# Patient Record
Sex: Male | Born: 1945 | Race: White | Hispanic: No | Marital: Married | Smoking: Former smoker
Health system: Southern US, Community
[De-identification: ages and names within clinical notes are randomized; demographics above are authoritative.]

## PROBLEM LIST (undated history)

## (undated) DIAGNOSIS — M1990 Unspecified osteoarthritis, unspecified site: Secondary | ICD-10-CM

## (undated) DIAGNOSIS — G249 Dystonia, unspecified: Secondary | ICD-10-CM

## (undated) DIAGNOSIS — K2990 Gastroduodenitis, unspecified, without bleeding: Secondary | ICD-10-CM

## (undated) DIAGNOSIS — R972 Elevated prostate specific antigen [PSA]: Secondary | ICD-10-CM

## (undated) DIAGNOSIS — I639 Cerebral infarction, unspecified: Secondary | ICD-10-CM

## (undated) DIAGNOSIS — Z860100 Personal history of colon polyps, unspecified: Secondary | ICD-10-CM

## (undated) DIAGNOSIS — E785 Hyperlipidemia, unspecified: Secondary | ICD-10-CM

## (undated) DIAGNOSIS — R51 Headache: Secondary | ICD-10-CM

## (undated) DIAGNOSIS — I10 Essential (primary) hypertension: Secondary | ICD-10-CM

## (undated) DIAGNOSIS — N10 Acute tubulo-interstitial nephritis: Secondary | ICD-10-CM

## (undated) DIAGNOSIS — F419 Anxiety disorder, unspecified: Secondary | ICD-10-CM

## (undated) DIAGNOSIS — IMO0001 Reserved for inherently not codable concepts without codable children: Secondary | ICD-10-CM

## (undated) DIAGNOSIS — E669 Obesity, unspecified: Secondary | ICD-10-CM

## (undated) DIAGNOSIS — I2510 Atherosclerotic heart disease of native coronary artery without angina pectoris: Secondary | ICD-10-CM

## (undated) DIAGNOSIS — I714 Abdominal aortic aneurysm, without rupture, unspecified: Secondary | ICD-10-CM

## (undated) DIAGNOSIS — D649 Anemia, unspecified: Secondary | ICD-10-CM

## (undated) DIAGNOSIS — K219 Gastro-esophageal reflux disease without esophagitis: Secondary | ICD-10-CM

## (undated) DIAGNOSIS — F4310 Post-traumatic stress disorder, unspecified: Secondary | ICD-10-CM

## (undated) DIAGNOSIS — D696 Thrombocytopenia, unspecified: Secondary | ICD-10-CM

## (undated) DIAGNOSIS — I209 Angina pectoris, unspecified: Secondary | ICD-10-CM

## (undated) DIAGNOSIS — N400 Enlarged prostate without lower urinary tract symptoms: Secondary | ICD-10-CM

## (undated) DIAGNOSIS — A4151 Sepsis due to Escherichia coli [E. coli]: Secondary | ICD-10-CM

## (undated) DIAGNOSIS — C439 Malignant melanoma of skin, unspecified: Secondary | ICD-10-CM

## (undated) DIAGNOSIS — K649 Unspecified hemorrhoids: Secondary | ICD-10-CM

## (undated) DIAGNOSIS — T4145XA Adverse effect of unspecified anesthetic, initial encounter: Secondary | ICD-10-CM

## (undated) DIAGNOSIS — F32A Depression, unspecified: Secondary | ICD-10-CM

## (undated) DIAGNOSIS — K635 Polyp of colon: Secondary | ICD-10-CM

## (undated) DIAGNOSIS — J189 Pneumonia, unspecified organism: Secondary | ICD-10-CM

## (undated) DIAGNOSIS — R569 Unspecified convulsions: Secondary | ICD-10-CM

## (undated) DIAGNOSIS — K269 Duodenal ulcer, unspecified as acute or chronic, without hemorrhage or perforation: Secondary | ICD-10-CM

## (undated) DIAGNOSIS — K760 Fatty (change of) liver, not elsewhere classified: Secondary | ICD-10-CM

## (undated) DIAGNOSIS — Z5189 Encounter for other specified aftercare: Secondary | ICD-10-CM

## (undated) DIAGNOSIS — M79606 Pain in leg, unspecified: Secondary | ICD-10-CM

## (undated) DIAGNOSIS — S0990XA Unspecified injury of head, initial encounter: Secondary | ICD-10-CM

## (undated) DIAGNOSIS — K8070 Calculus of gallbladder and bile duct without cholecystitis without obstruction: Secondary | ICD-10-CM

## (undated) DIAGNOSIS — K648 Other hemorrhoids: Secondary | ICD-10-CM

## (undated) DIAGNOSIS — E538 Deficiency of other specified B group vitamins: Secondary | ICD-10-CM

## (undated) DIAGNOSIS — K449 Diaphragmatic hernia without obstruction or gangrene: Secondary | ICD-10-CM

## (undated) DIAGNOSIS — G459 Transient cerebral ischemic attack, unspecified: Secondary | ICD-10-CM

## (undated) DIAGNOSIS — J449 Chronic obstructive pulmonary disease, unspecified: Secondary | ICD-10-CM

## (undated) DIAGNOSIS — G8929 Other chronic pain: Secondary | ICD-10-CM

## (undated) DIAGNOSIS — Z8709 Personal history of other diseases of the respiratory system: Secondary | ICD-10-CM

## (undated) DIAGNOSIS — F329 Major depressive disorder, single episode, unspecified: Secondary | ICD-10-CM

## (undated) DIAGNOSIS — T8859XA Other complications of anesthesia, initial encounter: Secondary | ICD-10-CM

## (undated) DIAGNOSIS — I6529 Occlusion and stenosis of unspecified carotid artery: Secondary | ICD-10-CM

## (undated) DIAGNOSIS — K589 Irritable bowel syndrome without diarrhea: Secondary | ICD-10-CM

## (undated) HISTORY — DX: Anemia, unspecified: D64.9

## (undated) HISTORY — DX: Elevated prostate specific antigen (PSA): R97.20

## (undated) HISTORY — DX: Personal history of colon polyps, unspecified: Z86.0100

## (undated) HISTORY — DX: Atherosclerotic heart disease of native coronary artery without angina pectoris: I25.10

## (undated) HISTORY — DX: Pain in leg, unspecified: M79.606

## (undated) HISTORY — DX: Unspecified hemorrhoids: K64.9

## (undated) HISTORY — DX: Abdominal aortic aneurysm, without rupture, unspecified: I71.40

## (undated) HISTORY — DX: Benign prostatic hyperplasia without lower urinary tract symptoms: N40.0

## (undated) HISTORY — DX: Post-traumatic stress disorder, unspecified: F43.10

## (undated) HISTORY — PX: LEG SURGERY: SHX1003

## (undated) HISTORY — PX: COLONOSCOPY: SHX174

## (undated) HISTORY — DX: Essential (primary) hypertension: I10

## (undated) HISTORY — DX: Major depressive disorder, single episode, unspecified: F32.9

## (undated) HISTORY — DX: Acute pyelonephritis: N10

## (undated) HISTORY — DX: Unspecified injury of head, initial encounter: S09.90XA

## (undated) HISTORY — PX: OTHER SURGICAL HISTORY: SHX169

## (undated) HISTORY — DX: Sepsis due to Escherichia coli (e. coli): A41.51

## (undated) HISTORY — DX: Duodenal ulcer, unspecified as acute or chronic, without hemorrhage or perforation: K26.9

## (undated) HISTORY — DX: Calculus of gallbladder and bile duct without cholecystitis without obstruction: K80.70

## (undated) HISTORY — DX: Dystonia, unspecified: G24.9

## (undated) HISTORY — DX: Personal history of colonic polyps: Z86.010

## (undated) HISTORY — PX: CARDIAC SURGERY: SHX584

## (undated) HISTORY — DX: Fatty (change of) liver, not elsewhere classified: K76.0

## (undated) HISTORY — DX: Gastroduodenitis, unspecified, without bleeding: K29.90

## (undated) HISTORY — DX: Hyperlipidemia, unspecified: E78.5

## (undated) HISTORY — DX: Other chronic pain: G89.29

## (undated) HISTORY — DX: Cerebral infarction, unspecified: I63.9

## (undated) HISTORY — DX: Gastro-esophageal reflux disease without esophagitis: K21.9

## (undated) HISTORY — DX: Abdominal aortic aneurysm, without rupture: I71.4

## (undated) HISTORY — DX: Depression, unspecified: F32.A

## (undated) HISTORY — DX: Polyp of colon: K63.5

## (undated) HISTORY — DX: Other hemorrhoids: K64.8

## (undated) HISTORY — DX: Irritable bowel syndrome without diarrhea: K58.9

## (undated) HISTORY — DX: Diaphragmatic hernia without obstruction or gangrene: K44.9

## (undated) HISTORY — DX: Deficiency of other specified B group vitamins: E53.8

## (undated) HISTORY — PX: SKIN CANCER EXCISION: SHX779

## (undated) HISTORY — DX: Occlusion and stenosis of unspecified carotid artery: I65.29

## (undated) HISTORY — PX: ESOPHAGOGASTRODUODENOSCOPY: SHX1529

## (undated) HISTORY — DX: Obesity, unspecified: E66.9

## (undated) HISTORY — DX: Transient cerebral ischemic attack, unspecified: G45.9

## (undated) HISTORY — PX: CHOLECYSTECTOMY: SHX55

## (undated) HISTORY — DX: Malignant melanoma of skin, unspecified: C43.9

## (undated) HISTORY — DX: Thrombocytopenia, unspecified: D69.6

## (undated) HISTORY — DX: Unspecified convulsions: R56.9

## (undated) HISTORY — DX: Chronic obstructive pulmonary disease, unspecified: J44.9

---

## 1964-10-04 DIAGNOSIS — S0990XA Unspecified injury of head, initial encounter: Secondary | ICD-10-CM

## 1964-10-04 HISTORY — DX: Unspecified injury of head, initial encounter: S09.90XA

## 1998-07-25 ENCOUNTER — Emergency Department (HOSPITAL_COMMUNITY): Admission: EM | Admit: 1998-07-25 | Discharge: 1998-07-25 | Payer: Self-pay | Admitting: Emergency Medicine

## 1998-07-25 ENCOUNTER — Encounter: Payer: Self-pay | Admitting: Emergency Medicine

## 1999-01-25 ENCOUNTER — Emergency Department (HOSPITAL_COMMUNITY): Admission: EM | Admit: 1999-01-25 | Discharge: 1999-01-25 | Payer: Self-pay

## 1999-01-29 ENCOUNTER — Inpatient Hospital Stay (HOSPITAL_COMMUNITY): Admission: AD | Admit: 1999-01-29 | Discharge: 1999-02-03 | Payer: Self-pay | Admitting: Internal Medicine

## 1999-01-30 ENCOUNTER — Encounter: Payer: Self-pay | Admitting: Internal Medicine

## 1999-01-30 HISTORY — PX: ERCP: SHX60

## 1999-04-22 ENCOUNTER — Ambulatory Visit (HOSPITAL_COMMUNITY): Admission: RE | Admit: 1999-04-22 | Discharge: 1999-04-22 | Payer: Self-pay | Admitting: Neurosurgery

## 1999-05-12 ENCOUNTER — Inpatient Hospital Stay (HOSPITAL_COMMUNITY): Admission: RE | Admit: 1999-05-12 | Discharge: 1999-05-13 | Payer: Self-pay | Admitting: Neurosurgery

## 1999-07-08 ENCOUNTER — Emergency Department (HOSPITAL_COMMUNITY): Admission: EM | Admit: 1999-07-08 | Discharge: 1999-07-08 | Payer: Self-pay | Admitting: Emergency Medicine

## 1999-07-08 ENCOUNTER — Encounter: Payer: Self-pay | Admitting: Emergency Medicine

## 2003-09-27 ENCOUNTER — Inpatient Hospital Stay (HOSPITAL_COMMUNITY): Admission: EM | Admit: 2003-09-27 | Discharge: 2003-10-02 | Payer: Self-pay | Admitting: Emergency Medicine

## 2003-10-01 ENCOUNTER — Encounter (INDEPENDENT_AMBULATORY_CARE_PROVIDER_SITE_OTHER): Payer: Self-pay | Admitting: Cardiology

## 2003-10-23 ENCOUNTER — Inpatient Hospital Stay (HOSPITAL_COMMUNITY): Admission: EM | Admit: 2003-10-23 | Discharge: 2003-10-24 | Payer: Self-pay | Admitting: Emergency Medicine

## 2003-10-31 ENCOUNTER — Ambulatory Visit (HOSPITAL_COMMUNITY): Admission: RE | Admit: 2003-10-31 | Discharge: 2003-10-31 | Payer: Self-pay | Admitting: *Deleted

## 2004-01-28 ENCOUNTER — Inpatient Hospital Stay (HOSPITAL_COMMUNITY): Admission: EM | Admit: 2004-01-28 | Discharge: 2004-01-29 | Payer: Self-pay | Admitting: Emergency Medicine

## 2004-05-23 ENCOUNTER — Emergency Department (HOSPITAL_COMMUNITY): Admission: EM | Admit: 2004-05-23 | Discharge: 2004-05-24 | Payer: Self-pay | Admitting: Emergency Medicine

## 2005-06-04 ENCOUNTER — Emergency Department (HOSPITAL_COMMUNITY): Admission: EM | Admit: 2005-06-04 | Discharge: 2005-06-04 | Payer: Self-pay | Admitting: Emergency Medicine

## 2005-09-02 ENCOUNTER — Emergency Department (HOSPITAL_COMMUNITY): Admission: EM | Admit: 2005-09-02 | Discharge: 2005-09-02 | Payer: Self-pay | Admitting: Emergency Medicine

## 2006-05-24 ENCOUNTER — Ambulatory Visit: Payer: Self-pay | Admitting: Family Medicine

## 2006-05-24 ENCOUNTER — Inpatient Hospital Stay (HOSPITAL_COMMUNITY): Admission: EM | Admit: 2006-05-24 | Discharge: 2006-05-26 | Payer: Self-pay | Admitting: Emergency Medicine

## 2006-05-24 ENCOUNTER — Ambulatory Visit: Payer: Self-pay | Admitting: Cardiovascular Disease

## 2006-05-25 ENCOUNTER — Encounter: Payer: Self-pay | Admitting: Cardiology

## 2007-05-21 ENCOUNTER — Emergency Department (HOSPITAL_COMMUNITY): Admission: EM | Admit: 2007-05-21 | Discharge: 2007-05-21 | Payer: Self-pay | Admitting: Emergency Medicine

## 2007-06-11 ENCOUNTER — Ambulatory Visit: Payer: Self-pay | Admitting: Cardiology

## 2007-06-12 ENCOUNTER — Ambulatory Visit: Payer: Self-pay | Admitting: Cardiology

## 2007-06-12 ENCOUNTER — Inpatient Hospital Stay (HOSPITAL_COMMUNITY): Admission: EM | Admit: 2007-06-12 | Discharge: 2007-06-16 | Payer: Self-pay | Admitting: Emergency Medicine

## 2007-06-13 ENCOUNTER — Encounter (INDEPENDENT_AMBULATORY_CARE_PROVIDER_SITE_OTHER): Payer: Self-pay | Admitting: Internal Medicine

## 2007-06-22 ENCOUNTER — Ambulatory Visit: Payer: Self-pay | Admitting: Family Medicine

## 2007-06-22 DIAGNOSIS — K7689 Other specified diseases of liver: Secondary | ICD-10-CM | POA: Insufficient documentation

## 2007-06-22 DIAGNOSIS — I10 Essential (primary) hypertension: Secondary | ICD-10-CM | POA: Insufficient documentation

## 2007-06-22 DIAGNOSIS — S0990XA Unspecified injury of head, initial encounter: Secondary | ICD-10-CM | POA: Insufficient documentation

## 2007-06-22 DIAGNOSIS — Z87440 Personal history of urinary (tract) infections: Secondary | ICD-10-CM | POA: Insufficient documentation

## 2007-06-22 DIAGNOSIS — E785 Hyperlipidemia, unspecified: Secondary | ICD-10-CM | POA: Insufficient documentation

## 2007-06-22 DIAGNOSIS — I714 Abdominal aortic aneurysm, without rupture, unspecified: Secondary | ICD-10-CM | POA: Insufficient documentation

## 2007-06-22 DIAGNOSIS — Z8679 Personal history of other diseases of the circulatory system: Secondary | ICD-10-CM | POA: Insufficient documentation

## 2007-06-22 DIAGNOSIS — K219 Gastro-esophageal reflux disease without esophagitis: Secondary | ICD-10-CM

## 2007-06-22 DIAGNOSIS — D696 Thrombocytopenia, unspecified: Secondary | ICD-10-CM

## 2007-06-22 DIAGNOSIS — R972 Elevated prostate specific antigen [PSA]: Secondary | ICD-10-CM

## 2007-06-27 ENCOUNTER — Encounter: Payer: Self-pay | Admitting: Internal Medicine

## 2007-06-29 LAB — CONVERTED CEMR LAB
Basophils Relative: 0.9 % (ref 0.0–1.0)
Bilirubin, Direct: 0.1 mg/dL (ref 0.0–0.3)
CO2: 28 meq/L (ref 19–32)
Eosinophils Absolute: 0.1 10*3/uL (ref 0.0–0.6)
Eosinophils Relative: 0.9 % (ref 0.0–5.0)
GFR calc Af Amer: 66 mL/min
GFR calc non Af Amer: 55 mL/min
Glucose, Bld: 74 mg/dL (ref 70–99)
Hemoglobin: 11.5 g/dL — ABNORMAL LOW (ref 13.0–17.0)
Lymphocytes Relative: 18.8 % (ref 12.0–46.0)
MCV: 88.8 fL (ref 78.0–100.0)
Monocytes Absolute: 0.4 10*3/uL (ref 0.2–0.7)
Monocytes Relative: 3.6 % (ref 3.0–11.0)
Neutro Abs: 8.4 10*3/uL — ABNORMAL HIGH (ref 1.4–7.7)
Platelets: 362 10*3/uL (ref 150–400)
Potassium: 5.1 meq/L (ref 3.5–5.1)
Sodium: 144 meq/L (ref 135–145)
Total Protein: 6.3 g/dL (ref 6.0–8.3)
WBC: 11.1 10*3/uL — ABNORMAL HIGH (ref 4.5–10.5)

## 2007-06-30 ENCOUNTER — Encounter: Admission: RE | Admit: 2007-06-30 | Discharge: 2007-06-30 | Payer: Self-pay | Admitting: Family Medicine

## 2007-07-04 ENCOUNTER — Encounter (INDEPENDENT_AMBULATORY_CARE_PROVIDER_SITE_OTHER): Payer: Self-pay | Admitting: *Deleted

## 2007-07-04 ENCOUNTER — Ambulatory Visit: Payer: Self-pay | Admitting: Cardiology

## 2007-07-05 ENCOUNTER — Telehealth: Payer: Self-pay | Admitting: Family Medicine

## 2007-08-07 ENCOUNTER — Ambulatory Visit: Payer: Self-pay

## 2007-08-07 ENCOUNTER — Encounter: Payer: Self-pay | Admitting: Family Medicine

## 2008-02-24 ENCOUNTER — Observation Stay (HOSPITAL_COMMUNITY): Admission: EM | Admit: 2008-02-24 | Discharge: 2008-02-25 | Payer: Self-pay | Admitting: Emergency Medicine

## 2008-02-24 ENCOUNTER — Ambulatory Visit: Payer: Self-pay | Admitting: Internal Medicine

## 2008-02-24 DIAGNOSIS — Z860100 Personal history of colon polyps, unspecified: Secondary | ICD-10-CM | POA: Insufficient documentation

## 2008-02-24 DIAGNOSIS — D500 Iron deficiency anemia secondary to blood loss (chronic): Secondary | ICD-10-CM | POA: Insufficient documentation

## 2008-02-24 DIAGNOSIS — R5383 Other fatigue: Secondary | ICD-10-CM

## 2008-02-24 DIAGNOSIS — R5381 Other malaise: Secondary | ICD-10-CM | POA: Insufficient documentation

## 2008-02-24 DIAGNOSIS — F4310 Post-traumatic stress disorder, unspecified: Secondary | ICD-10-CM

## 2008-02-24 DIAGNOSIS — R269 Unspecified abnormalities of gait and mobility: Secondary | ICD-10-CM

## 2008-02-25 ENCOUNTER — Encounter: Payer: Self-pay | Admitting: Internal Medicine

## 2008-02-27 ENCOUNTER — Ambulatory Visit: Payer: Self-pay | Admitting: Internal Medicine

## 2008-02-28 ENCOUNTER — Encounter: Payer: Self-pay | Admitting: Internal Medicine

## 2008-03-01 ENCOUNTER — Ambulatory Visit: Payer: Self-pay | Admitting: Internal Medicine

## 2008-03-05 ENCOUNTER — Ambulatory Visit: Payer: Self-pay | Admitting: Internal Medicine

## 2008-03-05 ENCOUNTER — Encounter: Payer: Self-pay | Admitting: Internal Medicine

## 2008-03-10 ENCOUNTER — Encounter: Payer: Self-pay | Admitting: Internal Medicine

## 2008-04-02 ENCOUNTER — Ambulatory Visit: Payer: Self-pay | Admitting: Family Medicine

## 2008-04-02 DIAGNOSIS — D649 Anemia, unspecified: Secondary | ICD-10-CM

## 2008-04-11 ENCOUNTER — Encounter (INDEPENDENT_AMBULATORY_CARE_PROVIDER_SITE_OTHER): Payer: Self-pay | Admitting: *Deleted

## 2008-04-11 LAB — CONVERTED CEMR LAB
BUN: 17 mg/dL (ref 6–23)
Basophils Relative: 1.3 % — ABNORMAL HIGH (ref 0.0–1.0)
CO2: 26 meq/L (ref 19–32)
Chloride: 105 meq/L (ref 96–112)
Eosinophils Relative: 2.8 % (ref 0.0–5.0)
Ferritin: 30 ng/mL (ref 22.0–322.0)
GFR calc non Af Amer: 54 mL/min
HCT: 38.8 % — ABNORMAL LOW (ref 39.0–52.0)
Hemoglobin: 12.8 g/dL — ABNORMAL LOW (ref 13.0–17.0)
Lymphocytes Relative: 24.4 % (ref 12.0–46.0)
Monocytes Absolute: 0.6 10*3/uL (ref 0.1–1.0)
Monocytes Relative: 7.2 % (ref 3.0–12.0)
Neutro Abs: 5.8 10*3/uL (ref 1.4–7.7)
PSA: 1.27 ng/mL (ref 0.10–4.00)
Potassium: 4.6 meq/L (ref 3.5–5.1)
RBC: 5 M/uL (ref 4.22–5.81)
RDW: 29.9 % — ABNORMAL HIGH (ref 11.5–14.6)
Saturation Ratios: 24.4 % (ref 20.0–50.0)
Vitamin B-12: 258 pg/mL (ref 211–911)
WBC: 8.9 10*3/uL (ref 4.5–10.5)

## 2008-04-16 ENCOUNTER — Telehealth (INDEPENDENT_AMBULATORY_CARE_PROVIDER_SITE_OTHER): Payer: Self-pay | Admitting: *Deleted

## 2008-04-17 ENCOUNTER — Telehealth (INDEPENDENT_AMBULATORY_CARE_PROVIDER_SITE_OTHER): Payer: Self-pay | Admitting: *Deleted

## 2008-04-18 ENCOUNTER — Ambulatory Visit: Payer: Self-pay | Admitting: Family Medicine

## 2008-04-30 ENCOUNTER — Telehealth (INDEPENDENT_AMBULATORY_CARE_PROVIDER_SITE_OTHER): Payer: Self-pay | Admitting: *Deleted

## 2008-04-30 LAB — CONVERTED CEMR LAB
ALT: 17 units/L (ref 0–53)
AST: 16 units/L (ref 0–37)
Alkaline Phosphatase: 61 units/L (ref 39–117)
Basophils Absolute: 0 10*3/uL (ref 0.0–0.1)
Basophils Relative: 0.1 % (ref 0.0–3.0)
Bilirubin, Direct: 0.1 mg/dL (ref 0.0–0.3)
CO2: 27 meq/L (ref 19–32)
Chloride: 98 meq/L (ref 96–112)
Lymphocytes Relative: 10.8 % — ABNORMAL LOW (ref 12.0–46.0)
MCHC: 33.3 g/dL (ref 30.0–36.0)
Monocytes Relative: 7.8 % (ref 3.0–12.0)
Neutrophils Relative %: 79.2 % — ABNORMAL HIGH (ref 43.0–77.0)
RBC: 4.61 M/uL (ref 4.22–5.81)
RDW: 28.2 % — ABNORMAL HIGH (ref 11.5–14.6)
Sodium: 134 meq/L — ABNORMAL LOW (ref 135–145)
Total Bilirubin: 0.5 mg/dL (ref 0.3–1.2)

## 2008-05-16 ENCOUNTER — Ambulatory Visit: Payer: Self-pay | Admitting: Family Medicine

## 2008-05-17 ENCOUNTER — Encounter (INDEPENDENT_AMBULATORY_CARE_PROVIDER_SITE_OTHER): Payer: Self-pay | Admitting: *Deleted

## 2008-05-17 ENCOUNTER — Ambulatory Visit: Payer: Self-pay | Admitting: Family Medicine

## 2008-05-17 DIAGNOSIS — E1169 Type 2 diabetes mellitus with other specified complication: Secondary | ICD-10-CM | POA: Insufficient documentation

## 2008-05-17 DIAGNOSIS — E119 Type 2 diabetes mellitus without complications: Secondary | ICD-10-CM | POA: Insufficient documentation

## 2008-05-17 LAB — CONVERTED CEMR LAB
BUN: 12 mg/dL (ref 6–23)
Chloride: 107 meq/L (ref 96–112)
Eosinophils Relative: 3 % (ref 0.0–5.0)
GFR calc non Af Amer: 65 mL/min
Glucose, Bld: 240 mg/dL — ABNORMAL HIGH (ref 70–99)
HCT: 38.9 % — ABNORMAL LOW (ref 39.0–52.0)
MCHC: 33.1 g/dL (ref 30.0–36.0)
MCV: 83.7 fL (ref 78.0–100.0)
Platelets: 201 10*3/uL (ref 150–400)
Potassium: 4 meq/L (ref 3.5–5.1)
RDW: 23.8 % — ABNORMAL HIGH (ref 11.5–14.6)

## 2008-05-20 ENCOUNTER — Encounter (INDEPENDENT_AMBULATORY_CARE_PROVIDER_SITE_OTHER): Payer: Self-pay | Admitting: *Deleted

## 2008-05-20 ENCOUNTER — Telehealth (INDEPENDENT_AMBULATORY_CARE_PROVIDER_SITE_OTHER): Payer: Self-pay | Admitting: *Deleted

## 2008-05-21 ENCOUNTER — Encounter: Payer: Self-pay | Admitting: Family Medicine

## 2008-06-24 ENCOUNTER — Ambulatory Visit: Payer: Self-pay | Admitting: Internal Medicine

## 2008-07-08 ENCOUNTER — Encounter: Payer: Self-pay | Admitting: Family Medicine

## 2008-10-04 DIAGNOSIS — K269 Duodenal ulcer, unspecified as acute or chronic, without hemorrhage or perforation: Secondary | ICD-10-CM

## 2008-10-04 HISTORY — DX: Duodenal ulcer, unspecified as acute or chronic, without hemorrhage or perforation: K26.9

## 2008-10-14 ENCOUNTER — Ambulatory Visit: Payer: Self-pay | Admitting: Internal Medicine

## 2008-10-14 ENCOUNTER — Inpatient Hospital Stay (HOSPITAL_COMMUNITY): Admission: EM | Admit: 2008-10-14 | Discharge: 2008-10-17 | Payer: Self-pay | Admitting: Emergency Medicine

## 2008-10-14 ENCOUNTER — Ambulatory Visit: Payer: Self-pay | Admitting: Cardiology

## 2008-10-15 ENCOUNTER — Ambulatory Visit: Payer: Self-pay | Admitting: Internal Medicine

## 2008-10-15 ENCOUNTER — Encounter: Payer: Self-pay | Admitting: Internal Medicine

## 2008-10-16 ENCOUNTER — Ambulatory Visit: Payer: Self-pay | Admitting: Vascular Surgery

## 2008-10-16 ENCOUNTER — Encounter: Payer: Self-pay | Admitting: Internal Medicine

## 2008-10-21 ENCOUNTER — Telehealth: Payer: Self-pay | Admitting: Family Medicine

## 2008-10-22 IMAGING — CT CT HEAD W/O CM
1 of 2 series · 13 of 30 positions shown, 17 images · IV contrast (agent unspecified)
Comparison: 05/24/06.

CLINICAL DATA: Short of breath.  Fever.  Headaches. 
 HEAD CT WITHOUT CONTRAST:
TECHNIQUE: Contiguous axial images were obtained from the base of the skull through the vertex according to standard protocol without contrast.

[Series 2: brain · axial · 0.47mm/px · z∈[+177,+315]mm · 13 of 44 slices shown, 17 images]
[im 4/44  brain]
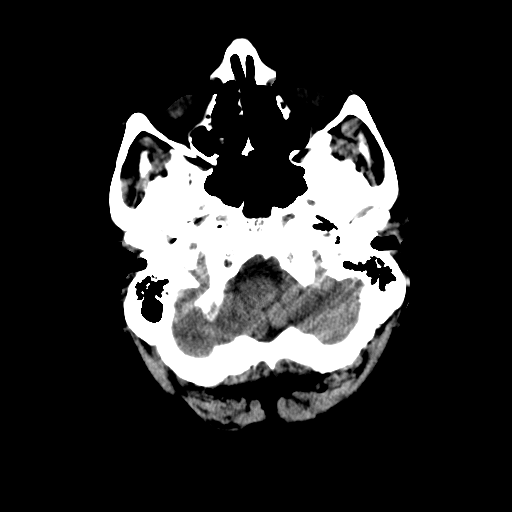
[im 4/44  bone]
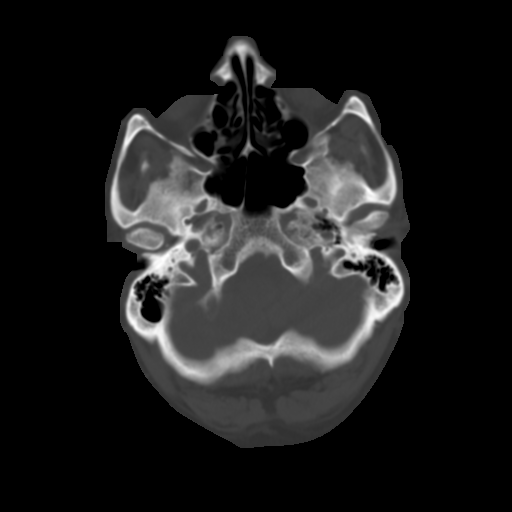
[im 7/44  brain]
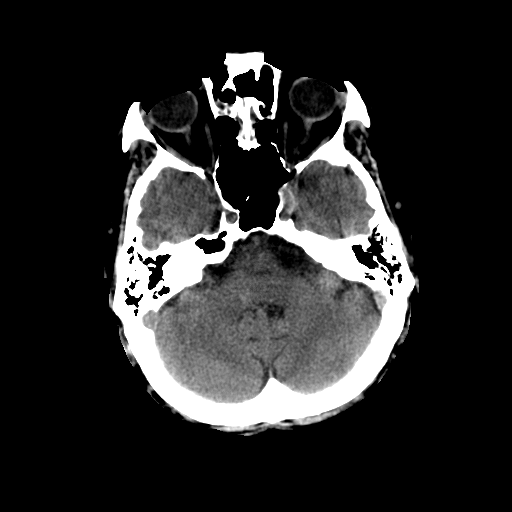
[im 10/44  brain]
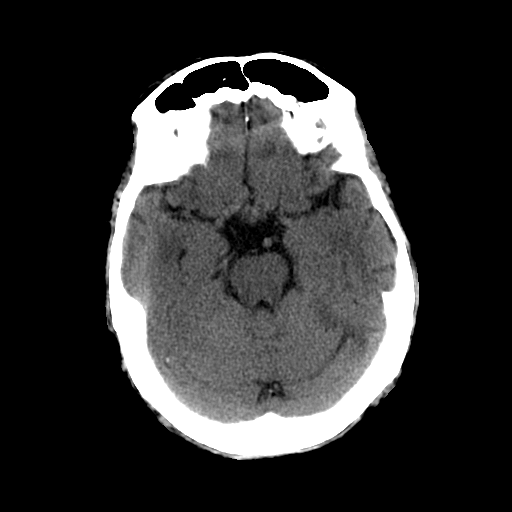
[im 13/44  brain]
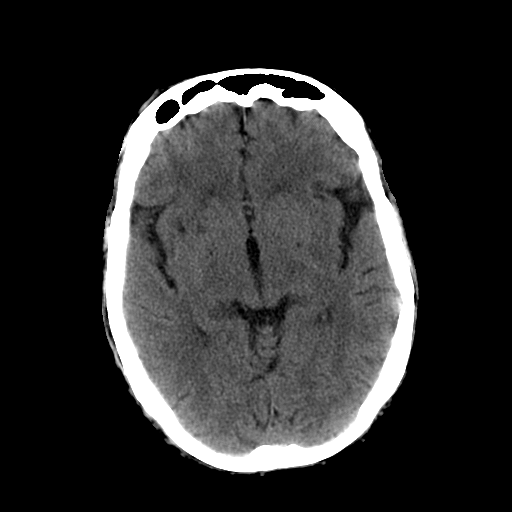
[im 16/44  brain]
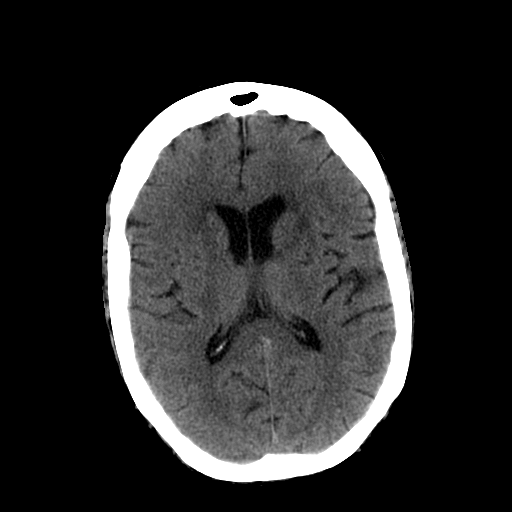
[im 16/44  bone]
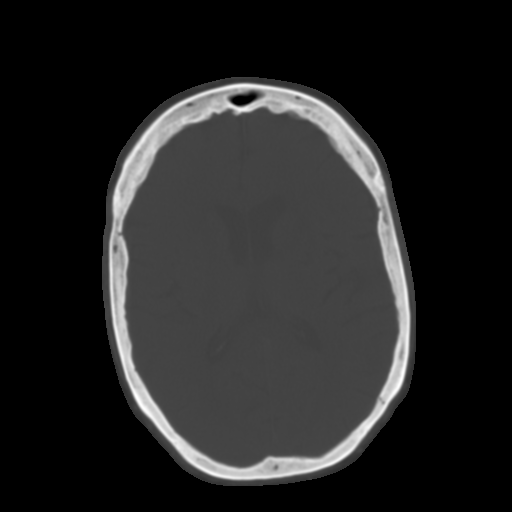
[im 19/44  brain]
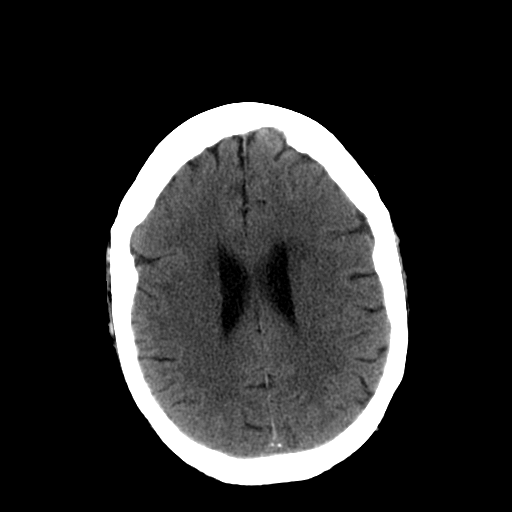
[im 22/44  brain]
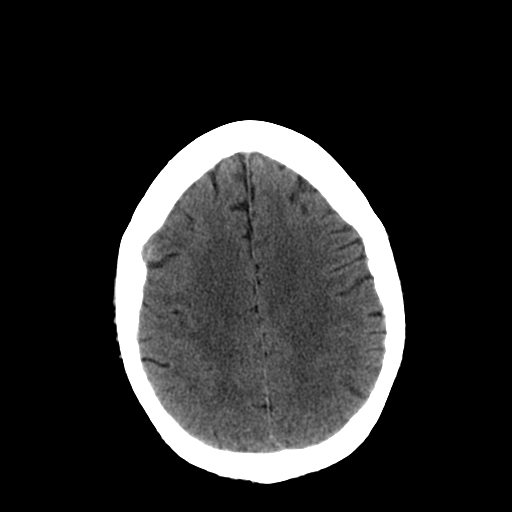
[im 25/44  brain]
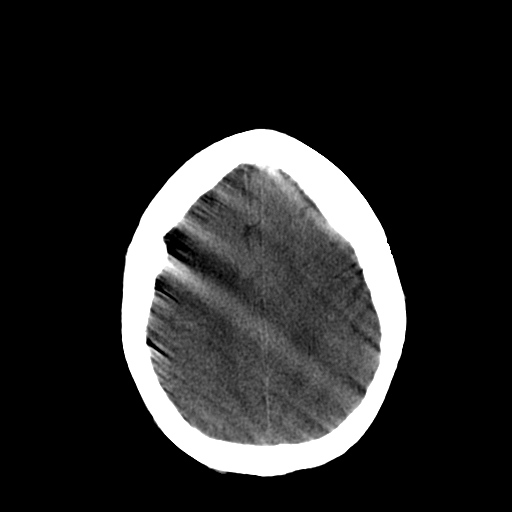
[im 28/44  brain]
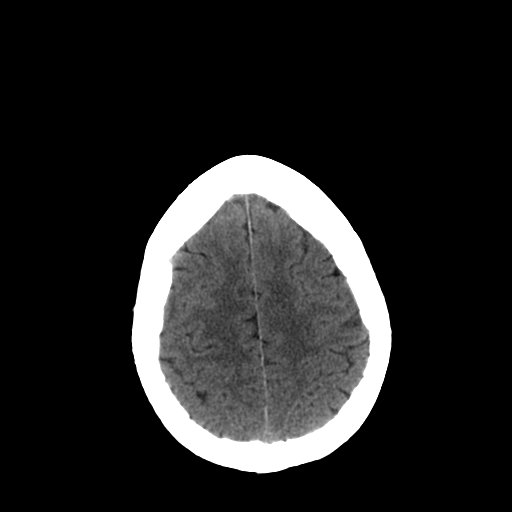
[im 28/44  bone]
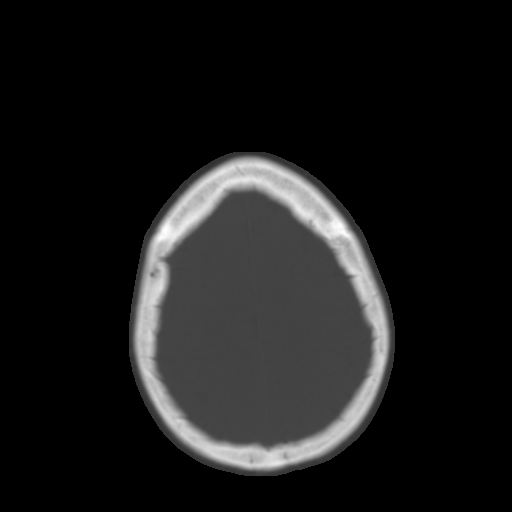
[im 31/44  brain]
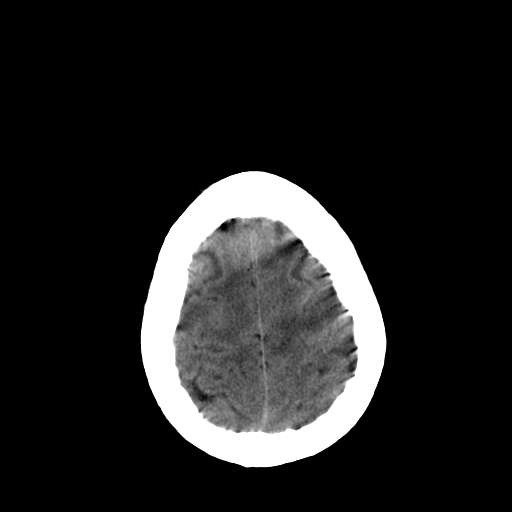
[im 34/44  brain]
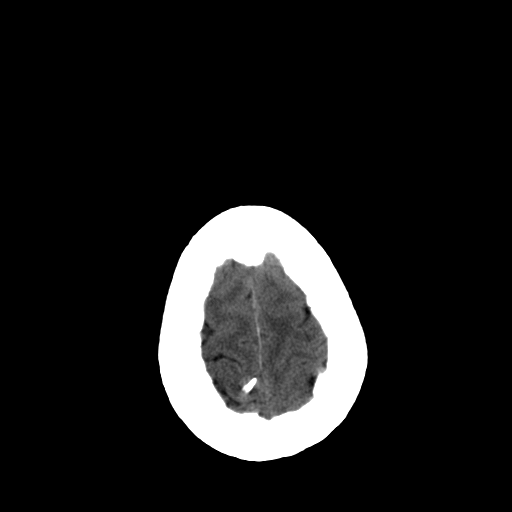
[im 37/44  brain]
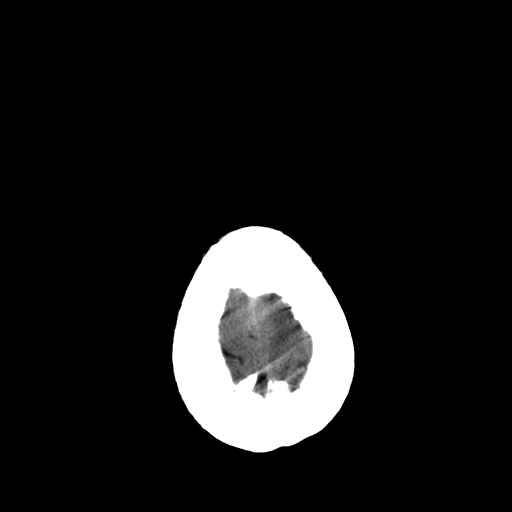
[im 40/44  brain]
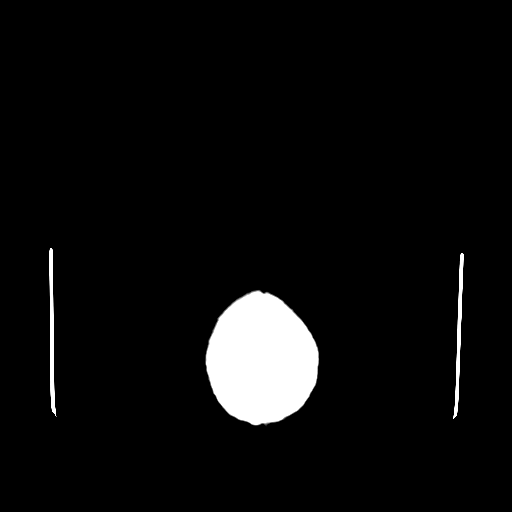
[im 40/44  bone]
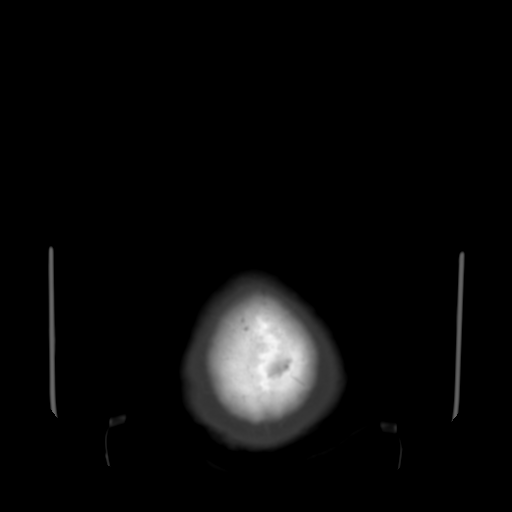

[13 of 30 positions shown; findings below may reference images not displayed]

FINDINGS: There is no sign of acute stroke, mass, hemorrhage, hydrocephalus, or extraaxial collection.  Extensive chronic small vessel changes are seen within the deep white matter and basal ganglia.  The calvarium is unremarkable.  There is minor inflammatory disease in the ethmoid sinuses.
IMPRESSION: Chronic small vessel changes.  No acute findings.

## 2008-10-28 ENCOUNTER — Ambulatory Visit: Payer: Self-pay | Admitting: Family Medicine

## 2008-10-28 DIAGNOSIS — I6350 Cerebral infarction due to unspecified occlusion or stenosis of unspecified cerebral artery: Secondary | ICD-10-CM | POA: Insufficient documentation

## 2008-11-12 LAB — CONVERTED CEMR LAB
ALT: 13 units/L (ref 0–53)
AST: 13 units/L (ref 0–37)
Albumin: 4 g/dL (ref 3.5–5.2)
Alkaline Phosphatase: 61 units/L (ref 39–117)
Basophils Absolute: 0.1 10*3/uL (ref 0.0–0.1)
Bilirubin, Direct: 0.1 mg/dL (ref 0.0–0.3)
Calcium: 9.3 mg/dL (ref 8.4–10.5)
Chloride: 108 meq/L (ref 96–112)
Creatinine, Ser: 1.1 mg/dL (ref 0.4–1.5)
Eosinophils Absolute: 0.2 10*3/uL (ref 0.0–0.7)
Folate: 10.8 ng/mL
HCT: 35.7 % — ABNORMAL LOW (ref 39.0–52.0)
HDL: 31.1 mg/dL — ABNORMAL LOW (ref >39.0)
Monocytes Absolute: 0.6 10*3/uL (ref 0.1–1.0)
Monocytes Relative: 9.6 % (ref 3.0–12.0)
Platelets: 208 10*3/uL (ref 150–400)
RDW: 16.3 % — ABNORMAL HIGH (ref 11.5–14.6)
Total CHOL/HDL Ratio: 5.9
Total Protein: 6.4 g/dL (ref 6.0–8.3)
Triglycerides: 173 mg/dL — ABNORMAL HIGH (ref 0–149)
Vitamin B-12: 409 pg/mL (ref 211–911)

## 2008-11-14 ENCOUNTER — Telehealth (INDEPENDENT_AMBULATORY_CARE_PROVIDER_SITE_OTHER): Payer: Self-pay | Admitting: *Deleted

## 2008-11-14 ENCOUNTER — Encounter (INDEPENDENT_AMBULATORY_CARE_PROVIDER_SITE_OTHER): Payer: Self-pay | Admitting: *Deleted

## 2008-11-18 ENCOUNTER — Telehealth (INDEPENDENT_AMBULATORY_CARE_PROVIDER_SITE_OTHER): Payer: Self-pay | Admitting: *Deleted

## 2010-06-14 ENCOUNTER — Emergency Department (HOSPITAL_COMMUNITY)
Admission: EM | Admit: 2010-06-14 | Discharge: 2010-06-14 | Payer: Self-pay | Source: Home / Self Care | Admitting: Emergency Medicine

## 2010-11-03 NOTE — Progress Notes (Signed)
Summary: lab results  Phone Note Outgoing Call   Details for Reason: LAB RESULTS con't iron ---slightly anemic BS is elevated---watch simple sugars and starches----repeat soon fasting-- cbcd-bmp, hgba1c 790.6 Signed by Loreen Freud DO on 04/29/2008 at 9:30 PM  Summary of Call: copy of labs mailed to patient discussed with patient repeat lab appt made for patient............Marland KitchenShary Decamp  April 30, 2008 10:28 AM

## 2010-12-03 HISTORY — PX: CORONARY ARTERY BYPASS GRAFT: SHX141

## 2010-12-17 ENCOUNTER — Emergency Department (HOSPITAL_COMMUNITY): Payer: Medicare Other

## 2010-12-17 ENCOUNTER — Inpatient Hospital Stay (HOSPITAL_COMMUNITY)
Admission: EM | Admit: 2010-12-17 | Discharge: 2010-12-22 | DRG: 287 | Disposition: A | Discharge status: Home / Self Care | Payer: Medicare Other | Attending: Cardiology | Admitting: Cardiology

## 2010-12-17 DIAGNOSIS — I658 Occlusion and stenosis of other precerebral arteries: Secondary | ICD-10-CM | POA: Diagnosis present

## 2010-12-17 DIAGNOSIS — F339 Major depressive disorder, recurrent, unspecified: Secondary | ICD-10-CM | POA: Diagnosis present

## 2010-12-17 DIAGNOSIS — F411 Generalized anxiety disorder: Secondary | ICD-10-CM | POA: Diagnosis present

## 2010-12-17 DIAGNOSIS — F4310 Post-traumatic stress disorder, unspecified: Secondary | ICD-10-CM | POA: Diagnosis present

## 2010-12-17 DIAGNOSIS — Z7982 Long term (current) use of aspirin: Secondary | ICD-10-CM

## 2010-12-17 DIAGNOSIS — Z794 Long term (current) use of insulin: Secondary | ICD-10-CM

## 2010-12-17 DIAGNOSIS — E785 Hyperlipidemia, unspecified: Secondary | ICD-10-CM | POA: Diagnosis present

## 2010-12-17 DIAGNOSIS — E119 Type 2 diabetes mellitus without complications: Secondary | ICD-10-CM | POA: Diagnosis present

## 2010-12-17 DIAGNOSIS — I10 Essential (primary) hypertension: Secondary | ICD-10-CM | POA: Diagnosis present

## 2010-12-17 DIAGNOSIS — I672 Cerebral atherosclerosis: Secondary | ICD-10-CM | POA: Diagnosis present

## 2010-12-17 DIAGNOSIS — I2510 Atherosclerotic heart disease of native coronary artery without angina pectoris: Principal | ICD-10-CM | POA: Diagnosis present

## 2010-12-17 DIAGNOSIS — I200 Unstable angina: Secondary | ICD-10-CM | POA: Diagnosis present

## 2010-12-17 DIAGNOSIS — R079 Chest pain, unspecified: Secondary | ICD-10-CM

## 2010-12-17 DIAGNOSIS — I959 Hypotension, unspecified: Secondary | ICD-10-CM | POA: Diagnosis present

## 2010-12-17 DIAGNOSIS — F29 Unspecified psychosis not due to a substance or known physiological condition: Secondary | ICD-10-CM | POA: Diagnosis present

## 2010-12-17 DIAGNOSIS — Z8673 Personal history of transient ischemic attack (TIA), and cerebral infarction without residual deficits: Secondary | ICD-10-CM

## 2010-12-17 DIAGNOSIS — Z23 Encounter for immunization: Secondary | ICD-10-CM

## 2010-12-17 DIAGNOSIS — I6529 Occlusion and stenosis of unspecified carotid artery: Secondary | ICD-10-CM | POA: Diagnosis present

## 2010-12-17 LAB — POCT I-STAT, CHEM 8
BUN: 16 mg/dL (ref 6–23)
BUN: 18 mg/dL (ref 6–23)
Calcium, Ion: 1 mmol/L — ABNORMAL LOW (ref 1.12–1.32)
Creatinine, Ser: 1.6 mg/dL — ABNORMAL HIGH (ref 0.4–1.5)
Creatinine, Ser: 1.6 mg/dL — ABNORMAL HIGH (ref 0.4–1.5)
Glucose, Bld: 195 mg/dL — ABNORMAL HIGH (ref 70–99)
Glucose, Bld: 157 mg/dL — ABNORMAL HIGH (ref 70–99)
Potassium: 3.8 mEq/L (ref 3.5–5.1)
Sodium: 139 mEq/L (ref 135–145)
TCO2: 19 mmol/L (ref 0–100)
TCO2: 23 mmol/L (ref 0–100)

## 2010-12-17 LAB — CBC
HCT: 40.1 % (ref 39.0–52.0)
Hemoglobin: 13.6 g/dL (ref 13.0–17.0)
MCH: 30.5 pg (ref 26.0–34.0)
RBC: 4.46 MIL/uL (ref 4.22–5.81)

## 2010-12-17 LAB — URINALYSIS, ROUTINE W REFLEX MICROSCOPIC
Leukocytes, UA: NEGATIVE
Protein, ur: 100 mg/dL — AB
Urobilinogen, UA: 1 mg/dL (ref 0.0–1.0)

## 2010-12-17 LAB — DIFFERENTIAL
Basophils Relative: 0 % (ref 0–1)
Lymphocytes Relative: 22 % (ref 12–46)
Monocytes Relative: 9 % (ref 3–12)
Neutro Abs: 4.9 10*3/uL (ref 1.7–7.7)
Neutrophils Relative %: 66 % (ref 43–77)

## 2010-12-17 LAB — POCT CARDIAC MARKERS
CKMB, poc: 1.3 ng/mL (ref 1.0–8.0)
Myoglobin, poc: 101 ng/mL (ref 12–200)
Myoglobin, poc: 101 ng/mL (ref 12–200)

## 2010-12-17 LAB — URINE MICROSCOPIC-ADD ON

## 2010-12-18 DIAGNOSIS — I2510 Atherosclerotic heart disease of native coronary artery without angina pectoris: Secondary | ICD-10-CM

## 2010-12-18 LAB — GLUCOSE, CAPILLARY
Glucose-Capillary: 120 mg/dL — ABNORMAL HIGH (ref 70–99)
Glucose-Capillary: 110 mg/dL — ABNORMAL HIGH (ref 70–99)
Glucose-Capillary: 110 mg/dL — ABNORMAL HIGH (ref 70–99)
Glucose-Capillary: 132 mg/dL — ABNORMAL HIGH (ref 70–99)

## 2010-12-18 LAB — MRSA PCR SCREENING: MRSA by PCR: NEGATIVE

## 2010-12-18 LAB — CK TOTAL AND CKMB (NOT AT ARMC)
CK, MB: 2.3 ng/mL (ref 0.3–4.0)
Relative Index: INVALID (ref 0.0–2.5)
Total CK: 47 U/L (ref 7–232)

## 2010-12-18 LAB — COMPREHENSIVE METABOLIC PANEL
AST: 14 U/L (ref 0–37)
Albumin: 3.5 g/dL (ref 3.5–5.2)
Calcium: 8.3 mg/dL — ABNORMAL LOW (ref 8.4–10.5)
Creatinine, Ser: 1.36 mg/dL (ref 0.4–1.5)
GFR calc Af Amer: >60 mL/min (ref >60)
GFR calc non Af Amer: 52 mL/min — ABNORMAL LOW (ref >60)

## 2010-12-18 LAB — CARDIAC PANEL(CRET KIN+CKTOT+MB+TROPI)
CK, MB: 2.1 ng/mL (ref 0.3–4.0)
CK, MB: 1.8 ng/mL (ref 0.3–4.0)
Relative Index: INVALID (ref 0.0–2.5)

## 2010-12-18 LAB — LIPID PANEL
HDL: 28 mg/dL — ABNORMAL LOW (ref >39)
LDL Cholesterol: 48 mg/dL (ref 0–99)
Triglycerides: 154 mg/dL — ABNORMAL HIGH (ref <150)

## 2010-12-18 LAB — CBC
MCHC: 34.7 g/dL (ref 30.0–36.0)
MCV: 91.2 fL (ref 78.0–100.0)
Platelets: 146 10*3/uL — ABNORMAL LOW (ref 150–400)
RDW: 12.8 % (ref 11.5–15.5)
WBC: 7.2 10*3/uL (ref 4.0–10.5)

## 2010-12-18 LAB — PROTIME-INR: INR: 1.1 (ref 0.00–1.49)

## 2010-12-18 LAB — HEPARIN LEVEL (UNFRACTIONATED): Heparin Unfractionated: 0.25 IU/mL — ABNORMAL LOW (ref 0.30–0.70)

## 2010-12-18 LAB — POCT ACTIVATED CLOTTING TIME: Activated Clotting Time: 128 seconds

## 2010-12-19 DIAGNOSIS — I2510 Atherosclerotic heart disease of native coronary artery without angina pectoris: Secondary | ICD-10-CM

## 2010-12-19 DIAGNOSIS — I200 Unstable angina: Secondary | ICD-10-CM

## 2010-12-19 LAB — BASIC METABOLIC PANEL
CO2: 26 mEq/L (ref 19–32)
Chloride: 111 mEq/L (ref 96–112)
GFR calc Af Amer: >60 mL/min (ref >60)
Potassium: 4 mEq/L (ref 3.5–5.1)
Sodium: 143 mEq/L (ref 135–145)

## 2010-12-19 LAB — GLUCOSE, CAPILLARY
Glucose-Capillary: 112 mg/dL — ABNORMAL HIGH (ref 70–99)
Glucose-Capillary: 173 mg/dL — ABNORMAL HIGH (ref 70–99)
Glucose-Capillary: 145 mg/dL — ABNORMAL HIGH (ref 70–99)
Glucose-Capillary: 132 mg/dL — ABNORMAL HIGH (ref 70–99)

## 2010-12-19 LAB — CBC
Hemoglobin: 11.8 g/dL — ABNORMAL LOW (ref 13.0–17.0)
Platelets: 136 10*3/uL — ABNORMAL LOW (ref 150–400)
RBC: 3.86 MIL/uL — ABNORMAL LOW (ref 4.22–5.81)
WBC: 5.6 10*3/uL (ref 4.0–10.5)

## 2010-12-19 LAB — PLATELET INHIBITION P2Y12: P2Y12 % Inhibition: 55 %

## 2010-12-19 LAB — HEPARIN LEVEL (UNFRACTIONATED): Heparin Unfractionated: 0.61 IU/mL (ref 0.30–0.70)

## 2010-12-19 LAB — PSA: PSA: 1.33 ng/mL (ref <=4.00)

## 2010-12-20 DIAGNOSIS — F0630 Mood disorder due to known physiological condition, unspecified: Secondary | ICD-10-CM

## 2010-12-20 DIAGNOSIS — F4310 Post-traumatic stress disorder, unspecified: Secondary | ICD-10-CM

## 2010-12-20 LAB — BASIC METABOLIC PANEL
BUN: 10 mg/dL (ref 6–23)
CO2: 27 mEq/L (ref 19–32)
Chloride: 110 mEq/L (ref 96–112)
Creatinine, Ser: 0.99 mg/dL (ref 0.4–1.5)
GFR calc Af Amer: >60 mL/min (ref >60)
Potassium: 3.7 mEq/L (ref 3.5–5.1)

## 2010-12-20 LAB — CBC
Hemoglobin: 12.2 g/dL — ABNORMAL LOW (ref 13.0–17.0)
MCH: 30.6 pg (ref 26.0–34.0)
MCV: 92.2 fL (ref 78.0–100.0)
Platelets: 127 10*3/uL — ABNORMAL LOW (ref 150–400)
RBC: 3.99 MIL/uL — ABNORMAL LOW (ref 4.22–5.81)
WBC: 5 10*3/uL (ref 4.0–10.5)

## 2010-12-20 LAB — PLATELET INHIBITION P2Y12: P2Y12 % Inhibition: 34 %

## 2010-12-20 LAB — GLUCOSE, CAPILLARY: Glucose-Capillary: 133 mg/dL — ABNORMAL HIGH (ref 70–99)

## 2010-12-20 LAB — HEPARIN LEVEL (UNFRACTIONATED): Heparin Unfractionated: 0.75 IU/mL — ABNORMAL HIGH (ref 0.30–0.70)

## 2010-12-21 DIAGNOSIS — Z01810 Encounter for preprocedural cardiovascular examination: Secondary | ICD-10-CM

## 2010-12-21 DIAGNOSIS — I2510 Atherosclerotic heart disease of native coronary artery without angina pectoris: Secondary | ICD-10-CM

## 2010-12-21 LAB — COMPREHENSIVE METABOLIC PANEL
Albumin: 3.5 g/dL (ref 3.5–5.2)
BUN: 11 mg/dL (ref 6–23)
Calcium: 8.9 mg/dL (ref 8.4–10.5)
Glucose, Bld: 118 mg/dL — ABNORMAL HIGH (ref 70–99)
Sodium: 141 mEq/L (ref 135–145)
Total Protein: 5.8 g/dL — ABNORMAL LOW (ref 6.0–8.3)

## 2010-12-21 LAB — GLUCOSE, CAPILLARY
Glucose-Capillary: 177 mg/dL — ABNORMAL HIGH (ref 70–99)
Glucose-Capillary: 127 mg/dL — ABNORMAL HIGH (ref 70–99)

## 2010-12-21 LAB — CBC
Hemoglobin: 12.3 g/dL — ABNORMAL LOW (ref 13.0–17.0)
MCHC: 33.4 g/dL (ref 30.0–36.0)
RDW: 12.7 % (ref 11.5–15.5)
WBC: 5.9 10*3/uL (ref 4.0–10.5)

## 2010-12-21 LAB — PLATELET INHIBITION P2Y12
P2Y12 % Inhibition: 40 %
Platelet Function  P2Y12: 195 [PRU] (ref 194–418)
Platelet Function Baseline: 325 [PRU] (ref 194–418)

## 2010-12-21 LAB — FOLATE: Folate: 6.2 ng/mL

## 2010-12-21 LAB — VITAMIN B12: Vitamin B-12: 546 pg/mL (ref 211–911)
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
## 2010-12-23 ENCOUNTER — Telehealth: Payer: Self-pay | Admitting: Cardiology

## 2010-12-24 ENCOUNTER — Other Ambulatory Visit: Payer: Self-pay | Admitting: Cardiothoracic Surgery

## 2010-12-24 ENCOUNTER — Encounter (HOSPITAL_COMMUNITY)
Admit: 2010-12-24 | Discharge: 2010-12-24 | Disposition: A | Discharge status: Home / Self Care | Payer: Federal, State, Local not specified - PPO | Attending: Cardiothoracic Surgery | Admitting: Cardiothoracic Surgery

## 2010-12-24 ENCOUNTER — Inpatient Hospital Stay (HOSPITAL_COMMUNITY)
Admit: 2010-12-24 | Discharge: 2010-12-24 | Disposition: A | Discharge status: Home / Self Care | Payer: Medicare Other | Attending: Cardiothoracic Surgery | Admitting: Cardiothoracic Surgery

## 2010-12-24 ENCOUNTER — Ambulatory Visit (HOSPITAL_COMMUNITY)
Admission: RE | Admit: 2010-12-24 | Discharge: 2010-12-24 | Disposition: A | Discharge status: Home / Self Care | Payer: Federal, State, Local not specified - PPO | Source: Ambulatory Visit | Attending: Cardiothoracic Surgery | Admitting: Cardiothoracic Surgery

## 2010-12-24 DIAGNOSIS — I2510 Atherosclerotic heart disease of native coronary artery without angina pectoris: Secondary | ICD-10-CM

## 2010-12-24 DIAGNOSIS — Z01810 Encounter for preprocedural cardiovascular examination: Secondary | ICD-10-CM | POA: Insufficient documentation

## 2010-12-24 DIAGNOSIS — Z01818 Encounter for other preprocedural examination: Secondary | ICD-10-CM | POA: Insufficient documentation

## 2010-12-24 DIAGNOSIS — Z01812 Encounter for preprocedural laboratory examination: Secondary | ICD-10-CM | POA: Insufficient documentation

## 2010-12-24 LAB — BLOOD GAS, ARTERIAL
Acid-base deficit: 1.7 mmol/L (ref 0.0–2.0)
Bicarbonate: 22.1 mEq/L (ref 20.0–24.0)
Drawn by: 206361
FIO2: 0.21 %
O2 Saturation: 98.9 %
Patient temperature: 98.6
TCO2: 23.2 mmol/L (ref 0–100)
pCO2 arterial: 35 mmHg (ref 35.0–45.0)
pH, Arterial: 7.417 (ref 7.350–7.450)
pO2, Arterial: 122 mmHg — ABNORMAL HIGH (ref 80.0–100.0)

## 2010-12-24 LAB — COMPREHENSIVE METABOLIC PANEL
ALT: 15 U/L (ref 0–53)
AST: 11 U/L (ref 0–37)
Albumin: 3.7 g/dL (ref 3.5–5.2)
Alkaline Phosphatase: 33 U/L — ABNORMAL LOW (ref 39–117)
BUN: 23 mg/dL (ref 6–23)
CO2: 24 mEq/L (ref 19–32)
Calcium: 8.4 mg/dL (ref 8.4–10.5)
Chloride: 108 mEq/L (ref 96–112)
Creatinine, Ser: 1.01 mg/dL (ref 0.4–1.5)
GFR calc Af Amer: >60 mL/min (ref >60)
GFR calc non Af Amer: >60 mL/min (ref >60)
Glucose, Bld: 120 mg/dL — ABNORMAL HIGH (ref 70–99)
Potassium: 4.4 mEq/L (ref 3.5–5.1)
Sodium: 137 mEq/L (ref 135–145)
Total Bilirubin: 0.3 mg/dL (ref 0.3–1.2)
Total Protein: 5.8 g/dL — ABNORMAL LOW (ref 6.0–8.3)

## 2010-12-24 LAB — URINALYSIS, ROUTINE W REFLEX MICROSCOPIC
Bilirubin Urine: NEGATIVE
Glucose, UA: NEGATIVE mg/dL
Hgb urine dipstick: NEGATIVE
Ketones, ur: NEGATIVE mg/dL
Nitrite: NEGATIVE
Protein, ur: NEGATIVE mg/dL
Specific Gravity, Urine: 1.023 (ref 1.005–1.030)
Urobilinogen, UA: 1 mg/dL (ref 0.0–1.0)
pH: 6 (ref 5.0–8.0)

## 2010-12-24 LAB — CBC
HCT: 34.4 % — ABNORMAL LOW (ref 39.0–52.0)
Hemoglobin: 11.8 g/dL — ABNORMAL LOW (ref 13.0–17.0)
MCH: 31.2 pg (ref 26.0–34.0)
MCHC: 34.3 g/dL (ref 30.0–36.0)
MCV: 91 fL (ref 78.0–100.0)
Platelets: 141 10*3/uL — ABNORMAL LOW (ref 150–400)
RBC: 3.78 MIL/uL — ABNORMAL LOW (ref 4.22–5.81)
RDW: 12.7 % (ref 11.5–15.5)
WBC: 6.1 10*3/uL (ref 4.0–10.5)

## 2010-12-24 LAB — PROTIME-INR
INR: 1.04 (ref 0.00–1.49)
Prothrombin Time: 13.8 seconds (ref 11.6–15.2)

## 2010-12-24 LAB — HEMOGLOBIN A1C
Hgb A1c MFr Bld: 6.1 % — ABNORMAL HIGH (ref <5.7)
Mean Plasma Glucose: 128 mg/dL — ABNORMAL HIGH (ref <117)

## 2010-12-24 LAB — SURGICAL PCR SCREEN
MRSA, PCR: NEGATIVE
Staphylococcus aureus: NEGATIVE

## 2010-12-24 LAB — APTT: aPTT: 30 seconds (ref 24–37)

## 2010-12-25 NOTE — Telephone Encounter (Signed)
Patient states that  His new phychiatric medication script was written wrong. He wanted Dr. Kirtland Bouchard to correct his script. Pt stated that he was unable to get in touch with his psychiatrist. He stated that he has tried to reach him for days. I explained to the patient that this is not a medication that our office could help him with. I told him that he would have to keep trying to contact his psychiatrist. Patient verbalized understanding. Phone call ended.

## 2010-12-27 ENCOUNTER — Emergency Department (HOSPITAL_COMMUNITY): Payer: Medicare Other

## 2010-12-27 ENCOUNTER — Inpatient Hospital Stay (HOSPITAL_COMMUNITY)
Admission: EM | Admit: 2010-12-27 | Discharge: 2011-01-01 | DRG: 236 | Disposition: A | Discharge status: Home / Self Care | Payer: Medicare Other | Attending: Cardiothoracic Surgery | Admitting: Cardiothoracic Surgery

## 2010-12-27 DIAGNOSIS — Z7982 Long term (current) use of aspirin: Secondary | ICD-10-CM

## 2010-12-27 DIAGNOSIS — I6529 Occlusion and stenosis of unspecified carotid artery: Secondary | ICD-10-CM | POA: Diagnosis present

## 2010-12-27 DIAGNOSIS — E785 Hyperlipidemia, unspecified: Secondary | ICD-10-CM | POA: Diagnosis present

## 2010-12-27 DIAGNOSIS — R55 Syncope and collapse: Secondary | ICD-10-CM

## 2010-12-27 DIAGNOSIS — E876 Hypokalemia: Secondary | ICD-10-CM | POA: Diagnosis not present

## 2010-12-27 DIAGNOSIS — I10 Essential (primary) hypertension: Secondary | ICD-10-CM | POA: Diagnosis present

## 2010-12-27 DIAGNOSIS — I200 Unstable angina: Secondary | ICD-10-CM | POA: Diagnosis present

## 2010-12-27 DIAGNOSIS — Z8673 Personal history of transient ischemic attack (TIA), and cerebral infarction without residual deficits: Secondary | ICD-10-CM

## 2010-12-27 DIAGNOSIS — I2510 Atherosclerotic heart disease of native coronary artery without angina pectoris: Secondary | ICD-10-CM | POA: Diagnosis present

## 2010-12-27 DIAGNOSIS — F341 Dysthymic disorder: Secondary | ICD-10-CM | POA: Diagnosis present

## 2010-12-27 DIAGNOSIS — E162 Hypoglycemia, unspecified: Secondary | ICD-10-CM | POA: Diagnosis not present

## 2010-12-27 DIAGNOSIS — Z79899 Other long term (current) drug therapy: Secondary | ICD-10-CM

## 2010-12-27 DIAGNOSIS — F4310 Post-traumatic stress disorder, unspecified: Secondary | ICD-10-CM | POA: Diagnosis present

## 2010-12-27 DIAGNOSIS — E8779 Other fluid overload: Secondary | ICD-10-CM | POA: Diagnosis not present

## 2010-12-27 DIAGNOSIS — I951 Orthostatic hypotension: Principal | ICD-10-CM | POA: Diagnosis present

## 2010-12-27 DIAGNOSIS — I658 Occlusion and stenosis of other precerebral arteries: Secondary | ICD-10-CM | POA: Diagnosis present

## 2010-12-27 LAB — TYPE AND SCREEN
ABO/RH(D): A POS
Antibody Screen: NEGATIVE

## 2010-12-27 LAB — CBC
HCT: 38.8 % — ABNORMAL LOW (ref 39.0–52.0)
MCH: 31.2 pg (ref 26.0–34.0)
MCV: 92.4 fL (ref 78.0–100.0)
Platelets: 175 10*3/uL (ref 150–400)
RBC: 4.2 MIL/uL — ABNORMAL LOW (ref 4.22–5.81)

## 2010-12-27 LAB — DIFFERENTIAL
Eosinophils Absolute: 0.3 10*3/uL (ref 0.0–0.7)
Eosinophils Relative: 5 % (ref 0–5)
Lymphs Abs: 2 10*3/uL (ref 0.7–4.0)
Monocytes Relative: 9 % (ref 3–12)
Neutrophils Relative %: 54 % (ref 43–77)

## 2010-12-27 LAB — BASIC METABOLIC PANEL
BUN: 16 mg/dL (ref 6–23)
Chloride: 103 mEq/L (ref 96–112)
GFR calc Af Amer: >60 mL/min (ref >60)
Potassium: 3.6 mEq/L (ref 3.5–5.1)
Sodium: 135 mEq/L (ref 135–145)

## 2010-12-27 LAB — POCT CARDIAC MARKERS
CKMB, poc: <1 ng/mL — ABNORMAL LOW (ref 1.0–8.0)
Myoglobin, poc: 43.5 ng/mL (ref 12–200)
Troponin i, poc: <0.05 ng/mL (ref 0.00–0.09)
Troponin i, poc: <0.05 ng/mL (ref 0.00–0.09)

## 2010-12-27 LAB — CK TOTAL AND CKMB (NOT AT ARMC): Relative Index: INVALID (ref 0.0–2.5)

## 2010-12-27 LAB — GLUCOSE, CAPILLARY

## 2010-12-28 ENCOUNTER — Inpatient Hospital Stay (HOSPITAL_COMMUNITY): Payer: Medicare Other

## 2010-12-28 ENCOUNTER — Inpatient Hospital Stay (HOSPITAL_COMMUNITY)
Admission: RE | Admit: 2010-12-28 | Payer: Federal, State, Local not specified - PPO | Source: Ambulatory Visit | Admitting: Cardiothoracic Surgery

## 2010-12-28 DIAGNOSIS — I2510 Atherosclerotic heart disease of native coronary artery without angina pectoris: Secondary | ICD-10-CM

## 2010-12-28 LAB — CBC
HCT: 25.7 % — ABNORMAL LOW (ref 39.0–52.0)
HCT: 31.4 % — ABNORMAL LOW (ref 39.0–52.0)
Hemoglobin: 8.9 g/dL — ABNORMAL LOW (ref 13.0–17.0)
Hemoglobin: 10.7 g/dL — ABNORMAL LOW (ref 13.0–17.0)
MCH: 30.6 pg (ref 26.0–34.0)
MCH: 31.4 pg (ref 26.0–34.0)
MCH: 30.8 pg (ref 26.0–34.0)
MCHC: 33.3 g/dL (ref 30.0–36.0)
MCHC: 34.6 g/dL (ref 30.0–36.0)
MCHC: 34.1 g/dL (ref 30.0–36.0)
MCV: 91.8 fL (ref 78.0–100.0)
MCV: 90.8 fL (ref 78.0–100.0)
MCV: 90.5 fL (ref 78.0–100.0)
Platelets: 156 10*3/uL (ref 150–400)
Platelets: 88 10*3/uL — ABNORMAL LOW (ref 150–400)
Platelets: 115 10*3/uL — ABNORMAL LOW (ref 150–400)
RBC: 2.83 MIL/uL — ABNORMAL LOW (ref 4.22–5.81)
RBC: 3.47 MIL/uL — ABNORMAL LOW (ref 4.22–5.81)
RDW: 13 % (ref 11.5–15.5)
RDW: 13.2 % (ref 11.5–15.5)
WBC: 6.8 10*3/uL (ref 4.0–10.5)
WBC: 8.2 10*3/uL (ref 4.0–10.5)

## 2010-12-28 LAB — POCT I-STAT 4, (NA,K, GLUC, HGB,HCT)
Glucose, Bld: 139 mg/dL — ABNORMAL HIGH (ref 70–99)
Glucose, Bld: 112 mg/dL — ABNORMAL HIGH (ref 70–99)
Glucose, Bld: 127 mg/dL — ABNORMAL HIGH (ref 70–99)
HCT: 30 % — ABNORMAL LOW (ref 39.0–52.0)
HCT: 22 % — ABNORMAL LOW (ref 39.0–52.0)
Hemoglobin: 8.8 g/dL — ABNORMAL LOW (ref 13.0–17.0)
Hemoglobin: 7.5 g/dL — ABNORMAL LOW (ref 13.0–17.0)
Hemoglobin: 8.8 g/dL — ABNORMAL LOW (ref 13.0–17.0)
Hemoglobin: 8.8 g/dL — ABNORMAL LOW (ref 13.0–17.0)
Potassium: 3.9 mEq/L (ref 3.5–5.1)
Potassium: 3.7 mEq/L (ref 3.5–5.1)
Potassium: 3.8 mEq/L (ref 3.5–5.1)
Potassium: 4.8 mEq/L (ref 3.5–5.1)
Potassium: 3.4 mEq/L — ABNORMAL LOW (ref 3.5–5.1)
Sodium: 137 mEq/L (ref 135–145)

## 2010-12-28 LAB — POCT I-STAT 3, ART BLOOD GAS (G3+)
Acid-base deficit: 3 mmol/L — ABNORMAL HIGH (ref 0.0–2.0)
Bicarbonate: 20.8 mEq/L (ref 20.0–24.0)
Bicarbonate: 22.4 mEq/L (ref 20.0–24.0)
O2 Saturation: 100 %
Patient temperature: 31.5
Patient temperature: 37
Patient temperature: 35.2
Patient temperature: 37.6
TCO2: 22 mmol/L (ref 0–100)
TCO2: 24 mmol/L (ref 0–100)
TCO2: 23 mmol/L (ref 0–100)
TCO2: 24 mmol/L (ref 0–100)
pCO2 arterial: 27.3 mmHg — ABNORMAL LOW (ref 35.0–45.0)
pCO2 arterial: 39.6 mmHg (ref 35.0–45.0)
pH, Arterial: 7.466 — ABNORMAL HIGH (ref 7.350–7.450)
pH, Arterial: 7.364 (ref 7.350–7.450)
pH, Arterial: 7.364 (ref 7.350–7.450)
pO2, Arterial: 249 mmHg — ABNORMAL HIGH (ref 80.0–100.0)

## 2010-12-28 LAB — CARDIAC PANEL(CRET KIN+CKTOT+MB+TROPI)
Relative Index: INVALID (ref 0.0–2.5)
Troponin I: <0.01 ng/mL (ref 0.00–0.06)

## 2010-12-28 LAB — DIFFERENTIAL
Basophils Relative: 0 % (ref 0–1)
Eosinophils Absolute: 0.4 10*3/uL (ref 0.0–0.7)
Lymphs Abs: 2 10*3/uL (ref 0.7–4.0)
Monocytes Absolute: 0.5 10*3/uL (ref 0.1–1.0)
Monocytes Relative: 8 % (ref 3–12)

## 2010-12-28 LAB — HEMOGLOBIN AND HEMATOCRIT, BLOOD: HCT: 23 % — ABNORMAL LOW (ref 39.0–52.0)

## 2010-12-28 LAB — POCT I-STAT 3, VENOUS BLOOD GAS (G3P V)
Bicarbonate: 19.9 mEq/L — ABNORMAL LOW (ref 20.0–24.0)
Pressure control: 1 cmH2O
pCO2, Ven: 35.3 mmHg — ABNORMAL LOW (ref 45.0–50.0)
pO2, Ven: 44 mmHg (ref 30.0–45.0)

## 2010-12-28 LAB — CREATININE, SERUM
Creatinine, Ser: 0.95 mg/dL (ref 0.4–1.5)
GFR calc Af Amer: >60 mL/min (ref >60)
GFR calc non Af Amer: >60 mL/min (ref >60)

## 2010-12-28 LAB — POCT I-STAT, CHEM 8
BUN: 10 mg/dL (ref 6–23)
Calcium, Ion: 1.11 mmol/L — ABNORMAL LOW (ref 1.12–1.32)
Chloride: 108 mEq/L (ref 96–112)
Creatinine, Ser: 0.9 mg/dL (ref 0.4–1.5)
Glucose, Bld: 160 mg/dL — ABNORMAL HIGH (ref 70–99)

## 2010-12-28 LAB — BASIC METABOLIC PANEL
BUN: 15 mg/dL (ref 6–23)
CO2: 27 mEq/L (ref 19–32)
Calcium: 8.6 mg/dL (ref 8.4–10.5)
Chloride: 107 mEq/L (ref 96–112)
Creatinine, Ser: 1.18 mg/dL (ref 0.4–1.5)
GFR calc Af Amer: >60 mL/min (ref >60)

## 2010-12-28 LAB — MAGNESIUM: Magnesium: 2.9 mg/dL — ABNORMAL HIGH (ref 1.5–2.5)

## 2010-12-28 LAB — PROTIME-INR
INR: 1.6 — ABNORMAL HIGH (ref 0.00–1.49)
Prothrombin Time: 19.2 seconds — ABNORMAL HIGH (ref 11.6–15.2)

## 2010-12-28 LAB — GLUCOSE, CAPILLARY: Glucose-Capillary: 103 mg/dL — ABNORMAL HIGH (ref 70–99)

## 2010-12-28 LAB — APTT: aPTT: 39 seconds — ABNORMAL HIGH (ref 24–37)

## 2010-12-28 LAB — PLATELET COUNT: Platelets: 96 10*3/uL — ABNORMAL LOW (ref 150–400)

## 2010-12-29 ENCOUNTER — Other Ambulatory Visit: Payer: Self-pay

## 2010-12-29 ENCOUNTER — Inpatient Hospital Stay (HOSPITAL_COMMUNITY): Payer: Medicare Other

## 2010-12-29 LAB — CBC
HCT: 28.4 % — ABNORMAL LOW (ref 39.0–52.0)
HCT: 31.4 % — ABNORMAL LOW (ref 39.0–52.0)
Hemoglobin: 9.8 g/dL — ABNORMAL LOW (ref 13.0–17.0)
Hemoglobin: 10.5 g/dL — ABNORMAL LOW (ref 13.0–17.0)
MCH: 31.4 pg (ref 26.0–34.0)
MCH: 30.8 pg (ref 26.0–34.0)
MCHC: 34.5 g/dL (ref 30.0–36.0)
MCHC: 33.4 g/dL (ref 30.0–36.0)
MCV: 91 fL (ref 78.0–100.0)
MCV: 92.1 fL (ref 78.0–100.0)
Platelets: 128 10*3/uL — ABNORMAL LOW (ref 150–400)
Platelets: 128 10*3/uL — ABNORMAL LOW (ref 150–400)
RBC: 3.12 MIL/uL — ABNORMAL LOW (ref 4.22–5.81)
RBC: 3.41 MIL/uL — ABNORMAL LOW (ref 4.22–5.81)
RDW: 13.4 % (ref 11.5–15.5)
RDW: 13.3 % (ref 11.5–15.5)
WBC: 8.8 10*3/uL (ref 4.0–10.5)
WBC: 11.4 10*3/uL — ABNORMAL HIGH (ref 4.0–10.5)

## 2010-12-29 LAB — BASIC METABOLIC PANEL
BUN: 10 mg/dL (ref 6–23)
CO2: 22 mEq/L (ref 19–32)
Calcium: 7.4 mg/dL — ABNORMAL LOW (ref 8.4–10.5)
Chloride: 107 mEq/L (ref 96–112)
Creatinine, Ser: 1.28 mg/dL (ref 0.4–1.5)
GFR calc Af Amer: >60 mL/min (ref >60)
GFR calc non Af Amer: 56 mL/min — ABNORMAL LOW (ref >60)
Glucose, Bld: 104 mg/dL — ABNORMAL HIGH (ref 70–99)
Potassium: 4.8 mEq/L (ref 3.5–5.1)
Sodium: 134 mEq/L — ABNORMAL LOW (ref 135–145)

## 2010-12-29 LAB — GLUCOSE, CAPILLARY
Glucose-Capillary: 108 mg/dL — ABNORMAL HIGH (ref 70–99)
Glucose-Capillary: 120 mg/dL — ABNORMAL HIGH (ref 70–99)
Glucose-Capillary: 145 mg/dL — ABNORMAL HIGH (ref 70–99)
Glucose-Capillary: 179 mg/dL — ABNORMAL HIGH (ref 70–99)

## 2010-12-29 LAB — MAGNESIUM
Magnesium: 2.6 mg/dL — ABNORMAL HIGH (ref 1.5–2.5)
Magnesium: 2.5 mg/dL (ref 1.5–2.5)

## 2010-12-29 LAB — CREATININE, SERUM
Creatinine, Ser: 1.29 mg/dL (ref 0.4–1.5)
GFR calc Af Amer: >60 mL/min (ref >60)
GFR calc non Af Amer: 56 mL/min — ABNORMAL LOW (ref >60)

## 2010-12-30 ENCOUNTER — Inpatient Hospital Stay (HOSPITAL_COMMUNITY): Payer: Medicare Other

## 2010-12-30 LAB — CBC
HCT: 29 % — ABNORMAL LOW (ref 39.0–52.0)
Hemoglobin: 9.8 g/dL — ABNORMAL LOW (ref 13.0–17.0)
MCH: 31.1 pg (ref 26.0–34.0)
MCHC: 33.8 g/dL (ref 30.0–36.0)
MCV: 92.1 fL (ref 78.0–100.0)
Platelets: 119 10*3/uL — ABNORMAL LOW (ref 150–400)
RBC: 3.15 MIL/uL — ABNORMAL LOW (ref 4.22–5.81)
RDW: 13.3 % (ref 11.5–15.5)
WBC: 10 10*3/uL (ref 4.0–10.5)

## 2010-12-30 LAB — POCT I-STAT, CHEM 8
BUN: 15 mg/dL (ref 6–23)
Calcium, Ion: 1.08 mmol/L — ABNORMAL LOW (ref 1.12–1.32)
Chloride: 102 mEq/L (ref 96–112)
Creatinine, Ser: 1.3 mg/dL (ref 0.4–1.5)

## 2010-12-30 LAB — BASIC METABOLIC PANEL
BUN: 14 mg/dL (ref 6–23)
CO2: 25 mEq/L (ref 19–32)
Calcium: 7.8 mg/dL — ABNORMAL LOW (ref 8.4–10.5)
Chloride: 104 mEq/L (ref 96–112)
Creatinine, Ser: 1.36 mg/dL (ref 0.4–1.5)
GFR calc Af Amer: >60 mL/min (ref >60)
GFR calc non Af Amer: 52 mL/min — ABNORMAL LOW (ref >60)
Glucose, Bld: 112 mg/dL — ABNORMAL HIGH (ref 70–99)
Potassium: 4.3 mEq/L (ref 3.5–5.1)
Sodium: 133 mEq/L — ABNORMAL LOW (ref 135–145)

## 2010-12-30 LAB — GLUCOSE, CAPILLARY
Glucose-Capillary: 86 mg/dL (ref 70–99)
Glucose-Capillary: 115 mg/dL — ABNORMAL HIGH (ref 70–99)
Glucose-Capillary: 139 mg/dL — ABNORMAL HIGH (ref 70–99)
Glucose-Capillary: 108 mg/dL — ABNORMAL HIGH (ref 70–99)

## 2010-12-31 ENCOUNTER — Inpatient Hospital Stay (HOSPITAL_COMMUNITY): Payer: Medicare Other

## 2010-12-31 LAB — BASIC METABOLIC PANEL
BUN: 13 mg/dL (ref 6–23)
CO2: 26 mEq/L (ref 19–32)
Calcium: 8.1 mg/dL — ABNORMAL LOW (ref 8.4–10.5)
Chloride: 104 mEq/L (ref 96–112)
Creatinine, Ser: 1.19 mg/dL (ref 0.4–1.5)
GFR calc Af Amer: >60 mL/min (ref >60)
GFR calc non Af Amer: >60 mL/min (ref >60)
Glucose, Bld: 90 mg/dL (ref 70–99)
Potassium: 3.6 mEq/L (ref 3.5–5.1)
Sodium: 137 mEq/L (ref 135–145)

## 2010-12-31 LAB — GLUCOSE, CAPILLARY
Glucose-Capillary: 84 mg/dL (ref 70–99)
Glucose-Capillary: 127 mg/dL — ABNORMAL HIGH (ref 70–99)
Glucose-Capillary: 112 mg/dL — ABNORMAL HIGH (ref 70–99)

## 2010-12-31 LAB — CBC
HCT: 28.5 % — ABNORMAL LOW (ref 39.0–52.0)
Hemoglobin: 9.5 g/dL — ABNORMAL LOW (ref 13.0–17.0)
MCH: 30 pg (ref 26.0–34.0)
MCHC: 33.3 g/dL (ref 30.0–36.0)
MCV: 89.9 fL (ref 78.0–100.0)
Platelets: 114 10*3/uL — ABNORMAL LOW (ref 150–400)
RBC: 3.17 MIL/uL — ABNORMAL LOW (ref 4.22–5.81)
RDW: 12.9 % (ref 11.5–15.5)
WBC: 6.1 10*3/uL (ref 4.0–10.5)

## 2010-12-31 NOTE — Op Note (Signed)
[name], [name]               ACCOUNT NO.:  [id number]  MEDICAL RECORD NO.:  [id number]           PATIENT TYPE:  I  LOCATION:  2314                         FACILITY:  [facility]  PHYSICIAN:  Sheliah Plane, MD    DATE OF BIRTH:  March 04, 1946  DATE OF PROCEDURE:  12/28/2010 DATE OF DISCHARGE:                              OPERATIVE REPORT   PREOPERATIVE DIAGNOSIS:  Coronary occlusive disease with unstable angina.  POSTOPERATIVE DIAGNOSIS:  Coronary occlusive disease with unstable angina.  SURGICAL PROCEDURE:  Coronary artery bypass grafting x3 with left internal mammary to the left anterior descending coronary artery, reverse saphenous vein graft to the obtuse marginal, and reverse saphenous vein graft to the posterior descending arising from the distal circumflex with left leg endovein harvesting.  SURGEON:  Sheliah Plane, MD  FIRST ASSISTANT:  Rowe Clack, [facility]  BRIEF HISTORY:  The patient is a 65 year old male, who presented with new onset of angina after attempting to cut grass the week before.  The patient has had numerous neurologic events and also a longstanding psychiatric history.  Cardiac catheterization was performed and showed severe diffuse three-vessel coronary artery disease, which had progressed from catheterization in 2005.  The right coronary artery was a very small vessel and not large enough to bypass.  There was a 70-80% stenosis in the first obtuse marginal, 70-80% stenosis in the LAD, and disease in the distal circumflex as it gives rise to the posterior descending branch of the inferior heart.  Overall ventricular function was preserved.  After consideration of the various options and with consultation with Psychiatry in regard to the patient's competency, it was decided to proceed with coronary artery bypass grafting.  The patient agreed and signed informed consent.  The risks and options were explained in detail.  DESCRIPTION OF PROCEDURE:  With  Swan-Ganz and arterial line monitors in place, the patient underwent general endotracheal anesthesia without incidence.  Skin of chest and leg was prepped with Betadine and draped in the usual sterile manner.  Using the Guidant endovein harvest system, vein was harvested from the left thigh and calf and was of good quality and caliber.  Median sternotomy was performed.  Left internal mammary artery was dissected down as a pedicle graft, the distal artery was divided, and had a good free flow.  Pericardium was opened.  Overall ventricular function appeared preserved.  The patient was systemically heparinized.  The ascending aorta was cannulated, the right atrium was cannulated, and aortic root vent cardioplegia needle was introduced into the ascending aorta.  The patient was placed on cardiopulmonary pulmonary bypass 2.4 liters per minute per meter square.  Sites of anastomosis were selected and dissected out at the epicardium.  The patient's body temperature was cooled to 30 degrees.  Aortic crossclamp was applied.  500 mL of cold blood potassium cardioplegia was administered with diastolic arrest of the heart.  Myocardial septal temperature was monitored throughout the crossclamp.  Attention was turned first to the posterior descending coronary artery as it arose from the distal circumflex on the inferior surface of the heart.  The vessel was opened and admitted a  1.5-mm probe distally. Using a running 7-0 Prolene, distal anastomosis was performed. Attention was then turned to the obtuse marginal coronary artery, which was opened and admitted a 1.5-mm probe.  Using a running 7-0 Prolene, distal anastomosis was performed.  Additional cold blood cardioplegia was administered down the vein graft.  Attention was then turned to the left internal mammary artery, which was opened in the midportion.  The vessel had significant distal disease.  It did admit a 1-mm probe distally.  Using a  running 8-0 Prolene, the left internal mammary artery was anastomosed to the left anterior descending coronary artery.  With release of the bulldog on mammary artery, there was rise in myocardial septal temperature.  Bulldog was placed back on the mammary artery with the crossclamp still in place.  Two punch aortotomies were performed and each of the two vein grafts was anastomosed to the ascending aorta.  Air was evacuated from the grafts and the ascending aorta.  Bulldog was removed from the mammary artery and there was a prompt rise in myocardial septal temperature.  Aortic crossclamp was removed with a total crossclamp time of 72 minutes.  Sites of anastomosis were inspected and free of bleeding.  The patient spontaneously converted to a sinus rhythm.  With the body temperature rewarmed to 37 degrees, he was then ventilated and weaned from cardiopulmonary bypass without difficulty.  He remained hemodynamically stable.  He was decannulated in usual fashion.  Protamine sulfate was administered with operative field hemostatic.  Two atrial tube ventricular pacing wires were applied.  Graft markers were applied.  A left pleural tube and Blake mediastinal drain were left in place. Pericardium was reapproximated.  Sternum was closed with #6 stainless steel wire.  Fascia was closed with interrupted 0 Vicryl, running 3-0 Vicryl, and subcutaneous tissue.  4-0 subcuticular stitch in skin edges. Dry dressings were applied.  Sponge and needle count was reported as correct at the completion of the procedure.  The patient tolerated the procedure without obvious complication and was transferred to Surgical Intensive Care Unit for further postoperative care.  Total pump time was 98 minutes.     Sheliah Plane, MD     EG/MEDQ  D:  12/30/2010  T:  12/30/2010  Job:  [id number]  cc:   Rollene Rotunda, MD, [facility]  Electronically Signed by Sheliah Plane MD on 12/31/2010 03:43:45 PM

## 2011-01-01 LAB — GLUCOSE, CAPILLARY: Glucose-Capillary: 84 mg/dL (ref 70–99)

## 2011-01-01 LAB — TYPE AND SCREEN
ABO/RH(D): A POS
Antibody Screen: NEGATIVE
Donor AG Type: NEGATIVE
Donor AG Type: NEGATIVE
Unit division: 0
Unit division: 0

## 2011-01-04 NOTE — Discharge Summary (Signed)
[name], [name]               ACCOUNT NO.:  [id number]  MEDICAL RECORD NO.:  [id number]           PATIENT TYPE:  I  LOCATION:  2008                         FACILITY:  [facility]  PHYSICIAN:  Sheliah Plane, MD    DATE OF BIRTH:  June 08, 1946  DATE OF ADMISSION:  12/27/2010 DATE OF DISCHARGE:  01/01/2011                              DISCHARGE SUMMARY   PRIMARY ADMITTING DIAGNOSIS:  Syncope.  ADDITIONAL/DISCHARGE DIAGNOSES: 1. Syncope. 2. Coronary artery disease. 3. Unstable angina. 4. Hypertension. 5. Hyperlipidemia. 6. Posttraumatic stress disorder. 7. Depression/anxiety. 8. 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 9. History of transient ischemic attack. 10.History of orthostatic hypotension.  PROCEDURES PERFORMED: 1. Coronary artery bypass grafting x3 (left internal mammary artery to     the LAD, saphenous vein graft to the obtuse marginal, saphenous     vein graft to the posterior descending of left circumflex). 2. Endoscopic vein harvest, left leg.  HISTORY:  The patient is a 65 year old male, who was recently admitted to [facility] approximately 2 weeks ago with new-onset angina. During that admission, he underwent cardiac catheterization, which showed significant coronary artery disease including a 75% mid LAD, 75% distal circumflex, 70% proximal and mid obtuse marginal, 80% mid PDA, and 90% proximal right coronary artery stenosis.  During that admission, he was seen by Dr. Tyrone Sage and was scheduled for coronary artery bypass grafting as an outpatient on December 28, 2010.  However, on the day prior to admission, he developed syncope and presented to the emergency department for further evaluation.  He denied any chest pain or shortness of breath.  He was seen in the ER by Cardiology and was subsequently admitted for further workup.  HOSPITAL COURSE:  [name] was admitted and was  noted to be bradycardic.  His metoprolol was placed on hold and serial cardiac enzymes were obtained.  These were negative for acute cardiac event.  He remained stable and had no further syncope, and it was felt that he should proceed as scheduled with coronary artery bypass grafting.  He was taken to the operating room on December 28, 2010 and underwent CABG x3 performed by Dr. Tyrone Sage.  Please see previously dictated operative report for complete details of surgery.  He tolerated the procedure well and was transferred to the SICU in stable condition.  He was extubated shortly after surgery.  He was hemodynamically stable and doing well on postop day #1.  He did remain in the unit for further observation.  His chest tubes and hemodynamic monitoring lines were removed and from a cardiovascular standpoint, he remained stable.  By postop day #2, he was ready for transfer to the step-down unit.  Postoperatively, he has done very well.  He has had some mild tachycardia requiring titration of his beta-blocker dosage, but has otherwise remained in sinus rhythm.  His blood pressures have been controlled.  He has had some mild hypoglycemia and has been followed with CBGs and treated with sliding scale insulin only.  Of note, he did have a slight elevation of  his hemoglobin A1c upon admission at 6.1, and this will need to be followed as an outpatient by the [place].  He has been mildly volume overloaded and was started on Lasix to which he is responding well.  He remains approximately 2 kg above his preoperative weight with some mild lower extremity edema on physical exam.  Otherwise, his incisions are all healing well.  He is ambulating in the halls with cardiac rehab phase I and independently and is progressing well.  He has remained afebrile and his vital signs have been stable.  He has been weaned from supplemental oxygen and is maintaining sats of greater than 90% on room air.  He is tolerating a  regular diet and is having normal bowel and bladder function.  His labs on postop day #3 show hemoglobin of 9.5, hematocrit 28.5, platelets 114, white count 6.1.  Sodium 135, potassium 3.6 which has been repleted, BUN 13, creatinine 1.19.  Chest x-ray shows stable bibasilar atelectasis and small pleural effusions, which are also stable.  It was felt that if he continues to progress over the next 24 hours, he will hopefully be ready for discharge home on January 01, 2011.  DISCHARGE MEDICATIONS: 1. Oxycodone IR 5 mg q.3 h p.r.n. for pain. 2. Zocor 20 mg at bedtime. 3. Metoprolol 25 mg b.i.d. 4. Aspirin 81 mg daily. 5. Clonazepam 0.5 mg b.i.d. 6. Divalproex 250 mg b.i.d. 7. Ferrous gluconate 324 mg b.i.d. 8. Plavix 75 mg daily. 9. Quetiapine 200 mg 1/2 tablet at bedtime. 10.Flomax 0.4 mg 2 capsules b.i.d. 11.Trazodone 50 mg 1/2 tablet at bedtime. 12.Venlafaxine 75 mg 2 tablets in the morning and 1 tablet at bedtime.  DISCHARGE INSTRUCTIONS:  He is asked to refrain from driving, heavy lifting, or strenuous activity.  He may continue ambulating daily and using his incentive spirometer.  He may shower daily and clean his incisions with soap and water.  He will continue a low-fat, low-sodium diet.  DISCHARGE FOLLOWUP:  He will need to make an appointment to see Dr. Antoine Poche in 2 weeks.  He will also need to follow up with his primary care physician at the [facility] in 1-2 weeks for recheck of his blood sugars and other medical issues.  He will follow up as directed with Psychiatry.  He will see Dr. Tyrone Sage in 3 weeks with a chest x-ray.  In the interim, if he experiences any problems or has questions, he is asked to contact our office immediately.     Coral Ceo, P.A.   ______________________________ Sheliah Plane, MD    GC/MEDQ  D:  12/31/2010  T:  01/01/2011  Job:  [id number]  cc:   Rollene Rotunda, MD, [facility]  Electronically Signed by Weldon Inches. on 01/01/2011  04:35:53 PM Electronically Signed by Sheliah Plane MD on 01/04/2011 04:15:02 PM

## 2011-01-11 ENCOUNTER — Telehealth: Payer: Self-pay | Admitting: Cardiology

## 2011-01-11 NOTE — Telephone Encounter (Signed)
Pt wife called stating that he needs a new script for his clonazepam. Please call wife about his medication. Not sure if Dr Antoine Poche can fill this. [phone]

## 2011-01-11 NOTE — Telephone Encounter (Signed)
Not sure if Dr. Coralyn Pear this will forward to his nurse

## 2011-01-18 LAB — URINALYSIS, ROUTINE W REFLEX MICROSCOPIC
Bilirubin Urine: NEGATIVE
Glucose, UA: NEGATIVE mg/dL
Hgb urine dipstick: NEGATIVE
Ketones, ur: NEGATIVE mg/dL
Leukocytes, UA: NEGATIVE
Nitrite: NEGATIVE
Protein, ur: 30 mg/dL — AB
Specific Gravity, Urine: 1.029 (ref 1.005–1.030)
Urobilinogen, UA: 1 mg/dL (ref 0.0–1.0)
pH: 5.5 (ref 5.0–8.0)

## 2011-01-18 LAB — POCT CARDIAC MARKERS
CKMB, poc: <1 ng/mL — ABNORMAL LOW (ref 1.0–8.0)
CKMB, poc: <1 ng/mL — ABNORMAL LOW (ref 1.0–8.0)
Myoglobin, poc: 63.9 ng/mL (ref 12–200)
Myoglobin, poc: 78.4 ng/mL (ref 12–200)
Troponin i, poc: <0.05 ng/mL (ref 0.00–0.09)

## 2011-01-18 LAB — DIFFERENTIAL
Basophils Absolute: 0 10*3/uL (ref 0.0–0.1)
Basophils Relative: 0 % (ref 0–1)
Eosinophils Absolute: 0.1 10*3/uL (ref 0.0–0.7)
Eosinophils Relative: 1 % (ref 0–5)
Lymphocytes Relative: 15 % (ref 12–46)
Lymphs Abs: 0.9 10*3/uL (ref 0.7–4.0)
Monocytes Absolute: 0.4 10*3/uL (ref 0.1–1.0)
Monocytes Relative: 6 % (ref 3–12)
Neutro Abs: 4.6 10*3/uL (ref 1.7–7.7)
Neutrophils Relative %: 78 % — ABNORMAL HIGH (ref 43–77)

## 2011-01-18 LAB — CROSSMATCH
ABO/RH(D): A POS
DAT, IgG: NEGATIVE
Donor AG Type: NEGATIVE

## 2011-01-18 LAB — COMPREHENSIVE METABOLIC PANEL
ALT: 13 U/L (ref 0–53)
AST: 13 U/L (ref 0–37)
Albumin: 3.4 g/dL — ABNORMAL LOW (ref 3.5–5.2)
Alkaline Phosphatase: 59 U/L (ref 39–117)
BUN: 13 mg/dL (ref 6–23)
CO2: 24 mEq/L (ref 19–32)
Calcium: 8.9 mg/dL (ref 8.4–10.5)
Chloride: 111 mEq/L (ref 96–112)
Creatinine, Ser: 0.94 mg/dL (ref 0.4–1.5)
GFR calc Af Amer: >60 mL/min (ref >60)
GFR calc non Af Amer: >60 mL/min (ref >60)
Glucose, Bld: 161 mg/dL — ABNORMAL HIGH (ref 70–99)
Potassium: 4.2 mEq/L (ref 3.5–5.1)
Sodium: 139 mEq/L (ref 135–145)
Total Bilirubin: 0.5 mg/dL (ref 0.3–1.2)
Total Protein: 5.8 g/dL — ABNORMAL LOW (ref 6.0–8.3)

## 2011-01-18 LAB — URINE MICROSCOPIC-ADD ON

## 2011-01-18 LAB — CBC
HCT: 26.2 % — ABNORMAL LOW (ref 39.0–52.0)
HCT: 27.8 % — ABNORMAL LOW (ref 39.0–52.0)
HCT: 29.7 % — ABNORMAL LOW (ref 39.0–52.0)
Hemoglobin: 8.7 g/dL — ABNORMAL LOW (ref 13.0–17.0)
Hemoglobin: 9.6 g/dL — ABNORMAL LOW (ref 13.0–17.0)
MCHC: 31.2 g/dL (ref 30.0–36.0)
MCHC: 32.2 g/dL (ref 30.0–36.0)
MCHC: 32.5 g/dL (ref 30.0–36.0)
MCV: 81.5 fL (ref 78.0–100.0)
MCV: 82.1 fL (ref 78.0–100.0)
Platelets: 155 10*3/uL (ref 150–400)
Platelets: 164 10*3/uL (ref 150–400)
Platelets: 184 10*3/uL (ref 150–400)
RBC: 3.64 MIL/uL — ABNORMAL LOW (ref 4.22–5.81)
RDW: 16 % — ABNORMAL HIGH (ref 11.5–15.5)
RDW: 16 % — ABNORMAL HIGH (ref 11.5–15.5)
WBC: 4.6 10*3/uL (ref 4.0–10.5)

## 2011-01-18 LAB — CK TOTAL AND CKMB (NOT AT ARMC)
CK, MB: 1.2 ng/mL (ref 0.3–4.0)
Relative Index: INVALID (ref 0.0–2.5)
Total CK: 50 U/L (ref 7–232)

## 2011-01-18 LAB — HEMOGLOBIN A1C
Hgb A1c MFr Bld: 6.4 % — ABNORMAL HIGH (ref 4.6–6.1)
Mean Plasma Glucose: 137 mg/dL

## 2011-01-18 LAB — HEMOGLOBIN AND HEMATOCRIT, BLOOD
HCT: 27.8 % — ABNORMAL LOW (ref 39.0–52.0)
Hemoglobin: 9.1 g/dL — ABNORMAL LOW (ref 13.0–17.0)
Hemoglobin: 9.9 g/dL — ABNORMAL LOW (ref 13.0–17.0)

## 2011-01-18 LAB — LIPASE, BLOOD: Lipase: 29 U/L (ref 11–59)

## 2011-01-18 LAB — LIPID PANEL
Cholesterol: 160 mg/dL (ref 0–200)
HDL: 23 mg/dL — ABNORMAL LOW (ref >39)

## 2011-01-18 LAB — CARDIAC PANEL(CRET KIN+CKTOT+MB+TROPI)
Relative Index: INVALID (ref 0.0–2.5)
Troponin I: <0.01 ng/mL (ref 0.00–0.06)

## 2011-01-18 LAB — TSH: TSH: 1.477 u[IU]/mL (ref 0.350–4.500)

## 2011-01-21 NOTE — Telephone Encounter (Signed)
No, Dr Antoine Poche does not fill this medication and pt needs to get it from his psychiatrist.  The pt has been told this before.

## 2011-01-22 NOTE — H&P (Signed)
[name], Franklin NO.:  [id number]  MEDICAL RECORD NO.:  [id number]           PATIENT TYPE:  E  LOCATION:  [facility]                         FACILITY:  [facility]  PHYSICIAN:  Luis Abed, MD, FACCDATE OF BIRTH:  1946-02-15  DATE OF ADMISSION:  12/27/2010 DATE OF DISCHARGE:                             HISTORY & PHYSICAL   CARDIOLOGIST:  Rollene Rotunda, MD, [facility]  PRIMARY CARE PHYSICIAN:  [facility].  CHIEF COMPLAINT:  Syncope.  HISTORY OF PRESENT ILLNESS:  [name] is a 65 year old male, who was just discharged from the hospital on December 22, 2010.  Cardiac catheterization done in the hospital demonstrated three-vessel CAD.  His Plavix was discontinued and he is scheduled for bypass surgery on December 28, 2010.  He was also seen by Psychiatry for his PTSD.  He had several medications changes that were made.  They only think that he could not do was change his venlafaxine due to problems getting it from the pharmacy.  He has a history of orthostasis.  He awoke this morning around 2 a.m. because of bathroom.  He became dizzy upon standing up and eventually forced himself to go to the bathroom.  He then down himself on the floor.  He has no recollection of how long he was unconsciousness.  He did feel disoriented.  He eventually got himself back to bed.  He called his wife, and she urged him to go to the emergency room.  He denies any chest pain or shortness of breath.  He denies orthopnea or PND.  Currently, he feels back to baseline.  PAST MEDICAL HISTORY: 1. CAD.     a.     Cardiac catheterization on December 18, 2010:  Left main 25%;      mid LAD 75%; AV circumflex 25-30%; distal circumflex 75%; proximal      mid obtuse marginal 70%; mid PDA 80%; proximal RCA 90%. 2. Cerebrovascular disease.     a.     Carotid Doppler in March 2012:  Totally occluded RICA; LICA      62-79%. 3. Hypertension. 4. Hyperlipidemia. 5. Glucose intolerance. 6.  PTSD. 7. Depression/anxiety. 8. History of TIA. 9. History of orthostatic hypotension.  MEDICATIONS: 1. Simvastatin 80 mg half tablet daily. 2. Aspirin 81 mg daily. 3. Imdur 30 mg daily. 4. Nitroglycerin p.r.n. chest pain. 5. Trazodone 50 mg, take half tablet at bedtime. 6. Tamsulosin 0.4 mg, 2 tablets in the morning and 1 tablet at     bedtime. 7. Quetiapine 200 mg half tablet at bedtime. 8. Ferrous gluconate 324 mg b.i.d. 9. Metoprolol 25 mg half tablet b.i.d. 10.Hydrocodone/APAP 5/500 mg 1 tablet b.i.d. 11.Venlafaxine 25 mg, 2 in the morning and 1 at bedtime. 12.Prazosin 1 mg at bedtime.  ALLERGIES:  No known drug allergies.  SOCIAL HISTORY:  He lives with his wife.  He is an ex-smoker.  FAMILY HISTORY:  Significant for CAD.  Father had an MI at age 23.  REVIEW OF SYSTEMS:  Please see HPI.  Denies fever, chills, melena, hematochezia, hematuria, or dysuria.  All other systems reviewed and negative.  PHYSICAL  EXAMINATION:  GENERAL:  He is a well-nourished and well- developed male, in no acute distress. VITAL SIGNS:  Blood pressure is 126/87, pulse 50, respirations 12, oxygen saturation 98% on room air. HEENT:  Normal. NECK:  Without JVD. LYMPH NODES:  Without lymphadenopathy. ENDOCRINE:  Without thyromegaly. CARDIAC:  S1 and S2, regular rate and rhythm without murmurs. LUNGS:  Clear to auscultation bilaterally. SKIN:  Warm and dry without rash. ABDOMEN:  Soft and nontender with normoactive bowel sounds.  No organomegaly. EXTREMITIES:  Without clubbing, cyanosis, or edema. MUSCULOSKELETAL:  Without joint deformity. NEUROLOGIC:  He is alert and oriented x3.  Cranial nerves II through XII grossly intact.  RADIOLOGY:  Chest x-ray, nonacute from December 24, 2010.  Head CT, multiple areas of old infarct and posterior circulation stable, small- vessel disease and old lacunar infarcts.  EKG; sinus bradycardia, heart rate of 47, normal axis, first-degree AV block, no acute  changes.  LABORATORY DATA:  Hemoglobin 13.1, creatinine 1.2, potassium 3.6. Cardiac markers negative x1.  ASSESSMENT AND PLAN: 1. Syncope.  He has a history of orthostatic hypotension and this is     likely the cause of his current event.  He will be admitted to     telemetry and serial cardiac enzymes will be checked.  He is     bradycardiac and his metoprolol will be held.  It is remotely possible the change      in his psych meds caused the syncope today.  He does not have any chest     pain.  He does not have any evidence of ischemia. 2. Three-vessel coronary artery disease.  TCTS will be notified.     Bypass is scheduled for tomorrow, December 28, 2010. 3. Hypertension.  Currently controlled. 4. Hyperlipidemia.  Continue simvastatin. 5. Glucose intolerance.  Covered with sliding scale insulin. 6. Posttraumatic stress disorder.  Continue his current psych meds.     Tereso Newcomer, PA-C   ______________________________ Luis Abed, MD, [facility]    SW/MEDQ  D:  12/27/2010  T:  12/27/2010  Job:  [id number]  Electronically Signed by Tereso Newcomer PA-C on 01/18/2011 08:55:39 AM Electronically Signed by Willa Rough MD FACC on 01/22/2011 01:06:57 PM

## 2011-01-22 NOTE — H&P (Signed)
  NAMEEISEN, ROBENSON NO.:  [id number]  MEDICAL RECORD NO.:  [id number]           PATIENT TYPE:  I  LOCATION:  2034                         FACILITY:  [facility]  PHYSICIAN:  Luis Abed, MD, FACCDATE OF BIRTH:  1945-12-29  DATE OF ADMISSION:  12/27/2010 DATE OF DISCHARGE:                             HISTORY & PHYSICAL   CARDIOLOGIST:  Rollene Rotunda, MD, [facility]  PRIMARY CARE PHYSICIAN:  Located at [place], [place].  CHIEF COMPLAINT:  Syncope.  HISTORY OF PRESENT ILLNESS:  Mr. Lafitte is a 65 year old male with a history of coronary artery disease, who was just discharged from the hospital on December 22, 2010.  Cardiac catheterization during that admission demonstrated 3-vessel CAD.  His Plavix was discontinued and he was scheduled for bypass surgery.  This is actually scheduled for tomorrow.  He has a history of posttraumatic stress disorder.  He can see Psychiatry prior to discharge.  He had some medication changes that were made.  Of note, he changed his venlafaxine to an XR formulation with increased dose to 275 mg daily.  He has not done this because he cannot get that medication from the pharmacy.  He did stop taking venlafaxine 2 days ago for unknown reasons.  He does have a history of orthostatic hypotension.  This morning, he awoke around 2 a.m. to go the bathroom.  He became dizzy and forced himself to go to the   Dictation ended at this point.     Tereso Newcomer, PA-C   ______________________________ Luis Abed, MD, [facility]    SW/MEDQ  D:  12/27/2010  T:  12/28/2010  Job:  [id number]  Electronically Signed by Tereso Newcomer PA-C on 01/18/2011 08:53:47 AM Electronically Signed by Willa Rough MD FACC on 01/22/2011 01:06:55 PM

## 2011-01-26 ENCOUNTER — Encounter: Payer: Self-pay | Admitting: Cardiology

## 2011-01-27 ENCOUNTER — Encounter: Payer: Self-pay | Admitting: Cardiology

## 2011-01-27 ENCOUNTER — Other Ambulatory Visit: Payer: Self-pay | Admitting: Cardiothoracic Surgery

## 2011-01-27 ENCOUNTER — Ambulatory Visit (INDEPENDENT_AMBULATORY_CARE_PROVIDER_SITE_OTHER): Payer: Medicare Other | Admitting: Cardiology

## 2011-01-27 DIAGNOSIS — E785 Hyperlipidemia, unspecified: Secondary | ICD-10-CM

## 2011-01-27 DIAGNOSIS — I10 Essential (primary) hypertension: Secondary | ICD-10-CM

## 2011-01-27 DIAGNOSIS — I6529 Occlusion and stenosis of unspecified carotid artery: Secondary | ICD-10-CM

## 2011-01-27 DIAGNOSIS — I2510 Atherosclerotic heart disease of native coronary artery without angina pectoris: Secondary | ICD-10-CM

## 2011-01-27 MED ORDER — SIMVASTATIN 20 MG PO TABS: 20.0000 mg | ORAL_TABLET | Freq: Every evening | ORAL | Status: DC

## 2011-01-27 NOTE — Assessment & Plan Note (Signed)
He is status post CABG and doing well. He will continue with risk reduction and I will refer him for cardiac rehabilitation.

## 2011-01-27 NOTE — Patient Instructions (Signed)
Start Simvastatin 20 mg once a day Continue other medications as listed You will need to have a carotid doppler study in 6 months and will be called for an appointment You need to see Dr Antoine Poche back in 4 months You have been referred to cardiac rehab at [facility].  They will contact you with further information.

## 2011-01-27 NOTE — Assessment & Plan Note (Signed)
The blood pressure is at target. No change in medications is indicated. We will continue with therapeutic lifestyle changes (TLC).  

## 2011-01-27 NOTE — Assessment & Plan Note (Signed)
His lipids in the hospital where an LDL of 48 and HDL of 28. I will follow this up per guidelines.

## 2011-01-27 NOTE — Progress Notes (Signed)
HPI The patient presents for followup after bypass surgery. He is status post recent bypass surgery. He actually has done quite well with this. He has some mild incisional pain but he is not any chest discomfort consistent with and. He has no substernal chest pressure, neck or arm discomfort. He is not describing any palpitations, presyncope or syncope. He has no PND or orthopnea. He has had a mild cough but no fevers or chills. He has had no wound erythema or age.  No Known Allergies  Current Outpatient Prescriptions  Medication Sig Dispense Refill  . Ascorbic Acid (VITAMIN C) 250 MG tablet Take 250 mg by mouth daily.        . clonazePAM (KLONOPIN) 0.5 MG tablet Take 0.5 mg by mouth 2 (two) times daily as needed.        . clopidogrel (PLAVIX) 75 MG tablet Take 75 mg by mouth daily.        . divalproex (DEPAKOTE) 250 MG EC tablet Take 250 mg by mouth 2 (two) times daily.        . ferrous sulfate 325 (65 FE) MG tablet Take 325 mg by mouth daily with breakfast.        . metoprolol (LOPRESSOR) 50 MG tablet Take 25 mg by mouth 2 (two) times daily.        . QUEtiapine (SEROQUEL) 200 MG tablet Take 100 mg by mouth at bedtime.        . simvastatin (ZOCOR) 20 MG tablet Take 20 mg by mouth at bedtime.        . Tamsulosin HCl (FLOMAX) 0.4 MG CAPS Take 0.4 mg by mouth daily.        Marland Kitchen venlafaxine (EFFEXOR-XR) 150 MG 24 hr capsule Take 150 mg by mouth 2 (two) times daily.        Marland Kitchen DISCONTD: glucose blood (FREESTYLE LITE) test strip Use as instructed       . DISCONTD: Lancets (FREESTYLE) lancets Use as instructed       . cyanocobalamin (COBAL-1000) 1000 MCG/ML injection Inject 1,000 mcg into the muscle every 30 (thirty) days.        Marland Kitchen DISCONTD: finasteride (PROSCAR) 5 MG tablet Take 5 mg by mouth daily.        Marland Kitchen DISCONTD: metFORMIN (GLUCOPHAGE) 850 MG tablet Take 850 mg by mouth 2 (two) times daily with a meal.        . DISCONTD: omega-3 acid ethyl esters (LOVAZA) 1 G capsule Take 2 g by mouth 2 (two) times  daily.        Marland Kitchen DISCONTD: omeprazole (PRILOSEC) 20 MG capsule Take 20 mg by mouth daily.        Marland Kitchen DISCONTD: simvastatin (ZOCOR) 40 MG tablet Take 40 mg by mouth at bedtime.        Marland Kitchen DISCONTD: trazodone (DESYREL) 300 MG tablet Take 300 mg by mouth at bedtime.          Past Medical History  Diagnosis Date  . HLD (hyperlipidemia)   . HTN (hypertension)   . History of TIAs   . GERD (gastroesophageal reflux disease)   . Depression   . PTSD (post-traumatic stress disorder)   . CVA (cerebral vascular accident)   . History of colonic polyps   . Vitamin B12 deficiency   . Chronic leg pain   . Melanoma     L arm  . Anemia     s/p transfusions  . DM2 (diabetes mellitus, type 2)   . COPD (  chronic obstructive pulmonary disease)   . CAD (coronary artery disease)   . Fatty liver disease, nonalcoholic   . Obesity   . Carotid stenosis     Past Surgical History  Procedure Date  . Cholecystectomy   . Coronary artery bypass graft     LIMA to LAD, SVG to OM, SVG to PDA 12/30/10  . Ercp   . Leg surgery     right    ROS:  As stated in the HPI and negative for all other systems.  PHYSICAL EXAM BP 110/60  Pulse 53  Ht 5\' 9"  (1.753 m)  Wt 183 lb (83.008 kg)  BMI 27.02 kg/m2 GENERAL:  Well appearing HEENT:  Pupils equal round and reactive, fundi not visualized, oral mucosa unremarkable, poor dention NECK:  No jugular venous distention, waveform within normal limits, carotid upstroke brisk and symmetric, no bruits, no thyromegaly LYMPHATICS:  No cervical, inguinal adenopathy LUNGS:  Clear to auscultation bilaterally BACK:  No CVA tenderness CHEST:  Well healed sternotomy scar. HEART:  PMI not displaced or sustained,S1 and S2 within normal limits, no S3, no S4, no clicks, no rubs, no murmurs ABD:  Flat, positive bowel sounds normal in frequency in pitch, no bruits, no rebound, no guarding, no midline pulsatile mass, no hepatomegaly, no splenomegaly EXT:  2 plus pulses throughout, no edema,  no cyanosis no clubbing, well healed endoscopic SVG scars SKIN:  No rashes no nodules NEURO:  Cranial nerves II through XII grossly intact, motor grossly intact throughout PSYCH:  Cognitively intact, oriented to person place and time.examj  EKG:  Sinus rhythm, rate 53, axis within normal limits, intervals within normal limits no acute ST-T wave changes.  ASSESSMENT AND PLAN

## 2011-01-27 NOTE — Assessment & Plan Note (Signed)
He has an occluded right carotid and 60-79% left carotid. I will follow this up in 6 months with repeat Dopplers.

## 2011-01-28 ENCOUNTER — Ambulatory Visit (INDEPENDENT_AMBULATORY_CARE_PROVIDER_SITE_OTHER): Payer: Self-pay | Admitting: Cardiothoracic Surgery

## 2011-01-28 ENCOUNTER — Ambulatory Visit
Admission: RE | Admit: 2011-01-28 | Discharge: 2011-01-28 | Disposition: A | Discharge status: Home / Self Care | Payer: Medicare Other | Source: Ambulatory Visit | Attending: Cardiothoracic Surgery | Admitting: Cardiothoracic Surgery

## 2011-01-28 DIAGNOSIS — I2510 Atherosclerotic heart disease of native coronary artery without angina pectoris: Secondary | ICD-10-CM

## 2011-01-28 NOTE — Assessment & Plan Note (Signed)
OFFICE VISIT  [name], [name] DOB:  09-Feb-1946                                        January 28, 2011 CHART #:  [id number]  The patient returns to the office today.  He underwent coronary artery bypass grafting on December 28, 2010.  Preoperatively, he has a long complicated psychiatric history with posttraumatic stress disorder and was evaluated by Psychiatry, Dr. Marlis Edelson preoperatively. Postoperatively, the patient has done extremely well.  He has made excellent progress at home.  He has had no recurrent angina.  He is increasing his physical activity appropriately.  He is to start in cardiac rehab program next week.  On exam, his blood pressure is 99/67, pulse is 64, respiratory rate 16, O2 sats 99%.  He has notes that he is not smoking and quit more than 30 years ago.  His sternum is stable and well healed.  His lungs are clear bilaterally.  His abdominal exam is benign.  He has no edema in the lower extremities and is endovein harvest site is healing well.  Followup chest x-ray shows clear lung fields bilaterally.  The patient's medications are reviewed in the EPIC system.  He remains on his psychiatric medications.  In addition, he notes that he has taken his metoprolol, simvastatin, and aspirin 81 mg a day.  Overall, I am very pleased with his progress.  Encouraged him to be actively involved in the cardiac rehab program.  He saw Dr. Huston Foley from Psychiatry standpoint in the hospital, both he and his family were impressed with his care and noted that he had now moved into the [facility] specializing treatment of posttraumatic stress disorder and we will talk to Dr. [place] Franklin, [facility] about [facility] his psychiatric care to Dr. Huston Foley, who is currently working in [place], but he was planned to move to the [facility] when it is completed.  The patient has some degree of bilateral carotid disease found on  his screening Dopplers.  He notes that Dr. Antoine Poche, has made arrangements for followup on this and noted that he was going to be enrolled in a study.  I will leave it up to Dr. Antoine Poche to make arrangements for the followup of his carotid disease.  Sheliah Plane, MD Electronically Signed  EG/MEDQ  D:  01/28/2011  T:  01/28/2011  Job:  [id number]  cc:   Benjamin Rotunda, MD, [facility] Dr. [place] Franklin Benjamin Brady, DO

## 2011-01-29 NOTE — Cardiovascular Report (Signed)
  [name], [name]               ACCOUNT NO.:  [id number]  MEDICAL RECORD NO.:  [id number]           PATIENT TYPE:  I  LOCATION:  2007                         FACILITY:  [facility]  PHYSICIAN:  Rollene Rotunda, MD, FACCDATE OF BIRTH:  09-09-1946  DATE OF PROCEDURE:  12/18/2010 DATE OF DISCHARGE:                           CARDIAC CATHETERIZATION   PROCEDURE:  Left heart catheterization/coronary arteriography.  INDICATIONS:  Evaluate the patient with chest pain and known previous nonobstructive coronary artery disease.  PROCEDURE NOTE:  Left heart catheterization performed in the right femoral artery.  The artery was cannulated using the anterior wall puncture.  A #5-French arterial sheath was inserted via the modified Seldinger technique.  Preformed Judkins and pigtail catheter utilized. The patient tolerated the procedure well, left the lab in stable condition.  RESULTS:  Hemodynamics:  LV 152/9, AO 160/71.  Coronaries, left main was normal with 25% calcified stenosis.  The LAD had proximal to moderate diffuse disease.  There was mid long 75% stenosis at the mid diagonal. First diagonal was very small.  Second diagonal was moderate sized with luminal irregularities.  The circumflex was a dominant vessel.  In the AV groove, there were diffuse 25-30% lesions proximal and mid.  There was distal 75% stenosis.  The mid obtuse marginal was large with proximal 70% stenosis.  PDA was large with proximal to mid 80% stenosis. The right coronary artery is nondominant with proximal 90% stenosis. The EF was 65%.  CONCLUSION:  Three vessel equivalent coronary artery disease with preserved ejection fraction.  I have reviewed the films versus 2005 films.  His disease has progressed fairly significantly since then. Given the unstable nature of his symptoms, I think CABG is indicated.  I will consult cardiovascular surgery.     Rollene Rotunda, MD, [facility]     JH/MEDQ  D:  12/18/2010  T:   12/19/2010  Job:  [id number]  Electronically Signed by Rollene Rotunda MD [facility] on 01/29/2011 11:42:34 AM

## 2011-01-29 NOTE — H&P (Addendum)
[name], [name] NO.:  [id number]  MEDICAL RECORD NO.:  [id number]           PATIENT TYPE:  E  LOCATION:  [facility]                         FACILITY:  [facility]  PHYSICIAN:  Rollene Rotunda, MD, FACCDATE OF BIRTH:  26-Jul-1946  DATE OF ADMISSION:  12/17/2010 DATE OF DISCHARGE:                             HISTORY & PHYSICAL   PRIMARY CARDIOLOGIST:  Rollene Rotunda, MD, [facility]  CHIEF COMPLAINT:  Chest pain.  HISTORY OF PRESENT ILLNESS:  A 65 year old white male with the past medical history significant for moderate nonobstructive coronary artery disease by cardiac catheterization in 2005 as well as a history of hypertension, hyperlipidemia who presents for evaluation of chest discomfort.  Historically, the patient has not had any chest pain since his last cardiac catheterization.  Today, he was mowing his grass for the first time in 2012.  He reports that the grass is very tall and wet and he had significant difficulty using his lawnmower.  While mowing his grass, he developed severe 10/10 substernal band-like chest pressure that radiated around his chest.  He had severe diaphoresis, some mild shortness of breath.  He felt mildly presyncopal during this episode and this lasted approximately 1 hour and symptoms subsided.  Later this afternoon, the patient's wife noted that he had some mild slurred speech, but otherwise was in his usual state of health.  Currently, the patient is chest pain free.  PAST MEDICAL HISTORY: 1. Moderate nonobstructive coronary artery disease by cardiac     catheterization in 2005.  He had moderate calcium deposition of his     left main with a 30% lesion in his left main.  He had 30-50% in LAD     and 60-70% in proximal OM1.  He had left dominant system. 2. Hypertension. 3. Hyperlipidemia. 4. History of orthostatic hypotension. 5. History of transient ischemic attack. 6. Prediabetes. 7. Post-traumatic stress disorder. 8. Depression. 9.  Anxiety.  SOCIAL HISTORY:  The patient lives at home with his wife.  He has a 30- pack-year history of smoking, but quit approximately 30 years ago.  He denies any alcohol or drug use.  FAMILY HISTORY:  Notable for father with myocardial infarction at age 77.  There was no history of premature coronary artery disease.  ALLERGIES:  No known drug allergies.  MEDICATIONS: 1. Venlafaxine 75 mg b.i.d. 2. Triamcinolone topical ointment. 3. Trazodone 25 mg nightly. 4. Flomax 0.4 mg daily. 5. Simvastatin 40 mg daily. 6. Seroquel 100 mg daily. 7. Plavix 75 mg daily. 8. Metoprolol tartrate 25 mg daily. 9. Vicodin p.r.n. 10.Valproic 125 mg b.i.d. 11.Clonazepam 1 mg daily.  REVIEW OF SYSTEMS:  All systems were reviewed and are negative as mentioned above in history of present illness.  PHYSICAL EXAMINATION:  VITAL SIGNS:  Temperature afebrile, initial blood pressure was 83/57 with a subsequently increased to 133/63, heart rate 79, respirations 18, O2 saturation 97% on room air. GENERAL:  No acute distress. HEENT:  Normocephalic atraumatic.  Pupils are equal, round and reactive to light and accommodation.  Oropharynx pink and moist without any lesions. NECK:  Supple.  No lymphadenopathy.  No jugular venous  distention.  No masses. CARDIOVASCULAR:  Regular rate and rhythm with no murmurs, rubs or gallops. CHEST:  Clear to auscultation bilaterally. ABDOMEN:  Positive for bowel sounds, soft, and nondistended. EXTREMITIES:  No clubbing, cyanosis, or edema.  Dorsalis pedis pulse 2+ bilaterally. SKIN:  No rashes. BACK:  No CVA tenderness.  Peripheral pulse exam demonstrates 2+ radial pulses bilaterally with intact palmar arches by Allen's testing, 2+ femoral pulses bilaterally without evidence of bruits. NEUROLOGIC:  Cranial nerves II through XII grossly intact with no focal deficits. PSYCHIATRY:  Normal affect.  PERTINENT DATA:  EKG shows sinus rhythm at 78 beats per  minute, incomplete right bundle-branch block, no signs of ischemia.  LABORATORY STUDIES:  Notable for normal CBC, BUN 18, creatinine 0.6, troponin less than 0.05.  Chest x-ray, no acute cardiopulmonary disease.  IMPRESSION AND PLAN:  [name] is a 65 year old white male with past medical history significant for moderate nonobstructive coronary artery disease who presents for evaluation of chest pain that is concerning for unstable angina. 1. We will admit the patient to [facility] under Dr.     Jenene Slicker care. 2. Chest pain.  By history, this is concerning to me for unstable     angina.  Additionally, given his risk factor profile and his     cardiac catheterization results from 2005, I felt that it would be     best to manage with an invasive evaluation with coronary     angiography to define his coronary anatomy.  I discussed this with     the patient and he is agreeable with this plan.  In the interim     time, we will continue on aspirin.  We will give him an additional     load of 300 mg of Plavix and continue him on his home dose of 75 mg     daily, and we started systemic anticoagulation with IV heparin, and     we will continue him on his home dose of beta-blocker and statin     therapy.  We will cycle cardiac enzymes and should he undergo     cardiac catheterization tomorrow, this study can be performed by     the radial or the femoral approach.  There are no contraindications     to drug-eluting stents.  We will start the patient on gentle IV     fluids tonight as his creatinine is mildly elevated at 1.6. 3. Hypertension under good control.  This should be noted that he was     mildly hypotensive when he came to the emergency department.     Certainly orthostatic hypotension could possibly account for some     of his symptoms today; however, we will continue on his home     medications as listed above. 4. Hyperlipidemia.  Check fasting lipid profile.  Continue  home     simvastatin. 5. Fluids, electrolytes, nutrition.  Normal saline at 75 mL/hour,     electrolytes are stable.  N.p.o. after midnight. 6. Deep venous thrombosis prophylaxis, not indicated as the patient is     on systemic anticoagulation with heparin.     Therisa Doyne, MD   ______________________________ Rollene Rotunda, MD, [facility]    SJT/MEDQ  D:  12/17/2010  T:  12/18/2010  Job:  [id number]  Electronically Signed by Rollene Rotunda MD [facility] on 01/29/2011 11:42:32 AM Electronically Signed by Aldona Bar MD on 02/01/2011 09:54:57 PM

## 2011-01-29 NOTE — Discharge Summary (Signed)
[name], [name]               ACCOUNT NO.:  [id number]  MEDICAL RECORD NO.:  [id number]           PATIENT TYPE:  I  LOCATION:  2007                         FACILITY:  [facility]  PHYSICIAN:  Rollene Rotunda, MD, FACCDATE OF BIRTH:  [birth year]  DATE OF ADMISSION:  12/17/2010 DATE OF DISCHARGE:  12/22/2010                              DISCHARGE SUMMARY   PRIMARY CARDIOLOGIST:  Dr. Antoine Poche.  CONSULTING CARDIOTHORACIC SURGEON:  Sheliah Plane, MD  Primary care and Psychiatry is provided at the [facility].  DISCHARGE DIAGNOSIS:  Unstable angina.  SECONDARY DIAGNOSES: 1. Multivessel coronary artery disease with pending coronary artery     bypass grafting tentatively scheduled for December 28, 2010. 2. Hypertension. 3. Hyperlipidemia. 4. History of orthostatic hypotension. 5. History of transient ischemic attack and cerebrovascular disease. 6. Borderline diabetes mellitus. 7. Post-traumatic stress disorder. 8. Generalized anxiety disorder. 9. Depression.  ALLERGIES:  NO KNOWN DRUG ALLERGIES.  PROCEDURES: 1. Left heart cardiac catheterization performed December 18, 2010,     revealing significant LAD, left circumflex, and right coronary     artery disease.  EF was 65%.  Patient is pending bypass surgery. 2. Pre-CABG Dopplers including carotid ultrasound showing an occluded     right internal carotid artery with a 60-79% stenosis of the left     internal carotid artery.  There was bilateral antegrade flow in the     vertebral arteries.  Normal bilateral flow in the upper and lower     extremities.  HISTORY OF PRESENT ILLNESS:  A 65 year old male with prior history of nonobstructive CAD, hypertension and hyperlipidemia, who was in his usual state of health until December 17, 2010, when he was mowing his grass and had sudden onset of 10/10 substernal band-like chest pressure with diaphoresis and dyspnea as well as mild presyncope.  Symptoms lasted approximately 1 hour, resolved  with rest.  Later in the afternoon, the patient's wife noted that he may have been slurring his speech and this prompted them to present to the [facility] ED.  In the ED, the patient was chest pain free.  ECG showed no acute changes while enzymes were negative.  The patient was admitted for the evaluation.  HOSPITAL COURSE:  On admission, the patient ruled out for MI.  Due to have one additional episode of substernal chest discomfort and was felt that given his prior history and risk factors, he would benefit from cardiac catheterization.  Catheterization was performed on December 18, 2010, showing moderate three- vessel obstructive coronary artery disease including a 75% stenosis and a medium-sized diagonal branch, 75% distal stenosis in the left circumflex, 75% proximal stenosis and a large obtuse marginal, and 80% stenosis in the LPDA, and 90% proximal and mid stenosis in the right coronary artery.  This was a nondominant vessel.  EF was 65%.  In the setting of three-vessel disease, cardiothoracic surgical consultation was sought and it was felt that the patient would likely benefit from coronary artery bypass grafting, however, with his history of anxiety, depression and PTSD, it was unclear as to his insight into his cardiovascular illness and also his capacity to  make medical decisions. As such, Psychiatry was consulted and the patient was seen by Dr. Marlis Edelson on December 20, 2010.  It was felt that the patient both had appropriate insight and capacity to make medical decisions.  Several recommendations were made for adjustment of his medications for management of depression, anxiety and PTSD.  These adjustments have been carried out and the patient has been tolerating the change as well.  The patient has had no additional chest discomfort.  He has undergone pre-CABG Dopplers revealing significant cerebrovascular disease as outlined above.  He otherwise has normal arterial flow in  his extremities.  Patient is tentatively scheduled for coronary artery bypass grafting on December 28, 2010.  He is felt to be stable for discharge today in the absence of rest angina.  Further, the patient had been on chronic Plavix therapy and was loaded with 300 mg additional Plavix on admission.  His P2Y12 is 195 with a 40% inhibition.  As the patient will have to wait for Plavix washout, he will be discharged home today with plan for presurgical evaluation on March 22 and subsequently bypass surgery on December 28, 2010.  DISCHARGE LABS:  Hemoglobin 12.3, hematocrit 36.8, WBC 5.9, platelets 141,000.  Sodium 141, potassium 4.0, chloride 109, CO2 of 28, BUN 11, creatinine 1.07, glucose 118, total bilirubin 0.3, alkaline phosphatase 37, AST 13, ALT 11, total protein 5.8, albumin 3.5, calcium 8.9, magnesium 2.0, hemoglobin A1c 6.3, CK 44, MB 1.8, troponin-I 0.02, total cholesterol 107, triglycerides 154, HDL 28, LDL 48.  TSH 0.93, vitamin B12 546, serum folate 6.2, PSA 1.33.  Urinalysis was negative.  MRSA screen was negative.  DISPOSITION:  The patient will be discharged to home today in good condition.  FOLLOWUP PLANS AND APPOINTMENTS:  The patient will follow up with [facility] on December 24, 2010, for preoperative testing.  He will return to [facility] on December 28, 2010, for coronary artery bypass grafting.  Further details regarding this will be provided him on December 24, 2010, by Thoracic Surgery.  He has scheduled follow up with Dr. Antoine Poche on January 27, 2011, at 10:30 a.m..  He will continue to follow up with the [facility] for his primary and psychiatry care.  DISCHARGE MEDICATIONS: 1. Aspirin 81 mg daily. 2. Imdur 30 mg daily. 3. Nitroglycerin 0.4 mg sublingual p.r.n. chest pain. 4. Prazosin 1 mg nightly for nightmares. 5. Clonazepam 1 mg half tablet on December 23, 2010, then discontinue. 6. Divalproex 250 mg half a tablet on December 23, 2010,  then     discontinue. 7. Metoprolol tartrate 25 mg half tablet b.i.d. 8. Flomax 0.4 mg 2 capsules in the morning, 1 capsule in the evening. 9. Venlafaxine XR 150 mg 1 tablet on December 23, 2010, then one and a     half capsules daily. 10.Hydrocodone/APAP 5/500 mg b.i.d. 11.Seroquel 200 mg half a tablet daily. 12.Simvastatin 80 mg half a tablet daily. 13.Trazodone 50 mg half a tablet nightly. 14.Triamcinolone topical 0.1% apply daily. 15.Plavix has been discontinued.  OUTSTANDING LAB STUDIES:  None.  DURATION DISCHARGE ENCOUNTER:  60 minutes including physician time.     Nicolasa Ducking, ANP   ______________________________ Rollene Rotunda, MD, [facility]    CB/MEDQ  D:  12/22/2010  T:  12/23/2010  Job:  [id number]  cc:   Sheliah Plane, MD [facility]  Electronically Signed by Nicolasa Ducking ANP on 01/12/2011 04:04:38 PM Electronically Signed by Rollene Rotunda MD [facility] on 01/29/2011 11:42:36 AM

## 2011-02-15 ENCOUNTER — Ambulatory Visit (HOSPITAL_COMMUNITY): Payer: Federal, State, Local not specified - PPO

## 2011-02-16 NOTE — Consult Note (Signed)
NAMEJAKYRI, BRUNKHORST               ACCOUNT NO.:  [id number]   MEDICAL RECORD NO.:  [id number]          PATIENT TYPE:  INP   LOCATION:  2926                         FACILITY:  [facility]   PHYSICIAN:  Lucrezia Starch. Earlene Plater, M.D.  DATE OF BIRTH:  13-Sep-1946   DATE OF CONSULTATION:  DATE OF DISCHARGE:                                 CONSULTATION   Ask to consult for urinary difficulties, traumatic Foley removal and  increased PSA.   Patient was admitted for fever, chills, rigor with reported temperature  of 105.  Prior to admission, acute onset of weakening stream, dribbling,  suprapubic fullness and eventual retention.  Prior to this episode,  patient states he had experienced trouble urinating over the last year  with a decreased force of stream what feels like incomplete emptying and  dribbling.  Patient has never stopped urinating, denies history of  BPH, prostate cancer, no family history of prostate cancer, denies  history of hematuria prior to hospitalization, has never required  catheterization before.   Patient pulled catheter out last night while trying to get up.  Patient  states he was confused over the night and this morning.  Condom cath was  placed by RN, approximately 100 mL in the catheter bag since that  placement sometime in the early morning hours.  Patient feels that he  needs to urinate now, but unable to do so.  RN reports multiple loose  stools and questioning if urine has passed at that point.   PAST MEDICAL HISTORY:  Significant for:  1. Hypertension.  2. Diabetes.  3. Coronary artery disease.  4. TIA.  5. Myocardial infarction.   MEDICATIONS:  Include:  1. Plavix.  2. Vicodin.  3. Nitroglycerin.  4. Clonazepam.  5. Omeprazole.  6. Zocor.  7. Effexor.   SURGICAL HISTORY:  Unknown.   ALLERGIES:  None known.   SOCIAL HISTORY:  He lives with his wife, remote smoking history 25 years  ago and ETOH use 25 years ago.   FAMILY HISTORY:  No prostate problems  or history of prostate cancer.   REVIEW OF SYSTEMS:  Patient denies shortness of breath or chest pain,  had some suprapubic tenderness prior to catheterization placement,  negative chest pain, negative shortness of breath as in HPI, otherwise  benign.   PHYSICAL EXAMINATION:  GENERAL:  This is a 65 year old Caucasian male.  He is in no acute distress.  He is alert and oriented x3 during  interview.  HEENT:  Normocephalic.  CHEST:  Normal motion.  ABDOMEN:  Mild suprapubic distension with suprapubic pressure with  palpation prior to catheterization.  Once catheterization complete,  pressure is resolved.  GU:  Penis uncircumcised.  No abnormality.  RECTAL:  Prostate +1 and benign.   Abdominal ultrasound including kidneys shows no hydro; right upper pole  cyst; right kidney measures 10.4 cm; left kidney measures 11.8 cm with a  left hypoechogenicity, most likely artifact.  Recommend repeat  ultrasound in three months.   Preliminary urinary culture showed E. coli.  Final urinary culture shows  no growth.  Blood culture shows E. coli.  White count 8.5, hemoglobin 9.9, hematocrit 29.2, platelets 80.  Sodium  139, potassium 3.5, chloride 108, CO2 23, BUN 20, creatinine 1.39,  glucose 100.  PSA 13.74.   IMPRESSION:  1. Urinary retention.  2. Elevated PSA.  3. Left kidney echogenicity, most likely benign artifact.  4. Increased creatinine.  5. Urosepsis.   PLAN:  1. An 18-French Foley was placed without difficulty with return of 4      to 500 mL.  2. Begin Flomax 0.4 mg one p.o. daily.  3. Prior to discharge, attempt voiding trial.  If patient is able to      urinate, may leave Foley catheter out.  If patient unable, would      reinsert Foley catheter.  4. Elevated PSA needs to be evaluated when patient no longer has the      catheter in place.  5. Please have patient follow up at [facility] with either Dr.      Earlene Plater or Cammy Copa, [facility].  6. Will recheck PSA and  renal ultrasound in office and if patient      fails voiding trial, may need urodynamic study at that point.     ______________________________  Alessandra Bevels. Chase Picket, FNP-C      Ronald L. Earlene Plater, M.D.  Electronically Signed    JML/MEDQ  D:  06/13/2007  T:  06/13/2007  Job:  [id number]

## 2011-02-16 NOTE — Consult Note (Signed)
[name], [name]               ACCOUNT NO.:  [id number]   MEDICAL RECORD NO.:  [id number]          PATIENT TYPE:  OBV   LOCATION:  3735                         FACILITY:  [facility]   PHYSICIAN:  Iva Boop, MD,FACGDATE OF BIRTH:  [birth year]   DATE OF CONSULTATION:  DATE OF DISCHARGE:                                 CONSULTATION   PRIMARY CARE PHYSICIAN:  Dr. Laury Axon in [place] and Dr. Renato Gails at the  [facility].   Note, please see the hospital records for full details of this  consultation note performed by my physician assistant.  This is a  summary note.   REASON FOR CONSULTATION:  Microcytic anemia, rectal bleeding, and  abdominal pain.   ASSESSMENT:  A 65 year old disabled [place] veteran who takes Plavix for  a history of supposed TIAs and was admitted to the hospital with the  microcytic anemia.  Hemoglobin was 8.1 with MCV 63.  He was transfused 2  units of packed red cells, hemoglobin 9.1, and has received 2 more units  of packed red cells with hemoglobin pending.   He relates a history of on and off dark stools with questionable melena  and rectal bleeding.  He is Hemoccult negative here.  He is complaining  of some intermittent early satiety, but not consistent.  His weight has  fluctuated but has not persistently decreased.  Generally tends towards  constipation with somewhat worsening intervals between bowel movements  from every 1-3 days to every 4-5 days now.   Overall, this man has a microcytic anemia, presumed to be iron  deficiency of unclear etiology though occult blood loss anemia certainly  possible and he has intermittent rectal bleeding and possible melena  over the last year or so.  His hemoglobin was in the 10 range last fall.  He has been on Plavix.  When questioned, it is not entirely clear that  he ingests much iron in his diet.  He is also on a B12 supplement.   RECOMMENDATIONS AND PLAN:  Colonoscopy and probable upper GI endoscopy  are indicated  to work this up.  However, given that he is on Plavix, it  has been explained to the patient that it makes sense to hold the Plavix  prior to the procedure.  He understands that there is an increased risk  of possible TIA or stroke coming off the Plavix, but this is the most  appropriate course of action to avoid having repeat endoscopic  procedures since Plavix increases the risk of bleeding with polypectomy  and other therapeutic procedures.  Other typical risks of endoscopic  procedures including bleeding, medication reaction, and perforation  possibly for surgery were explained to the patient and his wife and they  understand and agreed.   So, he will be discharged from the hospital today, I will arrange for  outpatient colonoscopy and EGD.  The patient and his wife understand the  plan.  I have discussed this with Dr. Posey Rea.  We will hold his  Plavix for the time being and try to coordinate his procedures  relatively soon after he has been  off them for a week.   MEDICATIONS:  At discharge will include:  1. Omeprazole.  2. Metoprolol.  3. Finasteride.  4. Clonazepam.  5. Venlafaxine.  6. Cyanocobalamin.  7. Hydrocodone.  8. NitroQuick.  9. Trazodone.  10.Relacore.  11.Vitamin B.   ALLERGIES:  There are no known drug allergies.   PAST MEDICAL HISTORY:  Post-traumatic stress disorder, dyslipidemia,  nonobstructive coronary artery disease, hypertension, transient ischemic  attacks, remote history of colon polyps at [facility], colonoscopy more  than 5 years ago, gastroesophageal reflux disease, vitamin B12  deficiency, left arm melanoma excised 3 weeks ago at the [facility],  chronic leg pain, obesity, fatty liver, benign prostatic hypertrophy,  abdominal aortic aneurysm on renal cysts, acute pyelonephritis in the  past with E. coli sepsis and cholestasis related to that.  He is status  post cholecystectomy.   SOCIAL HISTORY:  No alcohol or tobacco in greater than 30  years.  He is  married and lives with his wife.   FAMILY HISTORY:  Congestive heart failure.  No GI problems.  Coags were  normal.  BMET normal.  LFTs normal.   PERTINENT PHYSICAL FINDINGS:  Included possible tardive dyskinesia,  benign abdomen, healing scar from his left melanoma in resection on the  left forearm.   I appreciate the opportunity of the care of this patient.      Iva Boop, MD,FACG  Electronically Signed     CEG/MEDQ  D:  02/25/2008  T:  02/25/2008  Job:  [id number]   cc:   Georgina Quint. Plotnikov, MD

## 2011-02-16 NOTE — Discharge Summary (Signed)
[name], [name]               ACCOUNT NO.:  [id number]   MEDICAL RECORD NO.:  [id number]          PATIENT TYPE:  OBV   LOCATION:  3735                         FACILITY:  [facility]   PHYSICIAN:  Iva Boop, MD,FACGDATE OF BIRTH:  December 26, 1945   DATE OF ADMISSION:  02/24/2008  DATE OF DISCHARGE:  02/25/2008                               DISCHARGE SUMMARY   ADMITTING DIAGNOSES:  1. Microcytic anemia.  Reports of periodic rectal bleeding, but      currently fecal occult blood negative.  2. History of colon polyps with the last colonoscopy more than 5 years      ago at the [place].  3. Fatty liver.  With normal LFTs and normal coagulation parameters.  4. Sepsis associated thrombocytopenia in September 2008, currently      platelets normal.  5. Recent excision of melanoma from the left arm.  6. Posttraumatic stress disorder.  The patient is a [place] [employer]      veteran.  7. Hyperlipidemia.  8. Hypertension.  9. Nonobstructive coronary artery disease.  10.History of cerebrovascular accident and transient ischemic attack.  11.Gastroesophageal reflux disease.  12.Vitamin B12 deficiency.  13.Chronic leg pain.  14.Obesity.  15.Melanoma recent excision about 3 weeks ago at the [place].  16.Status post cholecystectomy.  17.Abdominal aortic aneurysm measuring 3.2 cm on an ultrasound in      September 2008.  This was not visualized on an MRI in September      2008.  18.Right renal cyst.  19.Benign prostatic hypertrophy.  20.E. coli sepsis, acute pyelonephritis September 2008.  21.Sepsis associated cholecystitis September 2008.  22.The patient on chronic Plavix.   DISCHARGE DIAGNOSES:  1. Microcytic anemia with history of rectal bleeding, but heme      negative currently.  2. The patient is status post transfusion with 1, 2, 3, and 4 units of      packed red blood cells.   PLAN:  Outpatient workup with colonoscopy when he has been off Plavix  for an extended period of time.   BRIEF HISTORY:   [name] is a 65 year old gentleman with above-stated  past medical history.  The patient developed weakness, stumbling several  days prior to admission.  Hemoglobin on May 23 was 8.1 that compares  with a level of 10.2, September 2008.  He describes periodic red blood  mixed in stool of small periods of bleeding with small quantity.  He had  not seen any blood for several weeks.  He reported some dark stools in  the fall of 2008, but none in 2009.  He denied nausea, vomiting,  abdominal pain, dysphagia, or pyrosis.  He did have some early satiety  and he had self-sustated weight loss of around 250 pounds in the fall of  2008, down to about 185 a few months later, but back up to about 205 on  May 23.  The patient generally was constipated.  He was admitted to the  hospital by a Dr. Posey Rea for transfusion and for GI assessment.  Ultimately, GI service took the patient on their own service.   LABORATORIES:  Initial  hemoglobin 8.1, hematocrit 26.6, and MCV 63.6.  On recheck following transfusions, hemoglobin was 10.2 and hematocrit  32.2.  White blood cell count 9.5, platelets ranging 149,000-199,000.  Fecal occult blood negative.  PT is 13.3, INR 1.0, PTT 30.  Sodium 136,  potassium 4.2.  Chloride 106, CO2 21.  Serum glucose maximum of 161, low  of 93.  BUN 15, creatinine 1.4.  Urinalysis negative.  LFTs within  normal limits.  Troponin-I less than 0.05.  Myoglobin 83.4 and CK-MB  less than 1.0.   HOSPITAL COURSE:  1. Anemia.  The patient was treated with transfusions received a total      of 4 units of packed red blood cells.  His hemoglobin registered a      suboptimal response as his hemoglobin only jumped 2 g after the      transfusions.  Because the patient was on Plavix, it was decided      that he would be discharged home and follow up with Dr. Leone Payor in      the office for colonoscopy and upper endoscopy.  Dr. Marvell Fuller      office was to contact the patient around May 26  or May 27, with      appointments for the preprocedure visit with the nurse as well as      for the actual colonoscopy and upper endoscopy to be performed      after the patient was off Plavix.   MEDICATIONS AT DISCHARGE:  1. Omeprazole 20 mg twice daily.  2. Metoprolol 25 mg twice daily.  3. Finasteride 5 mg once daily at bedtime.  4. Clonazepam 0.25 mg at bedtime.  5. Venlafaxine 150 mg twice daily.  6. Cyanocobalamin 1000 mcg injected monthly injected IM monthly.  7. Hydrocodone 5/500 1-2 every 12 hours as needed.  8. NitroQuick 0.4 mg sublingually as needed for chest pain.  9. Trazodone 50 mg 1/2 tablet at bedtime.  10.Relacore Extra one daily.  11.Plavix 75 mg daily.  He was instructed not to continue taking      Plavix until he was okay to restart this medication.   CONDITION AT DISCHARGE:  Stable and improved.   DIET AT DISCHARGE:  Low fiber.      Jennye Moccasin, PA-C      Iva Boop, MD,FACG  Electronically Signed    SG/MEDQ  D:  03/26/2008  T:  03/27/2008  Job:  [id number]   cc:   Iva Boop, MD,FACG  Lelon Perla, DO

## 2011-02-16 NOTE — H&P (Signed)
[name], [name]               ACCOUNT NO.:  [id number]   MEDICAL RECORD NO.:  [id number]           PATIENT TYPE:   LOCATION:                                 FACILITY:   PHYSICIAN:  Della Goo, M.D. DATE OF BIRTH:  [birth year]   DATE OF ADMISSION:  06/12/2007  DATE OF DISCHARGE:                              HISTORY & PHYSICAL   This is an unassigned patient.   CHIEF COMPLAINTS:  Fevers and chills.   HISTORY OF PRESENT ILLNESS:  This is a 65 year old male presenting to  the emergency department secondary to complaints of fevers and chills  which started within the last 24 hours.  The patient reports having a  fever to 105 at home.  He reports being sick, feeling nauseous and  having difficulty urinating.  He denies having any vomiting or diarrhea.  He does report having a severe headache as well.  The patient reports  having chills and starting to shake and feeling very cold.  In the  emergency department, the patient was found to have a temperature  initially of 105.1 rectally.  Blood cultures were sent and the patient  was placed empirically on Rocephin 1 g IV x1 dose.  He was referred for  admission.   PAST MEDICAL HISTORY:  1. Coronary artery disease.  2. Borderline type 2 diabetes mellitus.  3. Hypertension.  4. A history of TIAs.  5. Post traumatic stress disorder.   MEDICATIONS:  1. Effexor XR 150 mg 1 p.o. b.i.d.  2. Plavix 75 mg 1 p.o. q. day.  3. Simvastatin 80 mg 1 p.o. q. day.  4. Vicodin 5/500 one  p.o. q.6 hours p.r.n. severe pain.  5. Nitroglycerin 0.4 mg 1 sublingual q.5 minutes x3 p.r.n. chest pain.  6. Klonopin 0.5 mg one-half a tablet p.o. b.i.d.  7. Omeprazole 20 mg 1 p.o. b.i.d.  8. Trazodone 300 mg 1 p.o. nightly.  9. Ergocalciferol 5000 units 1 p.o. q. month.   ALLERGIES:  No known drug allergies.   SOCIAL HISTORY:  The patient is married.  He is a nonsmoker, nondrinker.   PAST SURGICAL HISTORY:  History of a cholecystectomy and a  pilonidal  cyst excision.   FAMILY HISTORY:  Noncontributory.   REVIEW OF SYSTEMS:  Pertinents are mentioned above.   PHYSICAL EXAMINATION FINDINGS:  This is a 65 year old obese male in  discomfort, but no acute distress.  His temperature initially 105.1 rectally with a blood pressure of 92/45  and heart rate 124, respirations 22, O2 saturation 98 % to 100%.  HEENT EXAMINATION:  Normocephalic, atraumatic.  Pupils equally round,  reactive to light.  There is no scleral icterus.  Funduscopic benign.  Oropharynx clear.  NECK:  Supple.  Full range of motion.  No thyromegaly, adenopathy,  jugular venous distention.  CARDIOVASCULAR:  Tachycardiac rate, rhythm.  No murmurs, gallops or  rubs.  LUNGS: Clear to auscultation bilaterally.  ABDOMEN:  Positive bowel sounds.  Soft, nontender, nondistended.  EXTREMITIES: Without cyanosis, clubbing or edema.  NEUROLOGIC EXAMINATION:  Mild generalized weakness.  The patient is  alert.  He is mildly  confused.  His speech is clear.  There are no focal  deficits.   LABORATORY STUDIES:  Sodium 133, potassium 3.3, chloride 101,  bicarbonate 16.4, BUN 19, creatinine 1.6, glucose 140.  Urinalysis:  Positive urine nitrites and small leukocyte esterase.  Lipase 12.  Cardiac enzymes:  Myoglobin greater than 500, CK-MB less than 1,  troponin less than 0.05.  Albumin 3.1, AST 31, ALT 22, alkaline  phosphatase 162, bilirubin 2.2, direct bilirubin 1.1, indirect bilirubin  1.1.  Chest x-ray reveals no acute disease process.   ASSESSMENT:  [age]-year-old male being admitted with:  1. Sepsis.  2. Urinary tract infection versus prostatitis.  3. Borderline type 2 diabetes mellitus.  4. Hypotension.   PLAN:  Blood cultures have been sent along with urine cultures, and the  patient has already receiving IV antibiotic therapy of Rocephin x1 dose.  Vancomycin, Zosyn and ciprofloxacin have also been ordered empirically.  These medications will continue, will be  adjusted pending culture  results.  The patient has been placed on IV fluids for fluid  resuscitation and maintenance therapy.  Sliding-scale insulin coverage  has also been ordered p.r.n. elevated blood sugars.  He will continue on  his regular medications, and DVT and GI prophylaxis have been ordered.  The patient appears to be improving with IV fluids and administration of  the IV antibiotic therapy.  A PSA will also be checked with the  laboratories ordered for the a.m.      Della Goo, M.D.  Electronically Signed     HJ/MEDQ  D:  06/12/2007  T:  06/13/2007  Job:  [id number]

## 2011-02-16 NOTE — Discharge Summary (Signed)
[name], [name]               ACCOUNT NO.:  [id number]   MEDICAL RECORD NO.:  [id number]          PATIENT TYPE:  INP   LOCATION:  4741                         FACILITY:  [facility]   PHYSICIAN:  Hettie Holstein, D.O.    DATE OF BIRTH:  1946-07-05   DATE OF ADMISSION:  06/11/2007  DATE OF DISCHARGE:                               DISCHARGE SUMMARY   PRIMARY CARE PHYSICIAN:  [facility] and Dr. Marga Melnick.  Though he  has not seen Dr. Alwyn Ren in quite some time, he desires to reestablish  with Dr. Alwyn Ren.  I have contacted Dr. Frederik Pear office and Dr. Loreen Freud will be seeing [name] in hospital followup on June 22, 2007, at 3 p.m.   FINAL DIAGNOSES:  1. Escherichia coli sepsis, felt to be related to a urinary source      with blood cultures positive for Escherichia coli with sensitivity      panel congruent with urine cultures.  He has responded clinically      to ciprofloxacin and transitioning to an oral dose.  2. Acute pyelonephritis.  3. Elevated cardiac markers with a known history of nonobstructive      coronary disease with an normal echocardiogram status post      inpatient consultation with Dr. Antoine Poche of [facility] who      has recommended an anticipated outpatient Myoview.  His ejection      fraction was 55%.  Again, he did have known nonobstructive coronary      disease.  4. Diarrhea during this admission following course of Zosyn.  This is      resolving with Questran.  It was determined this was negative for      Clostridium difficile on two occasions.  His diarrhea did respond      to Questran and he can continue with a low dose of Imodium on a      p.r.n. basis in the outpatient setting.  5. Fatty liver infiltration with fat sparing in the posterior left      lobe and nonemergent hepatic MRI which can be pursued in the      outpatient setting.  6. Thrombocytopenia present on arrival.  In review of previous notes      he has had a low-grade  thrombocytopenia since his last hospital      course in 2007 with a platelet count of 154.  His platelet count      during this admission revealed his platelets to remain in the 74      range.  Recommended he undergo outpatient followup of this after      treatment of his acute infection and undergo a repeat CBC.  If his      platelets continue to remain decreased, would suggest that he see a      hematologist/oncologist for further evaluation.  7. Elevated PSA in the context of Foley catheter status post inpatient      consultation with Dr. Earlene Plater of urology who recommended that he      undergo a repeat ultrasound in 3 months  to further evaluate left      hypoechogenicity in the left kidney and he did recommend that Mr.      Franklin follow up with him, Dr. Darvin Franklin, or Cammy Copa as      an outpatient.  He was provided with Dr. Gaynelle Arabian' contact      information.  8. History of hypertension.  9. History of transient ischemic attacks.  10.History of post traumatic stress disorder.  11.Cholestasis felt to be related to underlying and resolving sepsis      with recommendations for followup hepatic function panel upon      primary care physician followup within 1 week of discharge.   MEDICATIONS ON DISCHARGE:  1. He should resume his Effexor as he was on prior to arrival at 150      mg p.o. b.i.d.  2. Plavix 75 mg daily.  3. Simvastatin 80 mg daily.  4. Vicodin 5/500 p.o. q.6h. p.r.n.  5. Cipro 500 mg p.o. b.i.d. to conclude on June 25, 2007.  6. Questran 2 g daily x3 days.  7. Imodium 2 mg three times per day only as needed for diarrhea.  8. Lopressor 12.5 mg p.o. b.i.d.  9. Flomax 0.4 mg daily.  10.Simvastatin 80 mg daily.  11.Trazodone 300 mg q.h.s. p.r.n. as before.  12.P.r.n. nitroglycerin as he was on previous to arrival.   CONSULTATIONS THIS ADMISSION:  As noted above, Dr. Gaynelle Arabian of  urology; [facility], Dr. Angelina Sheriff.   LABORATORY DATA:   At the time of discharge his sodium was 143, potassium  3.7, BUN 8, creatinine 1.17, and glucose 100.  WBC of 3.7, hemoglobin  10.2, platelet count of 74.   Ultrasound performed during this hospitalization, findings:  1. Cholecystectomy without biliary duct obstruction.  2. Fatty infiltration of liver with probable fat sparing in posterior      left lobe.  If there is a history of liver disease or primary      malignancy, nonemergent hepatic MR should be considered when the      patient is clinically stable.  3. Lack of visualization of the pancreas.  4. Infrarenal abdominal aortic ectasia at 3.2 cm.  5. Right renal cyst.  6. Likely artifactual hypoechogenicity in left kidney; recommend      ultrasound in 3 months.   Head CT revealed chronic small-vessel changes, no other acute findings.  His chest CT revealed no active disease.   HISTORY OF PRESENTING ILLNESS:  For full details, please refer to the  history and physical as dictated by Dr. Della Goo.  However,  briefly, [name] is a 65 year old male who presented to the  emergency department with complaints of fevers and chills that started  within the prior 24 hours to arrival.  He reported having a fever of 105  at home.  He reported feeling sick and nauseous and having difficulty  urinating.  He did not have any vomiting or diarrhea.  He had some  headaches as well.  He reported some chills.  He had a high temperature  of 105.1.  Blood cultures were sent and he was empirically initiated on  Rocephin.   HOSPITAL COURSE:  [name] was admitted and was initially noted to  have what was felt to be acute pyelonephritis.  He was initiated on  Zosyn.  He was noted to have some abnormal hepatic function tests with a  total bili of 2.2 and a direct bilirubin of 1.1, alk phos 162,  AST of 31  and ALT of 22, and an albumin of 3.1.  He underwent hepatic ultrasound  which did not reveal any common bile duct dilation or evidence  of  obstruction.  He underwent 2-D echocardiogram as well during this  admission which revealed ejection fraction of 55%.  There was no  evidence of regional wall motion abnormalities.  Stool for C. difficile  was negative.   [name] hospital course was that of stability.  He did have some  problems with Foley catheter that was addressed by Dr. Darvin Franklin.  He  successfully weaned his Foley catheter, though had been having some  incontinence or inability to hold his urine.  Dr. Earlene Plater had recommended  followup with him following discharge.  In addition, cardiology noted  that he had some elevated cardiac enzymes with a troponin on September 8  being 0.16.  Cardiology requested that an outpatient Myoview be pursued.  In addition, his prostate specific antigen was noted to be 13.74 with  Dr. Earlene Plater recommending a followup PSA once he is over his acute sepsis  episode.  He had some diarrhea that improved with Questran and Imodium.  He underwent assessment for C. difficile which was negative.  His  platelets were low since admission with an initial platelet level of 82  on June 11, 2007.   FINAL RECOMMENDATIONS:  1. Mr. Rode is instructed to undergo urology followup with Dr.      Earlene Plater or his nurse practitioner, Ms. Chase Picket, and follow up with      cardiology for stress test of his heart.  2. He should have a followup imaging study of his liver.  In addition,      followup LFTs performed at his primary care physician clinic visit      on September 18.  Our radiologists do recommend an MRI of his liver      to further evaluate findings noted on the ultrasound and followup      CBC to assure that his platelets are normalizing.      Hettie Holstein, D.O.  Electronically Signed     ESS/MEDQ  D:  06/16/2007  T:  06/16/2007  Job:  [id number]   cc:   Lelon Perla, DO  [facility].  Rollene Rotunda, MD, [facility]  Ronald L. Earlene Plater, M.D.  Alessandra Bevels. Chase Picket, FNP-C  Titus Dubin.  Alwyn Ren, MD,FACP,FCCP

## 2011-02-16 NOTE — Consult Note (Signed)
[name], Franklin               ACCOUNT NO.:  [id number]   MEDICAL RECORD NO.:  [id number]          PATIENT TYPE:  INP   LOCATION:  2926                         FACILITY:  [facility]   PHYSICIAN:  Jesse Sans. Wall, MD, FACCDATE OF BIRTH:  03/20/1946   DATE OF CONSULTATION:  06/13/2007  DATE OF DISCHARGE:                                 CONSULTATION   Were asked by Dr. Isidor Holts of the [facility] medical team to  consult on [name], a 65 year old married white male who has  nonobstructive coronary disease and had a mild increase in troponin  enzymes in the setting of E. coli urosepsis.   He is 65 years of age and was admitted with fever, chills, rigors.  He  had a temperature up to 105.  He was having difficulty urinating to the  past 24 hours.  He also had nausea but no vomiting and did have  diaphoresis.   His blood cultures have grown out E. coli, and his urine cultures also  grew out E. coli.  He has been on intravenous antibiotics.   His troponin increased to 0.16, then to 0.11, then to 0.09.  His CPK-MB  was 1310 peak with an MB of 3.4.  His relative index was negative.   His EKG on acute presentation showed sinus rhythm with no acute changes.   Catheterization in 2005 showed 30% left main, 40-50% LAD, 30% dominant  circumflex, 70% obtuse marginal, normal right coronary artery.  EF  August 2007 by echocardiogram was 65%.   PAST MEDICAL HISTORY:   ALLERGIES:  No known drug allergies.   CURRENT MEDICATIONS:  1. Aspirin 81 mg a day.  2. Klonopin 0.25 b.i.d.  3. Plavix 75 mg a day.  4. Lovenox 40 mg subcutaneously daily.  5. Lopressor 12.5 mg p.o. b.i.d.  6. Protonix 40 mg a day.  7. Zosyn q. 6 h.  8. Senokot.  9. Zocor 80 mg nightly.  10.Trazodone 300 mg nightly.  11.Effexor 150 mg p.o. b.i.d.   He has borderline diabetes, hyperlipidemia, hypertension and obesity.  He has an abdominal aortic aneurysm diagnosed in 2005 at 3.2 x 2.8.  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  He had a history of  decreased heart rate in 2007 from a beta blocker.  He has  gastroesophageal reflux.   He has had previous cholecystectomy and right leg surgery.   SOCIAL HISTORY:  Lives in [place] with his wife.  Quit smoking 20  years ago, had a 30-pack-year smoking history.  He is disabled Hotel manager.   FAMILY HISTORY:  Positive for coronary disease.   REVIEW OF SYSTEMS:  Other than the presentation, he has had some  headaches.  He has also suffered from anxiety through the years.  He now  has diarrhea.  Other Review of Systems negative.   PHYSICAL EXAMINATION:  VITAL SIGNS:  Blood pressure is 114/99. His pulse  is 81 in sinus rhythm, saturation 100%, respirations 24 and labored.  GENERAL:  Skin is slightly clammy.  He is pale.  HEENT:  Bearded. Normocephalic, atraumatic  otherwise. PERLA.  Extraocular movements intact.  Sclerae clear.  Dentition unsatisfactory.  NECK:  Supple.  Carotid upstrokes were equal bilaterally bruits, no JVD.  Thyroid is not enlarged.  LUNGS:  Clear.  HEART:  Reveals a normal S1-S2 without murmur or gallop.  ABDOMEN:  Soft, good bowel sounds, slight distention.  I did not do any  serious palpation because of his diarrhea.  EXTREMITIES:  No sinus, clubbing, or edema.  Pulses were brisk.  NEUROLOGIC:  Exam is grossly intact.  SKIN:  Unremarkable.   His blood work other than his cardiac enzymes above are remarkable for  his blood cultures being positive for E. coli, urine culture E. coli and  a PSA of 13.74.  He is anemic with a hemoglobin 9.9.  His white count is  8.5.  Potassium 3.5, creatinine 1.39.   Head CT was negative for an acute event.   ASSESSMENT:  1. Escherichia coli urosepsis.  2. Nonobstructive coronary disease with mild increase in his      troponins.  I doubt acute coronary syndrome with normal CPK-MB      component and a normal EKG on presentation.  It was probably      secondary to sepsis or  myocardial strain.  3. Increased prostate-specific antigen, question obstructive uropathy.      He is due a renal ultrasound.   RECOMMENDATIONS:  1. Continue current cardiac medications.  2. Telemetry.  3. Preop Myoview if surgery is in the foreseeable future.  4. Check abdominal aortic aneurysm with ultrasound.      Thomas C. Daleen Squibb, MD, [facility]  Electronically Signed     TCW/MEDQ  D:  06/13/2007  T:  06/13/2007  Job:  [id number]   cc:   Luis Abed, MD, [facility]  Isidor Holts, M.D.

## 2011-02-16 NOTE — Discharge Summary (Signed)
[name], [name]               ACCOUNT NO.:  [id number]   MEDICAL RECORD NO.:  [id number]          PATIENT TYPE:  INP   LOCATION:  3007                         FACILITY:  [facility]   PHYSICIAN:  Raenette Rover. Felicity Coyer, MDDATE OF BIRTH:  1946-08-26   DATE OF ADMISSION:  10/14/2008  DATE OF DISCHARGE:  10/17/2008                               DISCHARGE SUMMARY   PRIMARY CARE PHYSICIAN:  Lelon Perla, DO, also seen at  [facility].   GASTROENTEROLOGIST:  Iva Boop, MD, [facility]   CARDIOLOGIST:  Rollene Rotunda, MD, [facility]   DISCHARGE DIAGNOSES:  1. Acute blood loss anemia secondary to gastrointestinal bleed with      duodenal ulcer found on esophagogastroduodenoscopy.  2. Acute cerebrovascular accident.  3. Gait instability likely secondary to acute cerebrovascular      accident.   HISTORY OF PRESENT ILLNESS:  [name] is a 65 year old white male  with past medical history of anemia, hypertension, hyperlipidemia,  coronary artery disease, and type 2 diabetes who presented to [facility]  Emergency Room on day of admission with reports of weakness and frequent  falls.  Wife also reported to the emergency room physician at the time  of admission that the patient also experiencing some confusion that  began on day prior to ER evaluation; however, family was unavailable at  the time of admission evaluation.  Difficulty obtaining initial H and P  as the patient is extremely a poor historian.  The patient did report  feeling weak and dizzy on the morning of admission suffering fall with  subsequent skin tears to left arm.  The patient reports dark stools for  a while with nausea and vomiting on morning of admission.  The patient  does take chronic pain meds prescribed by Dr. Channing Mutters for chronic leg pain;  however, he is unsure if he takes any NSAIDs.  Upon evaluation to ER,  the patient found to have a hemoglobin of 8.7.  In addition, CT of the  head obtained due to altered mental  status was suspicious for acute and  subacute infarctions.  The patient was admitted at that time for further  evaluation and treatment.   PAST MEDICAL HISTORY:  1. Microcytic anemia with admission in June 2009, with endoscopy      revealing nonerosive gastritis and colonoscopy revealing internal      hemorrhoids.  2. Hypertension.  3. Hyperlipidemia.  4. Nonobstructive CAD.  5. History of CVA and TIAs.  6. GERD.  7. Vitamin B12 deficiency.  8. Obesity.  9. Posttraumatic stress disorder, [place] vet.  10.Chronic leg pain.  11.History of melanoma.  12.BPH.  13.Status post cholecystectomy.  14.Type 2 diabetes.   PROCEDURES DURING THIS HOSPITALIZATION:  1. Esophagogastroduodenoscopy done October 15, 2008, revealing      superficial ulcer at outlet from duodenal bowl with duodenitis.      Biopsies were negative for Helicobacter pylori.  2. A 2D echocardiogram revealing left ventricular ejection fraction of      55-60%.  No wall motion abnormalities.  No cardiac source of emboli  detected.  3. MRI/MRA of the brain performed on October 14, 2008, revealing      multiple areas of acute infarction in the posterior circulation      including the cerebellum bilaterally right occipital lobe and left      pons.  MRA revealing advanced intracranial arteriosclerotic      disease, more severe in posterior circulation.  4. Carotid Doppler studies revealing no left internal carotid artery      stenosis, but 60-80% internal carotid artery stenosis on the right      side.   COURSE OF HOSPITALIZATION:  1. GI bleed in the setting of duodenitis with suspected duodenal      ulcer.  Again, the patient is status post EGD on October 15, 2008.      The patient is to continue daily PPI at the time of discharge as      instructed by gastroenterologist.  The patient can restart Plavix      in 1 week.  The patient and wife have been instructed no NSAIDs      indefinitely.  The patient has received  total of 2 units of packed      red blood cells during this admission.  Hemoglobin remained stable      at the time of discharge at 9.9.  No recurrent bleeding since time      of admit.  2. Acute infarcts per MRI.  Wife reports patient noncompliant with      Plavix.  The patient's frequent falls likely secondary to posterior      CVAs resulting in gait and balance.  The patient will be restarted      on Plavix with importance of medication compliance emphasized to      both patient and wife who verified understanding.  The patient is      certainly not an anticoagulation candidate given frequent falls and      history of noncompliance.  The patient will also need close      monitoring of right ICA stenosis to be arranged by primary care      physician.  3. Med noncompliance.  As mentioned above, importance of med      compliance urged numerous times throughout hospitalization to both      patient and wife.  Wife and the patient refused skilled nursing      facility placement.  However, home health physical and occupational      therapy as well as home health aide will be arranged in order to      assist with medications and to assess safety at home.   MEDICATIONS AT THE TIME OF DISCHARGE:  1. Omeprazole 20 mg p.o. daily.  2. Plavix 75 mg daily.  The patient to restart taking on Wednesday,      October 23, 2008.  3. Effexor XR 150 mg p.o. b.i.d.  4. Clonazepam 0.5 mg p.o. b.i.d.  5. Zocor 80 mg p.o. nightly.  6. Trazodone 25 mg p.o. nightly.  7. Flomax 0.4 mg p.o. daily.  8. Metoprolol 25 mg p.o. b.i.d.  9. Iron 325 mg p.o. daily.  10.Finasteride 5 mg p.o. daily.  11.Metformin 850 mg p.o. b.i.d.  12.The patient instructed to avoid all NSAIDs.   LABORATORY DATA:  Pertinent laboratory work at the time of dictation:  Hemoglobin 9.9, hematocrit 30.2.  Creatinine 0.94.  Total cholesterol  160, triglycerides 184, HDL 23, LDL 100.  Hemoglobin A1c 6.4.  TSH  1.477.   DISPOSITION:  The  patient felt medically stable for discharge home at  this time.  Again, home health physical and occupational therapy as well  as home health aide will be sent to home to further assess the patient's  safety and aid in medication.  The patient will also be sent home with  rolling walker as recommended by PT to prevent falls.  The  patient is instructed to follow up with his primary care physician Dr.  Loreen Freud on Thursday, October 24, 2008, at 2:15 p.m.   Greater than 30 minutes spent on discharge planning.      Cordelia Pen, NP      Raenette Rover. Felicity Coyer, MD  Electronically Signed    LE/MEDQ  D:  10/17/2008  T:  10/18/2008  Job:  [id number]   cc:   Lelon Perla, DO  Iva Boop, MD,FACG  Rollene Rotunda, MD, [facility]
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
## 2011-02-17 ENCOUNTER — Ambulatory Visit (HOSPITAL_COMMUNITY): Payer: Federal, State, Local not specified - PPO

## 2011-02-19 ENCOUNTER — Ambulatory Visit (HOSPITAL_COMMUNITY): Payer: Federal, State, Local not specified - PPO

## 2011-02-19 NOTE — Discharge Summary (Signed)
[name], [name]               ACCOUNT NO.:  [id number]   MEDICAL RECORD NO.:  [id number]          PATIENT TYPE:  INP   LOCATION:  3017                         FACILITY:  [facility]   PHYSICIAN:  Broadus John T. Pickard II, MDDATE OF BIRTH:  21-Nov-1945   DATE OF ADMISSION:  05/24/2006  DATE OF DISCHARGE:  05/26/2006                                 DISCHARGE SUMMARY   PRIMARY CARE PHYSICIAN:  Dr. Nilda Simmer at [facility] in [place].   CONSULTING PHYSICIAN:  Dr. Eden Emms with [facility].   ADMISSION DIAGNOSES:  1. Episodic ataxia.  2. Presyncope.   DISCHARGE DIAGNOSES:  1. Presyncopal episodes secondary to orthostatic hypotension, secondary to      medication side effects.  2. Polypharmacy, including metoprolol, prazosin, trazodone, Risperdal in a      gentleman with orthostatic hypotension.  3. Borderline diabetes mellitus type 2.  4. History of coronary artery disease with a cath in January of 2005      showing 30% stenosis in the left main artery, 30% stenosis in the left      circumflex, 30-40% stenosis in the left anterior descending, and 60% to      70% stenosis in the proximal segment of the marginal first branch.  The      patient did have a followup nonischemic Myoview stress test in January      of 2005 showing that this was nonischemic coronary artery disease.  5. Dyslipidemia.  6. Abdominal aortic aneurysm.  7. Gastroesophageal reflux disease.  8. Anemia.  9. Sinus bradycardia.   PROCEDURES:  1. An MRI/MRA of the brain on August the 21st shows atrophy with chronic      microvascular ischemic change.  No acute stroke.  No abnormal      intracranial enhancement.  The MRA shows widespread intracranial      arthrosclerotic change, with most prominent area of stenosis noted in      the midbasilar section.  It was, however, felt that this is      insignificant and not causing the patient symptoms.  2. The patient had an 2-D echo of the heart on January 22.   This showed      left ventricular ejection fraction of 65% with no left ventricular wall      motion abnormalities, good left ventricle and right ventricle function,      and no cardiac source of embolus, and no valvular disease.   LABORATORY DATA:  Admission CMP shows a sodium of 140, potassium of 4.3,  chloride of 109, bicarb of 26, glucose of 97, BUN of 13, creatinine of 1.2.  T-bili of 0.5, alk phos of 40, AST of 14, ALT of 14; all normal.  CBC on  admission showed a white count of 7, hemoglobin of 12.3, crit of 36.4 and  platelet count of 154, which was normal.  The patient had cardiac enzymes in  the hospital that were negative x3.  He had a urinalysis that showed normal  spec grav and was otherwise normal, with negative nitrite, negative  leukocyte esterase.  No ketones, protein  or blood.  Urine drug screen was  positive for opiates.  Lipid profile in the hospital showed triglycerides of  151, HDL of 30, LDL of 52.  Hemoglobin A1c in the hospital was 6.  A BMP on  the day of discharge was normal, and had a creatinine of 1.2.  A CBC on the  day of discharged showed stable mild anemia, hemoglobin of 11.7, no white  count, and a normal platelet count.   HISTORY AND PHYSICAL:  Please see a copy of the dictated H&P in the chart.  In short, the patient is a 65 year old white male with history of coronary  artery disease, who presented with episodic intermittent ataxia and  presyncopal events.  These were typically related to changes in positions.  At [facility], he was seen by Dr. Katrinka Blazing, who found the patient to  be orthostatic hypotensive, with blood pressure dropping while standing to  70/40.  In the mean time, his heart rate stayed in the 40s and did not  increase concurrently.  It was thought the patient was having presyncopal  ataxic episodes secondary to either to a cardiac arrhythmia or orthostatic  hypotension with autonomic dysreflexia or possible conduction  abnormalities  in the heart.   HOSPITAL COURSE:  1. Episodic ataxia and presyncope: MRI/MRA of the brain was negative for      any posterior circulation defects that could potentially be causing his      causing his problem.  His echo was negative for any valvular disease      and cardiac emboli that could be causing his problem.  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  The patient did not have a medication list at the time      he presented.  Cardiology was therefore consulted as well, who once the      medication list was obtained and with the studies we had already      acquired, felt that it was likely secondary to medication side effects.   Upon review of his medication list on day 2 of the hospitalization, the  patient had recently increased his trazodone from 100 to 400 two weeks ago  for insomnia, and beginning 10 days prior to admission, he had started  having these attacks, so it temporarily was related to the trazodone.  He is  also on Risperdal, which can cause orthostatic hypertension.  He is also  taking metoprolol even though he is bradycardic in the 40s, and on prazosin.  He does not know what he takes, and he has multiple medications in the  medicine cabinet at different times from different doctors.  It was  therefore felt that he was likely taking medication inappropriately, and  that the side effects were added to cause the patient problems.  At  discharge, the patient's heart rate was in the 60s after we had held his  metoprolol, and he, on the day of discharge, was no longer orthostatic.  Orthostatic vitals were negative, and his blood pressure was actually in the  110s systolically lying, sitting, and standing with a heart rate in the 60s.  The patient was monitored on telemetry for 48 hours with no cardiac  events other than a primary AV block and bradycardia.  1. Borderline diabetes mellitus.  His hemoglobin A1c was 6, indicating      fair control.  He had some moderately elevated blood sugars in the      hospital in the 150s to 160s range.  We will defer that to his primary      care physician to address as an outpatient.  2. Dyslipidemia.  The patient came in with bottles of 20 of Zocor and 80      of Zocor.  He got a fasted lipid panel for risk stratification, and      based on the fasted lipid panel, would continued to take the 80 mg of      Zocor.  3. Insomnia.  Advised the patient to stop the Risperdal, and to decrease      the trazodone or not take it all.  I stated that if he was going to      take trazodone, he should take no more than 50-100 mg at night for      insomnia, but advised actually that the patient should not take it all.      Advised him to followup with a primary care physician, whether it be at      the [facility] or Dr. Katrinka Blazing at [facility], and they can determine what      would be the best agent for insomnia in this gentleman.  4. Posttraumatic stress disorder.  Continue the patient on his Celexa 40      mg p.o. daily.  5. Gastroesophageal reflux disease.  Continue the patient on omeprazole 20      mg p.o. daily.   DISCHARGE MEDICATIONS:  1. Celexa 40 mg p.o. daily.  2. Plavix 75 mg p.o. daily.  This is presumed to be secondary to his      coronary artery disease.  Even though he is not aspirin, we will leave      this to his primary care physician to determine whether or not they      want to continue that long term.  3. Omeprazole 20 mg p.o. daily.  4. Zocor 80 mg p.o. daily.  5. The patient is instructed to take sorbitol in fleets as needed for      constipation.  He is instructed to stop taking metoprolol, and to hold      taking the prazosin until seen by his regular doctor.  He states that      he is taking the prazosin for nightmares.  6. Trazodone  100 mg p.o. q.h.s. p.r.n.  I strongly encouraged the patient      not to take the medication at all until seen by his regular doctor.   DISCHARGE FOLLOWUP:  In[facility]ed [facility]  Nilda Simmer at [facility], and arrange for a followup appointment  in 1-2 weeks so that they can review his medication list and make changes as  they deem appropriate.  At followup, would appreciate if Dr. Katrinka Blazing or his  regular PCP, whoever he deems that to be at the [facility], would determine whether  or not he needs to continue being on Plavix versus just a regular baby  aspirin, also determine whether or not they want to continue even 100 mg of  trazodone as an outpatient for insomnia versus possibly some of the newer  agents, also determine whether or not he needs Risperdal for his  posttraumatic stress and teeth grinding, which is what he states it is for,  given the fact he has severe orthostatic hypotension, and to followup his borderline diabetes and fasted lipid panel, which seems to be adequately  controlled at this time.  The patient is also scheduled to have an  outpatient followup with his cardiologist; however, they feel that at this  time, there are no electrophysiologic studies warranted given the fact he is  taking these medications and his echo was normal, so it did not appear to be  an ishemic event with normal cardiac enzymes.      Broadus John T. Pamalee Leyden, MD     WTP/MEDQ  D:  05/26/2006  T:  05/26/2006  Job:  [id number]   cc:   Nilda Simmer, M.D.

## 2011-02-19 NOTE — H&P (Signed)
[name], [name]               ACCOUNT NO.:  [id number]   MEDICAL RECORD NO.:  [id number]          PATIENT TYPE:  INP   LOCATION:  3303                         FACILITY:  [facility]   PHYSICIAN:  Broadus John T. Pickard II, MDDATE OF BIRTH:  [birth year]   DATE OF ADMISSION:  05/24/2006  DATE OF DISCHARGE:                                HISTORY & PHYSICAL   CHIEF COMPLAINT:  Ataxia and confusion.   HISTORY OF PRESENT ILLNESS:  The patient is a 65 year old white male with a  history of coronary artery disease which shows to involve multiple vessels  on a catheterization in 2005 and he has also had a nonischemic Cardiolite  stress test.  Over the last 10 days, he is having multiple episodes of  sudden ataxia, loss of balance, confusion, presyncope and falls.  They are  not related to positional changes and they come to him out of the blue and  hit me like a switch.  Suddenly, the patient reports lightheadedness that  everything goes dark.  He loses his balance and falls.  Within minutes he  recovers and has no residual neurologic impairment.  He does have a known  history of profound bradycardia.  However, he reports racing heart and  palpitations with these episodes.   PAST MEDICAL HISTORY:  1. Sinus bradycardia.  2. Anemia.  3. Borderline diabetes mellitus.  4. Dyslipidemia.  5. Abdominal aortic aneurysm.  6. Gastroesophageal reflux disease.  7. History of coronary artery disease with a catheterization in January      2005 showing 30% stenosis in the left main artery and 30% stenosis in      the left circumflex and 30-40% stenosis of the left anterior      descending.  For the circumflex artery, the first marginal branch has      moderate disease of 60-70% stenosis in the proximal segment.  8. The patient did have a nonischemic Myoview stress test in January of      2005.   PRIMARY CARE PHYSICIANS:  The patient does get seen at the [facility] as well  as [facility] [place].  Dr. Myrtis Ser is his  cardiologist with [facility].   PAST SURGICAL HISTORY:  1. Cholecystectomy.  2. Right leg surgery.  3. Facial and palate reconstructive surgery.   ALLERGIES:  No known drug allergies.   MEDICATIONS:  The patient's family member brought a list of medications in  but the patient is unsure of how many he is actually taking at the time.  The list does include:  1. Zocor 20 mg as well as Zocor 80 mg.  2. Nitroglycerin.  3. Omeprazole 20 mg.  4. Sorbitol 70%.  5. Celexa 40 mg.  6. Prazosin 2 mg.  7. Plavix 75 mg.  8. Risperdal 2 mg.  9. Trazodone 100 mg.   SOCIAL HISTORY:  The patient is on disability.  He was previously in the  [employer] so he gets much of his care at the [facility] in [place].  He quit  smoking and drinking alcohol approximately 20 years ago.  He denies  any  other illicit drug use.   FAMILY HISTORY:  The patient's father died of heart problems in his 97s.  His mother died of heart problems in her 36s.  The patient has one sister  with many medical problems.  However, he is unable to specify what these  problems are.   PHYSICAL EXAMINATION:  VITAL SIGNS:  Temperature 98, pulse 45, respirations  18, blood pressure 86/56.  Orthostatics that were taken at the [facility] supine blood pressure is 115/62 with a pulse of 44, sitting  blood pressure is 90/55 with a pulse of 44 and standing blood pressure is  71/56 with a pulse of 47.  GENERAL:  On exam, the patient is an unkempt bearded male lying on a  stretcher in no apparent distress.  He is alert and oriented x3.  HEENT:  The patient is atraumatic, normocephalic.  His pupils are equal,  round and reactive to light.  Extraocular muscles are intact.  Tympanic  membranes and external auditory canals are clear.  There is no  lymphadenopathy.  There is no thyromegaly.  No JVD.  No carotid bruits.  He  has moist mucous membranes.  He is missing many teeth.  CARDIOVASCULAR:  The patient has distant  heart sounds with no murmurs, rubs  or gallops.  He is bradycardic with a pulse in the 40s.  PULMONARY:  His lungs are clear to auscultation bilaterally with no wheezes,  rales or rhonchi.  ABDOMEN:  Soft, nontender, nondistended with positive normoactive bowel  sounds.  EXTREMITIES:  No cyanosis, clubbing or edema.  He does have a mild knee  scrape on his left knee suffered during one of his recent falls.  NEUROLOGIC:  Cranial nerves II-XII are grossly intact.  Motor strength is  5/5 in all extremities bilaterally.  He has normal reflexes.   LABORATORY DATA:  Laboratory data reviewed from results at [facility]: EKG showed a rate of 44 beats per minute, sinus bradycardia, normal  axis, borderline primary AV block with normal QRS, QTC intervals.  There are  no acute ST changes.  There is a question of an RSR prime complex in V1 and  V2 on some of the EKGs.   ASSESSMENT AND PLAN:  This is a 65 year old male with intermittent episodes  of presyncope, ataxia and falling.   1. Presyncope:  We will admit to telemetry bed.  We will check a 2-D echo      of the heart to rule out valvular disease.  We will also rule out      myocardial infarction with three sets of cardiac enzymes.  History is      very concerning for a possible cardiac arrhythmia.  Also given      bradycardia and orthostatic vitals, I am concerned about on autonomic      dysfunction, and we will check a BMP, CBC, urinalysis.  Based on BMP,      he may need to be worked up for hypoaldosteronism.  We will repeat      orthostatics and if positive we will bolus with IV fluids.      Differential diagnoses also includes possible transient ischemic      attacks in the basilar system.  Therefore, we will check an MRI/MRI to      evaluate for posterior fossa.  If workup is negative, can consider      Florinef for autonomic dysfunction.  2. History of heart disease and diabetes  mellitus.  We will get a     hemoglobin A1c  as well as a fasting lipid panel.  3. Fluids, electrolytes and nutrition.  We will bolus now with one liter      of normal saline and then start the patient on some maintenance IV      fluids.  4. Pain control.  We will control the patient's pain with Tylenol 650 mg      p.r.n.  5. Prophylaxis for gastrointestinal.  We will start the patient on      Protonix and for DVT will start the patient on Lovenox.      Broadus John T. Pamalee Leyden, MD     WTP/MEDQ  D:  05/25/2006  T:  05/25/2006  Job:  [id number]

## 2011-02-19 NOTE — Discharge Summary (Signed)
NAME:  [name], [name]                         ACCOUNT NO.:  [id number]   MEDICAL RECORD NO.:  [id number]                   PATIENT TYPE:  INP   LOCATION:  3003                                 FACILITY:  [facility]   PHYSICIAN:  Genene Churn. Love, M.D.                 DATE OF BIRTH:  03-23-1946   DATE OF ADMISSION:  09/27/2003  DATE OF DISCHARGE:  10/02/2003                                 DISCHARGE SUMMARY   ADDENDUM:  [name] had an episode of headache lasting for approximately  2-3 days in the hospital related to his Aggrenox.  He developed headache  with nausea and vomiting the day of discharge.  A CT scan of the brain was  obtained without contrast, and showing not evidence of subarachnoid  hemorrhage.  The patient received IV Zofran for the nausea, subsequently did  well.  It was decided to discharge the patient, at his request to go home on  Plavix, instead of Aggrenox for antiplatelet therapy.                                                Genene Churn. Sandria Manly, M.D.    JML/MEDQ  D:  10/02/2003  T:  10/03/2003  Job:  [id number]

## 2011-02-19 NOTE — Cardiovascular Report (Signed)
NAME:  [name], [name]                         ACCOUNT NO.:  [id number]   MEDICAL RECORD NO.:  [id number]                   PATIENT TYPE:  INP   LOCATION:  6529                                 FACILITY:  [facility]   PHYSICIAN:  Veneda Melter, M.D.                   DATE OF BIRTH:  November 28, 1945   DATE OF PROCEDURE:  10/24/2003  DATE OF DISCHARGE:                              CARDIAC CATHETERIZATION   PROCEDURES PERFORMED:  1. Left heart catheterization.  2. Left ventriculogram.  3. Selective coronary angiography.  4. Abdominal aortogram.   DIAGNOSES:  1. Moderate two-vessel coronary artery disease.  2. Normal left ventricular systolic function.   HISTORY:  Mr. Lupercio is a 65 year old white male with multiple medical  problems who presents with substernal chest discomfort.  The patient was  admitted to the hospital and stabilized medically and ruled out for acute  myocardial infarction.  He presents now for further assessment.   TECHNIQUE:  Informed consent was obtained.  The patient brought to the  catheterization lab.  A 6 French sheath was placed in the right femoral  artery using the modified Seldinger technique.  A 6 [place] JL-4 and JR-4  catheter was then used to engage the left and right coronary arteries and  selective angiography performed in various projections using manual  injection contrast.  A 6 French pigtail catheter was advanced in the left  ventricle and a left ventriculogram performed using power injection  contrast.  Pigtail catheter was brought back in the descending aorta and  abdominal aortogram performed using power injections of contrast.  After  termination of this case, the catheters were removed and manual pressure  applied until adequate hemostasis was achieved.  The patient tolerated the  procedure well and was transferred to the floor in stable condition.   FINDINGS:   LEFT HEART CATHETERIZATION:  1. Left main trunk:  Medium caliber vessel heavily  calcified with mild     concentric narrowing of 30% in the mid section.  2. LAD:  This is a medium-caliber vessel that provides several small     diagonal branches in the mid section.  The LAD has ostial narrowing of 30-     40%.  There is then mild aneurysmal dilatation followed by diffuse     disease of 50% in the mid section.  3. Left circumflex artery is dominant.  It is a medium caliber vessel that     provides a first marginal branch in the mid section and a posterior     descending artery distally.  The AV circumflex has mild diffuse disease     of 30%.  The first marginal branch has moderate disease of 60-70% in the     proximal segment.  4. Right coronary artery is nondominant.  It is a small caliber vessel that     provides an RV marginal branch.  There are mild  irregularities in the     right coronary system.   LEFT VENTRICULOGRAPHY:  1. Normal end-systolic and end-diastolic dimensions.  2. Overall left ventricular function is well preserved.  3. Ejection fraction greater than 55%.  4. No mitral regurgitation.  5. LV pressure is 130/8.  6. Aortic pressure is 130/60.  7. LVEDP equals 17.   ABDOMINAL AORTOGRAPHY:  1. Abdominal  aorta is of normal caliber.  There is trivial aneurysmal     dilatation.  There is moderate atherosclerotic disease without critical     stenosis.  2. Renal arteries are single and patent bilaterally.  3. The iliac arteries have  mild disease bilaterally.   ASSESSMENT/PLAN:  Mr. Desroches is a 65 year old gentleman with moderate  coronary atherosclerotic disease and overall well preserved LV function.  Further assessment with stress imaging study will be beneficial to localize  area of ischemia. Otherwise, aggressive medical therapy may be pursued.                                               Veneda Melter, M.D.    Melton Alar  D:  10/24/2003  T:  10/24/2003  Job:  [id number]   cc:   [facility]

## 2011-02-19 NOTE — Consult Note (Signed)
Benjamin, Franklin               ACCOUNT NO.:  [id number]   MEDICAL RECORD NO.:  [id number]          PATIENT TYPE:  INP   LOCATION:  3303                         FACILITY:  [facility]   PHYSICIAN:  Noralyn Pick. Eden Emms, MD,FACCDATE OF BIRTH:  03-08-1946   DATE OF CONSULTATION:  DATE OF DISCHARGE:                                   CONSULTATION   [name] is a 65 year old patient of Dr. Myrtis Ser.  He was admitted on May 24, 2006, by the teaching service for ataxia and confusion.  The patient is  on disability.   He is usually seen at the [facility] and has been seen by Dr. Myrtis Ser.   There is a history of moderate coronary disease by cath in 2005.  The  patient has had a nonischemic Myoview in 2005 as well.  He has had multiple  episodes of sudden ataxia with loss of balance and confusion as well as  presyncope.   He has not hurt himself in any way.  These symptoms come out of the blue  like a switch.   The patient does not notice palpitations or rapid tachy arrhythmias during  this time.   He has no residual neurological problems but does have a significant  headache now.   The patient has a history of bradycardia on no beta-blockers.  There is also  a history of anemia, borderline diabetes, hyperlipidemia and GERD.   The patient has had a previous cholecystectomy and right leg surgery.   MEDICATIONS:  1. Risperdal 2 mg a day.  2. Trazodone 100 mg a day.  3. Prazosin 2 mg a day.  4. Sorbitol.  5. Nitroglycerin p.r.n.  6. Zocor.   Patient indicated that he had started taking 400 mg of trazodone on his own  about two weeks ago.  This was after having not taken the drug on a regular  basis.  He is also on Celexa 40 mg a day.   ALLERGIES:  NO KNOWN DRUG ALLERGIES.   He is on disability.  He has previously been in the [employer].  He gets a lot  of his care at the [facility].  He quit smoking and alcohol about 20 years  ago.  He denies illicit drug use.   FAMILY HISTORY:   Remarkable for heart problems on his father's side in his  52s.   PHYSICAL EXAMINATION:  VITAL SIGNS:  Blood pressure is currently 100/55.  He  is not postural and the heart rate is 68.  NECK:  Carotids normal.  LUNGS:  Clear.  CARDIOVASCULAR:  There is an S1 and S2 with normal heart sounds.  ABDOMEN:  Benign.  EXTREMITIES:  Lower extremities with intact pulses, no edema.  NEUROLOGIC:  Nonfocal.   His EKG shows sinus bradycardia and is normal.  There is no heart block or  AV block.   His enzymes are negative.  His echo shows a normal EF 65%, no wall motion  abnormalities.   IMPRESSION:  The patient's symptoms do not sound cardiac in etiology,  although his heart rate is low, there has been no  evidence of high grade  heart block or arrhythmia.  He has normal left ventricular function and no  evidence of critical coronary disease with negative enzymes and no acute EKG  changes.   The patient will get a CT scan and MRI of his brain per his primary service  to make sure there is no cerebellar abnormalities.   I am also somewhat concerned by his medications including Risperdal, Celexa  and trazodone.  Clearly the increased use of the trazodone up to 400 mg a  day two weeks ago can be contributing to his postural hypotension.  I am  also concerned that he had fairly significant bloody stool recently and this  may be related to his postural hypotension.  Serial hemoglobin and  hematocrits will be followed.  He may need a colonoscopy prior to discharge.   From a cardiac perspective, I do not think he needs any further work-up.  He  will be monitored in house while he is here but I doubt a significant  arrhythmia will be found.  Can follow up with Dr. Myrtis Ser and have an  outpatient Myoview to rule out any progression of his coronary disease.   However, I think while in the hospital, further adjustments of his  psychotropic medications, neurological consultation and GI consultation for   his bloody stool would be more in order.  Cardiac evaluation can be done as  an outpatient and there is no indication for pacemaker therapy.           ______________________________  Noralyn Pick. Eden Emms, MD,FACC     PCN/MEDQ  D:  05/25/2006  T:  05/25/2006  Job:  [id number]

## 2011-02-19 NOTE — Discharge Summary (Signed)
NAME:  DESTRY, DAUBER                         ACCOUNT NO.:  [id number]   MEDICAL RECORD NO.:  [id number]                   PATIENT TYPE:  INP   LOCATION:  3003                                 FACILITY:  [facility]   PHYSICIAN:  Genene Churn. Love, M.D.                 DATE OF BIRTH:  1946/03/25   DATE OF ADMISSION:  09/27/2003  DATE OF DISCHARGE:  10/02/2003                                 DISCHARGE SUMMARY   DISCHARGE DIAGNOSES:  1. 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  2. Headache.  3. Hypertension.  4. Hypercholesterolemia.  5. Type 2 diabetes.  6. 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  7. Obesity.  8. Post-traumatic stress disorder with disability through the [facility] hospital.  9. Gastroesophageal reflux disease.  10.      Status post gallbladder surgery.  11.      Trauma involving the right leg with reconstructive surgery of the     nerves by Dr. Trey Sailors.   DISCHARGE MEDICATIONS:  1. Trazodone 100 mg q.h.s.  2. Prevacid 30 mg daily.  3. Prazosin 1 mg daily.  4. Celexa 40 mg daily.  5. Atenolol 25 mg daily.  6. Zocor 20 mg daily.  7. Indomethacin 25 mg daily.  8. Plavix 75 mg daily.   STUDIES PERFORMED:  1. CT of the brain on admission shows old bilateral small vessel ischemic     changes.  2. MRI of the brain shows single 5 mm focal area of hyperintensity involving     the body of the left caudate nucleus extending into the left centrum     semiovale.  Multiple foci of hyperintensity with a few interspersed areas     of hypointensity involving the putamen and globus pallidi.  Caudate     nucleus bilaterally with extension to the centrum semiovale.  These most     likely represent areas of ischemic gliosis related to areas of previous     ischemia with the presence of at least three lacunar infarcts noted.  A     similar appearance is associated with hypervascularity associated with     _________ phenomenon.  3. MRA of the brain, focal signal  and dropoff of the right P1 segments     suspicious of hemodynamically significant stenosis.  Focal prominence at     the origin of the right anterior inferior cerebellar artery.  This may     represent an infundibulum versus vessel tortuosity with less likely     possibility of an intracranial aneurysm at this site.  Focal     hyperintensity at the region of the anterior communicating artery, most     likely representing a vessel loop.  No major occlusions or detections are     identified.  4. MRA of the neck shows signal dropoff at the origin of the right vertebral     artery.  This may represent bona fide severe stenosis or artifact related     to technique.  Probable mild-to-moderate stenosis origin at the left     vertebral artery.  5. EKG shows sinus bradycardia.  6. A 2D echocardiogram shows an ejection fraction of 55-65%.  Inadequate to     evaluate left ventricular regional wall motion.  No likely discrete     intracardiac source of emboli; however, the study is poorly sensitive for     such a source.  7. Carotid Doppler normal.   LABORATORY STUDIES:  Sodium 141, potassium 4.6, chloride 107, CO2 28,  glucose 144, BUN 15, creatinine 1.1, calcium 9.3.  Potassium was 3.1 on  admission.  Hemoglobin A1C 6.1, homocysteine 13.38, cholesterol 149,  triglycerides 218.  HDL 24, LDL 81.  UA was negative.  Cardiac enzymes  negative.  Hemoglobin 11.1, hematocrit 37.4, white blood cells 5.5, and  platelets 167; differential within normal limits.   HISTORY OF PRESENT ILLNESS:  [name] is a 65 year old left-handed  white male with a history of hypertension, diabetes, and post-traumatic  stress disorder who is followed at the [place].  He presented to [facility] after noting significant gait instability upon awakening at 2 a.m.  today.  He claims when he got up to walk he would stagger to the left.  The  patient complained of bifrontal headache and also had some nausea but no   vomiting.  He did report new left-sided numbness.  He is still somewhat  better in the emergency room.  He denies no history of stroke.  He was moved  to the hospital for further stroke evaluation.  He is not a T-PA or  _________ candidate secondary to time.   HOSPITAL COURSE:  MRI did reveal an acute infarction that was deemed  secondary to small vessel disease.  The patient was tried on Aggrenox for  secondary stroke prevention but had side effects of headache and nausea.  Aggrenox was discontinued and converted to Plavix for discharge.  Other risk  factors identified included dyslipidemia for which the patient was on Zocor  prior to admission.  Triglycerides were also elevated and recommendations  were for Tricor to be added at discharge.  Patient does have a history of  borderline diabetes.  A hemoglobin A1C was within pretty good range.  Will  need to keep diabetes under good control at home as well as control  hypertension.   CONDITION ON DISCHARGE:  The patient is alert and oriented x3.  Speech is  clear.  No aphasia.  Calculation intact.  Gait steady.  Face is symmetric  with full visual fields and extraocular movements intact.  He has no arm  drift.  He does have an outstretched hand and an arm tremor.  His  finger/nose/finger is good.  PT evaluation initially recommended outpatient  PT for balance training, but this improved during hospitalization.  He will  not need it at discharge.   DISCHARGE PLAN:  1. Discharge home with wife.  2. Plavix secondary to stroke prevention.  Could not tolerate Aggrenox     secondary to headache and nausea.  3. Tricor added to lower triglycerides.  4. Follow up with Dr. Delia Heady in two months.  Call for an appointment.  5. Follow up with primary physician within the next month.      Annie Main, N.P.  Genene Churn. Sandria Manly, M.D.    SB/MEDQ  D:  10/02/2003  T:  10/02/2003  Job:  [id number]   cc:   Dr. [place] Reed  [facility]   Titus Dubin. Alwyn Ren, M.D. [facility]   Pramod P. Pearlean Brownie, MD  Fax: [phone]

## 2011-02-19 NOTE — H&P (Signed)
NAME:  [name], [name] NO.:  [id number]   MEDICAL RECORD NO.:  [id number]                   PATIENT TYPE:  EMS   LOCATION:  MAJO                                 FACILITY:  [facility]   PHYSICIAN:  Carleene Cooper, M.D.                 DATE OF BIRTH:  [birth year]   DATE OF ADMISSION:  01/28/2004  DATE OF DISCHARGE:                                HISTORY & PHYSICAL   CHIEF COMPLAINT:  Chest pain.   HISTORY OF PRESENT ILLNESS:  [name] is a very pleasant 65 year old  married white male with a history of moderate nonobstructive coronary artery  disease by catheterization January 2005, who presents to the emergency room  today with complaints of chest pain since last night.  It woke him from his  sleep last night.  Describes it as burning type pain that goes up into his  throat.  He notes an associated bitter taste in his mouth.  He denies any  pleuritic chest pain.  Nothing really makes it any better or worse.  He  denies any associated arm or jaw pain or shortness of breath, nausea,  vomiting, or diaphoresis.  He notes an episode last week where he got very  weak after standing up.  He is not really sure if he passed out.  He had not  felt well since then.  He does note that occasionally when he stands up from  a lying or sitting position, he will get weak.   PAST MEDICAL HISTORY:  1. Coronary artery disease.  A catheterization October 24, 2003, revealed     mid 30% left main stenosis, ostial 30 to 40% LAD stenosis, mid LAD 50%     stenosis, circumflex with an AV circumflex with 30% stenosis.  First     obtuse marginal 60 to 70% stenosis, RCA with mild irregularities and     normal EF of 55%.  He further underwent Cardiolite testing on November 02, 2003, that revealed no ischemia.  2. He has a prior history of multiple CVAs.  3. Anemia.  4. Borderline diabetes mellitus type 2.  5. Hypertension.  6. Hypercholesterolemia.  7. GERD.  8. He has an  abdominal aortic aneurysm followed by the [employer].  9. He has a history of traumatic brain injury in 1966.  10.      He is status post cholecystectomy.  11.      He is status post right leg injury with chronic pain in that leg.   ALLERGIES:  No known drug allergies.   MEDICATIONS:  1. Trazodone 100 mg q.h.s.  2. Prazosin 1 mg daily.  3. Atenolol 25 mg daily.  4. Indomethacin 25 mg daily.  5. Plavix 75 mg daily.  6. Tricor 40 mg daily.  7. Darvocet p.r.n.  8. Verapamil 120 mg daily.  9. Prevacid 30 mg  daily.  10.      Celexa 40 mg daily.  11.      Zocor 20 mg q.h.s.   SOCIAL HISTORY:  The patient lives in [place], [place], with his  wife.  He quit smoking 20 years ago.  He is currently not working.  He is a  [place] veteran.  He denies any alcohol or drug abuse.   FAMILY HISTORY:  Coronary artery disease.   REVIEW OF SYMPTOMS:  See HPI.  Denies any fevers, chills, sweats, headache,  sore throat, rash, shortness of breath, dyspnea on exertion, syncope, cough,  dysuria, hematuria, numbness, myalgias, arthralgias, nausea, vomiting,  diaphoresis, diarrhea, or skin changes.  He does note occasional bright red  blood per rectum secondary to hemorrhoids.  He has colonoscopies and last  had one about a year and a half ago with polypectomy.  The rest of the  review of systems are negative.   PHYSICAL EXAMINATION:  GENERAL APPEARANCE:  A well-nourished, well-developed  male in no acute distress.  VITAL SIGNS:  Blood pressure 123/53, pulse 57, respiratory rate 19, oxygen  saturation 96% on room air, temperature 98.1.  HEENT:  Head normocephalic and atraumatic.  Eyes:  PERRLA.  EOMI.  Sclerae  are clear.  ENT:  Oropharynx pink without exudate.  NECK:  Without JVD.  ENDOCRINE:  Without thyromegaly.  LYMPHS:  No lymphadenopathy.  LUNGS:  Clear to auscultation bilaterally.  CARDIOVASCULAR:  Normal S1 and S2, regular rate and rhythm with no murmurs,  rubs,  or gallops.  ABDOMEN:  Soft and nontender without hepatomegaly, normal active bowel  sounds.  SKIN:  Without rashes.  EXTREMITIES:  No clubbing, cyanosis, or edema.  MUSCULOSKELETAL:  Without spine or CVA tenderness.  NEUROLOGIC:  Alert and oriented x3, nonfocal.  VASCULAR:  No carotid bruits bilaterally.  Femoral artery pulses are 2+  bilaterally without bruits.   Chest x-ray reveals mild cardiomegaly, left lower lobe atelectasis versus  scar.  No change.  No acute disease.   EKG reveals sinus bradycardia with a heart rate of 49 and nonspecific STT  wave changes.   LABORATORY DATA:  Hemoglobin 10.9, hematocrit 32.8, platelet count 176,000,  white count 5100.  Point of care enzymes negative x1.   IMPRESSION:  1. Chest pain.  2. Moderate nonobstructive coronary artery disease January 2005.     a. 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  3. Normal ejection fraction.  4. Sinus bradycardia.  5. Anemia.  6. Recent spells of near syncope - question.  7. Treated hypercholesterolemia.  8. Borderline diabetes mellitus type 2.  9. Hypertension.  10.      Post traumatic stress disorder.  11.      Abdominal aortic aneurysm.     a. Followed by the [employer].  12.      Gastroesophageal reflux disease.   PLAN:  The patient was also seen and examined by Dr. Myrtis Ser today.  He will  perform the following plan.  The patient's pain is atypical and sounds  mainly similar to gastroesophageal reflux disease symptoms.  We will admit  the patient and rule him out for myocardial infarction with serial enzymes.  If his enzymes are negative then he will be discharged home tomorrow without  any further cardiac work-up.  His Prevacid will be increased to b.i.d.  dosing.  The patient does have marked sinus bradycardia on his telemetry.  Since he has had weak episodes recently, we will hold his atenolol and verapamil and watch his heart  rate off of these medications.  Will also need  to  carefully watch his blood pressure off these medications.  Since he does  have anemia, we will check iron studies and guaiac his stools.      Tereso Newcomer, P.A.                        Carleene Cooper, M.D.    SW/MEDQ  D:  01/28/2004  T:  01/28/2004  Job:  [id number]   cc:   Rollene Rotunda, M.D.   [place] Reid, M.D.  [employer]

## 2011-02-19 NOTE — Discharge Summary (Signed)
NAME:  BREVAN, LUBERTO NO.:  [id number]   MEDICAL RECORD NO.:  [id number]                   PATIENT TYPE:  INP   LOCATION:  3741                                 FACILITY:  [facility]   PHYSICIAN:  Willa Rough, M.D.                  DATE OF BIRTH:  05-29-1946   DATE OF ADMISSION:  01/28/2004  DATE OF DISCHARGE:  01/29/2004                           DISCHARGE SUMMARY - REFERRING   REFERRING PHYSICIAN:  Carleene Cooper, M.D.   PROCEDURE:  None.   REASON FOR ADMISSION:  Please refer to dictated admission note.   LABORATORY DATA:  A wbc of 5, hemoglobin 10.9, hematocrit 32.8 (MCV 81),  platelets 176 on admission.  INR 1.0.  Electrolytes and renal function  normal.  Normal liver enzymes.  Cardiac enzymes:  CPK/MB and troponin I  markers normal (x2).  Iron profile:  Decreased iron 38, normal TIBC 410,  decreased percent saturation 9, decreased ferritin 8.   HOSPITAL COURSE:  The patient was admitted directly from the office for  evaluation of atypical chest pain, in the context of known nonobstructive  coronary artery disease and a non-ischemic Cardiolite in January of 2005.   Serial cardiac markers were all within normal limits.  The patient had no  further chest pain and was clear for discharge the following morning.   Of note, the patient also presented in sinus bradycardia -  Verapamil/atenolol were placed on hold.  Heart rate remained in the 50 range  by the time of discharge.  Medications were not resumed at discharge.   The patient noted to have iron-deficiency anemia.  He has history of GERD  and was on Prevacid at home.  This was increased to b.i.d. dosing and  continued at discharge.  Recommendation is to refer for gastroenterology  consultation as an outpatient.   Dr. Antoine Poche concluded that the patient's chest pain was atypical and, given  no objective evidence of ischemia, no further cardiac work-up was  recommended.  The patient was cleared  for discharge on hospital day #1.   MEDICATION ADJUSTMENTS:  Increased dose of Prevacid, discontinuation of  Verapamil and atenolol.  The patient was also given a prescription for  Ambien given his reported success with this sleeping medication.   DISCHARGE MEDICATIONS:  1. Ambien 10 mg q.h.s. p.r.n.  2. Prevacid 30 mg b.i.d.  3. Enteric coated aspirin 81 mg daily.  4. Plavix 75 mg daily.  5. Prazosin 1 mg daily.  6. Indomethacin 25 mg daily.  7. Tricor 48 mg daily.  8. Celexa 40 mg daily.  9. Zocor 20 mg q.h.s.   DISCHARGE INSTRUCTIONS:  Stop taking Verapamil and atenolol.   Arrangements will be made for patient to follow up with Dr. Marga Melnick  in the next few weeks.  He will also be referred for gastroenterology  consultation.   The patient is instructed to follow up with Dr. Antoine Poche  as previously  scheduled.   DISCHARGE DIAGNOSES:  1. Atypical chest pain.     a. Normal serial cardiac markers.     b. History of nonobstructive coronary artery disease; non-ischemic        Cardiolite January 2005.  2. Sinus bradycardia.  3. Iron-deficiency anemia.  4. Borderline diabetes mellitus.  5. Dyslipidemia.  6. Known abdominal aortic aneurysm.     a. Followed at [facility].  7. History of hypertension.  8. Gastroesophageal reflux disease.      Gene Serpe, P.A. [facility]                      Willa Rough, M.D.    GS/MEDQ  D:  01/29/2004  T:  01/29/2004  Job:  [id number]   cc:   Titus Dubin. Alwyn Ren, M.D. [facility]   Carleene Cooper III, M.D.  [address]  [place]  [place] [address]  Fax: [phone]
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
## 2011-02-19 NOTE — H&P (Signed)
NAMECLERENCE, GUBSER               ACCOUNT NO.:  [id number]   MEDICAL RECORD NO.:  [id number]           PATIENT TYPE:   LOCATION:                                 FACILITY:   PHYSICIAN:  Broadus John T. Pamalee Leyden, MD    DATE OF BIRTH:   DATE OF ADMISSION:  DATE OF DISCHARGE:                                HISTORY & PHYSICAL   Dictation ends at this point.      Broadus John T. Pamalee Leyden, MD     WTP/MEDQ  D:  05/25/2006  T:  05/25/2006  Job:  [id number]

## 2011-02-22 ENCOUNTER — Ambulatory Visit (HOSPITAL_COMMUNITY): Payer: Federal, State, Local not specified - PPO

## 2011-02-24 ENCOUNTER — Ambulatory Visit (HOSPITAL_COMMUNITY): Payer: Federal, State, Local not specified - PPO

## 2011-02-26 ENCOUNTER — Ambulatory Visit (HOSPITAL_COMMUNITY): Payer: Federal, State, Local not specified - PPO

## 2011-03-01 ENCOUNTER — Ambulatory Visit (HOSPITAL_COMMUNITY): Payer: Federal, State, Local not specified - PPO

## 2011-03-03 ENCOUNTER — Ambulatory Visit (HOSPITAL_COMMUNITY): Payer: Federal, State, Local not specified - PPO

## 2011-03-05 ENCOUNTER — Telehealth: Payer: Self-pay | Admitting: Cardiology

## 2011-03-05 ENCOUNTER — Ambulatory Visit (HOSPITAL_COMMUNITY): Payer: Federal, State, Local not specified - PPO

## 2011-03-05 DIAGNOSIS — I10 Essential (primary) hypertension: Secondary | ICD-10-CM

## 2011-03-05 MED ORDER — METOPROLOL TARTRATE 50 MG PO TABS: 25.0000 mg | ORAL_TABLET | Freq: Two times a day (BID) | ORAL | Status: DC

## 2011-03-05 NOTE — Telephone Encounter (Signed)
Pt needs refill called in to Metoprolol 50mg  qd call into [facility] on [address]

## 2011-03-05 NOTE — Telephone Encounter (Signed)
Made pt aware RX refilled per his request--nt

## 2011-03-08 ENCOUNTER — Ambulatory Visit (HOSPITAL_COMMUNITY): Payer: Federal, State, Local not specified - PPO

## 2011-03-10 ENCOUNTER — Ambulatory Visit (HOSPITAL_COMMUNITY): Payer: Federal, State, Local not specified - PPO

## 2011-03-12 ENCOUNTER — Ambulatory Visit (HOSPITAL_COMMUNITY): Payer: Federal, State, Local not specified - PPO

## 2011-03-15 ENCOUNTER — Ambulatory Visit (HOSPITAL_COMMUNITY): Payer: Federal, State, Local not specified - PPO

## 2011-03-17 ENCOUNTER — Ambulatory Visit (HOSPITAL_COMMUNITY): Payer: Federal, State, Local not specified - PPO

## 2011-03-19 ENCOUNTER — Ambulatory Visit (HOSPITAL_COMMUNITY): Payer: Federal, State, Local not specified - PPO

## 2011-03-22 ENCOUNTER — Ambulatory Visit (HOSPITAL_COMMUNITY): Payer: Federal, State, Local not specified - PPO

## 2011-03-23 ENCOUNTER — Telehealth: Payer: Self-pay | Admitting: Cardiology

## 2011-03-23 NOTE — Telephone Encounter (Signed)
Pt needing a letter stating he can have tooth extraction, he is on Plavix.  Will forward to Dr Antoine Poche for approval

## 2011-03-23 NOTE — Telephone Encounter (Signed)
pt needs letter stating it's ok to go to [facility], for tooth extraction mail to [address] [phone]

## 2011-03-24 ENCOUNTER — Ambulatory Visit (HOSPITAL_COMMUNITY): Payer: Federal, State, Local not specified - PPO

## 2011-03-25 NOTE — Telephone Encounter (Signed)
If needed it would be ok for him to hold his plavix as needed.

## 2011-03-26 ENCOUNTER — Ambulatory Visit (HOSPITAL_COMMUNITY): Payer: Federal, State, Local not specified - PPO

## 2011-03-26 ENCOUNTER — Encounter: Payer: Self-pay | Admitting: *Deleted

## 2011-03-26 NOTE — Telephone Encounter (Signed)
Letter completed and mailed to address as requested,

## 2011-03-29 ENCOUNTER — Ambulatory Visit (HOSPITAL_COMMUNITY): Payer: Federal, State, Local not specified - PPO

## 2011-03-29 ENCOUNTER — Telehealth: Payer: Self-pay | Admitting: Cardiology

## 2011-03-29 NOTE — Telephone Encounter (Signed)
Pt aware letter was done and mailed 03/26/2011

## 2011-03-29 NOTE — Telephone Encounter (Signed)
Status of letter - re dental work at [facility]. appt on 7/14 @ 2 pm.

## 2011-03-31 ENCOUNTER — Ambulatory Visit (HOSPITAL_COMMUNITY): Payer: Federal, State, Local not specified - PPO

## 2011-04-02 ENCOUNTER — Ambulatory Visit (HOSPITAL_COMMUNITY): Payer: Federal, State, Local not specified - PPO

## 2011-04-05 ENCOUNTER — Ambulatory Visit (HOSPITAL_COMMUNITY): Payer: Federal, State, Local not specified - PPO

## 2011-04-07 ENCOUNTER — Ambulatory Visit (HOSPITAL_COMMUNITY): Payer: Federal, State, Local not specified - PPO

## 2011-04-09 ENCOUNTER — Ambulatory Visit (HOSPITAL_COMMUNITY): Payer: Federal, State, Local not specified - PPO

## 2011-04-12 ENCOUNTER — Ambulatory Visit (HOSPITAL_COMMUNITY): Payer: Federal, State, Local not specified - PPO

## 2011-04-14 ENCOUNTER — Ambulatory Visit (HOSPITAL_COMMUNITY): Payer: Federal, State, Local not specified - PPO

## 2011-04-16 ENCOUNTER — Ambulatory Visit (HOSPITAL_COMMUNITY): Payer: Federal, State, Local not specified - PPO

## 2011-04-19 ENCOUNTER — Ambulatory Visit (HOSPITAL_COMMUNITY): Payer: Federal, State, Local not specified - PPO

## 2011-04-21 ENCOUNTER — Ambulatory Visit (HOSPITAL_COMMUNITY): Payer: Federal, State, Local not specified - PPO

## 2011-04-23 ENCOUNTER — Ambulatory Visit (HOSPITAL_COMMUNITY): Payer: Federal, State, Local not specified - PPO

## 2011-04-26 ENCOUNTER — Ambulatory Visit (HOSPITAL_COMMUNITY): Payer: Federal, State, Local not specified - PPO

## 2011-04-28 ENCOUNTER — Ambulatory Visit (HOSPITAL_COMMUNITY): Payer: Federal, State, Local not specified - PPO

## 2011-04-30 ENCOUNTER — Ambulatory Visit (HOSPITAL_COMMUNITY): Payer: Federal, State, Local not specified - PPO

## 2011-05-03 ENCOUNTER — Ambulatory Visit (HOSPITAL_COMMUNITY): Payer: Federal, State, Local not specified - PPO

## 2011-05-05 ENCOUNTER — Ambulatory Visit (HOSPITAL_COMMUNITY): Payer: Federal, State, Local not specified - PPO

## 2011-05-07 ENCOUNTER — Ambulatory Visit (HOSPITAL_COMMUNITY): Payer: Federal, State, Local not specified - PPO

## 2011-05-10 ENCOUNTER — Ambulatory Visit (HOSPITAL_COMMUNITY): Payer: Federal, State, Local not specified - PPO

## 2011-05-12 ENCOUNTER — Ambulatory Visit (HOSPITAL_COMMUNITY): Payer: Federal, State, Local not specified - PPO

## 2011-05-14 ENCOUNTER — Ambulatory Visit (HOSPITAL_COMMUNITY): Payer: Federal, State, Local not specified - PPO

## 2011-05-17 ENCOUNTER — Ambulatory Visit (HOSPITAL_COMMUNITY): Payer: Federal, State, Local not specified - PPO

## 2011-05-19 ENCOUNTER — Ambulatory Visit (HOSPITAL_COMMUNITY): Payer: Federal, State, Local not specified - PPO

## 2011-05-21 ENCOUNTER — Ambulatory Visit (HOSPITAL_COMMUNITY): Payer: Federal, State, Local not specified - PPO

## 2011-05-24 ENCOUNTER — Ambulatory Visit (HOSPITAL_COMMUNITY): Payer: Federal, State, Local not specified - PPO

## 2011-05-26 ENCOUNTER — Ambulatory Visit (HOSPITAL_COMMUNITY): Payer: Federal, State, Local not specified - PPO

## 2011-05-28 ENCOUNTER — Ambulatory Visit (HOSPITAL_COMMUNITY): Payer: Federal, State, Local not specified - PPO

## 2011-06-01 ENCOUNTER — Encounter: Payer: Self-pay | Admitting: Cardiology

## 2011-06-01 ENCOUNTER — Ambulatory Visit (INDEPENDENT_AMBULATORY_CARE_PROVIDER_SITE_OTHER): Payer: Federal, State, Local not specified - PPO | Admitting: Cardiology

## 2011-06-01 DIAGNOSIS — I6529 Occlusion and stenosis of unspecified carotid artery: Secondary | ICD-10-CM

## 2011-06-01 DIAGNOSIS — I2510 Atherosclerotic heart disease of native coronary artery without angina pectoris: Secondary | ICD-10-CM

## 2011-06-01 DIAGNOSIS — E7800 Pure hypercholesterolemia, unspecified: Secondary | ICD-10-CM

## 2011-06-01 DIAGNOSIS — I10 Essential (primary) hypertension: Secondary | ICD-10-CM

## 2011-06-01 DIAGNOSIS — F329 Major depressive disorder, single episode, unspecified: Secondary | ICD-10-CM

## 2011-06-01 DIAGNOSIS — E785 Hyperlipidemia, unspecified: Secondary | ICD-10-CM

## 2011-06-01 NOTE — Assessment & Plan Note (Signed)
I will order a lipid profile.  The goal will be an LDL less than 70 and HDL greater than 40.

## 2011-06-01 NOTE — Patient Instructions (Signed)
Please schedule fasting lab work.  The current medical regimen is effective;  continue present plan and medications.  Follow up in 6 months with Dr Antoine Poche.  You will receive a letter in the mail 2 months before you are due.  Please call us when you receive this letter to schedule your follow up appointment.

## 2011-06-01 NOTE — Assessment & Plan Note (Signed)
The blood pressure is at target. No change in medications is indicated. We will continue with therapeutic lifestyle changes (TLC).  

## 2011-06-01 NOTE — Assessment & Plan Note (Signed)
The patient has no new sypmtoms.  No further cardiovascular testing is indicated.  We will continue with aggressive risk reduction and meds as listed.  

## 2011-06-01 NOTE — Progress Notes (Signed)
HPI The patient presents for followup after bypass surgery.  Since I last saw her she has done well.  The patient denies any new symptoms such as chest discomfort, neck or arm discomfort. There has been no new shortness of breath, PND or orthopnea. There have been no reported palpitations, presyncope or syncope.  Unfortunately study somewhat limited by depression. His wife says he lays in bed most of the time. He is not exercising routinely. He does have some problems with occasional loss of balance.  .No Known Allergies  Current Outpatient Prescriptions  Medication Sig Dispense Refill  . aspirin 81 MG tablet Take 81 mg by mouth daily.        . clonazePAM (KLONOPIN) 0.5 MG tablet Take 0.5 mg by mouth 2 (two) times daily as needed.        . clopidogrel (PLAVIX) 75 MG tablet Take 75 mg by mouth daily.        . divalproex (DEPAKOTE) 250 MG EC tablet Take 500 mg by mouth 2 (two) times daily.       . metoprolol (LOPRESSOR) 50 MG tablet Take 0.5 tablets (25 mg total) by mouth 2 (two) times daily.  180 tablet  3  . QUEtiapine (SEROQUEL) 200 MG tablet Take 100 mg by mouth at bedtime.        . simvastatin (ZOCOR) 20 MG tablet Take 1 tablet (20 mg total) by mouth every evening.  30 tablet  11  . Tamsulosin HCl (FLOMAX) 0.4 MG CAPS Take 0.4 mg by mouth daily.        . traZODone (DESYREL) 50 MG tablet Take 50 mg by mouth at bedtime.        Marland Kitchen venlafaxine (EFFEXOR) 75 MG tablet Take 75 mg by mouth as directed. 2 po qam and 1 po qhs         Past Medical History  Diagnosis Date  . HLD (hyperlipidemia)   . HTN (hypertension)   . History of TIAs   . GERD (gastroesophageal reflux disease)   . Depression   . PTSD (post-traumatic stress disorder)   . CVA (cerebral vascular accident)   . History of colonic polyps   . Vitamin B12 deficiency   . Chronic leg pain   . Melanoma     L arm  . Anemia     s/p transfusions  . DM2 (diabetes mellitus, type 2)   . COPD (chronic obstructive pulmonary disease)   .  CAD (coronary artery disease)   . Fatty liver disease, nonalcoholic   . Obesity   . Carotid stenosis     Past Surgical History  Procedure Date  . Cholecystectomy   . Coronary artery bypass graft     LIMA to LAD, SVG to OM, SVG to PDA 12/30/10  . Ercp   . Leg surgery     right    ROS:  As stated in the HPI and negative for all other systems.  PHYSICAL EXAM BP 102/62  Pulse 53  Resp 16  Ht 5\' 9"  (1.753 m)  Wt 184 lb (83.462 kg)  BMI 27.17 kg/m2 GENERAL:  Well appearing HEENT:  Pupils equal round and reactive, fundi not visualized, oral mucosa unremarkable, poor dention NECK:  No jugular venous distention, waveform within normal limits, carotid upstroke brisk and symmetric, no bruits, no thyromegaly LYMPHATICS:  No cervical, inguinal adenopathy LUNGS:  Clear to auscultation bilaterally BACK:  No CVA tenderness CHEST:  Well healed sternotomy scar. HEART:  PMI not displaced or sustained,S1 and  S2 within normal limits, no S3, no S4, no clicks, no rubs, no murmurs ABD:  Flat, positive bowel sounds normal in frequency in pitch, no bruits, no rebound, no guarding, no midline pulsatile mass, no hepatomegaly, no splenomegaly EXT:  2 plus pulses throughout, no edema, no cyanosis no clubbing, well healed endoscopic SVG scars SKIN:  No rashes no nodules NEURO:  Cranial nerves II through XII grossly intact, motor grossly intact throughout PSYCH:  Cognitively intact, oriented to person place and time.examj  EKG:  Sinus rhythm, rate 53, axis within normal limits, intervals within normal limits no acute ST-T wave changes.  ASSESSMENT AND PLAN

## 2011-06-01 NOTE — Assessment & Plan Note (Signed)
He is due to have follow up of this in [place].

## 2011-06-01 NOTE — Assessment & Plan Note (Signed)
This is being managed by the [place].  We discussed the importance of active follow up and treatment.

## 2011-06-02 ENCOUNTER — Other Ambulatory Visit (INDEPENDENT_AMBULATORY_CARE_PROVIDER_SITE_OTHER): Payer: Federal, State, Local not specified - PPO | Admitting: *Deleted

## 2011-06-02 ENCOUNTER — Other Ambulatory Visit: Payer: Self-pay | Admitting: Cardiology

## 2011-06-02 DIAGNOSIS — E7800 Pure hypercholesterolemia, unspecified: Secondary | ICD-10-CM

## 2011-06-02 LAB — LIPID PANEL
Cholesterol: 135 mg/dL (ref 0–200)
VLDL: 43 mg/dL — ABNORMAL HIGH (ref 0.0–40.0)

## 2011-06-02 LAB — HEPATIC FUNCTION PANEL
AST: 11 U/L (ref 0–37)
Albumin: 3.8 g/dL (ref 3.5–5.2)

## 2011-06-03 NOTE — Progress Notes (Signed)
Quick Note:  RN Preliminarily reviewed. Forwarded to MD desktop for review and signature ______ 

## 2011-06-03 NOTE — Progress Notes (Signed)
Quick Note:  RN Preliminarily reviewed. Forwarded to MD desktop for review and signature ______

## 2011-06-18 ENCOUNTER — Telehealth: Payer: Self-pay | Admitting: Cardiology

## 2011-06-18 NOTE — Telephone Encounter (Signed)
Discussed results with pt.

## 2011-06-18 NOTE — Telephone Encounter (Signed)
Pt returning call from our office from yesterday, pt doesn't know who called nor what they were calling about. Please return pt call to discuss further.

## 2011-06-30 LAB — CBC
HCT: 29.4 — ABNORMAL LOW
MCHC: 30.4
MCV: 63.6 — ABNORMAL LOW
Platelets: 149 — ABNORMAL LOW
RBC: 4.19 — ABNORMAL LOW
RDW: 24.4 — ABNORMAL HIGH

## 2011-06-30 LAB — BASIC METABOLIC PANEL
BUN: 13
Calcium: 8.4
Creatinine, Ser: 1.33
GFR calc non Af Amer: 54 — ABNORMAL LOW
Glucose, Bld: 93

## 2011-06-30 LAB — COMPREHENSIVE METABOLIC PANEL
BUN: 15
CO2: 21
Calcium: 8.3 — ABNORMAL LOW
Creatinine, Ser: 1.42
GFR calc Af Amer: >60
GFR calc non Af Amer: 50 — ABNORMAL LOW
Glucose, Bld: 161 — ABNORMAL HIGH

## 2011-06-30 LAB — POCT CARDIAC MARKERS
Myoglobin, poc: 83.4
Operator id: 284251
Troponin i, poc: <0.05

## 2011-06-30 LAB — URINALYSIS, ROUTINE W REFLEX MICROSCOPIC
Bilirubin Urine: NEGATIVE
Ketones, ur: NEGATIVE
Nitrite: NEGATIVE
Protein, ur: NEGATIVE
Urobilinogen, UA: 0.2

## 2011-06-30 LAB — TYPE AND SCREEN
ABO/RH(D): A POS
Antibody Screen: NEGATIVE

## 2011-06-30 LAB — APTT: aPTT: 30

## 2011-06-30 LAB — DIFFERENTIAL
Basophils Absolute: 0
Lymphocytes Relative: 25
Monocytes Absolute: 0.6
Monocytes Relative: 6
Neutro Abs: 6.2
Neutrophils Relative %: 65

## 2011-06-30 LAB — HEMOGLOBIN AND HEMATOCRIT, BLOOD: HCT: 32.2 — ABNORMAL LOW

## 2011-06-30 LAB — PROTIME-INR: INR: 1

## 2011-07-16 LAB — DIFFERENTIAL
Basophils Absolute: 0
Basophils Relative: 0
Basophils Relative: 0
Eosinophils Absolute: 0
Eosinophils Relative: 0
Lymphocytes Relative: 33
Lymphocytes Relative: 5 — ABNORMAL LOW
Lymphs Abs: 0.3 — ABNORMAL LOW
Lymphs Abs: 1.2
Monocytes Absolute: 0.5
Monocytes Absolute: 0.5
Monocytes Relative: 0 — ABNORMAL LOW
Monocytes Relative: 15 — ABNORMAL HIGH
Monocytes Relative: 5
Neutro Abs: 1.8
Neutro Abs: 3.1
Smear Review: DECREASED

## 2011-07-16 LAB — CARDIAC PANEL(CRET KIN+CKTOT+MB+TROPI)
CK, MB: 6.6 — ABNORMAL HIGH
Relative Index: 0.6
Relative Index: 0.7
Troponin I: 0.09 — ABNORMAL HIGH
Troponin I: 0.11 — ABNORMAL HIGH

## 2011-07-16 LAB — URINE CULTURE

## 2011-07-16 LAB — URINALYSIS, ROUTINE W REFLEX MICROSCOPIC
Nitrite: POSITIVE — AB
Specific Gravity, Urine: >1.03 — ABNORMAL HIGH
pH: 6

## 2011-07-16 LAB — POCT CARDIAC MARKERS
CKMB, poc: <1 — ABNORMAL LOW
CKMB, poc: <1 — ABNORMAL LOW
CKMB, poc: 1
Myoglobin, poc: >500
Myoglobin, poc: >500
Myoglobin, poc: 75.5
Operator id: 277751
Operator id: 277751
Operator id: 257131
Troponin i, poc: <0.05
Troponin i, poc: <0.05
Troponin i, poc: <0.05

## 2011-07-16 LAB — CBC
HCT: 28.5 — ABNORMAL LOW
HCT: 29 — ABNORMAL LOW
HCT: 29.2 — ABNORMAL LOW
Hemoglobin: 10.2 — ABNORMAL LOW
Hemoglobin: 10.7 — ABNORMAL LOW
Hemoglobin: 12 — ABNORMAL LOW
Hemoglobin: 9.9 — ABNORMAL LOW
MCHC: 33.9
MCHC: 34
MCHC: 34.3
MCHC: 34.8
MCV: 87.2
MCV: 87.8
Platelets: 70 — ABNORMAL LOW
Platelets: 80 — ABNORMAL LOW
RBC: 3.46 — ABNORMAL LOW
RBC: 3.59 — ABNORMAL LOW
RBC: 4.03 — ABNORMAL LOW
RDW: 16.1 — ABNORMAL HIGH
RDW: 16.2 — ABNORMAL HIGH
RDW: 16.4 — ABNORMAL HIGH
WBC: 10.7 — ABNORMAL HIGH
WBC: 3.7 — ABNORMAL LOW
WBC: 6.6

## 2011-07-16 LAB — BASIC METABOLIC PANEL
BUN: 11
BUN: 14
BUN: 20
CO2: 20
CO2: 23
CO2: 24
Calcium: 8.2 — ABNORMAL LOW
Calcium: 8.5
Calcium: 8.7
Chloride: 108
Chloride: 99
Creatinine, Ser: 1.29
Creatinine, Ser: 1.39
GFR calc Af Amer: 43 — ABNORMAL LOW
GFR calc Af Amer: >60
GFR calc non Af Amer: 56 — ABNORMAL LOW
GFR calc non Af Amer: >60
Glucose, Bld: 100 — ABNORMAL HIGH
Glucose, Bld: 112 — ABNORMAL HIGH
Glucose, Bld: 112 — ABNORMAL HIGH
Potassium: 2.8 — ABNORMAL LOW
Potassium: 3.5
Potassium: 3.8
Sodium: 132 — ABNORMAL LOW
Sodium: 139
Sodium: 143

## 2011-07-16 LAB — I-STAT 8, (EC8 V) (CONVERTED LAB)
Acid-base deficit: 8 — ABNORMAL HIGH
BUN: 19
BUN: 10
Bicarbonate: 16.4 — ABNORMAL LOW
Chloride: 101
Chloride: 107
Glucose, Bld: 140 — ABNORMAL HIGH
Glucose, Bld: 83
HCT: 37 — ABNORMAL LOW
Hemoglobin: 12.6 — ABNORMAL LOW
Operator id: 277751
Potassium: 3.3 — ABNORMAL LOW
Potassium: 4.1
Sodium: 133 — ABNORMAL LOW
TCO2: 17
TCO2: 24
pCO2, Ven: 30.4 — ABNORMAL LOW
pH, Ven: 7.34 — ABNORMAL HIGH
pH, Ven: 7.459 — ABNORMAL HIGH

## 2011-07-16 LAB — POCT I-STAT 3, ART BLOOD GAS (G3+)
Acid-base deficit: 7 — ABNORMAL HIGH
Bicarbonate: 16.2 — ABNORMAL LOW
O2 Saturation: 100
Operator id: 270211
Patient temperature: 105
TCO2: 17
pCO2 arterial: 29.9 — ABNORMAL LOW
pH, Arterial: 7.357
pO2, Arterial: 245 — ABNORMAL HIGH

## 2011-07-16 LAB — CULTURE, BLOOD (ROUTINE X 2)

## 2011-07-16 LAB — HEPATIC FUNCTION PANEL
ALT: 22
AST: 31
Albumin: 3.1 — ABNORMAL LOW
Alkaline Phosphatase: 162 — ABNORMAL HIGH
Total Protein: 5.8 — ABNORMAL LOW

## 2011-07-16 LAB — CLOSTRIDIUM DIFFICILE EIA: C difficile Toxins A+B, EIA: NEGATIVE

## 2011-07-16 LAB — MAGNESIUM
Magnesium: 2.1
Magnesium: 2.2

## 2011-07-16 LAB — URINE MICROSCOPIC-ADD ON

## 2011-07-16 LAB — CK TOTAL AND CKMB (NOT AT ARMC)
CK, MB: 2.5
CK, MB: 3.4
Relative Index: 0.3
Total CK: 1310 — ABNORMAL HIGH

## 2011-07-16 LAB — PSA: PSA: 13.74 — ABNORMAL HIGH

## 2011-07-16 LAB — TROPONIN I: Troponin I: 0.16 — ABNORMAL HIGH

## 2011-07-16 LAB — POCT I-STAT CREATININE
Creatinine, Ser: 1.6 — ABNORMAL HIGH
Operator id: 277751

## 2011-07-18 ENCOUNTER — Emergency Department (HOSPITAL_COMMUNITY)
Admission: EM | Admit: 2011-07-18 | Discharge: 2011-07-18 | Disposition: A | Discharge status: Home / Self Care | Payer: Medicare Other | Attending: Emergency Medicine | Admitting: Emergency Medicine

## 2011-07-18 ENCOUNTER — Emergency Department (HOSPITAL_COMMUNITY): Payer: Medicare Other

## 2011-07-18 DIAGNOSIS — Y92009 Unspecified place in unspecified non-institutional (private) residence as the place of occurrence of the external cause: Secondary | ICD-10-CM | POA: Insufficient documentation

## 2011-07-18 DIAGNOSIS — F341 Dysthymic disorder: Secondary | ICD-10-CM | POA: Insufficient documentation

## 2011-07-18 DIAGNOSIS — S0990XA Unspecified injury of head, initial encounter: Secondary | ICD-10-CM | POA: Insufficient documentation

## 2011-07-18 DIAGNOSIS — W19XXXA Unspecified fall, initial encounter: Secondary | ICD-10-CM | POA: Insufficient documentation

## 2011-07-18 DIAGNOSIS — K219 Gastro-esophageal reflux disease without esophagitis: Secondary | ICD-10-CM | POA: Insufficient documentation

## 2011-07-18 DIAGNOSIS — Z8673 Personal history of transient ischemic attack (TIA), and cerebral infarction without residual deficits: Secondary | ICD-10-CM | POA: Insufficient documentation

## 2011-07-18 DIAGNOSIS — E119 Type 2 diabetes mellitus without complications: Secondary | ICD-10-CM | POA: Insufficient documentation

## 2011-07-18 DIAGNOSIS — Z7982 Long term (current) use of aspirin: Secondary | ICD-10-CM | POA: Insufficient documentation

## 2011-07-18 DIAGNOSIS — R51 Headache: Secondary | ICD-10-CM | POA: Insufficient documentation

## 2011-07-18 DIAGNOSIS — Z79899 Other long term (current) drug therapy: Secondary | ICD-10-CM | POA: Insufficient documentation

## 2011-07-18 DIAGNOSIS — I10 Essential (primary) hypertension: Secondary | ICD-10-CM | POA: Insufficient documentation

## 2011-07-18 DIAGNOSIS — M542 Cervicalgia: Secondary | ICD-10-CM | POA: Insufficient documentation

## 2011-07-18 DIAGNOSIS — S51809A Unspecified open wound of unspecified forearm, initial encounter: Secondary | ICD-10-CM | POA: Insufficient documentation

## 2011-07-18 DIAGNOSIS — I2510 Atherosclerotic heart disease of native coronary artery without angina pectoris: Secondary | ICD-10-CM | POA: Insufficient documentation

## 2011-07-18 LAB — POCT I-STAT, CHEM 8
Calcium, Ion: 1.15 mmol/L (ref 1.12–1.32)
HCT: 38 % — ABNORMAL LOW (ref 39.0–52.0)
Hemoglobin: 12.9 g/dL — ABNORMAL LOW (ref 13.0–17.0)
Sodium: 141 mEq/L (ref 135–145)
TCO2: 24 mmol/L (ref 0–100)

## 2011-07-18 LAB — POCT I-STAT TROPONIN I: Troponin i, poc: 0 ng/mL (ref 0.00–0.08)

## 2011-07-28 ENCOUNTER — Encounter (INDEPENDENT_AMBULATORY_CARE_PROVIDER_SITE_OTHER): Payer: Federal, State, Local not specified - PPO | Admitting: Cardiology

## 2011-07-28 DIAGNOSIS — R55 Syncope and collapse: Secondary | ICD-10-CM

## 2011-07-28 DIAGNOSIS — I6529 Occlusion and stenosis of unspecified carotid artery: Secondary | ICD-10-CM

## 2011-07-29 ENCOUNTER — Telehealth: Payer: Self-pay | Admitting: Cardiology

## 2011-07-29 NOTE — Telephone Encounter (Signed)
Pt states he had a doppler study yesterday and that he was told he needs surgery.  Nothing has been documented as such at this time.  Will obtain results and call pt back once reviewed with Dr Antoine Poche.

## 2011-07-29 NOTE — Telephone Encounter (Signed)
Please call about when he is to have surgery.

## 2011-07-30 ENCOUNTER — Telehealth: Payer: Self-pay | Admitting: Cardiology

## 2011-07-30 NOTE — Telephone Encounter (Signed)
Pt is aware of the carotid doppler results.  He states that he needs surgical clearance to have the rest of his teeth removed.  This is to be done at the dental clinic and "[facility]".  He needs a letter stating he has clearance and would like it faxed to the clinic and a copy mailed to his home address.  Pt aware I will forward information to Dr Antoine Poche.

## 2011-07-30 NOTE — Telephone Encounter (Signed)
Based on the results of the carotid dopplers no surgery is indicated at this time.  There is another phone encounter open on this already and further documentation will be place in it.

## 2011-07-30 NOTE — Telephone Encounter (Signed)
Pt returning call to [facility]. Please call back.

## 2011-07-30 NOTE — Telephone Encounter (Signed)
Pt wants to know when he is to have surgery on his neck. Please call

## 2011-08-02 NOTE — Telephone Encounter (Signed)
  [name] is at acceptable risk, from a cardiac standpoint,  for his upcoming procedure according to ACC/AHA guidelines.  It is ok to proceed  without further cardiac testing.  The Plavix can be held as needed and restarted when it is felt safe from a surgical standpoint.   Please call Dept: [phone] with any additional questions or call me  ([phone]).   Sincerely,   Rollene Rotunda, MD

## 2011-08-03 NOTE — Telephone Encounter (Signed)
Faxed information to [phone] Dr. Miguel Aschoff Dental Resident

## 2011-08-06 ENCOUNTER — Telehealth: Payer: Self-pay | Admitting: Cardiology

## 2011-08-06 NOTE — Telephone Encounter (Signed)
Please see telephone note from 10/25.  Note was mailed to pts home address and faxed to the dental clinic.  Pt is aware.

## 2011-08-06 NOTE — Telephone Encounter (Signed)
Pt needs a letter sent to [facility] in order to get his dental surgery done and he was told at last visit the letter would be sent out and it still has not been done and it is getting close to the surgery date and he still has no letter

## 2011-12-02 ENCOUNTER — Other Ambulatory Visit: Payer: Self-pay

## 2011-12-02 ENCOUNTER — Emergency Department (HOSPITAL_COMMUNITY)
Admission: EM | Admit: 2011-12-02 | Discharge: 2011-12-03 | Disposition: A | Discharge status: Home Health | Payer: Medicare Other | Attending: Emergency Medicine | Admitting: Emergency Medicine

## 2011-12-02 ENCOUNTER — Encounter (HOSPITAL_COMMUNITY): Payer: Self-pay | Admitting: Emergency Medicine

## 2011-12-02 DIAGNOSIS — I2510 Atherosclerotic heart disease of native coronary artery without angina pectoris: Secondary | ICD-10-CM | POA: Insufficient documentation

## 2011-12-02 DIAGNOSIS — Z79899 Other long term (current) drug therapy: Secondary | ICD-10-CM | POA: Insufficient documentation

## 2011-12-02 DIAGNOSIS — K625 Hemorrhage of anus and rectum: Secondary | ICD-10-CM

## 2011-12-02 DIAGNOSIS — E785 Hyperlipidemia, unspecified: Secondary | ICD-10-CM | POA: Insufficient documentation

## 2011-12-02 DIAGNOSIS — J4489 Other specified chronic obstructive pulmonary disease: Secondary | ICD-10-CM | POA: Insufficient documentation

## 2011-12-02 DIAGNOSIS — E119 Type 2 diabetes mellitus without complications: Secondary | ICD-10-CM | POA: Insufficient documentation

## 2011-12-02 DIAGNOSIS — F329 Major depressive disorder, single episode, unspecified: Secondary | ICD-10-CM | POA: Insufficient documentation

## 2011-12-02 DIAGNOSIS — Z7982 Long term (current) use of aspirin: Secondary | ICD-10-CM | POA: Insufficient documentation

## 2011-12-02 DIAGNOSIS — R231 Pallor: Secondary | ICD-10-CM | POA: Insufficient documentation

## 2011-12-02 DIAGNOSIS — R1031 Right lower quadrant pain: Secondary | ICD-10-CM | POA: Insufficient documentation

## 2011-12-02 DIAGNOSIS — I10 Essential (primary) hypertension: Secondary | ICD-10-CM | POA: Insufficient documentation

## 2011-12-02 DIAGNOSIS — J449 Chronic obstructive pulmonary disease, unspecified: Secondary | ICD-10-CM | POA: Insufficient documentation

## 2011-12-02 DIAGNOSIS — Z8673 Personal history of transient ischemic attack (TIA), and cerebral infarction without residual deficits: Secondary | ICD-10-CM | POA: Insufficient documentation

## 2011-12-02 DIAGNOSIS — F3289 Other specified depressive episodes: Secondary | ICD-10-CM | POA: Insufficient documentation

## 2011-12-02 DIAGNOSIS — K921 Melena: Secondary | ICD-10-CM | POA: Insufficient documentation

## 2011-12-02 DIAGNOSIS — K219 Gastro-esophageal reflux disease without esophagitis: Secondary | ICD-10-CM | POA: Insufficient documentation

## 2011-12-02 LAB — COMPREHENSIVE METABOLIC PANEL
AST: 9 U/L (ref 0–37)
Albumin: 3.4 g/dL — ABNORMAL LOW (ref 3.5–5.2)
Alkaline Phosphatase: 45 U/L (ref 39–117)
BUN: 16 mg/dL (ref 6–23)
Chloride: 101 mEq/L (ref 96–112)
Creatinine, Ser: 1.28 mg/dL (ref 0.50–1.35)
Potassium: 3.8 mEq/L (ref 3.5–5.1)
Total Bilirubin: 0.1 mg/dL — ABNORMAL LOW (ref 0.3–1.2)
Total Protein: 6 g/dL (ref 6.0–8.3)

## 2011-12-02 LAB — DIFFERENTIAL
Basophils Absolute: 0 10*3/uL (ref 0.0–0.1)
Basophils Relative: 0 % (ref 0–1)
Eosinophils Absolute: 0.2 10*3/uL (ref 0.0–0.7)
Neutro Abs: 2.5 10*3/uL (ref 1.7–7.7)
Neutrophils Relative %: 50 % (ref 43–77)

## 2011-12-02 LAB — CBC
MCH: 31.2 pg (ref 26.0–34.0)
MCHC: 34.5 g/dL (ref 30.0–36.0)
RDW: 13 % (ref 11.5–15.5)

## 2011-12-02 LAB — OCCULT BLOOD, POC DEVICE: Fecal Occult Bld: POSITIVE

## 2011-12-02 LAB — CARDIAC PANEL(CRET KIN+CKTOT+MB+TROPI)
CK, MB: 2.3 ng/mL (ref 0.3–4.0)
Total CK: 46 U/L (ref 7–232)
Troponin I: <0.3 ng/mL (ref <0.30)

## 2011-12-02 MED ORDER — SODIUM CHLORIDE 0.9 % IV SOLN: Freq: Once | INTRAVENOUS | Status: DC

## 2011-12-02 NOTE — ED Provider Notes (Signed)
History     CSN: [id number]  Arrival date & time 12/02/11  2127   First MD Initiated Contact with Patient 12/02/11 2207      Chief Complaint  Patient presents with  . Rectal Bleeding    (Consider location/radiation/quality/duration/timing/severity/associated sxs/prior treatment) HPI Comments: Patient has had 2 episodes of blood passing in the commode with clots without stool.  Since 4:30 this afternoon.  He doesn't feel dizzy.  He is not tachycardic, is not having any chest pain,  doesn't feel short of breath.  He does have a history of same has been hospitalized several times for blood transfusions without a definitive diagnosis as to cause.  He currently is taking Plavix post CABG several months ago  Patient is a 66 y.o. male presenting with hematochezia. The history is provided by the patient.  Rectal Bleeding  The current episode started today. The problem occurs rarely. The problem has been gradually worsening. The patient is experiencing no pain. The stool is described as bloody. Pertinent negatives include no fever, no abdominal pain, no diarrhea, no nausea, no rectal pain and no chest pain.    Past Medical History  Diagnosis Date  . HLD (hyperlipidemia)   . HTN (hypertension)   . History of TIAs   . GERD (gastroesophageal reflux disease)   . Depression   . PTSD (post-traumatic stress disorder)   . CVA (cerebral vascular accident)   . History of colonic polyps   . Vitamin B12 deficiency   . Chronic leg pain   . Melanoma     L arm  . Anemia     s/p transfusions  . DM2 (diabetes mellitus, type 2)   . COPD (chronic obstructive pulmonary disease)   . CAD (coronary artery disease)   . Fatty liver disease, nonalcoholic   . Obesity   . Carotid stenosis     Past Surgical History  Procedure Date  . Cholecystectomy   . Coronary artery bypass graft     LIMA to LAD, SVG to OM, SVG to PDA 12/30/10  . Ercp   . Leg surgery     right    Family History  Problem Relation  Age of Onset  . Hypertension      History  Substance Use Topics  . Smoking status: Former Games developer  . Smokeless tobacco: Not on file  . Alcohol Use: No      Review of Systems  Constitutional: Negative for fever and chills.  HENT: Negative for congestion.   Respiratory: Negative for shortness of breath.   Cardiovascular: Negative for chest pain.  Gastrointestinal: Positive for blood in stool and hematochezia. Negative for nausea, abdominal pain, diarrhea, abdominal distention and rectal pain.  Genitourinary: Negative for flank pain.  Musculoskeletal: Negative for back pain.  Neurological: Negative for dizziness and weakness.    Allergies  Review of patient's allergies indicates no known allergies.  Home Medications   Current Outpatient Rx  Name Route Sig Dispense Refill  . ASPIRIN 81 MG PO TABS Oral Take 81 mg by mouth daily.      Marland Kitchen CLONAZEPAM 0.5 MG PO TABS Oral Take 0.5 mg by mouth 2 (two) times daily as needed.      . CLOPIDOGREL BISULFATE 75 MG PO TABS Oral Take 75 mg by mouth daily.      Marland Kitchen DIVALPROEX SODIUM 250 MG PO TBEC Oral Take 500 mg by mouth 2 (two) times daily.     Marland Kitchen METOPROLOL TARTRATE 50 MG PO TABS Oral Take 0.5  tablets (25 mg total) by mouth 2 (two) times daily. 180 tablet 3  . QUETIAPINE FUMARATE 200 MG PO TABS Oral Take 100 mg by mouth at bedtime.      Marland Kitchen SIMVASTATIN 20 MG PO TABS Oral Take 1 tablet (20 mg total) by mouth every evening. 30 tablet 11  . TAMSULOSIN HCL 0.4 MG PO CAPS Oral Take 0.4 mg by mouth daily.      . TRAZODONE HCL 50 MG PO TABS Oral Take 50 mg by mouth at bedtime.      . VENLAFAXINE HCL 75 MG PO TABS Oral Take 75 mg by mouth as directed. 2 po qam and 1 po qhs       BP 123/59  Pulse 68  Temp(Src) 99.1 F (37.3 C) (Oral)  Resp 16  SpO2 96%  Physical Exam  Constitutional: He is oriented to person, place, and time. He appears well-developed and well-nourished.  HENT:  Head: Normocephalic.  Eyes: Pupils are equal, round, and reactive  to light.  Neck: Normal range of motion.  Cardiovascular: Normal rate and regular rhythm.   Pulmonary/Chest: Effort normal and breath sounds normal.  Abdominal: Soft. Bowel sounds are normal. There is tenderness.    Genitourinary: Guaiac positive stool.  Musculoskeletal: Normal range of motion.  Neurological: He is alert and oriented to person, place, and time.  Skin: Skin is warm and dry. There is pallor.    ED Course  Procedures (including critical care time)  Labs Reviewed  CBC - Abnormal; Notable for the following:    RBC 3.91 (*)    Hemoglobin 12.2 (*)    HCT 35.4 (*)    All other components within normal limits  COMPREHENSIVE METABOLIC PANEL - Abnormal; Notable for the following:    Glucose, Bld 121 (*)    Albumin 3.4 (*)    Total Bilirubin 0.1 (*)    GFR calc non Af Amer 56 (*)    GFR calc Af Amer 65 (*)    All other components within normal limits  DIFFERENTIAL  TYPE AND SCREEN  CARDIAC PANEL(CRET KIN+CKTOT+MB+TROPI)  OCCULT BLOOD, POC DEVICE  ABO/RH   No results found.   1. PRB (rectal bleeding)     ED ECG REPORT   Date: 12/03/2011  EKG Time: 1:03 AM  Rate: 60  Rhythm: normal sinus rhythm,  unchanged from previous tracings  Axis: normal  Intervals:none  ST&T Change:none  Narrative Interpretation: borderline            MDM  guiac +on  rectal exam without hemorrhoids or rectal fissure. Assessment patient with Dr. Juleen China who examined the patient as well        Arman Filter, NP 12/03/11 0102  Arman Filter, NP 12/03/11 [phone]

## 2011-12-02 NOTE — ED Notes (Signed)
MD at bedside. Unable to complete EKG at this time.

## 2011-12-02 NOTE — ED Notes (Signed)
Pt alert, nad, c/o bright red rectal bleeding, onset today, pt with hx of same, resp even unlabored, skin pwd

## 2011-12-02 NOTE — ED Notes (Signed)
patietn sts that he had a BM ?dark in color ?black but also had bright red blood in stool.  Patient has +_EPS

## 2011-12-03 LAB — TYPE AND SCREEN
ABO/RH(D): A POS
Antibody Screen: NEGATIVE

## 2011-12-03 LAB — ABO/RH: ABO/RH(D): A POS

## 2011-12-03 NOTE — ED Notes (Signed)
Reviewed labs with pateint and spouse

## 2011-12-03 NOTE — Discharge Instructions (Signed)
Bloody Stools Bloody stools means there is blood in your poop (stool). It is a sign that there is a problem somewhere in the digestive system. It is important for your doctor to find the cause of your bleeding, so the problem can be treated.  HOME CARE  Only take medicine as told by your doctor.   Eat foods with fiber (prunes, bran cereals).   Drink enough fluids to keep your pee (urine) clear or pale yellow.   Sit in warm water (sitz bath) for 10 to 15 minutes as told by your doctor.   Know how to take your medicines (enemas, suppositories) if advised by your doctor.   Watch for signs that you are getting better or getting worse.  GET HELP RIGHT AWAY IF:   You are not getting better.   You start to get better but then get worse again.   You have new problems.   You have severe bleeding from the place where poop comes out (rectum) that does not stop.   You throw up (vomit) blood.   You feel weak or pass out (faint).   You have a fever.  MAKE SURE YOU:   Understand these instructions.   Will watch your condition.   Will get help right away if you are not doing well or get worse.  Document Released: 09/08/2009 Document Revised: 06/02/2011 Document Reviewed: 02/05/2011 [facility] Patient Information 2012 [place], [place]. Today, your blood work is normal.  Your hemoglobin and hematocrit do not show any change since her last readings.  Please  make an appointment with Dr. Dickie La for followup,  if you develop more bloody stools or become dizzy or develops vomiting return to the emergency room for further evaluation

## 2011-12-06 ENCOUNTER — Emergency Department (HOSPITAL_COMMUNITY): Payer: Medicare Other

## 2011-12-06 ENCOUNTER — Encounter (HOSPITAL_COMMUNITY): Payer: Self-pay | Admitting: General Practice

## 2011-12-06 ENCOUNTER — Ambulatory Visit: Payer: Federal, State, Local not specified - PPO | Admitting: Cardiology

## 2011-12-06 ENCOUNTER — Other Ambulatory Visit: Payer: Self-pay

## 2011-12-06 ENCOUNTER — Inpatient Hospital Stay (HOSPITAL_COMMUNITY)
Admission: EM | Admit: 2011-12-06 | Discharge: 2011-12-09 | DRG: 100 | Disposition: A | Discharge status: Home / Self Care | Payer: Medicare Other | Attending: Internal Medicine | Admitting: Internal Medicine

## 2011-12-06 DIAGNOSIS — I6529 Occlusion and stenosis of unspecified carotid artery: Secondary | ICD-10-CM

## 2011-12-06 DIAGNOSIS — R5381 Other malaise: Secondary | ICD-10-CM

## 2011-12-06 DIAGNOSIS — I714 Abdominal aortic aneurysm, without rupture, unspecified: Secondary | ICD-10-CM

## 2011-12-06 DIAGNOSIS — Z8679 Personal history of other diseases of the circulatory system: Secondary | ICD-10-CM

## 2011-12-06 DIAGNOSIS — Z87891 Personal history of nicotine dependence: Secondary | ICD-10-CM

## 2011-12-06 DIAGNOSIS — Z951 Presence of aortocoronary bypass graft: Secondary | ICD-10-CM

## 2011-12-06 DIAGNOSIS — K219 Gastro-esophageal reflux disease without esophagitis: Secondary | ICD-10-CM

## 2011-12-06 DIAGNOSIS — Z860100 Personal history of colon polyps, unspecified: Secondary | ICD-10-CM

## 2011-12-06 DIAGNOSIS — Z79899 Other long term (current) drug therapy: Secondary | ICD-10-CM

## 2011-12-06 DIAGNOSIS — Z9119 Patient's noncompliance with other medical treatment and regimen: Secondary | ICD-10-CM

## 2011-12-06 DIAGNOSIS — J449 Chronic obstructive pulmonary disease, unspecified: Secondary | ICD-10-CM | POA: Diagnosis present

## 2011-12-06 DIAGNOSIS — J189 Pneumonia, unspecified organism: Secondary | ICD-10-CM

## 2011-12-06 DIAGNOSIS — R197 Diarrhea, unspecified: Secondary | ICD-10-CM

## 2011-12-06 DIAGNOSIS — S0990XA Unspecified injury of head, initial encounter: Secondary | ICD-10-CM

## 2011-12-06 DIAGNOSIS — R5383 Other fatigue: Secondary | ICD-10-CM

## 2011-12-06 DIAGNOSIS — Z7902 Long term (current) use of antithrombotics/antiplatelets: Secondary | ICD-10-CM

## 2011-12-06 DIAGNOSIS — I2510 Atherosclerotic heart disease of native coronary artery without angina pectoris: Secondary | ICD-10-CM

## 2011-12-06 DIAGNOSIS — R404 Transient alteration of awareness: Secondary | ICD-10-CM

## 2011-12-06 DIAGNOSIS — D649 Anemia, unspecified: Secondary | ICD-10-CM

## 2011-12-06 DIAGNOSIS — K7689 Other specified diseases of liver: Secondary | ICD-10-CM

## 2011-12-06 DIAGNOSIS — Z87440 Personal history of urinary (tract) infections: Secondary | ICD-10-CM

## 2011-12-06 DIAGNOSIS — I10 Essential (primary) hypertension: Secondary | ICD-10-CM

## 2011-12-06 DIAGNOSIS — J4489 Other specified chronic obstructive pulmonary disease: Secondary | ICD-10-CM | POA: Diagnosis present

## 2011-12-06 DIAGNOSIS — G40909 Epilepsy, unspecified, not intractable, without status epilepticus: Principal | ICD-10-CM

## 2011-12-06 DIAGNOSIS — R4182 Altered mental status, unspecified: Secondary | ICD-10-CM

## 2011-12-06 DIAGNOSIS — F1210 Cannabis abuse, uncomplicated: Secondary | ICD-10-CM | POA: Diagnosis present

## 2011-12-06 DIAGNOSIS — F3289 Other specified depressive episodes: Secondary | ICD-10-CM

## 2011-12-06 DIAGNOSIS — E1169 Type 2 diabetes mellitus with other specified complication: Secondary | ICD-10-CM | POA: Diagnosis present

## 2011-12-06 DIAGNOSIS — R269 Unspecified abnormalities of gait and mobility: Secondary | ICD-10-CM

## 2011-12-06 DIAGNOSIS — R4701 Aphasia: Secondary | ICD-10-CM | POA: Diagnosis present

## 2011-12-06 DIAGNOSIS — D696 Thrombocytopenia, unspecified: Secondary | ICD-10-CM

## 2011-12-06 DIAGNOSIS — I6350 Cerebral infarction due to unspecified occlusion or stenosis of unspecified cerebral artery: Secondary | ICD-10-CM

## 2011-12-06 DIAGNOSIS — E119 Type 2 diabetes mellitus without complications: Secondary | ICD-10-CM

## 2011-12-06 DIAGNOSIS — Z8673 Personal history of transient ischemic attack (TIA), and cerebral infarction without residual deficits: Secondary | ICD-10-CM

## 2011-12-06 DIAGNOSIS — E785 Hyperlipidemia, unspecified: Secondary | ICD-10-CM

## 2011-12-06 DIAGNOSIS — R972 Elevated prostate specific antigen [PSA]: Secondary | ICD-10-CM

## 2011-12-06 DIAGNOSIS — E538 Deficiency of other specified B group vitamins: Secondary | ICD-10-CM | POA: Diagnosis present

## 2011-12-06 DIAGNOSIS — I959 Hypotension, unspecified: Secondary | ICD-10-CM | POA: Diagnosis not present

## 2011-12-06 DIAGNOSIS — F4310 Post-traumatic stress disorder, unspecified: Secondary | ICD-10-CM | POA: Diagnosis present

## 2011-12-06 DIAGNOSIS — F329 Major depressive disorder, single episode, unspecified: Secondary | ICD-10-CM | POA: Diagnosis present

## 2011-12-06 DIAGNOSIS — J690 Pneumonitis due to inhalation of food and vomit: Secondary | ICD-10-CM | POA: Diagnosis present

## 2011-12-06 DIAGNOSIS — D500 Iron deficiency anemia secondary to blood loss (chronic): Secondary | ICD-10-CM

## 2011-12-06 DIAGNOSIS — Z85820 Personal history of malignant melanoma of skin: Secondary | ICD-10-CM

## 2011-12-06 DIAGNOSIS — Z91199 Patient's noncompliance with other medical treatment and regimen due to unspecified reason: Secondary | ICD-10-CM

## 2011-12-06 DIAGNOSIS — E669 Obesity, unspecified: Secondary | ICD-10-CM | POA: Diagnosis present

## 2011-12-06 HISTORY — DX: Encounter for other specified aftercare: Z51.89

## 2011-12-06 HISTORY — DX: Anxiety disorder, unspecified: F41.9

## 2011-12-06 HISTORY — DX: Headache: R51

## 2011-12-06 HISTORY — DX: Adverse effect of unspecified anesthetic, initial encounter: T41.45XA

## 2011-12-06 HISTORY — DX: Unspecified osteoarthritis, unspecified site: M19.90

## 2011-12-06 HISTORY — DX: Other complications of anesthesia, initial encounter: T88.59XA

## 2011-12-06 HISTORY — DX: Pneumonia, unspecified organism: J18.9

## 2011-12-06 HISTORY — DX: Reserved for inherently not codable concepts without codable children: IMO0001

## 2011-12-06 HISTORY — DX: Personal history of other diseases of the respiratory system: Z87.09

## 2011-12-06 HISTORY — DX: Angina pectoris, unspecified: I20.9

## 2011-12-06 LAB — URINALYSIS, ROUTINE W REFLEX MICROSCOPIC
Hgb urine dipstick: NEGATIVE
Leukocytes, UA: NEGATIVE
Nitrite: NEGATIVE
Protein, ur: NEGATIVE mg/dL
Specific Gravity, Urine: 1.011 (ref 1.005–1.030)
Urobilinogen, UA: 0.2 mg/dL (ref 0.0–1.0)

## 2011-12-06 LAB — DIFFERENTIAL
Basophils Absolute: 0 10*3/uL (ref 0.0–0.1)
Basophils Relative: 0 % (ref 0–1)
Eosinophils Absolute: 0.1 10*3/uL (ref 0.0–0.7)
Eosinophils Relative: 1 % (ref 0–5)
Monocytes Absolute: 1.2 10*3/uL — ABNORMAL HIGH (ref 0.1–1.0)
Monocytes Relative: 11 % (ref 3–12)

## 2011-12-06 LAB — CARDIAC PANEL(CRET KIN+CKTOT+MB+TROPI)
CK, MB: 2.7 ng/mL (ref 0.3–4.0)
Total CK: 311 U/L — ABNORMAL HIGH (ref 7–232)
Troponin I: <0.3 ng/mL (ref <0.30)
Troponin I: <0.3 ng/mL (ref <0.30)

## 2011-12-06 LAB — CBC
HCT: 37.4 % — ABNORMAL LOW (ref 39.0–52.0)
Hemoglobin: 12.5 g/dL — ABNORMAL LOW (ref 13.0–17.0)
MCH: 30.7 pg (ref 26.0–34.0)
MCHC: 33.4 g/dL (ref 30.0–36.0)
RDW: 13.1 % (ref 11.5–15.5)

## 2011-12-06 LAB — POCT I-STAT TROPONIN I: Troponin i, poc: 0 ng/mL (ref 0.00–0.08)

## 2011-12-06 LAB — COMPREHENSIVE METABOLIC PANEL
AST: 11 U/L (ref 0–37)
Albumin: 3.8 g/dL (ref 3.5–5.2)
BUN: 17 mg/dL (ref 6–23)
Calcium: 9.6 mg/dL (ref 8.4–10.5)
Chloride: 99 mEq/L (ref 96–112)
Creatinine, Ser: 1.43 mg/dL — ABNORMAL HIGH (ref 0.50–1.35)
Total Bilirubin: 0.2 mg/dL — ABNORMAL LOW (ref 0.3–1.2)
Total Protein: 6.5 g/dL (ref 6.0–8.3)

## 2011-12-06 LAB — MAGNESIUM: Magnesium: <0.2 mg/dL — CL (ref 1.5–2.5)

## 2011-12-06 LAB — RAPID URINE DRUG SCREEN, HOSP PERFORMED
Barbiturates: NOT DETECTED
Tetrahydrocannabinol: NOT DETECTED

## 2011-12-06 LAB — AMMONIA: Ammonia: 27 umol/L (ref 11–60)

## 2011-12-06 LAB — HEPATIC FUNCTION PANEL
Bilirubin, Direct: <0.1 mg/dL (ref 0.0–0.3)
Total Protein: 6.5 g/dL (ref 6.0–8.3)

## 2011-12-06 LAB — PRO B NATRIURETIC PEPTIDE: Pro B Natriuretic peptide (BNP): 160.8 pg/mL — ABNORMAL HIGH (ref 0–125)

## 2011-12-06 MED ORDER — METOPROLOL TARTRATE 25 MG PO TABS
25.0000 mg | ORAL_TABLET | Freq: Two times a day (BID) | ORAL | Status: DC
  Administered 2011-12-06 – 2011-12-07 (×3): 25 mg via ORAL
  Filled 2011-12-06 (×5): qty 1

## 2011-12-06 MED ORDER — ENOXAPARIN SODIUM 40 MG/0.4ML ~~LOC~~ SOLN
40.0000 mg | SUBCUTANEOUS | Status: DC
  Administered 2011-12-07 – 2011-12-08 (×2): 40 mg via SUBCUTANEOUS
  Filled 2011-12-06 (×4): qty 0.4

## 2011-12-06 MED ORDER — ASPIRIN EC 81 MG PO TBEC
81.0000 mg | DELAYED_RELEASE_TABLET | Freq: Every day | ORAL | Status: DC
  Administered 2011-12-06 – 2011-12-07 (×2): 81 mg via ORAL
  Filled 2011-12-06 (×2): qty 1

## 2011-12-06 MED ORDER — LEVALBUTEROL HCL 0.63 MG/3ML IN NEBU
0.6300 mg | INHALATION_SOLUTION | Freq: Four times a day (QID) | RESPIRATORY_TRACT | Status: DC
  Administered 2011-12-06: 0.63 mg via RESPIRATORY_TRACT
  Filled 2011-12-06 (×3): qty 3

## 2011-12-06 MED ORDER — SODIUM CHLORIDE 0.9 % IJ SOLN
3.0000 mL | Freq: Two times a day (BID) | INTRAMUSCULAR | Status: DC
  Administered 2011-12-06 – 2011-12-09 (×5): 3 mL via INTRAVENOUS

## 2011-12-06 MED ORDER — LEVALBUTEROL HCL 0.63 MG/3ML IN NEBU
0.6300 mg | INHALATION_SOLUTION | Freq: Four times a day (QID) | RESPIRATORY_TRACT | Status: DC | PRN
  Filled 2011-12-06: qty 3

## 2011-12-06 MED ORDER — ASPIRIN 81 MG PO TABS: 81.0000 mg | ORAL_TABLET | Freq: Every day | ORAL | Status: DC

## 2011-12-06 MED ORDER — QUETIAPINE FUMARATE 100 MG PO TABS
100.0000 mg | ORAL_TABLET | Freq: Every day | ORAL | Status: DC
  Administered 2011-12-06 – 2011-12-08 (×3): 100 mg via ORAL
  Filled 2011-12-06 (×4): qty 1

## 2011-12-06 MED ORDER — ACETAMINOPHEN 325 MG PO TABS
650.0000 mg | ORAL_TABLET | Freq: Four times a day (QID) | ORAL | Status: DC | PRN
  Administered 2011-12-06 – 2011-12-08 (×5): 650 mg via ORAL
  Filled 2011-12-06 (×5): qty 2

## 2011-12-06 MED ORDER — VENLAFAXINE HCL 75 MG PO TABS: 75.0000 mg | ORAL_TABLET | ORAL | Status: DC

## 2011-12-06 MED ORDER — SIMVASTATIN 20 MG PO TABS: 20.0000 mg | ORAL_TABLET | Freq: Every evening | ORAL | Status: DC

## 2011-12-06 MED ORDER — ENOXAPARIN SODIUM 40 MG/0.4ML ~~LOC~~ SOLN
40.0000 mg | Freq: Once | SUBCUTANEOUS | Status: DC
  Filled 2011-12-06: qty 0.4

## 2011-12-06 MED ORDER — TAMSULOSIN HCL 0.4 MG PO CAPS
0.4000 mg | ORAL_CAPSULE | Freq: Two times a day (BID) | ORAL | Status: DC
  Administered 2011-12-06 – 2011-12-09 (×6): 0.4 mg via ORAL
  Filled 2011-12-06 (×7): qty 1

## 2011-12-06 MED ORDER — ONDANSETRON HCL 4 MG PO TABS: 4.0000 mg | ORAL_TABLET | Freq: Four times a day (QID) | ORAL | Status: DC | PRN

## 2011-12-06 MED ORDER — CLOPIDOGREL BISULFATE 75 MG PO TABS
75.0000 mg | ORAL_TABLET | Freq: Every day | ORAL | Status: DC
  Administered 2011-12-07: 75 mg via ORAL
  Filled 2011-12-06 (×2): qty 1

## 2011-12-06 MED ORDER — VENLAFAXINE HCL 75 MG PO TABS
150.0000 mg | ORAL_TABLET | Freq: Every day | ORAL | Status: DC
  Administered 2011-12-07 – 2011-12-09 (×3): 150 mg via ORAL
  Filled 2011-12-06 (×3): qty 2

## 2011-12-06 MED ORDER — VENLAFAXINE HCL 75 MG PO TABS
75.0000 mg | ORAL_TABLET | Freq: Every day | ORAL | Status: DC
  Administered 2011-12-06 – 2011-12-08 (×3): 75 mg via ORAL
  Filled 2011-12-06 (×4): qty 1

## 2011-12-06 MED ORDER — ONDANSETRON HCL 4 MG/2ML IJ SOLN: 4.0000 mg | Freq: Four times a day (QID) | INTRAMUSCULAR | Status: DC | PRN

## 2011-12-06 MED ORDER — SODIUM CHLORIDE 0.9 % IV BOLUS (SEPSIS)
500.0000 mL | Freq: Once | INTRAVENOUS | Status: AC
  Administered 2011-12-06: 500 mL via INTRAVENOUS

## 2011-12-06 MED ORDER — ACETAMINOPHEN 650 MG RE SUPP: 650.0000 mg | Freq: Four times a day (QID) | RECTAL | Status: DC | PRN

## 2011-12-06 MED ORDER — ATORVASTATIN CALCIUM 40 MG PO TABS
40.0000 mg | ORAL_TABLET | Freq: Every day | ORAL | Status: DC
  Administered 2011-12-07 – 2011-12-08 (×2): 40 mg via ORAL
  Filled 2011-12-06 (×4): qty 1

## 2011-12-06 MED ORDER — ALBUTEROL SULFATE (5 MG/ML) 0.5% IN NEBU
2.5000 mg | INHALATION_SOLUTION | Freq: Once | RESPIRATORY_TRACT | Status: DC
  Filled 2011-12-06: qty 0.5

## 2011-12-06 NOTE — ED Notes (Signed)
Family at bedside. 

## 2011-12-06 NOTE — ED Notes (Signed)
Pharmacy called regarding 1600 medications.

## 2011-12-06 NOTE — ED Provider Notes (Signed)
66 year old male was brought in by family member because he was more confused than normal. She found him on the floor. He has a history of posttraumatic stress disorder and has tardive dyskinesia secondary to some medications he is been taking in the past. Today, his shaking seems to be worse and he is much more confused. The family member states that he does not drink. They're not aware of any fever. It is not known if he has been compliant with his medications. Patient is very confused and is not able to give any history. On exam, he has moderate tremor but no focal neurologic findings. There is trace pretibial edema. Workup is done to evaluate for causes of confusion including CT of the head, urine tox screen, alcohol level and chemistry panel.  Dione Booze, MD 12/06/11 1248

## 2011-12-06 NOTE — ED Provider Notes (Signed)
History     CSN: [id number]  Arrival date & time 12/06/11  1133   First MD Initiated Contact with Patient 12/06/11 1150      Chief Complaint  Patient presents with  . Weakness  . Dizziness    (Consider location/radiation/quality/duration/timing/severity/associated sxs/prior treatment) Patient is a 66 y.o. male presenting with weakness. The history is provided by the patient and the spouse. No language interpreter was used.  Weakness The primary symptoms include altered mental status and dizziness. Primary symptoms do not include headaches, syncope, fever, nausea or vomiting.  Dizziness also occurs with weakness. Dizziness does not occur with nausea or vomiting.  Additional symptoms include weakness. Medical issues also include cerebral vascular accident. Medical issues do not include seizures, cancer or diabetes. Workup history includes CT scan.   Spouse reports that she found her husband on the floor this morning and with an altered level of consciousness. He has a past medical history of PTSD CABG stroke hypertension and a low heart rate. He goes to the [facility] in [place]. He sees Dr. Kirtland Bouchard  for his heart problems. He had an appointment with Dr. Jaclynn Major today but he is going to miss it. He was at [facility] long Friday for rectal bleeding in May but that he has hemorrhoids. Wife has been staying at her parents house and taking care of them for the past 2 days. Last time she talks it is her husband was last night in a normal state. She found him this morning he was confused and on the ground. 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 He is on Effexor or Seroquel Vicodin Depakote and Klonopin for his psych problems. Patient has been giving his own medicines in the last 2 days. Wife unsure if he has taken the wrong medicines are too much. We'll do a workup for altered LOC.  Past Medical History  Diagnosis Date  . HLD (hyperlipidemia)   . HTN (hypertension)   . History of  TIAs   . GERD (gastroesophageal reflux disease)   . Depression   . PTSD (post-traumatic stress disorder)   . CVA (cerebral vascular accident)   . History of colonic polyps   . Vitamin B12 deficiency   . Chronic leg pain   . Melanoma     L arm  . Anemia     s/p transfusions  . DM2 (diabetes mellitus, type 2)   . COPD (chronic obstructive pulmonary disease)   . CAD (coronary artery disease)   . Fatty liver disease, nonalcoholic   . Obesity   . Carotid stenosis     Past Surgical History  Procedure Date  . Cholecystectomy   . Coronary artery bypass graft     LIMA to LAD, SVG to OM, SVG to PDA 12/30/10  . Ercp   . Leg surgery     right    Family History  Problem Relation Age of Onset  . Hypertension      History  Substance Use Topics  . Smoking status: Former Games developer  . Smokeless tobacco: Not on file  . Alcohol Use: No      Review of Systems  Constitutional: Negative for fever.  Cardiovascular: Negative for syncope.  Gastrointestinal: Negative for nausea and vomiting.  Neurological: Positive for dizziness and weakness. Negative for headaches.  Psychiatric/Behavioral: Positive for altered mental status.  All other systems reviewed and are negative.    Allergies  Review of patient's allergies indicates no known allergies.  Home Medications   Current  Outpatient Rx  Name Route Sig Dispense Refill  . CLONAZEPAM 0.5 MG PO TABS Oral Take 0.5 mg by mouth 2 (two) times daily as needed.      . CLOPIDOGREL BISULFATE 75 MG PO TABS Oral Take 75 mg by mouth daily.      . CYANOCOBALAMIN IJ Injection Inject 1,000 mg as directed every 30 (thirty) days. On the 2nd of the month    . DIVALPROEX SODIUM 250 MG PO TBEC Oral Take 500 mg by mouth 2 (two) times daily.     Marland Kitchen HYDROCODONE-ACETAMINOPHEN 5-500 MG PO TABS Oral Take 1 tablet by mouth every 6 (six) hours as needed. For pain    . LACTOBACILLUS PO Oral Take 2 capsules by mouth 2 (two) times daily.    Marland Kitchen METOPROLOL TARTRATE 50  MG PO TABS Oral Take 0.5 tablets (25 mg total) by mouth 2 (two) times daily. 180 tablet 3  . QUETIAPINE FUMARATE 200 MG PO TABS Oral Take 100 mg by mouth at bedtime.      Marland Kitchen SIMVASTATIN 80 MG PO TABS Oral Take 40 mg by mouth at bedtime.    . TAMSULOSIN HCL 0.4 MG PO CAPS Oral Take 0.4 mg by mouth 2 (two) times daily.     . TRAZODONE HCL 50 MG PO TABS Oral Take 50 mg by mouth at bedtime.      . VENLAFAXINE HCL 75 MG PO TABS Oral Take 75 mg by mouth as directed. 2 po qam and 1 po qhs       BP 151/111  Pulse 85  Temp(Src) 99 F (37.2 C) (Oral)  Resp 16  SpO2 96%  Physical Exam  Nursing note and vitals reviewed. Constitutional: He appears well-developed and well-nourished.  HENT:  Head: Normocephalic and atraumatic.  Eyes: Pupils are equal, round, and reactive to light.  Neck: Neck supple.  Cardiovascular: Normal rate and regular rhythm.  Exam reveals no gallop and no friction rub.   No murmur heard. Pulmonary/Chest: Effort normal and breath sounds normal. No respiratory distress. He has no wheezes. He has no rales. He exhibits no tenderness.  Abdominal: Soft. He exhibits no distension. There is no tenderness. There is no rebound and no guarding.  Musculoskeletal: Normal range of motion.  Neurological: He is alert. He displays normal reflexes. No cranial nerve deficit. Coordination abnormal.       Receptive and expressive aphasia follows commands intermittantly.  ? L fov decreased.  Skin: Skin is warm and dry.  Psychiatric: He has a normal mood and affect.    ED Course  Procedures (including critical care time)  Labs Reviewed  CBC - Abnormal; Notable for the following:    WBC 11.0 (*)    RBC 4.07 (*)    Hemoglobin 12.5 (*)    HCT 37.4 (*)    All other components within normal limits  DIFFERENTIAL - Abnormal; Notable for the following:    Neutrophils Relative 78 (*)    Neutro Abs 8.6 (*)    Lymphocytes Relative 10 (*)    Monocytes Absolute 1.2 (*)    All other components  within normal limits  COMPREHENSIVE METABOLIC PANEL - Abnormal; Notable for the following:    Glucose, Bld 128 (*)    Creatinine, Ser 1.43 (*)    Total Bilirubin 0.2 (*)    GFR calc non Af Amer 49 (*)    GFR calc Af Amer 57 (*)    All other components within normal limits  POCT I-STAT TROPONIN I  URINALYSIS, ROUTINE W REFLEX MICROSCOPIC  ETHANOL  URINE RAPID DRUG SCREEN (HOSP PERFORMED)  AMMONIA   No results found.   No diagnosis found.    MDM   Date: 12/06/2011  Rate: 77    Rhythm: normal sinus rhythm  QRS Axis: normal  Intervals: normal  ST/T Wave abnormalities: normal  Conduction Disutrbances:first-degree A-V block   Narrative Interpretation:   Old EKG Reviewed: unchanged  66 yo found down by his wife this morning around 8 AM with altered loc. Seems to have expressive and receptive aphasia. Follows commands intermediately. MAE=  pupils are equal and reactive. Also c/o dizziness and he is having a short attention span.  pmh of CABG, stroke, hypertension, bradycardia, hyperlipidemia. Will admit team one hospitalists.  Labs Reviewed  CBC - Abnormal; Notable for the following:    WBC 11.0 (*)    RBC 4.07 (*)    Hemoglobin 12.5 (*)    HCT 37.4 (*)    All other components within normal limits  DIFFERENTIAL - Abnormal; Notable for the following:    Neutrophils Relative 78 (*)    Neutro Abs 8.6 (*)    Lymphocytes Relative 10 (*)    Monocytes Absolute 1.2 (*)    All other components within normal limits  COMPREHENSIVE METABOLIC PANEL - Abnormal; Notable for the following:    Glucose, Bld 128 (*)    Creatinine, Ser 1.43 (*)    Total Bilirubin 0.2 (*)    GFR calc non Af Amer 49 (*)    GFR calc Af Amer 57 (*)    All other components within normal limits  URINE RAPID DRUG SCREEN (HOSP PERFORMED) - Abnormal; Notable for the following:    Opiates POSITIVE (*)    All other components within normal limits  URINALYSIS, ROUTINE W REFLEX MICROSCOPIC  POCT I-STAT TROPONIN I    ETHANOL  AMMONIA  BLOOD GAS, ARTERIAL           Jethro Bastos, NP 12/06/11 1524

## 2011-12-06 NOTE — ED Notes (Signed)
Wife states that she normally will set out a weeks worth of medications that she trusts pt to take when due. Pt has been known to not take medications properly in the past per wife. She states that she did not check his medications this morning before coming to the hospital

## 2011-12-06 NOTE — ED Notes (Signed)
Per ems- pt coming from home where wife found pt lying on his back on the ground, pt reported feeling dizzy.  Ems report pt is slightly more altered than normal, periodic confusion. Pt was last seen on Friday and states was "normal" then.  Pt has been stumbling and having frequent dizziness.

## 2011-12-06 NOTE — H&P (Signed)
[name] MRN: [id number] DOB/AGE: Feb 02, 1946 66 y.o. Primary Care Physician:Yvonne Lowne, DO, DO Admit date: 12/06/2011 Chief Complaint: Weakness,.  Dizziness   HPI: 66 y.o. male presenting with weakness. He was found down by his wife this morning around 8 AM with altered loc. He was found laying on the floor and   have expressive and receptive aphasia. He was initially not following any commands when he presented to the ED. Also c/o dizziness and he is having a short attention span. Denied any history of seizures. The history is provided by the patient and the spouse.  The primary symptoms include altered mental status and dizziness.  Dizziness also occurs with weakness. Dizziness does not occur with nausea or vomiting. Denied any chest pain any shortness of breath.  Workup history includes CT scan.  Spouse reports that she found her husband on the floor this morning and with an altered level of consciousness. He has a past medical history of PTSD CABG stroke hypertension and a low heart rate. He goes to the [facility] in [place]. He sees Dr. Kirtland Bouchard for his heart problems. He had an appointment with Dr. Jaclynn Major today but he is going to miss it. He was at [facility] long Friday for rectal bleeding in May but that he has hemorrhoids. Wife has been staying at her parents house and taking care of them for the past 2 days. Last time she talks it is her husband was last night in a normal state. She found him this morning he was confused and on the ground. 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 He is on Effexor or Seroquel Vicodin Depakote and Klonopin for his psych problems. Patient has been giving his own medicines in the last 2 days. Wife unsure if he has taken the wrong medicines are too much. We'll do a workup for altered LOC.   Past Medical History  Diagnosis Date  . HLD (hyperlipidemia)   . HTN (hypertension)   . History of TIAs   . GERD (gastroesophageal reflux disease)   .  Depression   . PTSD (post-traumatic stress disorder)   . CVA (cerebral vascular accident)   . History of colonic polyps   . Vitamin B12 deficiency   . Chronic leg pain   . Melanoma     L arm  . Anemia     s/p transfusions  . DM2 (diabetes mellitus, type 2)   . COPD (chronic obstructive pulmonary disease)   . CAD (coronary artery disease)   . Fatty liver disease, nonalcoholic   . Obesity   . Carotid stenosis     Past Surgical History  Procedure Date  . Cholecystectomy   . Coronary artery bypass graft     LIMA to LAD, SVG to OM, SVG to PDA 12/30/10  . Ercp   . Leg surgery     right    Prior to Admission medications   Medication Sig Start Date End Date Taking? Authorizing Provider  clonazePAM (KLONOPIN) 0.5 MG tablet Take 0.5 mg by mouth 2 (two) times daily as needed.     Yes Historical Provider, MD  clopidogrel (PLAVIX) 75 MG tablet Take 75 mg by mouth daily.     Yes Historical Provider, MD  CYANOCOBALAMIN IJ Inject 1,000 mg as directed every 30 (thirty) days. On the 2nd of the month   Yes Historical Provider, MD  divalproex (DEPAKOTE) 250 MG EC tablet Take 500 mg by mouth 2 (two) times daily.    Yes Historical  Provider, MD  HYDROcodone-acetaminophen (VICODIN) 5-500 MG per tablet Take 1 tablet by mouth every 6 (six) hours as needed. For pain   Yes Historical Provider, MD  LACTOBACILLUS PO Take 2 capsules by mouth 2 (two) times daily.   Yes Historical Provider, MD  metoprolol (LOPRESSOR) 50 MG tablet Take 0.5 tablets (25 mg total) by mouth 2 (two) times daily. 03/05/11  Yes Rollene Rotunda, MD  QUEtiapine (SEROQUEL) 200 MG tablet Take 100 mg by mouth at bedtime.     Yes Historical Provider, MD  simvastatin (ZOCOR) 80 MG tablet Take 40 mg by mouth at bedtime.   Yes Historical Provider, MD  Tamsulosin HCl (FLOMAX) 0.4 MG CAPS Take 0.4 mg by mouth 2 (two) times daily.    Yes Historical Provider, MD  traZODone (DESYREL) 50 MG tablet Take 50 mg by mouth at bedtime.     Yes Historical  Provider, MD  venlafaxine (EFFEXOR) 75 MG tablet Take 75 mg by mouth as directed. 2 po qam and 1 po qhs    Yes Historical Provider, MD    Allergies: No Known Allergies  Family History  Problem Relation Age of Onset  . Hypertension      Social History:  reports that he has quit smoking. He does not have any smokeless tobacco history on file. He reports that he does not drink alcohol. His drug history not on file.   Date: 12/06/2011  Rate: 77  Rhythm: normal sinus rhythm  QRS Axis: normal  Intervals: normal  ST/T Wave abnormalities: normal  Conduction Disutrbances:first-degree A-V block  Narrative Interpretation:  Old EKG Reviewed: unchanged       UJW:[id number]: Negative for fever.  Cardiovascular: Negative for syncope.  Gastrointestinal: Negative for nausea and vomiting.  Neurological: Positive for dizziness and weakness. Negative for headaches.  Psychiatric/Behavioral: Positive for altered mental status.  All other systems reviewed and are negative.  PHYSICAL EXAM: Blood pressure 156/80, pulse 76, temperature 99 F (37.2 C), temperature source Oral, resp. rate 19, SpO2 100.00%. Constitutional: He appears well-developed and well-nourished.  HENT:  Head: Normocephalic and atraumatic.  Eyes: Pupils are equal, round, and reactive to light.  Neck: Neck supple.  Cardiovascular: Normal rate and regular rhythm. Exam reveals no gallop and no friction rub.  No murmur heard.  Pulmonary/Chest: Effort normal and breath sounds normal. No respiratory distress. He has no wheezes. He has no rales. He exhibits no tenderness.  Abdominal: Soft. He exhibits no distension. There is no tenderness. There is no rebound and no guarding.  Musculoskeletal: Normal range of motion.  Neurological: He is alert. He displays normal reflexes. No cranial nerve deficit. Coordination abnormal.  Receptive and expressive aphasia follows commands intermittantly. ? L fov decreased.  Skin: Skin is warm  and dry.  Psychiatric: He has a normal mood and affect.    No results found for this or any previous visit (from the past 240 hour(s)).   Results for orders placed during the hospital encounter of 12/06/11 (from the past 48 hour(s))  CBC     Status: Abnormal   Collection Time   12/06/11 11:57 AM      Component Value Range Comment   WBC 11.0 (*) 4.0 - 10.5 (K/uL)    RBC 4.07 (*) 4.22 - 5.81 (MIL/uL)    Hemoglobin 12.5 (*) 13.0 - 17.0 (g/dL)    HCT 08.6 (*) 57.8 - 52.0 (%)    MCV 91.9  78.0 - 100.0 (fL)    MCH 30.7  26.0 - 34.0 (pg)  MCHC 33.4  30.0 - 36.0 (g/dL)    RDW 16.1  09.6 - 04.5 (%)    Platelets 167  150 - 400 (K/uL)   DIFFERENTIAL     Status: Abnormal   Collection Time   12/06/11 11:57 AM      Component Value Range Comment   Neutrophils Relative 78 (*) 43 - 77 (%)    Neutro Abs 8.6 (*) 1.7 - 7.7 (K/uL)    Lymphocytes Relative 10 (*) 12 - 46 (%)    Lymphs Abs 1.1  0.7 - 4.0 (K/uL)    Monocytes Relative 11  3 - 12 (%)    Monocytes Absolute 1.2 (*) 0.1 - 1.0 (K/uL)    Eosinophils Relative 1  0 - 5 (%)    Eosinophils Absolute 0.1  0.0 - 0.7 (K/uL)    Basophils Relative 0  0 - 1 (%)    Basophils Absolute 0.0  0.0 - 0.1 (K/uL)   COMPREHENSIVE METABOLIC PANEL     Status: Abnormal   Collection Time   12/06/11 11:57 AM      Component Value Range Comment   Sodium 136  135 - 145 (mEq/L)    Potassium 4.6  3.5 - 5.1 (mEq/L)    Chloride 99  96 - 112 (mEq/L)    CO2 25  19 - 32 (mEq/L)    Glucose, Bld 128 (*) 70 - 99 (mg/dL)    BUN 17  6 - 23 (mg/dL)    Creatinine, Ser 4.09 (*) 0.50 - 1.35 (mg/dL)    Calcium 9.6  8.4 - 10.5 (mg/dL)    Total Protein 6.5  6.0 - 8.3 (g/dL)    Albumin 3.8  3.5 - 5.2 (g/dL)    AST 11  0 - 37 (U/L)    ALT 7  0 - 53 (U/L)    Alkaline Phosphatase 43  39 - 117 (U/L)    Total Bilirubin 0.2 (*) 0.3 - 1.2 (mg/dL)    GFR calc non Af Amer 49 (*) >90 (mL/min)    GFR calc Af Amer 57 (*) >90 (mL/min)   POCT I-STAT TROPONIN I     Status: Normal   Collection  Time   12/06/11 12:04 PM      Component Value Range Comment   Troponin i, poc 0.00  0.00 - 0.08 (ng/mL)    Comment 3            URINALYSIS, ROUTINE W REFLEX MICROSCOPIC     Status: Normal   Collection Time   12/06/11  1:33 PM      Component Value Range Comment   Color, Urine YELLOW  YELLOW     APPearance CLEAR  CLEAR     Specific Gravity, Urine 1.011  1.005 - 1.030     pH 6.0  5.0 - 8.0     Glucose, UA NEGATIVE  NEGATIVE (mg/dL)    Hgb urine dipstick NEGATIVE  NEGATIVE     Bilirubin Urine NEGATIVE  NEGATIVE     Ketones, ur NEGATIVE  NEGATIVE (mg/dL)    Protein, ur NEGATIVE  NEGATIVE (mg/dL)    Urobilinogen, UA 0.2  0.0 - 1.0 (mg/dL)    Nitrite NEGATIVE  NEGATIVE     Leukocytes, UA NEGATIVE  NEGATIVE  MICROSCOPIC NOT DONE ON URINES WITH NEGATIVE PROTEIN, BLOOD, LEUKOCYTES, NITRITE, OR GLUCOSE <1000 mg/dL.  URINE RAPID DRUG SCREEN (HOSP PERFORMED)     Status: Abnormal   Collection Time   12/06/11  1:33 PM  Component Value Range Comment   Opiates POSITIVE (*) NONE DETECTED     Cocaine NONE DETECTED  NONE DETECTED     Benzodiazepines NONE DETECTED  NONE DETECTED     Amphetamines NONE DETECTED  NONE DETECTED     Tetrahydrocannabinol NONE DETECTED  NONE DETECTED     Barbiturates NONE DETECTED  NONE DETECTED    ETHANOL     Status: Normal   Collection Time   12/06/11  1:36 PM      Component Value Range Comment   Alcohol, Ethyl (B) <11  0 - 11 (mg/dL)   AMMONIA     Status: Normal   Collection Time   12/06/11  1:37 PM      Component Value Range Comment   Ammonia 27  11 - 60 (umol/L)     Dg Chest 1 View  12/06/2011  *RADIOLOGY REPORT*  Clinical Data: Confusion.  CHEST - 1 VIEW  Comparison: 01/28/2011.  Findings: Trachea is midline.  Heart size is accentuated by AP semi upright technique and low lung volumes.  Probable mild vascular crowding.  Possible air space opacification in the left lower lobe. No definite pleural fluid.  IMPRESSION: Possible air space disease in the left lower lobe.   Original Report Authenticated By: Reyes Ivan, M.D.   Ct Head Wo Contrast  12/06/2011  *RADIOLOGY REPORT*  Clinical Data: Dizziness.  Confusion.  CT HEAD WITHOUT CONTRAST  Technique:  Contiguous axial images were obtained from the base of the skull through the vertex without contrast.  Comparison: 12/27/2010  Findings: No brainstem abnormality is seen. There are old cerebellar infarctions.  There is an old infarction in the right occipital cortex.  No sign of acute infarction, mass lesion, hemorrhage, hydrocephalus or extra-axial collection.  No skull fracture.  No fluid in the sinuses, middle ears or mastoids.  IMPRESSION: Old cerebellar infarctions.  Old right occipital infarction.  No acute or reversible findings.  Original Report Authenticated By: Thomasenia Sales, M.D.   Results for orders placed during the hospital encounter of 12/06/11 (from the past 24 hour(s))  CBC     Status: Abnormal   Collection Time   12/06/11 11:57 AM      Component Value Range   WBC 11.0 (*) 4.0 - 10.5 (K/uL)   RBC 4.07 (*) 4.22 - 5.81 (MIL/uL)   Hemoglobin 12.5 (*) 13.0 - 17.0 (g/dL)   HCT 16.1 (*) 09.6 - 52.0 (%)   MCV 91.9  78.0 - 100.0 (fL)   MCH 30.7  26.0 - 34.0 (pg)   MCHC 33.4  30.0 - 36.0 (g/dL)   RDW 04.5  40.9 - 81.1 (%)   Platelets 167  150 - 400 (K/uL)  DIFFERENTIAL     Status: Abnormal   Collection Time   12/06/11 11:57 AM      Component Value Range   Neutrophils Relative 78 (*) 43 - 77 (%)   Neutro Abs 8.6 (*) 1.7 - 7.7 (K/uL)   Lymphocytes Relative 10 (*) 12 - 46 (%)   Lymphs Abs 1.1  0.7 - 4.0 (K/uL)   Monocytes Relative 11  3 - 12 (%)   Monocytes Absolute 1.2 (*) 0.1 - 1.0 (K/uL)   Eosinophils Relative 1  0 - 5 (%)   Eosinophils Absolute 0.1  0.0 - 0.7 (K/uL)   Basophils Relative 0  0 - 1 (%)   Basophils Absolute 0.0  0.0 - 0.1 (K/uL)  COMPREHENSIVE METABOLIC PANEL     Status: Abnormal  Collection Time   12/06/11 11:57 AM      Component Value Range   Sodium 136  135 - 145 (mEq/L)    Potassium 4.6  3.5 - 5.1 (mEq/L)   Chloride 99  96 - 112 (mEq/L)   CO2 25  19 - 32 (mEq/L)   Glucose, Bld 128 (*) 70 - 99 (mg/dL)   BUN 17  6 - 23 (mg/dL)   Creatinine, Ser 1.61 (*) 0.50 - 1.35 (mg/dL)   Calcium 9.6  8.4 - 09.6 (mg/dL)   Total Protein 6.5  6.0 - 8.3 (g/dL)   Albumin 3.8  3.5 - 5.2 (g/dL)   AST 11  0 - 37 (U/L)   ALT 7  0 - 53 (U/L)   Alkaline Phosphatase 43  39 - 117 (U/L)   Total Bilirubin 0.2 (*) 0.3 - 1.2 (mg/dL)   GFR calc non Af Amer 49 (*) >90 (mL/min)   GFR calc Af Amer 57 (*) >90 (mL/min)  POCT I-STAT TROPONIN I     Status: Normal   Collection Time   12/06/11 12:04 PM      Component Value Range   Troponin i, poc 0.00  0.00 - 0.08 (ng/mL)   Comment 3           URINALYSIS, ROUTINE W REFLEX MICROSCOPIC     Status: Normal   Collection Time   12/06/11  1:33 PM      Component Value Range   Color, Urine YELLOW  YELLOW    APPearance CLEAR  CLEAR    Specific Gravity, Urine 1.011  1.005 - 1.030    pH 6.0  5.0 - 8.0    Glucose, UA NEGATIVE  NEGATIVE (mg/dL)   Hgb urine dipstick NEGATIVE  NEGATIVE    Bilirubin Urine NEGATIVE  NEGATIVE    Ketones, ur NEGATIVE  NEGATIVE (mg/dL)   Protein, ur NEGATIVE  NEGATIVE (mg/dL)   Urobilinogen, UA 0.2  0.0 - 1.0 (mg/dL)   Nitrite NEGATIVE  NEGATIVE    Leukocytes, UA NEGATIVE  NEGATIVE   URINE RAPID DRUG SCREEN (HOSP PERFORMED)     Status: Abnormal   Collection Time   12/06/11  1:33 PM      Component Value Range   Opiates POSITIVE (*) NONE DETECTED    Cocaine NONE DETECTED  NONE DETECTED    Benzodiazepines NONE DETECTED  NONE DETECTED    Amphetamines NONE DETECTED  NONE DETECTED    Tetrahydrocannabinol NONE DETECTED  NONE DETECTED    Barbiturates NONE DETECTED  NONE DETECTED   ETHANOL     Status: Normal   Collection Time   12/06/11  1:36 PM      Component Value Range   Alcohol, Ethyl (B) <11  0 - 11 (mg/dL)  AMMONIA     Status: Normal   Collection Time   12/06/11  1:37 PM      Component Value Range   Ammonia 27  11 - 60  (umol/L)     Impression: #1 altered mental status workup in the ER thus far is negative CT of the head was negative (patient has a slight white count but no signs of underlying infection. Above. Workup with an MRI of the brain carotid Doppler, 2-D echo. Be secondary to polypharmacy. Check a Depakote level.      #2 hypertension Continue metoprolol  #3 depression 4 medical, Depakote, trazodone,VENLAFAXINE    #4 history of CVA continue with Plavix  #5 Diabetes check a hemoglobin A1c, start sliding scale insulin  Alyssabeth Bruster 12/06/2011, 3:44 PM

## 2011-12-06 NOTE — ED Notes (Signed)
hemoccult card placed at bedside

## 2011-12-07 ENCOUNTER — Inpatient Hospital Stay (HOSPITAL_COMMUNITY): Payer: Medicare Other

## 2011-12-07 DIAGNOSIS — J189 Pneumonia, unspecified organism: Secondary | ICD-10-CM | POA: Diagnosis present

## 2011-12-07 DIAGNOSIS — I509 Heart failure, unspecified: Secondary | ICD-10-CM

## 2011-12-07 DIAGNOSIS — R4182 Altered mental status, unspecified: Secondary | ICD-10-CM | POA: Diagnosis present

## 2011-12-07 LAB — CARDIAC PANEL(CRET KIN+CKTOT+MB+TROPI)
Relative Index: 1 (ref 0.0–2.5)
Total CK: 258 U/L — ABNORMAL HIGH (ref 7–232)

## 2011-12-07 LAB — HEMOGLOBIN A1C: Hgb A1c MFr Bld: 6 % — ABNORMAL HIGH (ref <5.7)

## 2011-12-07 LAB — MAGNESIUM: Magnesium: 3.2 mg/dL — ABNORMAL HIGH (ref 1.5–2.5)

## 2011-12-07 MED ORDER — MAGNESIUM SULFATE 40 MG/ML IJ SOLN
2.0000 g | Freq: Once | INTRAMUSCULAR | Status: AC
  Administered 2011-12-07: 2 g via INTRAVENOUS
  Filled 2011-12-07: qty 50

## 2011-12-07 MED ORDER — ASPIRIN 325 MG PO TABS
325.0000 mg | ORAL_TABLET | Freq: Every day | ORAL | Status: DC
  Administered 2011-12-08 – 2011-12-09 (×2): 325 mg via ORAL
  Filled 2011-12-07 (×3): qty 1

## 2011-12-07 MED ORDER — POLYETHYLENE GLYCOL 3350 17 G PO PACK
17.0000 g | PACK | Freq: Every day | ORAL | Status: DC
  Administered 2011-12-07 – 2011-12-08 (×2): 17 g via ORAL
  Filled 2011-12-07 (×3): qty 1

## 2011-12-07 MED ORDER — CLOPIDOGREL BISULFATE 75 MG PO TABS
75.0000 mg | ORAL_TABLET | Freq: Every day | ORAL | Status: DC
  Administered 2011-12-08 – 2011-12-09 (×2): 75 mg via ORAL
  Filled 2011-12-07 (×3): qty 1

## 2011-12-07 MED ORDER — IOHEXOL 350 MG/ML SOLN
50.0000 mL | Freq: Once | INTRAVENOUS | Status: AC | PRN
  Administered 2011-12-07: 50 mL via INTRAVENOUS

## 2011-12-07 MED ORDER — MOXIFLOXACIN HCL IN NACL 400 MG/250ML IV SOLN
400.0000 mg | INTRAVENOUS | Status: DC
  Administered 2011-12-07 – 2011-12-08 (×2): 400 mg via INTRAVENOUS
  Filled 2011-12-07 (×2): qty 250

## 2011-12-07 NOTE — ED Provider Notes (Signed)
Medical screening examination/treatment/procedure(s) were conducted as a shared visit with non-physician practitioner(s) and myself.  I personally evaluated the patient during the encounter   Alon Mazor, MD 12/07/11 0847 

## 2011-12-07 NOTE — Progress Notes (Addendum)
Agree with Stephani Police, PA assessment and plan Will order a 2-D echo with contrast to evaluate for PFO in the setting of recurrent stroke Continue Plavix and aspirin Maintain telemetry to rule out underlying A. flutter A. fib Discussed with nurse practitioner

## 2011-12-07 NOTE — Progress Notes (Addendum)
Patient ID: [name], male   DOB: [birth year], 66 y.o.   MRN: [id number] Subjective: Feels better.  Less confused.  Usually incontinent of bowel, but today feels constipated.  Objective: Weight change:   Intake/Output Summary (Last 24 hours) at 12/07/11 0851 Last data filed at 12/06/11 2200  Gross per 24 hour  Intake      3 ml  Output    505 ml  Net   -502 ml   Blood pressure 113/66, pulse 64, temperature 98.8 F (37.1 C), temperature source Oral, resp. rate 20, height 5\' 11"  (1.803 m), weight 86.3 kg (190 lb 4.1 oz), SpO2 97.00%. Filed Vitals:   12/06/11 1938 12/06/11 2000 12/06/11 2058 12/07/11 0526  BP: 176/79 176/79  113/66  Pulse: 88 88 84 64  Temp: 99.1 F (37.3 C) 99.1 F (37.3 C)  98.8 F (37.1 C)  TempSrc: Oral Oral  Oral  Resp: 18 18 19 20   Height:  5\' 11"  (1.803 m)    Weight: 86.3 kg (190 lb 4.1 oz) 86.3 kg (190 lb 4.1 oz)    SpO2: 95% 95% 94% 97%    Physical Exam: General: No acute distress, A&Ox3 Lungs: Clear to auscultation bilaterally without wheezes or crackles Cardiovascular: Regular rate and rhythm without murmur gallop or rub normal S1 and S2 Abdomen: Nontender, nondistended, soft, bowel sounds positive, no rebound, no ascites, no appreciable mass Extremities: No significant cyanosis, clubbing, or edema bilateral lower extremities  Basic Metabolic Panel:  Lab 12/06/11 4098 12/06/11 1157 12/02/11 2230  NA -- 136 139  K -- 4.6 3.8  CL -- 99 101  CO2 -- 25 31  GLUCOSE -- 128* 121*  BUN -- 17 16  CREATININE -- 1.43* 1.28  CALCIUM -- 9.6 9.0  MG <0.2* -- --  PHOS -- -- --   Liver Function Tests:  Lab 12/06/11 1651 12/06/11 1157  AST 14 11  ALT 7 7  ALKPHOS 12* 43  BILITOT 0.3 0.2*  PROT 6.5 6.5  ALBUMIN 3.8 3.8   CBC:  Lab 12/06/11 1157 12/02/11 2230  WBC 11.0* 4.9  NEUTROABS 8.6* 2.5  HGB 12.5* 12.2*  HCT 37.4* 35.4*  MCV 91.9 90.5  PLT 167 155   Cardiac Enzymes:  Lab 12/06/11 2248 12/06/11 1653 12/02/11 2230  CKTOTAL 311*  259* 46  CKMB 2.7 3.4 2.3  CKMBINDEX -- -- --  TROPONINI <0.30 <0.30 <0.30    Thyroid Function Tests:  Lab 12/06/11 1651  TSH 6.153*  T4TOTAL --  FREET4 --  T3FREE --  THYROIDAB --   Urine Drug Screen: Drugs of Abuse     Component Value Date/Time   LABOPIA POSITIVE* 12/06/2011 1333   COCAINSCRNUR NONE DETECTED 12/06/2011 1333   LABBENZ NONE DETECTED 12/06/2011 1333   AMPHETMU NONE DETECTED 12/06/2011 1333   THCU NONE DETECTED 12/06/2011 1333   LABBARB NONE DETECTED 12/06/2011 1333    Alcohol Level:  Lab 12/06/11 1336  ETH <11    Studies/Results:  Chest Xray (3/4):  IMPRESSION:  Possible air space disease in the left lower lobe.  MRI Brain 3/5:IMPRESSION:  Atrophy and chronic ischemic changes.  Probable small area of acute infarct left temporal lobe.    Scheduled Meds:   . albuterol  2.5 mg Nebulization Once  . aspirin EC  81 mg Oral Daily  . atorvastatin  40 mg Oral q1800  . clopidogrel  75 mg Oral Q breakfast  . enoxaparin  40 mg Subcutaneous Q24H  . enoxaparin (LOVENOX) injection  40  mg Subcutaneous Once  . magnesium sulfate 1 - 4 g bolus IVPB  2 g Intravenous Once  . metoprolol  25 mg Oral BID  . moxifloxacin  400 mg Intravenous Q24H  . polyethylene glycol  17 g Oral Daily  . QUEtiapine  100 mg Oral QHS  . sodium chloride  500 mL Intravenous Once  . sodium chloride  3 mL Intravenous Q12H  . Tamsulosin HCl  0.4 mg Oral BID  . venlafaxine  150 mg Oral Daily  . venlafaxine  75 mg Oral QHS  . DISCONTD: aspirin  81 mg Oral Daily  . DISCONTD: levalbuterol  0.63 mg Nebulization Q6H  . DISCONTD: simvastatin  20 mg Oral QPM  . DISCONTD: venlafaxine  75 mg Oral UD   Continuous Infusions:  PRN Meds:.acetaminophen, acetaminophen, levalbuterol, ondansetron (ZOFRAN) IV, ondansetron  Anti-infectives:  Anti-infectives     Start     Dose/Rate Route Frequency Ordered Stop   12/07/11 0945   moxifloxacin (AVELOX) IVPB 400 mg        400 mg 250 mL/hr over 60 Minutes  Intravenous Every 24 hours 12/07/11 0816            Assessment/Plan: Active Problems:  Altered mental status - Patient confused on admission.  Hallucinating over night (saw a ware wolf and a mobile home in his room).  A&O today.  CT Head negative.  Will request PT / OT evaluation. Small area of Infarct noted on MRI in left temporal lobe.  Acute Left Temporal Lobe infarct: With H/O CVA.  On plavix, started full dose ASA.  2d Echo with contrast ordered.  Carotid Dopplers pending.  Will ask neuro to review for any further recommendations or diagnostics.    Pneumonia - CAP vs Aspiration (patient was vomiting before fall).  Avelox started 3/5.   Hypomagnesemia - Will replete and recheck.   Abnormal TSH - elevated at 6.1.  Will check T3, T4 with next blood draw.   Diabetes Type II - well controlled.  Hgb A1C 6.1.         LOS: 1 day   Stephani Police 12/07/2011, 8:51 AM [phone]

## 2011-12-07 NOTE — ED Provider Notes (Signed)
Medical screening examination/treatment/procedure(s) were conducted as a shared visit with non-physician practitioner(s) and myself.  I personally evaluated the patient during the encounter.  Patient with rectal bleeding. Hemodynamically stable. Has not had an ongoing bleeding in the emergency room. Hemoglobin is normal. Abdominal exam with mild tenderness on the right side. Low clinical suspicion for surgical abdomen. Return precautions were discussed. Patient has had prior colonoscopy. Feel that further evaluation can appropriately be deferred to outpatient followup.   Raeford Razor, MD 12/07/11 2244
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
## 2011-12-07 NOTE — Progress Notes (Signed)
Utilization review completed.  

## 2011-12-08 DIAGNOSIS — G40909 Epilepsy, unspecified, not intractable, without status epilepticus: Secondary | ICD-10-CM | POA: Diagnosis present

## 2011-12-08 LAB — T4, FREE: Free T4: 1.11 ng/dL (ref 0.80–1.80)

## 2011-12-08 LAB — T3, FREE: T3, Free: 2.7 pg/mL (ref 2.3–4.2)

## 2011-12-08 LAB — CBC
HCT: 35 % — ABNORMAL LOW (ref 39.0–52.0)
Hemoglobin: 11.7 g/dL — ABNORMAL LOW (ref 13.0–17.0)
MCH: 30.8 pg (ref 26.0–34.0)
RBC: 3.8 MIL/uL — ABNORMAL LOW (ref 4.22–5.81)

## 2011-12-08 LAB — LIPID PANEL
Cholesterol: 221 mg/dL — ABNORMAL HIGH (ref 0–200)
LDL Cholesterol: 136 mg/dL — ABNORMAL HIGH (ref 0–99)
VLDL: 54 mg/dL — ABNORMAL HIGH (ref 0–40)

## 2011-12-08 LAB — BASIC METABOLIC PANEL
CO2: 28 mEq/L (ref 19–32)
Glucose, Bld: 110 mg/dL — ABNORMAL HIGH (ref 70–99)
Potassium: 5 mEq/L (ref 3.5–5.1)
Sodium: 141 mEq/L (ref 135–145)

## 2011-12-08 MED ORDER — DIVALPROEX SODIUM 500 MG PO DR TAB
500.0000 mg | DELAYED_RELEASE_TABLET | Freq: Two times a day (BID) | ORAL | Status: DC
  Administered 2011-12-08: 500 mg via ORAL
  Filled 2011-12-08 (×2): qty 1

## 2011-12-08 MED ORDER — SODIUM CHLORIDE 0.9 % IV SOLN
INTRAVENOUS | Status: DC
  Administered 2011-12-08: 10:00:00 via INTRAVENOUS

## 2011-12-08 MED ORDER — SODIUM CHLORIDE 0.9 % IV SOLN
INTRAVENOUS | Status: DC
  Administered 2011-12-08: 16:00:00 via INTRAVENOUS

## 2011-12-08 MED ORDER — DIVALPROEX SODIUM 500 MG PO DR TAB
1000.0000 mg | DELAYED_RELEASE_TABLET | Freq: Every day | ORAL | Status: DC
  Administered 2011-12-08: 1000 mg via ORAL
  Filled 2011-12-08 (×2): qty 2

## 2011-12-08 MED ORDER — MOXIFLOXACIN HCL 400 MG PO TABS
400.0000 mg | ORAL_TABLET | Freq: Every day | ORAL | Status: DC
  Administered 2011-12-08: 400 mg via ORAL
  Filled 2011-12-08 (×2): qty 1

## 2011-12-08 MED ORDER — DIVALPROEX SODIUM 500 MG PO DR TAB
500.0000 mg | DELAYED_RELEASE_TABLET | Freq: Every morning | ORAL | Status: DC
  Administered 2011-12-09: 500 mg via ORAL
  Filled 2011-12-08: qty 1
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
## 2011-12-08 NOTE — Progress Notes (Signed)
Patient ID: DEX BLAKELY, male   DOB: 1946/03/12, 66 y.o.   MRN: [id number]  Consults:  Neurology.  Dr. Lyman Speller 12/07/11  Subjective: No complaints.  Wife concerned about psychiatric issues &  Medications, and patient's noncompliance.  Objective: Weight change:   Intake/Output Summary (Last 24 hours) at 12/08/11 0817 Last data filed at 12/08/11 0507  Gross per 24 hour  Intake    360 ml  Output    645 ml  Net   -285 ml   Blood pressure 92/33, pulse 53, temperature 98 F (36.7 C), temperature source Oral, resp. rate 17, height 5\' 11"  (1.803 m), weight 86.3 kg (190 lb 4.1 oz), SpO2 97.00%. Filed Vitals:   12/07/11 0526 12/07/11 1400 12/07/11 2214 12/08/11 0506  BP: 113/66 122/71 145/57 92/33  Pulse: 64 61 65 53  Temp: 98.8 F (37.1 C) 98.3 F (36.8 C) 98.8 F (37.1 C) 98 F (36.7 C)  TempSrc: Oral Oral Oral Oral  Resp: 20 18 18 17   Height:      Weight:      SpO2: 97% 97% 98% 97%    Physical Exam: General: No acute distress, A&Ox3, continuous mouth movements. Lungs: Clear to auscultation bilaterally without wheezes or crackles Cardiovascular: Regular rate and rhythm without murmur gallop or rub normal S1 and S2 Abdomen: Nontender, nondistended, soft, bowel sounds positive, no rebound, no ascites, no appreciable mass Extremities: No significant cyanosis, clubbing, or edema bilateral lower extremities  Basic Metabolic Panel:  Lab 12/08/11 8657 12/07/11 1413 12/06/11 1157  NA 141 -- 136  K 5.0 -- 4.6  CL 104 -- 99  CO2 28 -- 25  GLUCOSE 110* -- 128*  BUN 21 -- 17  CREATININE 1.51* -- 1.43*  CALCIUM 9.6 -- 9.6  MG 2.8* 2.9* --  PHOS -- -- --   Liver Function Tests:  Lab 12/06/11 1651 12/06/11 1157  AST 14 11  ALT 7 7  ALKPHOS 12* 43  BILITOT 0.3 0.2*  PROT 6.5 6.5  ALBUMIN 3.8 3.8   CBC:  Lab 12/08/11 0450 12/06/11 1157 12/02/11 2230  WBC 6.6 11.0* --  NEUTROABS -- 8.6* 2.5  HGB 11.7* 12.5* --  HCT 35.0* 37.4* --  MCV 92.1 91.9 --  PLT 153 167 --    Cardiac Enzymes:  Lab 12/07/11 0900 12/06/11 2248 12/06/11 1653  CKTOTAL 258* 311* 259*  CKMB 2.6 2.7 3.4  CKMBINDEX -- -- --  TROPONINI <0.30 <0.30 <0.30    Thyroid Function Tests: Results for MANSFIELD, DANN (MRN [id number]) as of 12/08/2011 16:00  Ref. Range 12/08/2011 04:50  TSH Latest Range: 0.350-4.500 uIU/mL 1.568  Free T4 Latest Range: 0.80-1.80 ng/dL 8.41  T3, Free Latest Range: 2.3-4.2 pg/mL 2.7   Urine Drug Screen: Drugs of Abuse     Component Value Date/Time   LABOPIA POSITIVE* 12/06/2011 1333   COCAINSCRNUR NONE DETECTED 12/06/2011 1333   LABBENZ NONE DETECTED 12/06/2011 1333   AMPHETMU NONE DETECTED 12/06/2011 1333   THCU NONE DETECTED 12/06/2011 1333   LABBARB NONE DETECTED 12/06/2011 1333    Alcohol Level:  Lab 12/06/11 1336  ETH <11    Studies/Results:  Chest Xray (3/4):  IMPRESSION:  Possible air space disease in the left lower lobe.  MRI Brain 3/5:IMPRESSION:  Atrophy and chronic ischemic changes.  Probable small area of acute infarct left temporal lobe.  2D echo Study Conclusions  - Left ventricle: The cavity size was normal. Wall thickness was normal. Systolic function was normal. The estimated ejection fraction was  in the range of 55% to 60%. Wall motion was normal; there were no regional wall motion abnormalities. Doppler parameters are consistent with abnormal left ventricular relaxation (grade 1 diastolic Dysfunction).  - Mitral valve: Calcified annulus.     Scheduled Meds:    . albuterol  2.5 mg Nebulization Once  . aspirin  325 mg Oral Daily  . atorvastatin  40 mg Oral q1800  . clopidogrel  75 mg Oral Q breakfast  . enoxaparin  40 mg Subcutaneous Q24H  . enoxaparin (LOVENOX) injection  40 mg Subcutaneous Once  . magnesium sulfate 1 - 4 g bolus IVPB  2 g Intravenous Once  . metoprolol  25 mg Oral BID  . moxifloxacin  400 mg Intravenous Q24H  . polyethylene glycol  17 g Oral Daily  . QUEtiapine  100 mg Oral QHS  . sodium chloride  3 mL  Intravenous Q12H  . Tamsulosin HCl  0.4 mg Oral BID  . venlafaxine  150 mg Oral Daily  . venlafaxine  75 mg Oral QHS  . DISCONTD: aspirin EC  81 mg Oral Daily  . DISCONTD: clopidogrel  75 mg Oral Q breakfast   Continuous Infusions:  PRN Meds:.acetaminophen, acetaminophen, iohexol, levalbuterol, ondansetron (ZOFRAN) IV, ondansetron  Anti-infectives:  Anti-infectives     Start     Dose/Rate Route Frequency Ordered Stop   12/07/11 0945   moxifloxacin (AVELOX) IVPB 400 mg        400 mg 250 mL/hr over 60 Minutes Intravenous Every 24 hours 12/07/11 0816            Assessment/Plan: Active Problems:  Altered mental status - Patient confused on admission.  Hallucinating over night (saw a ware wolf and a mobile home in his room).  A&O today.  CT Head negative.  Will request PT / OT evaluation. Small area of Infarct noted on MRI in left temporal lobe.  Acute Left Temporal Lobe infarct versus Postictal DWI. Neurology recommends increasing Depakote ER to 500 mg in am and 1000 mg hs.  Patient on plavix prior to admission.  Will remain on plavix.  Pneumonia - CAP vs Aspiration (patient was vomiting before fall).  Avelox started 3/5.  Elevated Creatinine - 1.24 at admission, 1.51 on 3/6.  Will give gentle fluids and recheck bmet in am.  Diabetes Type II - well controlled.  Hgb A1C 6.1.  Falls - Physical Therapy recommends  OPPT for balance training.  Recreational Drug Use:  Marijuana   Disposition - patient refuses assisted living.  Will d/c to home in the care of his wife 3/7.  Medical alert service offered by case management.  Out Patient PT for balance training will be ordered.  Patient will have out patient follow up with Neurology in [place], PCP and Psych at [facility].    LOS: 2 days   Stephani Police 12/08/2011, 8:17 AM [phone] I have personally seen and examined Mr. Massie  I agree with PE/AP as above   Savvas Roper

## 2011-12-08 NOTE — Progress Notes (Signed)
   CARE MANAGEMENT NOTE 12/08/2011  Patient:  [name], [name]   Account Number:  [id number]  Date Initiated:  12/08/2011  Documentation initiated by:  Letha Cape  Subjective/Objective Assessment:   dx pna, ams  admit- lives alone.     Action/Plan:   Anticipated DC Date:  12/13/2011   Anticipated DC Plan:  HOME/SELF CARE      DC Planning Services  CM consult      Choice offered to / List presented to:             Status of service:  In process, will continue to follow Medicare Important Message given?   (If response is "NO", the following Medicare IM given date fields will be blank) Date Medicare IM given:   Date Additional Medicare IM given:    Discharge Disposition:    Per UR Regulation:    Comments:  12/08/11 Letha Cape RN, BSN [phone] patient lives alone, patient is a [facility] patient as well.  NCM will continue to follow for dc needs.

## 2011-12-08 NOTE — Progress Notes (Signed)
Patient states his PCP is [name] with [facility] 1 [phone].

## 2011-12-08 NOTE — Progress Notes (Signed)
   CARE MANAGEMENT NOTE 12/08/2011  Patient:  JADON, HARBAUGH   Account Number:  [id number]  Date Initiated:  12/08/2011  Documentation initiated by:  Letha Cape  Subjective/Objective Assessment:   dx pna, ams  admit- lives alone.  Pateint has a walker at home.     Action/Plan:   Anticipated DC Date:  12/13/2011   Anticipated DC Plan:  HOME/SELF CARE      DC Planning Services  CM consult      Choice offered to / List presented to:             Status of service:  In process, will continue to follow Medicare Important Message given?   (If response is "NO", the following Medicare IM given date fields will be blank) Date Medicare IM given:   Date Additional Medicare IM given:    Discharge Disposition:    Per UR Regulation:    Comments:  12/08/11 Letha Cape RN, BSN [phone] patient lives alone, patient is a [facility] patient as well.  NCM will continue to follow for dc needs.  Patient wife does not live with him, she keeps her mother.  Patient states he does not want to go to a snf and he does not want any HH services. I gave patient Medi Alert information. Physical therapy recs out pt, faxed referral to outpt neuro rehabilitation. Patient may be for possible dc tomorrow. Patient has medication coverage and transportation.
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
## 2011-12-09 ENCOUNTER — Inpatient Hospital Stay (HOSPITAL_COMMUNITY): Payer: Medicare Other

## 2011-12-09 DIAGNOSIS — R569 Unspecified convulsions: Secondary | ICD-10-CM

## 2011-12-09 DIAGNOSIS — G2401 Drug induced subacute dyskinesia: Secondary | ICD-10-CM | POA: Insufficient documentation

## 2011-12-09 DIAGNOSIS — Z8673 Personal history of transient ischemic attack (TIA), and cerebral infarction without residual deficits: Secondary | ICD-10-CM | POA: Insufficient documentation

## 2011-12-09 LAB — BASIC METABOLIC PANEL
BUN: 21 mg/dL (ref 6–23)
CO2: 25 mEq/L (ref 19–32)
Calcium: 8.2 mg/dL — ABNORMAL LOW (ref 8.4–10.5)
Glucose, Bld: 99 mg/dL (ref 70–99)
Sodium: 142 mEq/L (ref 135–145)

## 2011-12-09 MED ORDER — TRAZODONE HCL 50 MG PO TABS: 50.0000 mg | ORAL_TABLET | Freq: Every evening | ORAL | Status: DC | PRN

## 2011-12-09 MED ORDER — MOXIFLOXACIN HCL 400 MG PO TABS: 400.0000 mg | ORAL_TABLET | Freq: Every day | ORAL | Status: AC

## 2011-12-09 MED ORDER — DIVALPROEX SODIUM 500 MG PO DR TAB: 1000.0000 mg | DELAYED_RELEASE_TABLET | Freq: Every day | ORAL | Status: DC

## 2011-12-09 NOTE — Progress Notes (Signed)
Pt discharged to home per MD order.  IV removed, pressure and dressing applied, site unremarkable.  Discharge instructions, prescriptions, and follow-up appointments given.  All belongings sent with pt and all questions answered.  Pt transported in wheelchair by volunteer and traveled home with wife by private vehicle.

## 2011-12-09 NOTE — Procedures (Signed)
EEG NUMBER:  [id number].  This portable routine EEG was requested in this 66 year old man who has a previous diagnosis of transient ischemic attacks.  He also suffers from tardive dyskinesia.  There is a recent episode of disorientation.  Medications include trazodone, Seroquel, and Effexor.  The EEG was done with the patient awake and agitated.  During periods of maximal wakefulness, background activities were composed of a posterior dominant rhythm  approximately 8 cycles per second that appeared to attenuated with eye opening.  It was of higher amplitudes over the paramedian regions but was symmetric.  This posterior dominant rhythm was superimposed upon diffuse alpha activities that were moderately to poorly organized.  Note was made of increased beta activities at the vertex.  Photic stimulation hyperventilation were not performed.  The patient did not fall asleep.  It should be noted throughout the tracing the patient was noted to have continuous mouth movements that looked typical for tardive dyskinesia.  EKG revealed a normal sinus rhythm.  CLINICAL INTERPRETATION:  This routine EEG done with the patient awake is abnormal.  Background activities are slightly too disorganized suggesting a mild encephalopathy of nonspecific etiology.  No interictal epileptiform discharges or electrographic seizures were seen.          ______________________________ Denton Meek, MD    [id number] D:  12/09/2011 17:50:36  T:  12/09/2011 18:11:21  Job #:  [id number]
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
## 2011-12-09 NOTE — Progress Notes (Signed)
I have seen and examined Mr.Surace. I agree with PE/AP as written by M.York, PA Davielle Lingelbach

## 2011-12-09 NOTE — Discharge Instructions (Signed)
DO NOT USE ANY RECREATIONAL DRUGS (MARIJUANA) AS THEY MAY INDUCE SEIZURES.  DO NOT USE ANY ALCOHOL AS IT MAY ALSO INDUCE SEIZURES.  USE YOUR ROLLING WALKER.

## 2011-12-09 NOTE — Progress Notes (Signed)
Patient ID: [name], male   DOB: 03-21-1946, 66 y.o.   MRN: [id number]  Consults:  Neurology.  Dr. Lyman Speller 12/07/11  Subjective: Feeling better.  Ready to go home.  Objective: Weight change:   Intake/Output Summary (Last 24 hours) at 12/09/11 0824 Last data filed at 12/09/11 0600  Gross per 24 hour  Intake 2003.75 ml  Output    800 ml  Net 1203.75 ml   Blood pressure 94/50, pulse 66, temperature 98.2 F (36.8 C), temperature source Oral, resp. rate 17, height 5\' 11"  (1.803 m), weight 86.3 kg (190 lb 4.1 oz), SpO2 98.00%. Filed Vitals:   12/08/11 0506 12/08/11 1345 12/08/11 2115 12/09/11 0452  BP: 92/33 103/52 149/57 94/50  Pulse: 53 62 72 66  Temp: 98 F (36.7 C) 97.9 F (36.6 C) 98.4 F (36.9 C) 98.2 F (36.8 C)  TempSrc: Oral Oral Oral Oral  Resp: 17 18 18 17   Height:      Weight:      SpO2: 97% 100% 99% 98%    Physical Exam: General: No acute distress, A&Ox3, continuous mouth movements. Lungs: Clear to auscultation bilaterally without wheezes or crackles Cardiovascular: Regular rate and rhythm without murmur gallop or rub normal S1 and S2 Abdomen: Nontender, nondistended, soft, bowel sounds positive, no rebound, no ascites, no appreciable mass Extremities: No significant cyanosis, clubbing, or edema bilateral lower extremities  Basic Metabolic Panel:  Lab 12/09/11 3875 12/08/11 0450 12/07/11 1413  NA 142 141 --  K 4.4 5.0 --  CL 107 104 --  CO2 25 28 --  GLUCOSE 99 110* --  BUN 21 21 --  CREATININE 1.37* 1.51* --  CALCIUM 8.2* 9.6 --  MG -- 2.8* 2.9*  PHOS -- -- --   Liver Function Tests:  Lab 12/06/11 1651 12/06/11 1157  AST 14 11  ALT 7 7  ALKPHOS 12* 43  BILITOT 0.3 0.2*  PROT 6.5 6.5  ALBUMIN 3.8 3.8   CBC:  Lab 12/08/11 0450 12/06/11 1157 12/02/11 2230  WBC 6.6 11.0* --  NEUTROABS -- 8.6* 2.5  HGB 11.7* 12.5* --  HCT 35.0* 37.4* --  MCV 92.1 91.9 --  PLT 153 167 --   Cardiac Enzymes:  Lab 12/07/11 0900 12/06/11 2248 12/06/11 1653   CKTOTAL 258* 311* 259*  CKMB 2.6 2.7 3.4  CKMBINDEX -- -- --  TROPONINI <0.30 <0.30 <0.30    Thyroid Function Tests: Results for [name], [name] (MRN [id number]) as of 12/08/2011 16:00  Ref. Range 12/08/2011 04:50  TSH Latest Range: 0.350-4.500 uIU/mL 1.568  Free T4 Latest Range: 0.80-1.80 ng/dL 8.41  T3, Free Latest Range: 2.3-4.2 pg/mL 2.7   Urine Drug Screen: Drugs of Abuse     Component Value Date/Time   LABOPIA POSITIVE* 12/06/2011 1333   COCAINSCRNUR NONE DETECTED 12/06/2011 1333   LABBENZ NONE DETECTED 12/06/2011 1333   AMPHETMU NONE DETECTED 12/06/2011 1333   THCU NONE DETECTED 12/06/2011 1333   LABBARB NONE DETECTED 12/06/2011 1333    Alcohol Level:  Lab 12/06/11 1336  ETH <11    Studies/Results:  EEG (3/7):  Completed.  Results pending.  Chest Xray (3/4):  IMPRESSION:  Possible air space disease in the left lower lobe.  MRI Brain 3/5:IMPRESSION:  Atrophy and chronic ischemic changes.  Probable small area of acute infarct left temporal lobe.  2D echo Study Conclusions  - Left ventricle: The cavity size was normal. Wall thickness was normal. Systolic function was normal. The estimated ejection fraction was in the range of  55% to 60%. Wall motion was normal; there were no regional wall motion abnormalities. Doppler parameters are consistent with abnormal left ventricular relaxation (grade 1 diastolic Dysfunction).  - Mitral valve: Calcified annulus.     Scheduled Meds:    . albuterol  2.5 mg Nebulization Once  . aspirin  325 mg Oral Daily  . atorvastatin  40 mg Oral q1800  . clopidogrel  75 mg Oral Q breakfast  . divalproex  1,000 mg Oral QHS  . divalproex  500 mg Oral q morning - 10a  . enoxaparin  40 mg Subcutaneous Q24H  . enoxaparin (LOVENOX) injection  40 mg Subcutaneous Once  . moxifloxacin  400 mg Oral q1800  . polyethylene glycol  17 g Oral Daily  . QUEtiapine  100 mg Oral QHS  . sodium chloride  3 mL Intravenous Q12H  . Tamsulosin HCl  0.4 mg Oral  BID  . venlafaxine  150 mg Oral Daily  . venlafaxine  75 mg Oral QHS  . DISCONTD: divalproex  500 mg Oral BID  . DISCONTD: metoprolol  25 mg Oral BID  . DISCONTD: moxifloxacin  400 mg Intravenous Q24H   Continuous Infusions:    . DISCONTD: sodium chloride 100 mL/hr at 12/08/11 1015  . DISCONTD: sodium chloride 75 mL/hr at 12/08/11 1621   PRN Meds:.acetaminophen, acetaminophen, levalbuterol, ondansetron (ZOFRAN) IV, ondansetron  Anti-infectives:  Anti-infectives     Start     Dose/Rate Route Frequency Ordered Stop   12/08/11 1800   moxifloxacin (AVELOX) tablet 400 mg        400 mg Oral Daily-1800 12/08/11 1612     12/07/11 0945   moxifloxacin (AVELOX) IVPB 400 mg  Status:  Discontinued        400 mg 250 mL/hr over 60 Minutes Intravenous Every 24 hours 12/07/11 0816 12/08/11 1612          Assessment/Plan: Active Problems:  Altered mental status - Patient confused on admission.  Hallucinating over first night (saw a ware wolf and a mobile home in his room).  A&O today.  CT Head negative.  Received PT / OT evaluation. Small area of Infarct noted on MRI in left temporal lobe.  Acute Left Temporal Lobe infarct versus Postictal DWI. Neurology recommends increasing Depakote ER to 500 mg in am and 1000 mg hs.  Patient on plavix prior to admission.  Will remain on plavix.  Pneumonia - CAP vs Aspiration (patient was vomiting before fall).  Avelox started 3/5.  Elevated Creatinine - 1.24 at admission, 1.51 on 3/6.  Will give gentle fluids and recheck bmet in am.  Diabetes Type II - well controlled.  Hgb A1C 6.1.  Falls - Physical Therapy recommends  OPPT for balance training.  Recreational Drug Use:  Marijuana.  Counseled to stop using.  Hypotension in am. Metoprolol stopped.   Disposition - patient refuses assisted living.  Will d/c to home in the care of his wife after EEG on  3/7.  Medical alert service offered by case management.  Out Patient PT for balance training will be  ordered.  Patient will have out patient follow up with Neurology in [place], PCP and Psych at [facility].    LOS: 3 days   Stephani Police 12/09/2011, 8:24 AM [phone]  Stephani Police

## 2011-12-09 NOTE — Discharge Summary (Signed)
I have personally seen and examined [name]  I agree with DC summary as per Adalberto Ill, PA Norina Cowper

## 2011-12-09 NOTE — Discharge Summary (Signed)
Patient ID: [name] MRN: [id number] DOB/AGE: 67-15-1947 67 y.o.  Admit date: 12/06/2011 Discharge date: 12/09/2011  Primary Care Physician:  Corinne Ports Administration.  Discharge Diagnoses:    Present on Admission:  *Altered Mental Status .Seizure disorder .Pneumonia Community: Acquired vs Aspiration .DIABETES MELLITUS, TYPE II *Severe Carotid Stenosis .HYPERLIPIDEMIA *History of multiple chronic cerebral vascular infarcts bilaterally *Tardive Dyskinesia    Medication List  As of 12/09/2011 12:18 PM   STOP taking these medications         aspirin 81 MG tablet      HYDROcodone-acetaminophen 5-500 MG per tablet      metoprolol 50 MG tablet      simvastatin 20 MG tablet         TAKE these medications         clonazePAM 0.5 MG tablet   Commonly known as: KLONOPIN   Take 0.5 mg by mouth 2 (two) times daily as needed.      clopidogrel 75 MG tablet   Commonly known as: PLAVIX   Take 75 mg by mouth daily.      CYANOCOBALAMIN IJ   Inject 1,000 mg as directed every 30 (thirty) days. On the 2nd of the month      divalproex 500 MG DR tablet   Commonly known as: DEPAKOTE   Take 2 tablets (1,000 mg total) by mouth at bedtime. TAKE 2 TABLETS AT BED TIME.  TAKE 1 TAB IN THE MORNING.      FLOMAX 0.4 MG Caps   Generic drug: Tamsulosin HCl   Take 0.4 mg by mouth 2 (two) times daily.      LACTOBACILLUS PO   Take 2 capsules by mouth 2 (two) times daily.      moxifloxacin 400 MG tablet   Commonly known as: AVELOX   Take 1 tablet (400 mg total) by mouth daily at 6 PM.      QUEtiapine 200 MG tablet   Commonly known as: SEROQUEL   Take 100 mg by mouth at bedtime.      simvastatin 80 MG tablet   Commonly known as: ZOCOR   Take 40 mg by mouth at bedtime.      traZODone 50 MG tablet   Commonly known as: DESYREL   Take 1 tablet (50 mg total) by mouth at bedtime as needed for sleep.      venlafaxine 75 MG tablet   Commonly known as: EFFEXOR   Take 75 mg by mouth as  directed. 2 po qam and 1 po qhs            Consults:  1.  Neurology - Dr. Lyman Speller 2.  Stroke Team - Dr. Pearlean Brownie  Brief H and P: From the admission note:  Spouse reports that she found her husband on the floor this morning and with an altered level of consciousness. He has a past medical history of PTSD, CABG, stroke, hypertension and a low heart rate. He goes to the [facility] in [place]. He sees Dr. Kirtland Bouchard for his heart problems.  Wife has been staying at her parents house and taking care of them for the past 2 days. Last time she talked to her husband was last night in a normal state. She found him this morning he was confused and on the ground. 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 He is on Effexor, Seroquel, Vicodin Depakote and Klonopin for his psych problems. Patient has been in charge of dispensing his own medicines for the last  2 days. Wife unsure if he has taken the wrong medicines or too much medication.  1.  Altered mental status. The patient was confused on admission. Per his wife's report he hallucinated during his first night in the hospital (seeing werewolves and a mobile home in his hospital room).  By the following morning his mental status had cleared up he was no longer confused or having hallucinations rather he was at his baseline.  He had an extensive radiological workup for altered mental status including a CT head which was negative for acute changes, a CT angiogram of his neck and head, an MRI of his brain, and an EEG. He was seen first by neurology who recommended CT angiogram of his head and neck and felt that he could have possibly had a new left temporal acute infarct in the setting of multiple chronic infarcts in the cerebellum also total lobes bilaterally and bilateral thalamus.  He was then seen by the stroke team who did further evaluation.  Their review indicated that the new left temporal lobe abnormality on the MRI was the effect of seizure rather  than stroke. An EEG was ordered it has been completed this morning, and results are pending. The patient and his wife will receive those results during a follow up appointment with Neurology.  Increased Depakote dosage: 500 mg in the morning and 1000 mg at bedtime was prescribed.  2.  Pneumonia.  The patient was noted to have left lower lobe pneumonia on chest x-ray done at the time of admission. This could have been community-acquired but also could have been caused by aspiration during seizure. The patient was treated with Avelox and will go home with an Avelox prescription to finish a 7 day course of treatment.  3.  DM II.  the patient's hemoglobin A1c was 6.0 indicating good diabetic control at home. His CBGs during his hospital stay remained within normal limits.  4.  Hypertension/Hyptension.  The patient was originally continued on metoprolol however, he was found to have abnormal a low blood pressure particularly in the morning (94/50).  Consequently his metoprolol was discontinued.  The patient received gentle IV fluids however his blood pressure remained relatively low particularly in the morning. Orthostatic vital signs were checked and found to be negative. We recommend that he followup with his primary care physician regarding his blood pressure medications and whether or not they need to be restarted.  5.  History of multiple chronic cerebral vascular infarcts bilaterally.  The patient will be maintained on Plavix and an 81 mg aspirin in order to try to prevent future strokes.  6.  Severe carotid stenosis.  As illustrated in the results of CTA head and neck listed below the patient has severe carotid stenosis and impaired vasculature to his brain.  He will be maintained on Plavix and aspirin.  7.  abnormal labs. The patient had a serum magnesium of 0.2 initially. He also had a TSH of 6.17. These labs appear to be erroneous as followup laboratory work revealed normal values.      Physical  Exam on Discharge: General: No acute distress, A&Ox3, continuous mouth movements.  Lungs: Clear to auscultation bilaterally without wheezes or crackles  Cardiovascular: Regular rate and rhythm without murmur gallop or rub normal S1 and S2  Abdomen: Nontender, nondistended, soft, bowel sounds positive, no rebound, no ascites, no appreciable mass  Extremities: No significant cyanosis, clubbing, or edema bilateral lower extremities    Filed Vitals:   12/08/11 0506 12/08/11  1345 12/08/11 2115 12/09/11 0452  BP: 92/33 103/52 149/57 94/50  Pulse: 53 62 72 66  Temp: 98 F (36.7 C) 97.9 F (36.6 C) 98.4 F (36.9 C) 98.2 F (36.8 C)  TempSrc: Oral Oral Oral Oral  Resp: 17 18 18 17   Height:      Weight:      SpO2: 97% 100% 99% 98%     Intake/Output Summary (Last 24 hours) at 12/09/11 1218 Last data filed at 12/09/11 0900  Gross per 24 hour  Intake 1363.75 ml  Output    300 ml  Net 1063.75 ml    Basic Metabolic Panel:  Lab 12/09/11 1610 12/08/11 0450 12/07/11 1413  NA 142 141 --  K 4.4 5.0 --  CL 107 104 --  CO2 25 28 --  GLUCOSE 99 110* --  BUN 21 21 --  CREATININE 1.37* 1.51* --  CALCIUM 8.2* 9.6 --  MG -- 2.8* 2.9*  PHOS -- -- --   Liver Function Tests:  Lab 12/06/11 1651 12/06/11 1157  AST 14 11  ALT 7 7  ALKPHOS 12* 43  BILITOT 0.3 0.2*  PROT 6.5 6.5  ALBUMIN 3.8 3.8    Lab 12/06/11 1337  AMMONIA 27   CBC:  Lab 12/08/11 0450 12/06/11 1157 12/02/11 2230  WBC 6.6 11.0* --  NEUTROABS -- 8.6* 2.5  HGB 11.7* 12.5* --  HCT 35.0* 37.4* --  MCV 92.1 91.9 --  PLT 153 167 --   Cardiac Enzymes:  Lab 12/07/11 0900 12/06/11 2248 12/06/11 1653  CKTOTAL 258* 311* 259*  CKMB 2.6 2.7 3.4  CKMBINDEX -- -- --  TROPONINI <0.30 <0.30 <0.30   Hemoglobin A1C:  Lab 12/06/11 1651  HGBA1C 6.0*   Fasting Lipid Panel:  Lab 12/08/11 0500  CHOL 221*  HDL 31*  LDLCALC 136*  TRIG 268*  CHOLHDL 7.1  LDLDIRECT --   Thyroid Function Tests:  Lab 12/08/11 0450    TSH 1.568  T4TOTAL --  FREET4 1.11  T3FREE 2.7  THYROIDAB --   Drugs of Abuse     Component Value Date/Time   LABOPIA POSITIVE* 12/06/2011 1333   COCAINSCRNUR NONE DETECTED 12/06/2011 1333   LABBENZ NONE DETECTED 12/06/2011 1333   AMPHETMU NONE DETECTED 12/06/2011 1333   THCU NONE DETECTED 12/06/2011 1333   LABBARB NONE DETECTED 12/06/2011 1333    Alcohol Level:  Lab 12/06/11 1336  ETH <11     Significant Diagnostic Studies:   2D echo Study Conclusions  - Left ventricle: The cavity size was normal. Wall thickness was normal. Systolic function was normal. The estimated ejection fraction was in the range of 55% to 60%. Wall motion was normal; there were no regional wall motion abnormalities. Doppler parameters are consistent with abnormal left ventricular relaxation (grade 1 diastolic Dysfunction).  - Mitral valve: Calcified annulus.  Ct Angio Head W/cm &/or Wo Cm  12/08/2011  *RADIOLOGY REPORT*  Clinical Data:  Stroke.  Recent episode of gait difficulty. History of carotid stenosis.  CT ANGIOGRAPHY HEAD AND NECK     CTA NECK  Findings:   IMPRESSION:  1. The right internal carotid artery is occluded just beyond the bifurcation without significant reconstitution in the neck. 2.  50% stenosis of the right vertebral artery at the level of C2 with some scant complete occlusion at the level of C1. 3.  Focal narrowing of the left internal carotid artery just beyond the bifurcation measures less than 50% relative to the distal vessel.    CTA  HEAD    IMPRESSION:  1.  Reconstitution of the distal right internal carotid artery, predominately within the supraclinoid segment.  This is presumably reconstituted from above. 2.  Hypoplastic left A1 segment, likely congenital. 3.  Mild small vessel disease within the anterior circulation. 4.  The proximal basilar artery is occluded with minimal irregular flow in the distal segment. 5.  The posterior circulation appears to be reconstituted from the right  posterior communicating artery, which is presumably reconstituted from the anterior communicating artery. 6.  Barring other significant collateral flow, the entire intracranial circulation is dependent on the left internal carotid artery.  This could be confirmed with catheter arteriogram if clinically indicated. 7.  Significant attenuation and segmental narrowing of the left posterior cerebral artery.  Dg Chest 1 View  12/06/2011  *RADIOLOGY REPORT*  Clinical Data: Confusion.  CHEST - 1 VIEW .  IMPRESSION: Possible air space disease in the left lower lobe.    Ct Head Wo Contrast  12/06/2011  *RADIOLOGY REPORT*  Clinical Data: Dizziness.  Confusion.  CT HEAD WITHOUT CONTRAST   IMPRESSION: Old cerebellar infarctions.  Old right occipital infarction.  No acute or reversible findings.    Mr Brain Wo Contrast  12/07/2011  *RADIOLOGY REPORT*  Clinical Data: Altered mental status.  Rule out CVA  MRI HEAD WITHOUT CONTRAST  Technique:  Multiplanar, multiecho pulse sequences of the brain and surrounding structures were obtained according to standard protocol without intravenous contrast.  Comparison: CT 12/06/2011, MRI 10/14/2008  Findings: Probable small area of restricted diffusion in the left mid temporal lobe compatible with acute infarct.  No other areas of acute infarct.  Chronic infarcts in the cerebellum and occipital lobes bilaterally. Chronic infarcts in the white matter bilaterally.  Chronic small infarcts in the right thalamus.  Negative for mass lesion.  Small focus of micro hemorrhage left parietal white matter.  Image quality degraded by patient motion.  IMPRESSION: Atrophy and chronic ischemic changes.  Probable small area of acute infarct left temporal lobe.      Disposition and Follow-up:  Mr. Gildersleeve was strongly encouraged to move into assisted living facility but he refused this option. He has received maximum hospital benefit. He'll be discharged home in stable condition  The patient will  receive out patient physical therapy for balance.  Discharge Orders    Future Orders Please Complete By Expires   Diet - low sodium heart healthy      Increase activity slowly      Discharge instructions      Comments:   1.  FOLLOW UP WITH [facility] IN 2 WEEKS. For EEG results.  2.  SEE YOUR PRIMARY CARE PHYSICIAN FOR HOSPITAL FOLLOW UP IN 1 WEEK .   3.  SEE YOUR PSYCHIATRIST FOR MEDICATION ADJUSTMENT IN 1 TO 2 WEEKS.  4.  IT IS IMPORTANT TO USE YOUR ROLLING WALKER TO AVOID FALLING.        Follow-up Information    Schedule an appointment as soon as possible for a visit with your primary care physician.      Please follow up. (CALL [facility]:  schedule hospital follow up in 2 week.s.  [phone])           Time spent on Discharge: 40 min.  SignedStephani Police 12/09/2011, 12:18 PM [phone]

## 2011-12-15 ENCOUNTER — Ambulatory Visit: Payer: Federal, State, Local not specified - PPO | Attending: Internal Medicine | Admitting: Physical Therapy

## 2011-12-15 DIAGNOSIS — R269 Unspecified abnormalities of gait and mobility: Secondary | ICD-10-CM | POA: Insufficient documentation

## 2011-12-15 DIAGNOSIS — M6281 Muscle weakness (generalized): Secondary | ICD-10-CM | POA: Insufficient documentation

## 2011-12-15 DIAGNOSIS — IMO0001 Reserved for inherently not codable concepts without codable children: Secondary | ICD-10-CM | POA: Insufficient documentation

## 2011-12-17 ENCOUNTER — Encounter: Payer: Self-pay | Admitting: Cardiology

## 2011-12-20 ENCOUNTER — Emergency Department (HOSPITAL_COMMUNITY)
Admission: EM | Admit: 2011-12-20 | Discharge: 2011-12-20 | Disposition: A | Discharge status: Home / Self Care | Payer: Federal, State, Local not specified - PPO | Attending: Emergency Medicine | Admitting: Emergency Medicine

## 2011-12-20 ENCOUNTER — Encounter (HOSPITAL_COMMUNITY): Payer: Self-pay | Admitting: Emergency Medicine

## 2011-12-20 ENCOUNTER — Emergency Department (HOSPITAL_COMMUNITY): Payer: Federal, State, Local not specified - PPO

## 2011-12-20 DIAGNOSIS — H5710 Ocular pain, unspecified eye: Secondary | ICD-10-CM | POA: Insufficient documentation

## 2011-12-20 DIAGNOSIS — G8929 Other chronic pain: Secondary | ICD-10-CM | POA: Insufficient documentation

## 2011-12-20 DIAGNOSIS — H5310 Unspecified subjective visual disturbances: Secondary | ICD-10-CM

## 2011-12-20 DIAGNOSIS — Z8679 Personal history of other diseases of the circulatory system: Secondary | ICD-10-CM | POA: Insufficient documentation

## 2011-12-20 DIAGNOSIS — E119 Type 2 diabetes mellitus without complications: Secondary | ICD-10-CM | POA: Insufficient documentation

## 2011-12-20 DIAGNOSIS — E538 Deficiency of other specified B group vitamins: Secondary | ICD-10-CM | POA: Insufficient documentation

## 2011-12-20 DIAGNOSIS — M79609 Pain in unspecified limb: Secondary | ICD-10-CM | POA: Insufficient documentation

## 2011-12-20 DIAGNOSIS — J449 Chronic obstructive pulmonary disease, unspecified: Secondary | ICD-10-CM | POA: Insufficient documentation

## 2011-12-20 DIAGNOSIS — R51 Headache: Secondary | ICD-10-CM | POA: Insufficient documentation

## 2011-12-20 DIAGNOSIS — J4489 Other specified chronic obstructive pulmonary disease: Secondary | ICD-10-CM | POA: Insufficient documentation

## 2011-12-20 DIAGNOSIS — H538 Other visual disturbances: Secondary | ICD-10-CM | POA: Insufficient documentation

## 2011-12-20 DIAGNOSIS — G319 Degenerative disease of nervous system, unspecified: Secondary | ICD-10-CM | POA: Insufficient documentation

## 2011-12-20 DIAGNOSIS — E669 Obesity, unspecified: Secondary | ICD-10-CM | POA: Insufficient documentation

## 2011-12-20 DIAGNOSIS — K219 Gastro-esophageal reflux disease without esophagitis: Secondary | ICD-10-CM | POA: Insufficient documentation

## 2011-12-20 DIAGNOSIS — I2510 Atherosclerotic heart disease of native coronary artery without angina pectoris: Secondary | ICD-10-CM | POA: Insufficient documentation

## 2011-12-20 DIAGNOSIS — E785 Hyperlipidemia, unspecified: Secondary | ICD-10-CM | POA: Insufficient documentation

## 2011-12-20 DIAGNOSIS — F4310 Post-traumatic stress disorder, unspecified: Secondary | ICD-10-CM | POA: Insufficient documentation

## 2011-12-20 DIAGNOSIS — I10 Essential (primary) hypertension: Secondary | ICD-10-CM | POA: Insufficient documentation

## 2011-12-20 LAB — POCT I-STAT, CHEM 8
BUN: 16 mg/dL (ref 6–23)
Calcium, Ion: 1.22 mmol/L (ref 1.12–1.32)
Creatinine, Ser: 1 mg/dL (ref 0.50–1.35)
Glucose, Bld: 116 mg/dL — ABNORMAL HIGH (ref 70–99)
Hemoglobin: 12.9 g/dL — ABNORMAL LOW (ref 13.0–17.0)
TCO2: 24 mmol/L (ref 0–100)

## 2011-12-20 LAB — CBC
HCT: 36.9 % — ABNORMAL LOW (ref 39.0–52.0)
MCH: 31.1 pg (ref 26.0–34.0)
MCV: 92.5 fL (ref 78.0–100.0)
RBC: 3.99 MIL/uL — ABNORMAL LOW (ref 4.22–5.81)
WBC: 7.6 10*3/uL (ref 4.0–10.5)

## 2011-12-20 LAB — DIFFERENTIAL
Eosinophils Absolute: 0.1 10*3/uL (ref 0.0–0.7)
Eosinophils Relative: 1 % (ref 0–5)
Lymphocytes Relative: 19 % (ref 12–46)
Lymphs Abs: 1.5 10*3/uL (ref 0.7–4.0)
Monocytes Absolute: 0.9 10*3/uL (ref 0.1–1.0)
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
## 2011-12-20 NOTE — Discharge Instructions (Signed)
Headache, General, Unknown Cause The specific cause of your headache may not have been found today. There are many causes and types of headache. A few common ones are:  Tension headache.   Migraine.   Infections (examples: dental and sinus infections).   Bone and/or joint problems in the neck or jaw.   Depression.   Eye problems.  These headaches are not life threatening.  Headaches can sometimes be diagnosed by a patient history and a physical exam. Sometimes, lab and imaging studies (such as x-ray and/or CT scan) are used to rule out more serious problems. In some cases, a spinal tap (lumbar puncture) may be requested. There are many times when your exam and tests may be normal on the first visit even when there is a serious problem causing your headaches. Because of that, it is very important to follow up with your doctor or local clinic for further evaluation. FINDING OUT THE RESULTS OF TESTS  If a radiology test was performed, a radiologist will review your results.   You will be contacted by the emergency department or your physician if any test results require a change in your treatment plan.   Not all test results may be available during your visit. If your test results are not back during the visit, make an appointment with your caregiver to find out the results. Do not assume everything is normal if you have not heard from your caregiver or the medical facility. It is important for you to follow up on all of your test results.  HOME CARE INSTRUCTIONS   Keep follow-up appointments with your caregiver, or any specialist referral.   Only take over-the-counter or prescription medicines for pain, discomfort, or fever as directed by your caregiver.   Biofeedback, massage, or other relaxation techniques may be helpful.   Ice packs or heat applied to the head and neck can be used. Do this three to four times per day, or as needed.   Call your doctor if you have any questions or  concerns.   If you smoke, you should quit.  SEEK MEDICAL CARE IF:   You develop problems with medications prescribed.   You do not respond to or obtain relief from medications.   You have a change from the usual headache.   You develop nausea or vomiting.  SEEK IMMEDIATE MEDICAL CARE IF:   If your headache becomes severe.   You have an unexplained oral temperature above 102 F (38.9 C), or as your caregiver suggests.   You have a stiff neck.   You have loss of vision.   You have muscular weakness.   You have loss of muscular control.   You develop severe symptoms different from your first symptoms.   You start losing your balance or have trouble walking.   You feel faint or pass out.  MAKE SURE YOU:   Understand these instructions.   Will watch your condition.   Will get help right away if you are not doing well or get worse.  Document Released: 09/20/2005 Document Revised: 09/09/2011 Document Reviewed: 05/09/2008 [facility] Patient Information 2012 [place], [place]. In nature.  Head CT is normal.  Your lab work is normal.  Your Depakote level is therapeutic.  Please call Dr. Laury Axon in the morning as a followup.  Please keep your appointment for gait training as scheduled

## 2011-12-20 NOTE — ED Notes (Signed)
Pt to ED for eval of seeing blue flashes and seeing black spots this AM, and has not gone away; pt c/o generalized pain and reports d/t fall about 1.5 weeks ago; pt c/o head pain- reports that he has this chronically but hurts worse today; pt reports unable to sleep for 2-3 days; a&OX4;

## 2011-12-20 NOTE — ED Notes (Signed)
Pt to ED with wife who st's pt was c/o seeing blue flashing lights in his eye.  Pt st's he had a headache but now pain has gone into his neck.  Wife at bedside.

## 2011-12-20 NOTE — ED Provider Notes (Signed)
Medical screening examination/treatment/procedure(s) were conducted as a shared visit with non-physician practitioner(s) and myself.  I personally evaluated the patient during the encounter.  No neuro deficits. CT scan shows no acute changes. Patient feeling much better at discharge  Donnetta Hutching, MD 12/20/11 2348

## 2011-12-21 ENCOUNTER — Ambulatory Visit: Payer: Federal, State, Local not specified - PPO | Admitting: Physical Therapy

## 2011-12-24 ENCOUNTER — Ambulatory Visit: Payer: Federal, State, Local not specified - PPO | Admitting: Physical Therapy

## 2011-12-28 ENCOUNTER — Ambulatory Visit: Payer: Federal, State, Local not specified - PPO | Admitting: Physical Therapy

## 2011-12-30 ENCOUNTER — Ambulatory Visit: Payer: Federal, State, Local not specified - PPO | Admitting: Physical Therapy

## 2012-01-04 ENCOUNTER — Ambulatory Visit: Payer: Federal, State, Local not specified - PPO | Attending: Internal Medicine | Admitting: Physical Therapy

## 2012-01-04 DIAGNOSIS — IMO0001 Reserved for inherently not codable concepts without codable children: Secondary | ICD-10-CM | POA: Insufficient documentation

## 2012-01-04 DIAGNOSIS — R269 Unspecified abnormalities of gait and mobility: Secondary | ICD-10-CM | POA: Insufficient documentation

## 2012-01-04 DIAGNOSIS — M6281 Muscle weakness (generalized): Secondary | ICD-10-CM | POA: Insufficient documentation

## 2012-01-06 ENCOUNTER — Ambulatory Visit: Payer: Medicare Other | Admitting: Physical Therapy

## 2012-01-11 ENCOUNTER — Ambulatory Visit: Payer: Medicare Other | Admitting: Physical Therapy

## 2012-01-13 ENCOUNTER — Ambulatory Visit: Payer: Federal, State, Local not specified - PPO | Admitting: Physical Therapy

## 2012-02-14 ENCOUNTER — Emergency Department (HOSPITAL_COMMUNITY): Payer: Federal, State, Local not specified - PPO

## 2012-02-14 ENCOUNTER — Observation Stay (HOSPITAL_COMMUNITY)
Admission: EM | Admit: 2012-02-14 | Discharge: 2012-02-17 | Disposition: A | Discharge status: Home / Self Care | Payer: Federal, State, Local not specified - PPO | Source: Ambulatory Visit | Attending: Internal Medicine | Admitting: Internal Medicine

## 2012-02-14 ENCOUNTER — Encounter (HOSPITAL_COMMUNITY): Payer: Self-pay | Admitting: Emergency Medicine

## 2012-02-14 DIAGNOSIS — R972 Elevated prostate specific antigen [PSA]: Secondary | ICD-10-CM

## 2012-02-14 DIAGNOSIS — I6529 Occlusion and stenosis of unspecified carotid artery: Secondary | ICD-10-CM

## 2012-02-14 DIAGNOSIS — Z860100 Personal history of colon polyps, unspecified: Secondary | ICD-10-CM

## 2012-02-14 DIAGNOSIS — I714 Abdominal aortic aneurysm, without rupture, unspecified: Secondary | ICD-10-CM

## 2012-02-14 DIAGNOSIS — K219 Gastro-esophageal reflux disease without esophagitis: Secondary | ICD-10-CM

## 2012-02-14 DIAGNOSIS — K922 Gastrointestinal hemorrhage, unspecified: Secondary | ICD-10-CM

## 2012-02-14 DIAGNOSIS — R109 Unspecified abdominal pain: Secondary | ICD-10-CM | POA: Insufficient documentation

## 2012-02-14 DIAGNOSIS — R4182 Altered mental status, unspecified: Secondary | ICD-10-CM

## 2012-02-14 DIAGNOSIS — N179 Acute kidney failure, unspecified: Secondary | ICD-10-CM | POA: Insufficient documentation

## 2012-02-14 DIAGNOSIS — F3289 Other specified depressive episodes: Secondary | ICD-10-CM

## 2012-02-14 DIAGNOSIS — K648 Other hemorrhoids: Principal | ICD-10-CM

## 2012-02-14 DIAGNOSIS — Z8673 Personal history of transient ischemic attack (TIA), and cerebral infarction without residual deficits: Secondary | ICD-10-CM

## 2012-02-14 DIAGNOSIS — D696 Thrombocytopenia, unspecified: Secondary | ICD-10-CM

## 2012-02-14 DIAGNOSIS — G40909 Epilepsy, unspecified, not intractable, without status epilepticus: Secondary | ICD-10-CM

## 2012-02-14 DIAGNOSIS — E119 Type 2 diabetes mellitus without complications: Secondary | ICD-10-CM

## 2012-02-14 DIAGNOSIS — S0990XA Unspecified injury of head, initial encounter: Secondary | ICD-10-CM

## 2012-02-14 DIAGNOSIS — Z8679 Personal history of other diseases of the circulatory system: Secondary | ICD-10-CM

## 2012-02-14 DIAGNOSIS — I2510 Atherosclerotic heart disease of native coronary artery without angina pectoris: Secondary | ICD-10-CM

## 2012-02-14 DIAGNOSIS — J189 Pneumonia, unspecified organism: Secondary | ICD-10-CM

## 2012-02-14 DIAGNOSIS — K589 Irritable bowel syndrome without diarrhea: Secondary | ICD-10-CM

## 2012-02-14 DIAGNOSIS — Z87440 Personal history of urinary (tract) infections: Secondary | ICD-10-CM

## 2012-02-14 DIAGNOSIS — R5381 Other malaise: Secondary | ICD-10-CM

## 2012-02-14 DIAGNOSIS — Z951 Presence of aortocoronary bypass graft: Secondary | ICD-10-CM | POA: Insufficient documentation

## 2012-02-14 DIAGNOSIS — F329 Major depressive disorder, single episode, unspecified: Secondary | ICD-10-CM

## 2012-02-14 DIAGNOSIS — R197 Diarrhea, unspecified: Secondary | ICD-10-CM

## 2012-02-14 DIAGNOSIS — I6350 Cerebral infarction due to unspecified occlusion or stenosis of unspecified cerebral artery: Secondary | ICD-10-CM

## 2012-02-14 DIAGNOSIS — G2401 Drug induced subacute dyskinesia: Secondary | ICD-10-CM

## 2012-02-14 DIAGNOSIS — E785 Hyperlipidemia, unspecified: Secondary | ICD-10-CM

## 2012-02-14 DIAGNOSIS — R5383 Other fatigue: Secondary | ICD-10-CM

## 2012-02-14 DIAGNOSIS — K7689 Other specified diseases of liver: Secondary | ICD-10-CM

## 2012-02-14 DIAGNOSIS — I10 Essential (primary) hypertension: Secondary | ICD-10-CM

## 2012-02-14 DIAGNOSIS — R269 Unspecified abnormalities of gait and mobility: Secondary | ICD-10-CM

## 2012-02-14 LAB — CBC
MCH: 31 pg (ref 26.0–34.0)
MCHC: 35.1 g/dL (ref 30.0–36.0)
MCV: 88.4 fL (ref 78.0–100.0)
Platelets: 144 10*3/uL — ABNORMAL LOW (ref 150–400)
RDW: 12.4 % (ref 11.5–15.5)

## 2012-02-14 LAB — DIFFERENTIAL
Basophils Absolute: 0 10*3/uL (ref 0.0–0.1)
Basophils Relative: 0 % (ref 0–1)
Eosinophils Absolute: 0.2 10*3/uL (ref 0.0–0.7)
Eosinophils Relative: 2 % (ref 0–5)
Lymphocytes Relative: 29 % (ref 12–46)
Lymphs Abs: 2 10*3/uL (ref 0.7–4.0)
Monocytes Absolute: 1 10*3/uL (ref 0.1–1.0)
Monocytes Relative: 15 % — ABNORMAL HIGH (ref 3–12)
Neutro Abs: 3.8 10*3/uL (ref 1.7–7.7)
Neutrophils Relative %: 54 % (ref 43–77)

## 2012-02-14 LAB — URINALYSIS, ROUTINE W REFLEX MICROSCOPIC
Specific Gravity, Urine: 1.018 (ref 1.005–1.030)
Urobilinogen, UA: 0.2 mg/dL (ref 0.0–1.0)
pH: 5.5 (ref 5.0–8.0)

## 2012-02-14 LAB — COMPREHENSIVE METABOLIC PANEL
AST: 29 U/L (ref 0–37)
Albumin: 4 g/dL (ref 3.5–5.2)
CO2: 24 mEq/L (ref 19–32)
Calcium: 9.4 mg/dL (ref 8.4–10.5)
Creatinine, Ser: 1.44 mg/dL — ABNORMAL HIGH (ref 0.50–1.35)
GFR calc non Af Amer: 49 mL/min — ABNORMAL LOW (ref >90)
Total Protein: 7.5 g/dL (ref 6.0–8.3)

## 2012-02-14 LAB — OCCULT BLOOD, POC DEVICE: Fecal Occult Bld: POSITIVE

## 2012-02-14 LAB — URINE MICROSCOPIC-ADD ON

## 2012-02-14 MED ORDER — SODIUM CHLORIDE 0.9 % IV BOLUS (SEPSIS)
500.0000 mL | Freq: Once | INTRAVENOUS | Status: AC
  Administered 2012-02-14: 500 mL via INTRAVENOUS

## 2012-02-14 MED ORDER — IOHEXOL 300 MG/ML  SOLN
40.0000 mL | Freq: Once | INTRAMUSCULAR | Status: AC | PRN
  Administered 2012-02-14: 40 mL via ORAL

## 2012-02-14 MED ORDER — IOHEXOL 300 MG/ML  SOLN
100.0000 mL | Freq: Once | INTRAMUSCULAR | Status: AC | PRN
  Administered 2012-02-14: 100 mL via INTRAVENOUS

## 2012-02-14 MED ORDER — SODIUM CHLORIDE 0.9 % IV SOLN
80.0000 mg | INTRAVENOUS | Status: AC
  Administered 2012-02-14: 80 mg via INTRAVENOUS
  Filled 2012-02-14: qty 80

## 2012-02-14 NOTE — ED Notes (Signed)
Pt resting quietly, pt denies any pain or complaints at this time. Plan of care is updated with verbal understanding. Pt is awaiting bed assignment for admission stay. Will continue to monitor pt.

## 2012-02-14 NOTE — ED Notes (Signed)
Ortho called and made aware of splinting for pt.
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
## 2012-02-14 NOTE — ED Provider Notes (Signed)
I saw and evaluated the patient, reviewed the resident's note and I agree with the findings and plan.  Pt without abdominal TTP at this time.  Heme stable however has multiple co morbidities.  Will consult medicine for serial observation, HCT.    Celene Kras, MD 02/14/12 [phone]

## 2012-02-14 NOTE — ED Notes (Signed)
Pt reports lower cramping abd pain starting this AM, reports bright red blood with clots from rectum when having BM starting this AM; reports feeling lethargic; last episode a couple hours ago

## 2012-02-14 NOTE — ED Notes (Signed)
CT notified of "pt ready".

## 2012-02-15 ENCOUNTER — Encounter (HOSPITAL_COMMUNITY): Payer: Self-pay | Admitting: Internal Medicine

## 2012-02-15 DIAGNOSIS — K589 Irritable bowel syndrome without diarrhea: Secondary | ICD-10-CM

## 2012-02-15 DIAGNOSIS — K922 Gastrointestinal hemorrhage, unspecified: Secondary | ICD-10-CM

## 2012-02-15 DIAGNOSIS — G40309 Generalized idiopathic epilepsy and epileptic syndromes, not intractable, without status epilepticus: Secondary | ICD-10-CM

## 2012-02-15 DIAGNOSIS — I714 Abdominal aortic aneurysm, without rupture: Secondary | ICD-10-CM

## 2012-02-15 DIAGNOSIS — K648 Other hemorrhoids: Secondary | ICD-10-CM

## 2012-02-15 DIAGNOSIS — I2510 Atherosclerotic heart disease of native coronary artery without angina pectoris: Secondary | ICD-10-CM

## 2012-02-15 HISTORY — DX: Other hemorrhoids: K64.8

## 2012-02-15 HISTORY — DX: Irritable bowel syndrome, unspecified: K58.9

## 2012-02-15 LAB — CARDIAC PANEL(CRET KIN+CKTOT+MB+TROPI)
Relative Index: 3.9 — ABNORMAL HIGH (ref 0.0–2.5)
Troponin I: <0.3 ng/mL (ref <0.30)

## 2012-02-15 LAB — CBC
HCT: 39.2 % (ref 39.0–52.0)
HCT: 41.1 % (ref 39.0–52.0)
Hemoglobin: 13.8 g/dL (ref 13.0–17.0)
Hemoglobin: 13.7 g/dL (ref 13.0–17.0)
MCH: 30.4 pg (ref 26.0–34.0)
MCH: 30.6 pg (ref 26.0–34.0)
MCHC: 33.6 g/dL (ref 30.0–36.0)
MCV: 88.3 fL (ref 78.0–100.0)
MCV: 89 fL (ref 78.0–100.0)
Platelets: 104 10*3/uL — ABNORMAL LOW (ref 150–400)
Platelets: 119 10*3/uL — ABNORMAL LOW (ref 150–400)
RBC: 4.47 MIL/uL (ref 4.22–5.81)
RDW: 12.3 % (ref 11.5–15.5)
RDW: 12.4 % (ref 11.5–15.5)
WBC: 6.5 10*3/uL (ref 4.0–10.5)
WBC: 5.5 10*3/uL (ref 4.0–10.5)

## 2012-02-15 LAB — COMPREHENSIVE METABOLIC PANEL
ALT: 21 U/L (ref 0–53)
Albumin: 3.3 g/dL — ABNORMAL LOW (ref 3.5–5.2)
Alkaline Phosphatase: 56 U/L (ref 39–117)
BUN: 16 mg/dL (ref 6–23)
Chloride: 104 mEq/L (ref 96–112)
GFR calc Af Amer: 54 mL/min — ABNORMAL LOW (ref >90)
Glucose, Bld: 117 mg/dL — ABNORMAL HIGH (ref 70–99)
Potassium: 4.4 mEq/L (ref 3.5–5.1)
Sodium: 140 mEq/L (ref 135–145)
Total Bilirubin: 0.4 mg/dL (ref 0.3–1.2)

## 2012-02-15 LAB — PROTIME-INR: Prothrombin Time: 13.8 seconds (ref 11.6–15.2)

## 2012-02-15 LAB — GLUCOSE, CAPILLARY: Glucose-Capillary: 107 mg/dL — ABNORMAL HIGH (ref 70–99)

## 2012-02-15 LAB — TYPE AND SCREEN: ABO/RH(D): A POS

## 2012-02-15 MED ORDER — SODIUM CHLORIDE 0.9 % IJ SOLN
3.0000 mL | Freq: Two times a day (BID) | INTRAMUSCULAR | Status: DC
  Administered 2012-02-15 – 2012-02-16 (×3): 3 mL via INTRAVENOUS

## 2012-02-15 MED ORDER — DIVALPROEX SODIUM 500 MG PO DR TAB
500.0000 mg | DELAYED_RELEASE_TABLET | Freq: Every day | ORAL | Status: DC
  Administered 2012-02-15 – 2012-02-16 (×2): 500 mg via ORAL
  Filled 2012-02-15 (×3): qty 1

## 2012-02-15 MED ORDER — CLONAZEPAM 0.5 MG PO TABS
1.0000 mg | ORAL_TABLET | Freq: Two times a day (BID) | ORAL | Status: DC | PRN
  Administered 2012-02-15: 1 mg via ORAL
  Filled 2012-02-15: qty 2

## 2012-02-15 MED ORDER — ONDANSETRON HCL 4 MG/2ML IJ SOLN: 4.0000 mg | Freq: Four times a day (QID) | INTRAMUSCULAR | Status: DC | PRN

## 2012-02-15 MED ORDER — TRAZODONE HCL 50 MG PO TABS
50.0000 mg | ORAL_TABLET | Freq: Every evening | ORAL | Status: DC | PRN
  Filled 2012-02-15: qty 1

## 2012-02-15 MED ORDER — BIOTENE DRY MOUTH MT LIQD
15.0000 mL | Freq: Two times a day (BID) | OROMUCOSAL | Status: DC
  Administered 2012-02-15 – 2012-02-16 (×4): 15 mL via OROMUCOSAL

## 2012-02-15 MED ORDER — PANTOPRAZOLE SODIUM 40 MG PO TBEC
40.0000 mg | DELAYED_RELEASE_TABLET | Freq: Every day | ORAL | Status: DC
  Administered 2012-02-16 – 2012-02-17 (×2): 40 mg via ORAL
  Filled 2012-02-15 (×2): qty 1

## 2012-02-15 MED ORDER — HYDROCORTISONE ACETATE 25 MG RE SUPP
25.0000 mg | Freq: Two times a day (BID) | RECTAL | Status: DC
  Administered 2012-02-15 – 2012-02-17 (×4): 25 mg via RECTAL
  Filled 2012-02-15 (×7): qty 1

## 2012-02-15 MED ORDER — DICYCLOMINE HCL 20 MG PO TABS
20.0000 mg | ORAL_TABLET | Freq: Four times a day (QID) | ORAL | Status: DC | PRN
  Filled 2012-02-15: qty 1

## 2012-02-15 MED ORDER — QUETIAPINE FUMARATE 100 MG PO TABS
100.0000 mg | ORAL_TABLET | Freq: Every day | ORAL | Status: DC
  Administered 2012-02-15 – 2012-02-16 (×2): 100 mg via ORAL
  Filled 2012-02-15 (×3): qty 1

## 2012-02-15 MED ORDER — PANTOPRAZOLE SODIUM 40 MG IV SOLR
40.0000 mg | Freq: Two times a day (BID) | INTRAVENOUS | Status: DC
  Administered 2012-02-15 (×2): 40 mg via INTRAVENOUS
  Filled 2012-02-15 (×3): qty 40

## 2012-02-15 MED ORDER — SODIUM CHLORIDE 0.9 % IV SOLN
INTRAVENOUS | Status: DC
  Administered 2012-02-15 (×4): via INTRAVENOUS

## 2012-02-15 MED ORDER — TAMSULOSIN HCL 0.4 MG PO CAPS
0.4000 mg | ORAL_CAPSULE | Freq: Two times a day (BID) | ORAL | Status: DC
  Administered 2012-02-15 – 2012-02-17 (×6): 0.4 mg via ORAL
  Filled 2012-02-15 (×7): qty 1

## 2012-02-15 MED ORDER — VENLAFAXINE HCL 75 MG PO TABS
75.0000 mg | ORAL_TABLET | Freq: Two times a day (BID) | ORAL | Status: DC
  Administered 2012-02-15 (×2): 75 mg via ORAL
  Administered 2012-02-16: 150 mg via ORAL
  Administered 2012-02-16 – 2012-02-17 (×2): 75 mg via ORAL
  Filled 2012-02-15 (×6): qty 2

## 2012-02-15 MED ORDER — DIVALPROEX SODIUM 500 MG PO DR TAB
1000.0000 mg | DELAYED_RELEASE_TABLET | Freq: Every day | ORAL | Status: DC
  Administered 2012-02-15 – 2012-02-16 (×2): 1000 mg via ORAL
  Filled 2012-02-15 (×3): qty 2

## 2012-02-15 MED ORDER — ONDANSETRON HCL 4 MG PO TABS: 4.0000 mg | ORAL_TABLET | Freq: Four times a day (QID) | ORAL | Status: DC | PRN

## 2012-02-15 MED ORDER — ACETAMINOPHEN 325 MG PO TABS
650.0000 mg | ORAL_TABLET | Freq: Four times a day (QID) | ORAL | Status: DC | PRN
  Administered 2012-02-15: 650 mg via ORAL
  Filled 2012-02-15: qty 2

## 2012-02-15 MED ORDER — ACETAMINOPHEN 650 MG RE SUPP: 650.0000 mg | Freq: Four times a day (QID) | RECTAL | Status: DC | PRN

## 2012-02-15 MED ORDER — ATORVASTATIN CALCIUM 40 MG PO TABS
40.0000 mg | ORAL_TABLET | Freq: Every day | ORAL | Status: DC
  Administered 2012-02-15 – 2012-02-16 (×2): 40 mg via ORAL
  Filled 2012-02-15 (×3): qty 1

## 2012-02-15 NOTE — ED Notes (Signed)
Admitting MD at bedside, pt awaiting inpt beds assignment.  

## 2012-02-15 NOTE — Progress Notes (Signed)
Patient seen and examined, briefly 66 year old male with known history of coronary disease, CABG, hypertension, recent admission for seizures and pneumonia in March 2013, CVA is admitted with rectal bleeding. H&P by Dr. Toniann Fail today reviewed. - Continue clear liquid diet, IV protonix, GI consultation for endoscopy - Continue IV fluids for acute renal insufficiency - will follow, hemodynamically stable   Thelma Viana M.D. [facility] 02/15/2012, 11:55 AM  Pager: [phone]

## 2012-02-15 NOTE — ED Notes (Signed)
Verbal order by admitting MD Toniann Fail for pt to go to floor, inpatient bed assignment with no telemetry.
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
## 2012-02-16 ENCOUNTER — Encounter: Payer: Self-pay | Admitting: Internal Medicine

## 2012-02-16 LAB — CBC
MCV: 90.3 fL (ref 78.0–100.0)
Platelets: 103 10*3/uL — ABNORMAL LOW (ref 150–400)
RBC: 4.12 MIL/uL — ABNORMAL LOW (ref 4.22–5.81)
RDW: 12.2 % (ref 11.5–15.5)
WBC: 4.3 10*3/uL (ref 4.0–10.5)

## 2012-02-16 MED ORDER — SODIUM CHLORIDE 0.9 % IV SOLN
1500.0000 mg | Freq: Once | INTRAVENOUS | Status: AC
  Administered 2012-02-16: 1500 mg via INTRAVENOUS
  Filled 2012-02-16 (×2): qty 15

## 2012-02-16 MED ORDER — LEVETIRACETAM 500 MG PO TABS
500.0000 mg | ORAL_TABLET | Freq: Two times a day (BID) | ORAL | Status: DC
  Administered 2012-02-16 – 2012-02-17 (×2): 500 mg via ORAL
  Filled 2012-02-16 (×3): qty 1

## 2012-02-16 NOTE — Progress Notes (Signed)
Subjective: Patient seen and examined, denies any recurrence of rectal bleeding or any other complaints.  Objective: Vital signs in last 24 hours: Temp:  [98 F (36.7 C)-98.3 F (36.8 C)] 98.3 F (36.8 C) (05/15 1325) Pulse Rate:  [67-77] 71  (05/15 1325) Resp:  [18-20] 19  (05/15 1325) BP: (144-157)/(65-76) 156/68 mmHg (05/15 1325) SpO2:  [95 %-97 %] 97 % (05/15 1325) Weight:  [85.186 kg (187 lb 12.8 oz)] 85.186 kg (187 lb 12.8 oz) (05/15 0506) Weight change: 0.685 kg (1 lb 8.2 oz) Last BM Date: 02/14/12  Intake/Output from previous day: 05/14 0701 - 05/15 0700 In: 840 [P.O.:840] Out: 2175 [Urine:2175] Total I/O In: 600 [P.O.:600] Out: 550 [Urine:550]   Physical Exam: General: Alert, awake, , in no acute distress. Heart: Regular rate and rhythm, without murmurs, rubs, gallops. Lungs: Clear to auscultation bilaterally. Abdomen: Soft, nontender, nondistended, positive bowel sounds. Extremities: No clubbing cyanosis or edema with positive pedal pulses. Neuro: Grossly intact, nonfocal.    Lab Results: Results for orders placed during the hospital encounter of 02/14/12 (from the past 24 hour(s))  CBC     Status: Abnormal   Collection Time   02/16/12  5:06 AM      Component Value Range   WBC 4.3  4.0 - 10.5 (K/uL)   RBC 4.12 (*) 4.22 - 5.81 (MIL/uL)   Hemoglobin 12.5 (*) 13.0 - 17.0 (g/dL)   HCT 40.9 (*) 81.1 - 52.0 (%)   MCV 90.3  78.0 - 100.0 (fL)   MCH 30.3  26.0 - 34.0 (pg)   MCHC 33.6  30.0 - 36.0 (g/dL)   RDW 91.4  78.2 - 95.6 (%)   Platelets 103 (*) 150 - 400 (K/uL)    Studies/Results: Ct Abdomen Pelvis W Contrast  02/14/2012  *RADIOLOGY REPORT*  Clinical Data: Mid to lower abdominal pain.  Nausea.  CT ABDOMEN AND PELVIS WITH CONTRAST  Technique:  Multidetector CT imaging of the abdomen and pelvis was performed following the standard protocol during bolus administration of intravenous contrast.  Contrast: 100 ml Omnipaque-300  Comparison: 06/30/2007  Findings:  Breathing motion artifact mildly obscures a portion of the liver, spleen, and pancreas.  No discrete abnormality of the visualized portion of the liver, spleen, pancreas, or adrenal glands noted.  Right kidney upper pole exophytic cyst noted.  The kidneys appear otherwise unremarkable.  Infrarenal abdominal aortic aneurysm measures 4 cm in diameter.  Splenic artery atherosclerotic calcification noted.  The gallbladder surgically absent.  No pathologic retroperitoneal or porta hepatis adenopathy is identified.  The appendix appears normal.  Aortoiliac atherosclerotic calcification noted.  Borderline wall thickening noted along the dome of the urinary bladder, correlate with urine analysis in assessing for cystitis. Central prostate calcifications noted.  Mild acetabular spurring noted bilaterally with degenerative subcortical cyst formation in the left acetabulum.  IMPRESSION: 1.  Infrarenal fusiform abdominal aortic aneurysm, 4 cm. 2.   Slight wall thickening along the dome of the urinary bladder, correlate with urine analysis in assessing for cystitis. 3.  Upper abdominal structures are mildly obscured by breathing motion artifact. 4.  Simple-appearing right kidney upper pole cyst. 5.  Aortoiliac atherosclerotic calcification. 6.  Degenerative arthropathy of the hips bilaterally.  Original Report Authenticated By: Dellia Cloud, M.D.    Medications:    . antiseptic oral rinse  15 mL Mouth Rinse BID  . atorvastatin  40 mg Oral q1800  . divalproex  1,000 mg Oral QHS  . divalproex  500 mg Oral Daily  .  hydrocortisone  25 mg Rectal BID  . pantoprazole  40 mg Oral Q0600  . QUEtiapine  100 mg Oral QHS  . sodium chloride  3 mL Intravenous Q12H  . Tamsulosin HCl  0.4 mg Oral BID  . venlafaxine  75-150 mg Oral BID    acetaminophen, acetaminophen, clonazePAM, dicyclomine, ondansetron (ZOFRAN) IV, ondansetron, traZODone     . sodium chloride 20 mL/hr at 02/15/12 1851    Assessment/Plan:  #1.  GI bleed :  Stable, appreciate GI input. No plans for colonoscopy. Continue Anusol-HC and PPI. #2. Acute renal failure/CKD 3; baseline creatinine 1.3-1.5, stable  #3. History of CAD status post CABG - denies any chest pain. Resume Plavix  #4. History of seizures - on Depakote. Showing worsening thrombocytopenia, spoke to neurology will switch to Keppra, will give 1500 mg IV x1, then continue with 500 mg twice a day. overlap with Depakote for one day as per neurology(Dr Lindzen) recommendations. Keppra can can be titrated as an outpatient if needed . #5. Abdominal aortic aneurysm 4 cm in size to be followed as outpatient.  #6. History of diabetes ,not on any antidiabetic medications, check A1c. 2 to monitor CBGs. #7. Thrombocytopenia, probably secondary to Depakote, see above.   LOS: 2 days   Caitlynn Ju 02/16/2012, 2:59 PM

## 2012-02-16 NOTE — Progress Notes (Signed)
No further rectal bleeding or stools. Feels well.      02/16/12 0506  BP: 144/65  Pulse: 67  Temp: 98 F (36.7 C)  Resp: 18        Component Value Date/Time   WBC 4.3 02/16/2012 0506   RBC 4.12* 02/16/2012 0506   HGB 12.5* 02/16/2012 0506   HCT 37.2* 02/16/2012 0506   PLT 103* 02/16/2012 0506   MCV 90.3 02/16/2012 0506   IMPRESSION:  * Acute on chronic rectal bleeding in pt on chronic Plavix. Limited to 2 episodes yesterday.  BLEEDING HEMORRHOIDS IS CURRENT DIAGNOSIS * Hx colon polyps around 2003, none on 2009 Colonoscopy  * Hx microcytic anemia. Not anemic currently  *  Hx duodenitis, possible duodenal ulcer in 2010 with GI bleed   * Pneumonia, seizure admitted 3/4-12/09/11. Extensive neuro workup did not support dx of new/recurrent CVA. Depakote dose adjusted upward.  * Severe carotid stenosis, maintained on chronic Plavix.  * DM 2  PLAN No plans for colonoscopy or flex sig.  He can go home today, GI will sign off. Continue the [facility] per rectum for one week, then prn if recurrent bleeding.  Could use cream or suppositories, per pt preference.  Stool softeners and/or prn to avoid hard stools and straining.  Ok to restart Plavix.  Would continue a PPI, generic omeprazole 40 mg daily would be fine,  given hx of possible duodenal ulcer and chronic Plavix.  Can follow up with primary MD. GI, Dr Leone Payor, appt on chart for June.   Agree with Ms. Heron Nay assessment and plan. Iva Boop, MD, [facility]

## 2012-02-16 NOTE — Progress Notes (Signed)
UR Completed. Ryenne Lynam, RN, Nurse Case Manager [phone]     

## 2012-02-17 ENCOUNTER — Telehealth: Payer: Self-pay | Admitting: Family Medicine

## 2012-02-17 DIAGNOSIS — I2510 Atherosclerotic heart disease of native coronary artery without angina pectoris: Secondary | ICD-10-CM

## 2012-02-17 DIAGNOSIS — M311 Thrombotic microangiopathy: Secondary | ICD-10-CM

## 2012-02-17 DIAGNOSIS — K922 Gastrointestinal hemorrhage, unspecified: Secondary | ICD-10-CM

## 2012-02-17 DIAGNOSIS — G40309 Generalized idiopathic epilepsy and epileptic syndromes, not intractable, without status epilepticus: Secondary | ICD-10-CM

## 2012-02-17 LAB — HEMOGLOBIN A1C: Mean Plasma Glucose: 134 mg/dL — ABNORMAL HIGH (ref <117)

## 2012-02-17 LAB — BASIC METABOLIC PANEL
BUN: 22 mg/dL (ref 6–23)
CO2: 25 mEq/L (ref 19–32)
Chloride: 105 mEq/L (ref 96–112)
GFR calc non Af Amer: 50 mL/min — ABNORMAL LOW (ref >90)
Glucose, Bld: 106 mg/dL — ABNORMAL HIGH (ref 70–99)
Potassium: 4.5 mEq/L (ref 3.5–5.1)
Sodium: 141 mEq/L (ref 135–145)

## 2012-02-17 LAB — CBC
HCT: 37.4 % — ABNORMAL LOW (ref 39.0–52.0)
Hemoglobin: 12.4 g/dL — ABNORMAL LOW (ref 13.0–17.0)
MCHC: 33.2 g/dL (ref 30.0–36.0)
RBC: 4.16 MIL/uL — ABNORMAL LOW (ref 4.22–5.81)

## 2012-02-17 MED ORDER — HYDROCORTISONE ACETATE 25 MG RE SUPP: 25.0000 mg | Freq: Two times a day (BID) | RECTAL | Status: AC

## 2012-02-17 MED ORDER — LEVETIRACETAM 500 MG PO TABS: 500.0000 mg | ORAL_TABLET | Freq: Two times a day (BID) | ORAL | Status: DC

## 2012-02-17 MED ORDER — SENNA-DOCUSATE SODIUM 8.6-50 MG PO TABS: 1.0000 | ORAL_TABLET | Freq: Every day | ORAL | Status: DC | PRN

## 2012-02-17 MED ORDER — OMEPRAZOLE 40 MG PO CPDR: 40.0000 mg | DELAYED_RELEASE_CAPSULE | Freq: Every day | ORAL | Status: DC

## 2012-02-17 MED ORDER — DICYCLOMINE HCL 20 MG PO TABS: 20.0000 mg | ORAL_TABLET | Freq: Four times a day (QID) | ORAL | Status: DC | PRN

## 2012-02-17 MED ORDER — DICYCLOMINE HCL 20 MG PO TABS
20.0000 mg | ORAL_TABLET | Freq: Four times a day (QID) | ORAL | Status: DC | PRN
  Filled 2012-02-17: qty 1

## 2012-02-17 NOTE — Progress Notes (Signed)
UR Completed. Tera Mater, RN, Nurse Case Manager [phone],

## 2012-02-17 NOTE — Telephone Encounter (Signed)
Please advise      KP 

## 2012-02-17 NOTE — Discharge Summary (Signed)
Patient ID: COLTAN SPINELLO MRN: [id number] DOB/AGE: 1946/04/28 66 y.o.  Admit date: 02/14/2012 Discharge date: 02/17/2012  Primary Care Physician:  Loreen Freud, DO, DO   Discharge Diagnoses:    Present on Admission:  .Hemorrhoids, internal, with bleeding .CVA .CAD (coronary artery disease) .Seizure disorder .IBS (irritable bowel syndrome) THROMBOCYTOPENIA   Medication List  As of 02/17/2012 10:23 AM   STOP taking these medications         divalproex 500 MG DR tablet         TAKE these medications         clonazePAM 0.5 MG tablet   Commonly known as: KLONOPIN   Take 1 mg by mouth 2 (two) times daily as needed. For anxiety      clopidogrel 75 MG tablet   Commonly known as: PLAVIX   Take 75 mg by mouth daily.      CYANOCOBALAMIN IJ   Inject 1,000 mg as directed every 30 (thirty) days. On the 2nd of the month. Patient states he has taken 30 days ago not sure which date.      dicyclomine 20 MG tablet   Commonly known as: BENTYL   Take 1 tablet (20 mg total) by mouth every 6 (six) hours as needed (abdominal cramps and diarrhea).      FLOMAX 0.4 MG Caps   Generic drug: Tamsulosin HCl   Take 0.4 mg by mouth 2 (two) times daily.      hydrocortisone 25 MG suppository   Commonly known as: ANUSOL-HC   Place 1 suppository (25 mg total) rectally 2 (two) times daily. Bid For one week then as needed for hemorrhoids      LACTOBACILLUS PO   Take 2 capsules by mouth 2 (two) times daily.      levETIRAcetam 500 MG tablet   Commonly known as: KEPPRA   Take 1 tablet (500 mg total) by mouth 2 (two) times daily.      omeprazole 40 MG capsule   Commonly known as: PRILOSEC   Take 1 capsule (40 mg total) by mouth daily.      QUEtiapine 200 MG tablet   Commonly known as: SEROQUEL   Take 100 mg by mouth at bedtime.      sennosides-docusate sodium 8.6-50 MG tablet   Commonly known as: SENOKOT-S   Take 1 tablet by mouth daily as needed for constipation.      simvastatin 80 MG  tablet   Commonly known as: ZOCOR   Take 40 mg by mouth at bedtime.      traZODone 50 MG tablet   Commonly known as: DESYREL   Take 1 tablet (50 mg total) by mouth at bedtime as needed for sleep.      venlafaxine 75 MG tablet   Commonly known as: EFFEXOR   Take 75-150 mg by mouth 2 (two) times daily. He takes 2 150mg  tablets in the morning and 1 75 mg at bedtime.             Consults:  Dr Baldo Ash Gessner/GI    Significant Diagnostic Studies:  Ct Abdomen Pelvis W Contrast  02/14/2012  *RADIOLOGY REPORT*  Clinical Data: Mid to lower abdominal pain.  Nausea.  CT ABDOMEN AND PELVIS WITH CONTRAST  Technique:  Multidetector CT imaging of the abdomen and pelvis was performed following the standard protocol during bolus administration of intravenous contrast.  Contrast: 100 ml Omnipaque-300  Comparison: 06/30/2007  Findings: Breathing motion artifact mildly obscures a portion of the liver, spleen,  and pancreas.  No discrete abnormality of the visualized portion of the liver, spleen, pancreas, or adrenal glands noted.  Right kidney upper pole exophytic cyst noted.  The kidneys appear otherwise unremarkable.  Infrarenal abdominal aortic aneurysm measures 4 cm in diameter.  Splenic artery atherosclerotic calcification noted.  The gallbladder surgically absent.  No pathologic retroperitoneal or porta hepatis adenopathy is identified.  The appendix appears normal.  Aortoiliac atherosclerotic calcification noted.  Borderline wall thickening noted along the dome of the urinary bladder, correlate with urine analysis in assessing for cystitis. Central prostate calcifications noted.  Mild acetabular spurring noted bilaterally with degenerative subcortical cyst formation in the left acetabulum.  IMPRESSION: 1.  Infrarenal fusiform abdominal aortic aneurysm, 4 cm. 2.   Slight wall thickening along the dome of the urinary bladder, correlate with urine analysis in assessing for cystitis. 3.  Upper abdominal structures  are mildly obscured by breathing motion artifact. 4.  Simple-appearing right kidney upper pole cyst. 5.  Aortoiliac atherosclerotic calcification. 6.  Degenerative arthropathy of the hips bilaterally.  Original Report Authenticated By: Dellia Cloud, M.D.    Brief H and P: For complete details please refer to admission H and P, but in brief  66 year-old male with known history of CAD status post CABG,, hypertension, recent admission for seizures and pneumonia in March 2013 and history of CVA presented because of having rectal bleeding. Patient states he had one episode yesterday morning followed by another episode after known both of which were heavy frank bleeding per rectum. Denies any dizziness or loss of consciousness. In the ER patient was found to have guaiac-positive patient was found to be hemodynamically stable. Patient did not complain of any nausea vomiting or abdominal pain denies any diarrhea. Patient states he had a similar episode 2-3 years ago and was transfused 4 units PRBC. Reviewing his chart patient had colonoscopy in 2009 which showed internal hemorrhoids and EGD in 2009 and 2010. EGD in 2010 showed duodenal ulcer. Patient at this time admitted for GI bleed most likely source is lower GI.   Hospital Course:  #1. GI bleed :  Patient was admitted ,serial CBCs were monitored ,plavix was held ,patient was evaluated by   GI  And bleeding was thought to be due to hemoroids   . No plans for colonoscopy. Patient was started on  Lifescape and PPI. Dicyclomine was also ordered as per GI recommendations prn for abdominal cramps or diarrhea  #2. Acute renal failure/CKD 3; baseline creatinine 1.3-1.5, renal function remained stable  #3. History of CAD status post CABG - denies any chest pain.  Plavix was initially held ,now resumed  #4. History of seizures - Patient was on Depakote ,labs Showed worsening thrombocytopenia, spoke to neurology who recommended to  switch to Keppra  500 mg  twice a day. Keppra can can be titrated as an outpatient if needed .  #5. Abdominal aortic aneurysm 4 cm in size to be followed as outpatient.  #6. History of diabetes ,not on any antidiabetic medications, HbA1c. Is pending ,please follow as outpatient. CBGs.ranged between 106-117,advised to be on carb modified diet  #7. Thrombocytopenia, probably secondary to Depakote, see above.platlet count now improving from a drop to 103 to 113 today ,will arrange for [facility] to repeat CBC on Monday 5/20 and send result to PCP for f/u on platelets count.further W/U can be done as outpatient if needed. #8.Depression:Patient is on Effexor ,pharmacisit recommending to switch to another agent because it lowers seizure threshold,will defer  to PCP.  Subjective: Patient seen and examined, denies any recurrence of rectal bleeding or any other complaints.   Filed Vitals:   02/17/12 0509  BP: 118/69  Pulse: 70  Temp: 97.9 F (36.6 C)  Resp: 18    General: Alert, awake, oriented x3, in no acute distress. HEENT: No bruits, no goiter. Heart: Regular rate and rhythm, without murmurs, rubs, gallops. Lungs: Clear to auscultation bilaterally. Abdomen: Soft, nontender, nondistended, positive bowel sounds. Extremities: No clubbing cyanosis or edema with positive pedal pulses. Neuro: Grossly intact, nonfocal.   Disposition and Follow-up:  TO HOME  Follow with PCP ASAP  and GI as arranged    Time spent on Discharge: Approximately 45 minutes   Signed: Kamile Fassler 02/17/2012, 10:23 AM

## 2012-02-17 NOTE — Telephone Encounter (Signed)
You can use one of the appts for tomorrow.      KP

## 2012-02-17 NOTE — Telephone Encounter (Signed)
[name], a case Production designer, theatre/television/film at [facility], called to schedule this pt for a post hospital follow-up and blood work. The pt has not been seen by Dr. Laury Axon since 10/2008. Will she see him for this or would he be considered a new patient? Call back # [phone]

## 2012-02-17 NOTE — Progress Notes (Signed)
Nursing Note: Pt to be discharged per MD's order. IV and cardiac monitor discontinued per MD's order. Pt and family received all discharge information and verbalized understanding. Will escort pt to car safely. Hitoshi Werts Scientist, clinical (histocompatibility and immunogenetics).

## 2012-02-17 NOTE — Telephone Encounter (Signed)
Either way its 30 min-- he would be a new pt

## 2012-02-18 ENCOUNTER — Ambulatory Visit (INDEPENDENT_AMBULATORY_CARE_PROVIDER_SITE_OTHER): Payer: Federal, State, Local not specified - PPO | Admitting: Family Medicine

## 2012-02-18 ENCOUNTER — Encounter: Payer: Self-pay | Admitting: Neurology

## 2012-02-18 ENCOUNTER — Encounter: Payer: Self-pay | Admitting: Family Medicine

## 2012-02-18 VITALS — BP 140/70 | HR 72 | Temp 98.3°F | Ht 70.0 in | Wt 187.4 lb

## 2012-02-18 DIAGNOSIS — E119 Type 2 diabetes mellitus without complications: Secondary | ICD-10-CM

## 2012-02-18 DIAGNOSIS — R569 Unspecified convulsions: Secondary | ICD-10-CM

## 2012-02-18 DIAGNOSIS — I6529 Occlusion and stenosis of unspecified carotid artery: Secondary | ICD-10-CM

## 2012-02-18 DIAGNOSIS — D696 Thrombocytopenia, unspecified: Secondary | ICD-10-CM

## 2012-02-18 DIAGNOSIS — E785 Hyperlipidemia, unspecified: Secondary | ICD-10-CM

## 2012-02-18 DIAGNOSIS — R197 Diarrhea, unspecified: Secondary | ICD-10-CM

## 2012-02-18 DIAGNOSIS — F4310 Post-traumatic stress disorder, unspecified: Secondary | ICD-10-CM

## 2012-02-18 DIAGNOSIS — K648 Other hemorrhoids: Secondary | ICD-10-CM

## 2012-02-18 DIAGNOSIS — I6350 Cerebral infarction due to unspecified occlusion or stenosis of unspecified cerebral artery: Secondary | ICD-10-CM

## 2012-02-18 DIAGNOSIS — Z860100 Personal history of colon polyps, unspecified: Secondary | ICD-10-CM

## 2012-02-18 DIAGNOSIS — I10 Essential (primary) hypertension: Secondary | ICD-10-CM

## 2012-02-18 DIAGNOSIS — I2510 Atherosclerotic heart disease of native coronary artery without angina pectoris: Secondary | ICD-10-CM

## 2012-02-18 DIAGNOSIS — I639 Cerebral infarction, unspecified: Secondary | ICD-10-CM

## 2012-02-18 DIAGNOSIS — G40909 Epilepsy, unspecified, not intractable, without status epilepticus: Secondary | ICD-10-CM

## 2012-02-18 LAB — POCT URINALYSIS DIPSTICK
Bilirubin, UA: NEGATIVE
Blood, UA: NEGATIVE
Ketones, UA: NEGATIVE
Nitrite, UA: NEGATIVE
Spec Grav, UA: >=1.03
pH, UA: 6

## 2012-02-18 LAB — BASIC METABOLIC PANEL
Calcium: 8.6 mg/dL (ref 8.4–10.5)
Creatinine, Ser: 1.2 mg/dL (ref 0.4–1.5)

## 2012-02-18 LAB — CBC WITH DIFFERENTIAL/PLATELET
Basophils Relative: 0.4 % (ref 0.0–3.0)
Eosinophils Absolute: 0.1 10*3/uL (ref 0.0–0.7)
Eosinophils Relative: 2.7 % (ref 0.0–5.0)
Lymphocytes Relative: 25.3 % (ref 12.0–46.0)
MCHC: 33.3 g/dL (ref 30.0–36.0)
Neutrophils Relative %: 59.8 % (ref 43.0–77.0)
RBC: 4.19 Mil/uL — ABNORMAL LOW (ref 4.22–5.81)
WBC: 5.4 10*3/uL (ref 4.5–10.5)

## 2012-02-18 LAB — LIPID PANEL
HDL: 38.6 mg/dL — ABNORMAL LOW (ref >39.00)
LDL Cholesterol: 66 mg/dL (ref 0–99)
Total CHOL/HDL Ratio: 3

## 2012-02-18 LAB — HEPATIC FUNCTION PANEL
Alkaline Phosphatase: 47 U/L (ref 39–117)
Bilirubin, Direct: 0 mg/dL (ref 0.0–0.3)
Total Protein: 6.3 g/dL (ref 6.0–8.3)

## 2012-02-18 LAB — HEMOGLOBIN A1C: Hgb A1c MFr Bld: 6.1 % (ref 4.6–6.5)

## 2012-02-18 LAB — MICROALBUMIN / CREATININE URINE RATIO: Microalb Creat Ratio: 4.1 mg/g (ref 0.0–30.0)

## 2012-02-18 NOTE — Patient Instructions (Addendum)
Diarrhea Infections caused by germs (bacterial) or a virus commonly cause diarrhea. Your caregiver has determined that with time, rest and fluids, the diarrhea should improve. In general, eat normally while drinking more water than usual. Although water may prevent dehydration, it does not contain salt and minerals (electrolytes). Broths, weak tea without caffeine and oral rehydration solutions (ORS) replace fluids and electrolytes. Small amounts of fluids should be taken frequently. Large amounts at one time may not be tolerated. Plain water may be harmful in infants and the elderly. Oral rehydrating solutions (ORS) are available at pharmacies and grocery stores. ORS replace water and important electrolytes in proper proportions. Sports drinks are not as effective as ORS and may be harmful due to sugars worsening diarrhea.  ORS is especially recommended for use in children with diarrhea. As a general guideline for children, replace any new fluid losses from diarrhea and/or vomiting with ORS as follows:   If your child weighs 22 pounds or under (10 kg or less), give 60-120 mL ( -  cup or 2 - 4 ounces) of ORS for each episode of diarrheal stool or vomiting episode.   If your child weighs more than 22 pounds (more than 10 kgs), give 120-240 mL ( - 1 cup or 4 - 8 ounces) of ORS for each diarrheal stool or episode of vomiting.   While correcting for dehydration, children should eat normally. However, foods high in sugar should be avoided because this may worsen diarrhea. Large amounts of carbonated soft drinks, juice, gelatin desserts and other highly sugared drinks should be avoided.   After correction of dehydration, other liquids that are appealing to the child may be added. Children should drink small amounts of fluids frequently and fluids should be increased as tolerated. Children should drink enough fluids to keep urine clear or pale yellow.   Adults should eat normally while drinking more fluids  than usual. Drink small amounts of fluids frequently and increase as tolerated. Drink enough fluids to keep urine clear or pale yellow. Broths, weak decaffeinated tea, lemon lime soft drinks (allowed to go flat) and ORS replace fluids and electrolytes.   Avoid:   Carbonated drinks.   Juice.   Extremely hot or cold fluids.   Caffeine drinks.   Fatty, greasy foods.   Alcohol.   Tobacco.   Too much intake of anything at one time.   Gelatin desserts.   Probiotics are active cultures of beneficial bacteria. They may lessen the amount and number of diarrheal stools in adults. Probiotics can be found in yogurt with active cultures and in supplements.   Wash hands well to avoid spreading bacteria and virus.   Anti-diarrheal medications are not recommended for infants and children.   Only take over-the-counter or prescription medicines for pain, discomfort or fever as directed by your caregiver. Do not give aspirin to children because it may cause Reye's Syndrome.   For adults, ask your caregiver if you should continue all prescribed and over-the-counter medicines.   If your caregiver has given you a follow-up appointment, it is very important to keep that appointment. Not keeping the appointment could result in a chronic or permanent injury, and disability. If there is any problem keeping the appointment, you must call back to this facility for assistance.  SEEK IMMEDIATE MEDICAL CARE IF:   You or your child is unable to keep fluids down or other symptoms or problems become worse in spite of treatment.   Vomiting or diarrhea develops and becomes persistent.     There is vomiting of blood or bile (green material).   There is blood in the stool or the stools are black and tarry.   There is no urine output in 6-8 hours or there is only a small amount of very dark urine.   Abdominal pain develops, increases or localizes.   You have a fever.   Your baby is older than 3 months with a  rectal temperature of 102 F (38.9 C) or higher.   Your baby is 8 months old or younger with a rectal temperature of 100.4 F (38 C) or higher.   You or your child develops excessive weakness, dizziness, fainting or extreme thirst.   You or your child develops a rash, stiff neck, severe headache or become irritable or sleepy and difficult to awaken.  MAKE SURE YOU:   Understand these instructions.   Will watch your condition.   Will get help right away if you are not doing well or get worse.  Document Released: 09/10/2002 Document Revised: 09/09/2011 Document Reviewed: 07/28/2009 [facility] Patient Information 2012 [place], [place].  Post-Traumatic Stress Disorder If you have been diagnosed with post-traumatic stress disorder (PTSD), you have probably experienced a traumatic event in your life. These events are usually outside of the range of normal human experience and would negatively impact any normal person.  CAUSES  A person can get PTSD after living through or seeing a dangerous event such as:  An automobile accident.   War.   Natural disaster.   Rape.   Domestic violence.   Any event where there has been a threat to life.  PTSD is a real illness. PTSD Can happen to anyone at any age. Children get PTSD too. A doctor, or mental health professional with experience in treating PTSD can help you. SYMPTOMS  Not all symptoms may be present in any one person.  Distressing dreams.   Flashback: feeling the frightening event is happening again.   Avoiding activities, places, and people that remind you of the event.   Avoiding thoughts and feelings associated with the event.   Having frightening thoughts you cannot control.   Feeling on the edge with increased alertness and vigilance.   Trouble sleeping.   Feeling alone, detached from others.   Angry outbursts.   Feeling worried, guilty, or sad.   Having thoughts of hurting yourself or others.  PTSD may start soon  after a frightening event or months or years later. Many war veterans have PTSD. Drinking alcohol or using drugs will not help PTSD and may even make it worse.  TREATMENT  PTSD can be treated. Treatment may include "talk" therapy, medicine, or both. Either a doctor or a mental health professional who is experienced in treating PTSD can help you. Early diagnosis and treatment is best and can show more rapid improvement. Get help if you or a loved one are thinking of hurting yourself. Call your local emergency medical services if you need help immediately. Document Released: 06/15/2001 Document Revised: 09/09/2011 Document Reviewed: 05/29/2008 [facility] Patient Information 2012 [place], [place].

## 2012-02-18 NOTE — Telephone Encounter (Signed)
appointment made today at 11:15

## 2012-02-20 ENCOUNTER — Encounter: Payer: Self-pay | Admitting: Family Medicine

## 2012-02-20 NOTE — Assessment & Plan Note (Signed)
Per GI

## 2012-02-20 NOTE — Assessment & Plan Note (Signed)
Per cardio 

## 2012-02-20 NOTE — Assessment & Plan Note (Signed)
prob secondary to depakote----- changed to Keppra in hospital F/u neuro

## 2012-02-20 NOTE — Assessment & Plan Note (Signed)
F/u neuro  

## 2012-02-20 NOTE — Assessment & Plan Note (Signed)
No further bleeding in hospital F/u GI

## 2012-02-20 NOTE — Assessment & Plan Note (Signed)
Check labs 

## 2012-02-20 NOTE — Assessment & Plan Note (Signed)
stable °

## 2012-02-20 NOTE — Assessment & Plan Note (Signed)
Pt sees cardio con't meds

## 2012-02-20 NOTE — Assessment & Plan Note (Signed)
Pt wife given list of names of psych-----she goes to Dr Evelene Croon so she will see if Dr Evelene Croon is willing to she pt as well

## 2012-02-20 NOTE — Progress Notes (Signed)
  Subjective:    Patient ID: [name], male    DOB: 08/22/1946, 66 y.o.   MRN: [id number]  HPI Pt here with his wife to reestablish and f/u from hospital ----cva, seizures and ptsd.  Pt was being seen at the [place].  Pt needs referrals to specialists outside the [place].  Pt wife states his is impossible to live with .  He forgets things , can't drive because he will get lost and now has seizures.  Pt has had no seizures since being d/c'd.      Review of Systems As above    Objective:   Physical Exam  Constitutional: He is oriented to person, place, and time. He appears well-developed and well-nourished. No distress.  Cardiovascular: Normal rate and regular rhythm.   Pulmonary/Chest: Effort normal and breath sounds normal. No respiratory distress. He has no wheezes. He has no rales. He exhibits no tenderness.  Neurological: He is alert and oriented to person, place, and time.  Psychiatric: He has a normal mood and affect. His behavior is normal. Judgment and thought content normal.          Assessment & Plan:

## 2012-02-20 NOTE — Assessment & Plan Note (Signed)
No evidence of acute episode this admission

## 2012-02-21 ENCOUNTER — Ambulatory Visit: Payer: Medicare Other | Admitting: Family Medicine

## 2012-03-02 ENCOUNTER — Telehealth: Payer: Self-pay | Admitting: Family Medicine

## 2012-03-02 NOTE — Telephone Encounter (Signed)
In reference to Psychiatric Referral, per patient's request, he wanted to find someone who will take his Medicaid.  The ONLY place he could be seen w/medicaid is his local [facility], unless he is willing to be seen & just use the [employer], which he is not.  Patient states he will continue to see his current psych at the [place], and will get back with me if anything changes.

## 2012-03-03 LAB — CLOSTRIDIUM DIFFICILE EIA: CDIFTX: NEGATIVE

## 2012-03-07 LAB — FECAL OCCULT BLOOD, IMMUNOCHEMICAL: Fecal Occult Bld: POSITIVE

## 2012-03-09 NOTE — Telephone Encounter (Signed)
I want to be sure Dr. Laury Axon is aware of patient's decision.

## 2012-03-09 NOTE — Telephone Encounter (Signed)
To Dr.Lowne for review.     KP

## 2012-03-09 NOTE — Telephone Encounter (Signed)
noted 

## 2012-03-14 DIAGNOSIS — R195 Other fecal abnormalities: Secondary | ICD-10-CM

## 2012-03-16 ENCOUNTER — Other Ambulatory Visit (INDEPENDENT_AMBULATORY_CARE_PROVIDER_SITE_OTHER): Payer: Federal, State, Local not specified - PPO

## 2012-03-16 ENCOUNTER — Ambulatory Visit (INDEPENDENT_AMBULATORY_CARE_PROVIDER_SITE_OTHER): Payer: Federal, State, Local not specified - PPO | Admitting: Internal Medicine

## 2012-03-16 ENCOUNTER — Encounter: Payer: Self-pay | Admitting: Internal Medicine

## 2012-03-16 VITALS — BP 100/50 | HR 88 | Wt 191.4 lb

## 2012-03-16 DIAGNOSIS — D649 Anemia, unspecified: Secondary | ICD-10-CM

## 2012-03-16 DIAGNOSIS — K648 Other hemorrhoids: Secondary | ICD-10-CM

## 2012-03-16 DIAGNOSIS — K589 Irritable bowel syndrome without diarrhea: Secondary | ICD-10-CM

## 2012-03-16 LAB — CBC WITH DIFFERENTIAL/PLATELET
Eosinophils Relative: 3.1 % (ref 0.0–5.0)
HCT: 37.5 % — ABNORMAL LOW (ref 39.0–52.0)
Hemoglobin: 12.5 g/dL — ABNORMAL LOW (ref 13.0–17.0)
Lymphocytes Relative: 26.8 % (ref 12.0–46.0)
Lymphs Abs: 1.6 10*3/uL (ref 0.7–4.0)
Monocytes Relative: 15.5 % — ABNORMAL HIGH (ref 3.0–12.0)
Neutro Abs: 3.3 10*3/uL (ref 1.4–7.7)
RBC: 4.12 Mil/uL — ABNORMAL LOW (ref 4.22–5.81)
WBC: 6.1 10*3/uL (ref 4.5–10.5)

## 2012-03-16 MED ORDER — HYDROCORTISONE 2.5 % RE CREA: TOPICAL_CREAM | Freq: Two times a day (BID) | RECTAL | Status: AC | PRN

## 2012-03-16 MED ORDER — DIPHENOXYLATE-ATROPINE 2.5-0.025 MG PO TABS: 1.0000 | ORAL_TABLET | Freq: Four times a day (QID) | ORAL | Status: DC | PRN

## 2012-03-16 NOTE — Progress Notes (Signed)
Quick Note:  Hgb same - ok - no change in plans to treat hemorrhoids with hydrocortisone and diarrhea with Lomotil ______

## 2012-03-16 NOTE — Patient Instructions (Addendum)
Your physician has requested that you go to the basement for the following lab work before leaving today: CBC  We have sent the following medications to your pharmacy for you to pick up at your convenience: Anusol and a written RX for Lomotil

## 2012-03-16 NOTE — Progress Notes (Signed)
  Subjective:    Patient ID: [name], male    DOB: 11/10/1945, 66 y.o.   MRN: [id number]  HPI This 66 year old married white man is here with his wife today. He was seen in nostril when he was admitted with rectal bleeding thought was hemorrhoidal. He had a slight decline in his hemoglobin to the 12 range. He has not had much bleeding since then though he has had chronic intermittent bright red blood per time. He also has problems with intermittent episodic urgent diarrhea that has not helped by Imodium. He could go a month or more without that but frequently when he eats out she will have this problem. He and his wife corroborate. There is also intermittent mild crampy left lower quadrant pain.  He was found to have a Hemoccult-positive stool on outpatient Hemoccult testing and was sent for evaluation.  His wife reports that she is not currently living with him though she visits him. There have been problems with dealing with his emotional lability and PTSD problems.  Medications, allergies, past medical history, past surgical history, family history and social history are reviewed and updated in the EMR.   Review of Systems Continues with memory disturbance and PTSD problems    Objective:   Physical Exam General:  NAD Eyes:   anicteric Lungs:  clear Heart:  S1S2 no rubs, murmurs or gallops Abdomen:  soft and nontender, BS+ Ext:   no edema Rectum: Small skin tags, digital rectal exam with mild anal stenosis no mass. Prostate normal. Anoscopy: Swollen and inflamed erythematous internal hemorrhoids in the anal canal    Data Reviewed:  Lab Results  Component Value Date   WBC 6.1 03/16/2012   HGB 12.5* 03/16/2012   HCT 37.5* 03/16/2012   MCV 91.0 03/16/2012   PLT 117.0* 03/16/2012        Assessment & Plan:   1. Bleeding internal hemorrhoids   2. Anemia, unspecified   3. IBS (irritable bowel syndrome)    His problems are chronic. He is a very trivial anemia which is  stable as reflected in a CBC report above. He had a colonoscopy in 2009 with no major problems though he didn't internal hemorrhoids. Will start hydrocortisone cream, have prescribed Lomotil to try to help this episodic diarrhea. If he has persistent or worsening problems consider hemorrhoids ligation. I do not think he needs a colonoscopy at this time as I think heme positive stool can be explained by the hemorrhoids. I would avoid doing Hemoccults and the future as there likely to be false positive from hemorrhoids. Should he develop a significant anemia problem then we could reconsider endoscopy evaluation.  CC: Loreen Freud, DO

## 2012-04-21 ENCOUNTER — Ambulatory Visit: Payer: Medicare Other | Admitting: Neurology

## 2012-04-29 IMAGING — CR DG CHEST 1V PORT
1 series · 1 of 1 positions shown · non-contrast
Comparison: 10/14/2008

CLINICAL DATA: Mid left chest pain.  Abdominal pain

PORTABLE CHEST - 1 VIEW

[AP]
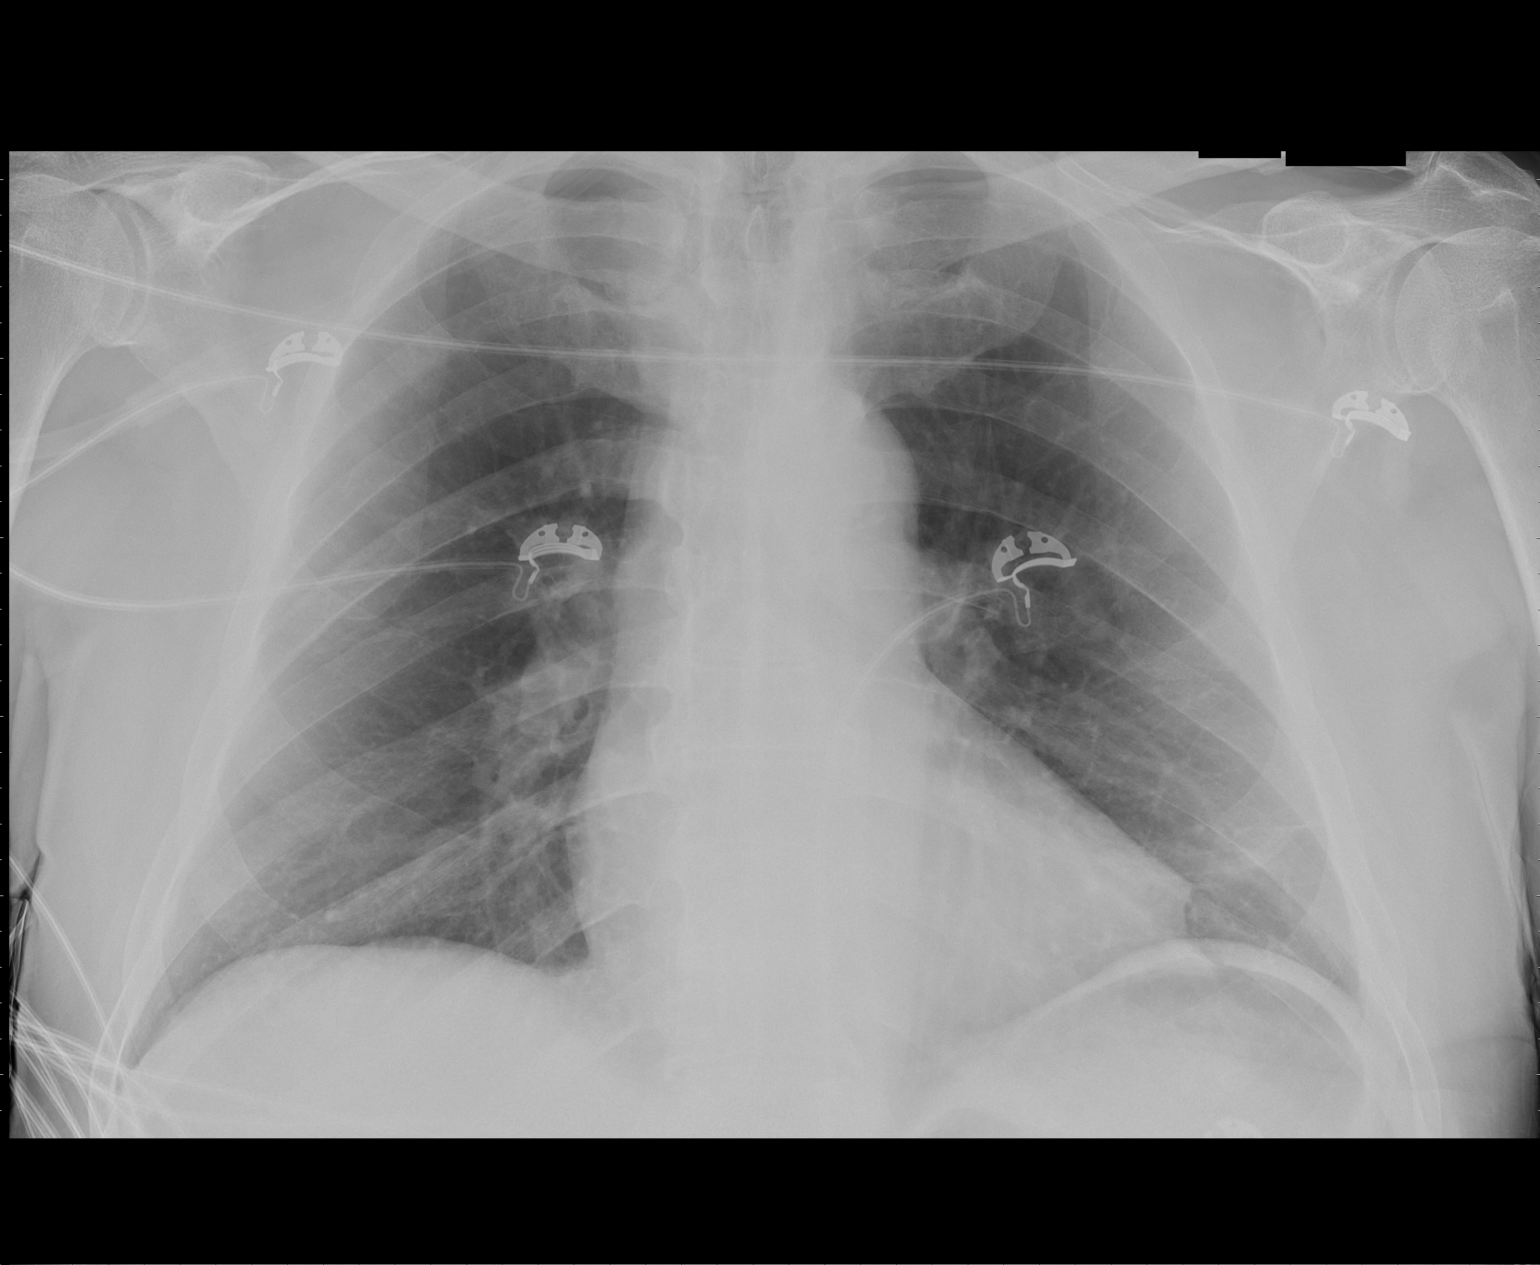

[1 of 1 positions shown; findings below may reference images not displayed]

FINDINGS: Heart size is normal.  Negative for heart failure
pneumonia.  Lungs are clear.
IMPRESSION: No acute abnormality.

## 2012-05-24 ENCOUNTER — Emergency Department (HOSPITAL_COMMUNITY): Payer: Medicare Other

## 2012-05-24 ENCOUNTER — Encounter (HOSPITAL_COMMUNITY): Payer: Self-pay | Admitting: Emergency Medicine

## 2012-05-24 ENCOUNTER — Inpatient Hospital Stay (HOSPITAL_COMMUNITY)
Admission: EM | Admit: 2012-05-24 | Discharge: 2012-05-26 | DRG: 071 | Disposition: A | Discharge status: Home / Self Care | Payer: Medicare Other | Attending: Internal Medicine | Admitting: Internal Medicine

## 2012-05-24 DIAGNOSIS — Z860100 Personal history of colon polyps, unspecified: Secondary | ICD-10-CM

## 2012-05-24 DIAGNOSIS — K7689 Other specified diseases of liver: Secondary | ICD-10-CM

## 2012-05-24 DIAGNOSIS — F3289 Other specified depressive episodes: Secondary | ICD-10-CM | POA: Diagnosis present

## 2012-05-24 DIAGNOSIS — Z79899 Other long term (current) drug therapy: Secondary | ICD-10-CM

## 2012-05-24 DIAGNOSIS — R197 Diarrhea, unspecified: Secondary | ICD-10-CM | POA: Diagnosis present

## 2012-05-24 DIAGNOSIS — Z951 Presence of aortocoronary bypass graft: Secondary | ICD-10-CM

## 2012-05-24 DIAGNOSIS — Z7902 Long term (current) use of antithrombotics/antiplatelets: Secondary | ICD-10-CM

## 2012-05-24 DIAGNOSIS — I6350 Cerebral infarction due to unspecified occlusion or stenosis of unspecified cerebral artery: Secondary | ICD-10-CM

## 2012-05-24 DIAGNOSIS — Z9089 Acquired absence of other organs: Secondary | ICD-10-CM

## 2012-05-24 DIAGNOSIS — G9340 Encephalopathy, unspecified: Principal | ICD-10-CM

## 2012-05-24 DIAGNOSIS — N179 Acute kidney failure, unspecified: Secondary | ICD-10-CM

## 2012-05-24 DIAGNOSIS — R4182 Altered mental status, unspecified: Secondary | ICD-10-CM

## 2012-05-24 DIAGNOSIS — I2510 Atherosclerotic heart disease of native coronary artery without angina pectoris: Secondary | ICD-10-CM

## 2012-05-24 DIAGNOSIS — Z87891 Personal history of nicotine dependence: Secondary | ICD-10-CM

## 2012-05-24 DIAGNOSIS — Z85828 Personal history of other malignant neoplasm of skin: Secondary | ICD-10-CM

## 2012-05-24 DIAGNOSIS — I714 Abdominal aortic aneurysm, without rupture, unspecified: Secondary | ICD-10-CM

## 2012-05-24 DIAGNOSIS — G40909 Epilepsy, unspecified, not intractable, without status epilepticus: Secondary | ICD-10-CM

## 2012-05-24 DIAGNOSIS — G8929 Other chronic pain: Secondary | ICD-10-CM | POA: Diagnosis present

## 2012-05-24 DIAGNOSIS — E538 Deficiency of other specified B group vitamins: Secondary | ICD-10-CM | POA: Diagnosis present

## 2012-05-24 DIAGNOSIS — F329 Major depressive disorder, single episode, unspecified: Secondary | ICD-10-CM | POA: Diagnosis present

## 2012-05-24 DIAGNOSIS — E119 Type 2 diabetes mellitus without complications: Secondary | ICD-10-CM

## 2012-05-24 DIAGNOSIS — R5383 Other fatigue: Secondary | ICD-10-CM

## 2012-05-24 DIAGNOSIS — M79609 Pain in unspecified limb: Secondary | ICD-10-CM | POA: Diagnosis present

## 2012-05-24 DIAGNOSIS — I6529 Occlusion and stenosis of unspecified carotid artery: Secondary | ICD-10-CM

## 2012-05-24 DIAGNOSIS — Z87440 Personal history of urinary (tract) infections: Secondary | ICD-10-CM

## 2012-05-24 DIAGNOSIS — N289 Disorder of kidney and ureter, unspecified: Secondary | ICD-10-CM

## 2012-05-24 DIAGNOSIS — R5381 Other malaise: Secondary | ICD-10-CM

## 2012-05-24 DIAGNOSIS — R972 Elevated prostate specific antigen [PSA]: Secondary | ICD-10-CM

## 2012-05-24 DIAGNOSIS — F411 Generalized anxiety disorder: Secondary | ICD-10-CM | POA: Diagnosis present

## 2012-05-24 DIAGNOSIS — E669 Obesity, unspecified: Secondary | ICD-10-CM | POA: Diagnosis present

## 2012-05-24 DIAGNOSIS — R269 Unspecified abnormalities of gait and mobility: Secondary | ICD-10-CM

## 2012-05-24 DIAGNOSIS — I658 Occlusion and stenosis of other precerebral arteries: Secondary | ICD-10-CM | POA: Diagnosis present

## 2012-05-24 DIAGNOSIS — Z8701 Personal history of pneumonia (recurrent): Secondary | ICD-10-CM

## 2012-05-24 DIAGNOSIS — M19049 Primary osteoarthritis, unspecified hand: Secondary | ICD-10-CM | POA: Diagnosis present

## 2012-05-24 DIAGNOSIS — K648 Other hemorrhoids: Secondary | ICD-10-CM

## 2012-05-24 DIAGNOSIS — Z8679 Personal history of other diseases of the circulatory system: Secondary | ICD-10-CM

## 2012-05-24 DIAGNOSIS — F4310 Post-traumatic stress disorder, unspecified: Secondary | ICD-10-CM

## 2012-05-24 DIAGNOSIS — N400 Enlarged prostate without lower urinary tract symptoms: Secondary | ICD-10-CM | POA: Diagnosis present

## 2012-05-24 DIAGNOSIS — E785 Hyperlipidemia, unspecified: Secondary | ICD-10-CM

## 2012-05-24 DIAGNOSIS — K219 Gastro-esophageal reflux disease without esophagitis: Secondary | ICD-10-CM

## 2012-05-24 DIAGNOSIS — Z8673 Personal history of transient ischemic attack (TIA), and cerebral infarction without residual deficits: Secondary | ICD-10-CM

## 2012-05-24 DIAGNOSIS — D696 Thrombocytopenia, unspecified: Secondary | ICD-10-CM

## 2012-05-24 DIAGNOSIS — K589 Irritable bowel syndrome without diarrhea: Secondary | ICD-10-CM

## 2012-05-24 DIAGNOSIS — G2401 Drug induced subacute dyskinesia: Secondary | ICD-10-CM

## 2012-05-24 DIAGNOSIS — I10 Essential (primary) hypertension: Secondary | ICD-10-CM

## 2012-05-24 DIAGNOSIS — S0990XA Unspecified injury of head, initial encounter: Secondary | ICD-10-CM

## 2012-05-24 LAB — URINE MICROSCOPIC-ADD ON

## 2012-05-24 LAB — URINALYSIS, ROUTINE W REFLEX MICROSCOPIC
Bilirubin Urine: NEGATIVE
Glucose, UA: NEGATIVE mg/dL
Ketones, ur: NEGATIVE mg/dL
Leukocytes, UA: NEGATIVE
Protein, ur: 30 mg/dL — AB
pH: 5.5 (ref 5.0–8.0)

## 2012-05-24 LAB — CARDIAC PANEL(CRET KIN+CKTOT+MB+TROPI)
CK, MB: 7.1 ng/mL (ref 0.3–4.0)
Relative Index: 1.6 (ref 0.0–2.5)
Total CK: 441 U/L — ABNORMAL HIGH (ref 7–232)
Troponin I: <0.3 ng/mL (ref <0.30)

## 2012-05-24 LAB — CBC WITH DIFFERENTIAL/PLATELET
Basophils Absolute: 0 10*3/uL (ref 0.0–0.1)
Basophils Relative: 0 % (ref 0–1)
Eosinophils Relative: 2 % (ref 0–5)
HCT: 38 % — ABNORMAL LOW (ref 39.0–52.0)
Hemoglobin: 12.5 g/dL — ABNORMAL LOW (ref 13.0–17.0)
Lymphocytes Relative: 25 % (ref 12–46)
MCHC: 32.9 g/dL (ref 30.0–36.0)
MCV: 92.9 fL (ref 78.0–100.0)
Monocytes Absolute: 0.9 10*3/uL (ref 0.1–1.0)
Monocytes Relative: 14 % — ABNORMAL HIGH (ref 3–12)
RDW: 13.1 % (ref 11.5–15.5)

## 2012-05-24 LAB — BASIC METABOLIC PANEL
BUN: 50 mg/dL — ABNORMAL HIGH (ref 6–23)
CO2: 29 mEq/L (ref 19–32)
Calcium: 10.4 mg/dL (ref 8.4–10.5)
Chloride: 104 mEq/L (ref 96–112)
Creatinine, Ser: 2.6 mg/dL — ABNORMAL HIGH (ref 0.50–1.35)

## 2012-05-24 LAB — HEPATIC FUNCTION PANEL
Bilirubin, Direct: <0.1 mg/dL (ref 0.0–0.3)
Total Bilirubin: 0.2 mg/dL — ABNORMAL LOW (ref 0.3–1.2)

## 2012-05-24 LAB — RAPID URINE DRUG SCREEN, HOSP PERFORMED
Barbiturates: NOT DETECTED
Benzodiazepines: NOT DETECTED
Cocaine: NOT DETECTED
Tetrahydrocannabinol: NOT DETECTED

## 2012-05-24 LAB — AMMONIA: Ammonia: 10 umol/L — ABNORMAL LOW (ref 11–60)

## 2012-05-24 LAB — VALPROIC ACID LEVEL: Valproic Acid Lvl: 25.4 ug/mL — ABNORMAL LOW (ref 50.0–100.0)

## 2012-05-24 MED ORDER — INSULIN ASPART 100 UNIT/ML ~~LOC~~ SOLN
0.0000 [IU] | Freq: Three times a day (TID) | SUBCUTANEOUS | Status: DC
  Administered 2012-05-25: 2 [IU] via SUBCUTANEOUS
  Administered 2012-05-26: 1 [IU] via SUBCUTANEOUS

## 2012-05-24 MED ORDER — SODIUM CHLORIDE 0.9 % IV SOLN
INTRAVENOUS | Status: AC
  Administered 2012-05-24 – 2012-05-25 (×2): via INTRAVENOUS

## 2012-05-24 MED ORDER — CLOPIDOGREL BISULFATE 75 MG PO TABS
75.0000 mg | ORAL_TABLET | Freq: Every day | ORAL | Status: DC
  Administered 2012-05-25 – 2012-05-26 (×2): 75 mg via ORAL
  Filled 2012-05-24 (×2): qty 1

## 2012-05-24 MED ORDER — DIVALPROEX SODIUM 250 MG PO DR TAB
250.0000 mg | DELAYED_RELEASE_TABLET | Freq: Three times a day (TID) | ORAL | Status: DC
  Administered 2012-05-24 – 2012-05-25 (×2): 250 mg via ORAL
  Filled 2012-05-24 (×4): qty 1

## 2012-05-24 MED ORDER — SODIUM CHLORIDE 0.9 % IV SOLN: INTRAVENOUS | Status: DC

## 2012-05-24 MED ORDER — LORAZEPAM 2 MG/ML IJ SOLN
1.0000 mg | INTRAMUSCULAR | Status: DC | PRN
  Filled 2012-05-24: qty 1

## 2012-05-24 MED ORDER — ATORVASTATIN CALCIUM 40 MG PO TABS
40.0000 mg | ORAL_TABLET | Freq: Every day | ORAL | Status: DC
  Administered 2012-05-25: 40 mg via ORAL
  Filled 2012-05-24 (×2): qty 1

## 2012-05-24 MED ORDER — TAMSULOSIN HCL 0.4 MG PO CAPS
0.8000 mg | ORAL_CAPSULE | Freq: Two times a day (BID) | ORAL | Status: DC
  Administered 2012-05-24 – 2012-05-26 (×4): 0.8 mg via ORAL
  Filled 2012-05-24 (×5): qty 2

## 2012-05-24 MED ORDER — SODIUM CHLORIDE 0.9 % IV BOLUS (SEPSIS)
1000.0000 mL | Freq: Once | INTRAVENOUS | Status: AC
  Administered 2012-05-24: 1000 mL via INTRAVENOUS

## 2012-05-24 NOTE — ED Notes (Signed)
Provider at bedside

## 2012-05-24 NOTE — ED Notes (Signed)
Pt here with altered mental status and strange behavior x 2 days per family; pt with hx of PTSD and did not take meds yesterday; pt alert to person and place only at present
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
## 2012-05-24 NOTE — ED Notes (Signed)
MD at bedside. 
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
## 2012-05-24 NOTE — ED Notes (Signed)
Old and new EKG handed to Dr. Denton Lank.  Extra copy of both placed in pt chart

## 2012-05-25 ENCOUNTER — Inpatient Hospital Stay (HOSPITAL_COMMUNITY): Payer: Medicare Other

## 2012-05-25 DIAGNOSIS — I10 Essential (primary) hypertension: Secondary | ICD-10-CM

## 2012-05-25 DIAGNOSIS — D696 Thrombocytopenia, unspecified: Secondary | ICD-10-CM

## 2012-05-25 LAB — CARDIAC PANEL(CRET KIN+CKTOT+MB+TROPI)
Relative Index: 2 (ref 0.0–2.5)
Relative Index: 2.4 (ref 0.0–2.5)
Total CK: 269 U/L — ABNORMAL HIGH (ref 7–232)
Troponin I: <0.3 ng/mL (ref <0.30)

## 2012-05-25 LAB — GLUCOSE, CAPILLARY
Glucose-Capillary: 125 mg/dL — ABNORMAL HIGH (ref 70–99)
Glucose-Capillary: 178 mg/dL — ABNORMAL HIGH (ref 70–99)
Glucose-Capillary: 112 mg/dL — ABNORMAL HIGH (ref 70–99)
Glucose-Capillary: 162 mg/dL — ABNORMAL HIGH (ref 70–99)

## 2012-05-25 LAB — COMPREHENSIVE METABOLIC PANEL
Albumin: 2.9 g/dL — ABNORMAL LOW (ref 3.5–5.2)
Alkaline Phosphatase: 43 U/L (ref 39–117)
BUN: 45 mg/dL — ABNORMAL HIGH (ref 6–23)
CO2: 25 mEq/L (ref 19–32)
Chloride: 110 mEq/L (ref 96–112)
GFR calc non Af Amer: 35 mL/min — ABNORMAL LOW (ref >90)
Glucose, Bld: 151 mg/dL — ABNORMAL HIGH (ref 70–99)
Potassium: 4.6 mEq/L (ref 3.5–5.1)
Total Bilirubin: 0.2 mg/dL — ABNORMAL LOW (ref 0.3–1.2)

## 2012-05-25 LAB — HEMOGLOBIN A1C: Hgb A1c MFr Bld: 6.4 % — ABNORMAL HIGH (ref <5.7)

## 2012-05-25 LAB — RAPID URINE DRUG SCREEN, HOSP PERFORMED
Amphetamines: NOT DETECTED
Benzodiazepines: NOT DETECTED
Cocaine: NOT DETECTED
Opiates: POSITIVE — AB

## 2012-05-25 LAB — CBC WITH DIFFERENTIAL/PLATELET
Basophils Absolute: 0 10*3/uL (ref 0.0–0.1)
Eosinophils Absolute: 0.1 10*3/uL (ref 0.0–0.7)
Eosinophils Relative: 2 % (ref 0–5)
Lymphocytes Relative: 37 % (ref 12–46)
MCH: 30.6 pg (ref 26.0–34.0)
MCV: 91.6 fL (ref 78.0–100.0)
Neutrophils Relative %: 49 % (ref 43–77)
Platelets: 119 10*3/uL — ABNORMAL LOW (ref 150–400)
RDW: 13 % (ref 11.5–15.5)
WBC: 4.2 10*3/uL (ref 4.0–10.5)

## 2012-05-25 LAB — LIPID PANEL: Total CHOL/HDL Ratio: 5.7 RATIO

## 2012-05-25 LAB — TSH: TSH: 2.897 u[IU]/mL (ref 0.350–4.500)

## 2012-05-25 MED ORDER — LEVETIRACETAM 500 MG PO TABS
500.0000 mg | ORAL_TABLET | Freq: Two times a day (BID) | ORAL | Status: DC
  Administered 2012-05-25 – 2012-05-26 (×2): 500 mg via ORAL
  Filled 2012-05-25 (×3): qty 1

## 2012-05-25 MED ORDER — LORAZEPAM 2 MG/ML IJ SOLN: 1.0000 mg | Freq: Once | INTRAMUSCULAR | Status: DC

## 2012-05-25 MED ORDER — ACETAMINOPHEN 325 MG PO TABS
650.0000 mg | ORAL_TABLET | Freq: Four times a day (QID) | ORAL | Status: DC | PRN
  Administered 2012-05-25 – 2012-05-26 (×2): 650 mg via ORAL
  Filled 2012-05-25 (×2): qty 2

## 2012-05-25 MED ORDER — HYDROCODONE-ACETAMINOPHEN 5-325 MG PO TABS: 0.5000 | ORAL_TABLET | Freq: Four times a day (QID) | ORAL | Status: DC | PRN

## 2012-05-25 NOTE — Progress Notes (Signed)
Utilization review completed.  

## 2012-05-25 NOTE — Progress Notes (Signed)
Subjective: Per wife mentation improved from yesterday and is close to baseline.  Per wife patient is grumpy at baseline.  Denies any pain.  Objective: Vital signs in last 24 hours: Filed Vitals:   05/25/12 0200 05/25/12 0400 05/25/12 0600 05/25/12 1400  BP: 162/75 117/62 123/47 140/55  Pulse: 74 69 63 64  Temp: 98.8 F (37.1 C) 98 F (36.7 C) 98 F (36.7 C) 98.2 F (36.8 C)  TempSrc: Oral Oral    Resp: 20 19 20 18   Height:      Weight:      SpO2: 94% 98% 98% 97%   Weight change:   Intake/Output Summary (Last 24 hours) at 05/25/12 1551 Last data filed at 05/25/12 1200  Gross per 24 hour  Intake    360 ml  Output    200 ml  Net    160 ml    Physical Exam: General: Awake, Oriented, No acute distress. HEENT: EOMI. Neck: Supple CV: S1 and S2 Lungs: Clear to ascultation bilaterally Abdomen: Soft, Nontender, Nondistended, +bowel sounds. Ext: Good pulses. Trace edema.  Lab Results: Basic Metabolic Panel:  Lab 05/25/12 4010 05/24/12 1422  NA 144 145  K 4.6 5.1  CL 110 104  CO2 25 29  GLUCOSE 151* 177*  BUN 45* 50*  CREATININE 1.89* 2.60*  CALCIUM 8.6 10.4  MG -- --  PHOS -- --   Liver Function Tests:  Lab 05/25/12 0500 05/24/12 2107  AST 20 23  ALT 15 17  ALKPHOS 43 48  BILITOT 0.2* 0.2*  PROT 5.3* 5.9*  ALBUMIN 2.9* 3.1*   No results found for this basename: LIPASE:5,AMYLASE:5 in the last 168 hours  Lab 05/24/12 2107  AMMONIA 10*   CBC:  Lab 05/25/12 0500 05/24/12 1422  WBC 4.2 6.5  NEUTROABS 2.1 3.9  HGB 10.9* 12.5*  HCT 32.6* 38.0*  MCV 91.6 92.9  PLT 119* 142*   Cardiac Enzymes:  Lab 05/25/12 1352 05/25/12 0528 05/24/12 2239 05/24/12 1913  CKTOTAL 184 269* 441* --  CKMB 4.4* 5.3* 7.1* --  CKMBINDEX -- -- -- --  TROPONINI <0.30 <0.30 <0.30 <0.30   BNP (last 3 results)  Basename 12/06/11 1652  PROBNP 160.8*   CBG:  Lab 05/25/12 1140 05/25/12 0728 05/24/12 2241  GLUCAP 178* 125* 108*    Basename 05/24/12 2239  HGBA1C 6.4*    Other Labs: No components found with this basename: POCBNP:3 No results found for this basename: DDIMER:2 in the last 168 hours  Lab 05/25/12 0500  CHOL 143  HDL 25*  LDLCALC 74  TRIG 272*  CHOLHDL 5.7  LDLDIRECT --    Lab 05/24/12 2239  TSH 2.897  T4TOTAL --  T3FREE --  FREET4 --  THYROIDAB --   No results found for this basename: VITAMINB12:2,FOLATE:2,FERRITIN:2,TIBC:2,IRON:2,RETICCTPCT:2 in the last 168 hours  Micro Results: No results found for this or any previous visit (from the past 240 hour(s)).  Studies/Results: Dg Chest 2 View  05/24/2012  *RADIOLOGY REPORT*  Clinical Data: Diabetic with shortness of breath and altered mental status.  CHEST - 2 VIEW  Comparison: 12/06/2011 and 01/28/2012.  Findings: The heart size and mediastinal contours are stable status post CABG.  The lungs are clear.  There is no pleural effusion or pneumothorax.  Prominent nipple shadows are again noted.  Patient is status post cholecystectomy.  IMPRESSION: No acute cardiopulmonary process.   Original Report Authenticated By: Gerrianne Scale, M.D.    Ct Head Wo Contrast  05/24/2012  *RADIOLOGY REPORT*  Clinical Data: Altered mental status  CT HEAD WITHOUT CONTRAST  Technique:  Contiguous axial images were obtained from the base of the skull through the vertex without contrast.  Comparison: 12/20/2011  Findings: Cortical volume loss noted with proportional ventricular prominence.  Periventricular white matter hypodensity likely indicates small vessel ischemic change.  No acute hemorrhage, acute infarction, or mass lesion is identified.  Evidence of right antrectomy noted.  No skull fracture.  IMPRESSION: Chronic findings as above, no acute intracranial finding.   Original Report Authenticated By: Harrel Lemon, M.D.     Medications: I have reviewed the patient's current medications. Scheduled Meds:   . atorvastatin  40 mg Oral q1800  . clopidogrel  75 mg Oral Q breakfast  . insulin  aspart  0-9 Units Subcutaneous TID WC  . sodium chloride  1,000 mL Intravenous Once  . Tamsulosin HCl  0.8 mg Oral BID  . DISCONTD: sodium chloride   Intravenous STAT  . DISCONTD: divalproex  250 mg Oral TID   Continuous Infusions:   . sodium chloride 125 mL/hr at 05/25/12 0817   PRN Meds:.LORazepam  Assessment/Plan: Acute encephalopathy/Acute Delirium/Altered mental status Etiology unclear. May include medication induced, possible seizures, possible CVA among other reasons.  Had an extensive discussion with patient's wife was not sure of patient's compliance with Depakote.  In the past was placed on Keppra but for some unclear reason will switch back Depakote.  Will restart the patient on Keppra.  Discussed with patient's wife about finding a neurologist.  Plasma ammonia level low at 10.  Liver function tests unremarkable.  MRI of the brain pending.  EEG done but pending.  Acute renal failure  Improved. Likely due to poor intake. UA is negative. Continue IV hydration. Discontinue Ibuprofen.    History of CAD status post CABG Denies any chest pain and this time.  Cardiac enzymes negative x3.  No events on telemetry.  History of CVA and carotid stenosis  Stable.  Continue Plavix.  Diabetes Sliding scale insulin.  History of possible seizures Depakote discontinued, start Keppra.  In the past Depakote was discontinued due to worsening thrombocytopenia  Thrombocytopenia Etiology unclear.  May be due to history of alcohol use.  Platelet count stable.  History of PTSD Stable.  Prophylaxis SCDs.  CODE STATUS Full code.  Disposition Pending MRI and EEG results.    LOS: 1 day  [name] A, MD 05/25/2012, 3:51 PM

## 2012-05-25 NOTE — Progress Notes (Signed)
CRITICAL VALUE ALERT  Critical value received:  CKMB 7.1, CK total 441  Date of notification:  05/24/12  Time of notification:  2240  Critical value read back: Yes  Nurse who received alert:  Lenna Gilford, RN  MD notified (1st page):  Dr. Toniann Fail  Time of first page:  2245  MD notified (2nd page): N/A  Time of second page: N/A  Responding MD:  Dr. Toniann Fail  Time MD responded:  [phone]

## 2012-05-25 NOTE — Progress Notes (Signed)
Routine EEG completed.  

## 2012-05-26 DIAGNOSIS — R4182 Altered mental status, unspecified: Secondary | ICD-10-CM

## 2012-05-26 DIAGNOSIS — I2510 Atherosclerotic heart disease of native coronary artery without angina pectoris: Secondary | ICD-10-CM

## 2012-05-26 LAB — CBC
MCH: 29.8 pg (ref 26.0–34.0)
MCV: 91.2 fL (ref 78.0–100.0)
Platelets: 120 10*3/uL — ABNORMAL LOW (ref 150–400)
RDW: 13.2 % (ref 11.5–15.5)
WBC: 3.9 10*3/uL — ABNORMAL LOW (ref 4.0–10.5)

## 2012-05-26 LAB — BASIC METABOLIC PANEL
Calcium: 8.3 mg/dL — ABNORMAL LOW (ref 8.4–10.5)
Creatinine, Ser: 1.32 mg/dL (ref 0.50–1.35)
GFR calc Af Amer: 63 mL/min — ABNORMAL LOW (ref >90)
Sodium: 143 mEq/L (ref 135–145)

## 2012-05-26 MED ORDER — LEVETIRACETAM 500 MG PO TABS: 500.0000 mg | ORAL_TABLET | Freq: Two times a day (BID) | ORAL | Status: AC

## 2012-05-26 NOTE — Procedures (Signed)
EEG NUMBER:  [id number].  REFERRING PHYSICIAN:  Eduard Clos, MD.  INDICATIONS FOR STUDY:  A 66 year old man with a history of coronary artery disease and stroke, as well as seizure disorder and renal failure who was admitted for altered mental status with confusion.  Studies being performed to rule out slowing of brain activity as well as to rule out seizure activity.  DESCRIPTION:  This is a routine EEG recording performed during wakefulness.  The predominant background activity consisted of low- amplitude 2-3 Hz delta activity diffusely and symmetrically with superimposed 6-7 Hz theta activity recorded predominantly from posterior head regions as well as low-amplitude 20-25 Hz beta activity recorded from the frontal and central regions.  Photic stimulation produced a symmetrical occipital driving response.  Hyperventilation was not performed.  There were no areas of disproportionate slowing of cerebral activity.  No epileptiform discharges were seen as well.  INTERPRETATION:  This EEG shows moderately severe generalized continuous nonspecific slowing of cerebral activity.  This pattern of slowing can be seen with a wide variety of metabolic as well as degenerative cerebral disorders.  No evidence of an epileptic disorder was demonstrated.     Noel Christmas, MD    [id number] D:  05/25/2012 11:14:11  T:  05/26/2012 01:37:03  Job #:  [id number]

## 2012-05-26 NOTE — Progress Notes (Signed)
Subjective: Had diarrhea this morning, per wife and patient patient has had multiple recurrent episodes of diarrhea in the past which resolve.  Objective: Vital signs in last 24 hours: Filed Vitals:   05/25/12 2000 05/26/12 0400 05/26/12 0800 05/26/12 1216  BP: 149/69 156/62 155/60 152/74  Pulse: 76 63 71   Temp: 98.4 F (36.9 C) 97.7 F (36.5 C) 98 F (36.7 C)   TempSrc: Axillary Oral Oral Oral  Resp:   20 18  Height:      Weight:      SpO2: 99% 100% 98% 97%   Weight change:   Intake/Output Summary (Last 24 hours) at 05/26/12 1246 Last data filed at 05/26/12 0851  Gross per 24 hour  Intake    360 ml  Output      0 ml  Net    360 ml    Physical Exam: General: Awake, Oriented, No acute distress. HEENT: EOMI. Neck: Supple CV: S1 and S2 Lungs: Clear to ascultation bilaterally Abdomen: Soft, Nontender, Nondistended, +bowel sounds. Ext: Good pulses. Trace edema.  Lab Results: Basic Metabolic Panel:  Lab 05/26/12 4696 05/25/12 0500 05/24/12 1422  NA 143 144 145  K 4.2 4.6 5.1  CL 112 110 104  CO2 24 25 29   GLUCOSE 138* 151* 177*  BUN 26* 45* 50*  CREATININE 1.32 1.89* 2.60*  CALCIUM 8.3* 8.6 10.4  MG -- -- --  PHOS -- -- --   Liver Function Tests:  Lab 05/25/12 0500 05/24/12 2107  AST 20 23  ALT 15 17  ALKPHOS 43 48  BILITOT 0.2* 0.2*  PROT 5.3* 5.9*  ALBUMIN 2.9* 3.1*   No results found for this basename: LIPASE:5,AMYLASE:5 in the last 168 hours  Lab 05/24/12 2107  AMMONIA 10*   CBC:  Lab 05/26/12 0705 05/25/12 0500 05/24/12 1422  WBC 3.9* 4.2 6.5  NEUTROABS -- 2.1 3.9  HGB 10.5* 10.9* 12.5*  HCT 32.1* 32.6* 38.0*  MCV 91.2 91.6 92.9  PLT 120* 119* 142*   Cardiac Enzymes:  Lab 05/25/12 1352 05/25/12 0528 05/24/12 2239 05/24/12 1913  CKTOTAL 184 269* 441* --  CKMB 4.4* 5.3* 7.1* --  CKMBINDEX -- -- -- --  TROPONINI <0.30 <0.30 <0.30 <0.30   BNP (last 3 results)  Basename 12/06/11 1652  PROBNP 160.8*   CBG:  Lab 05/26/12 1214  05/26/12 0742 05/25/12 2007 05/25/12 1630 05/25/12 1140  GLUCAP 119* 130* 162* 112* 178*    Basename 05/24/12 2239  HGBA1C 6.4*   Other Labs: No components found with this basename: POCBNP:3 No results found for this basename: DDIMER:2 in the last 168 hours  Lab 05/25/12 0500  CHOL 143  HDL 25*  LDLCALC 74  TRIG 295*  CHOLHDL 5.7  LDLDIRECT --    Lab 05/24/12 2239  TSH 2.897  T4TOTAL --  T3FREE --  FREET4 --  THYROIDAB --   No results found for this basename: VITAMINB12:2,FOLATE:2,FERRITIN:2,TIBC:2,IRON:2,RETICCTPCT:2 in the last 168 hours  Micro Results: No results found for this or any previous visit (from the past 240 hour(s)).  Studies/Results: Dg Chest 2 View  05/24/2012  *RADIOLOGY REPORT*  Clinical Data: Diabetic with shortness of breath and altered mental status.  CHEST - 2 VIEW  Comparison: 12/06/2011 and 01/28/2012.  Findings: The heart size and mediastinal contours are stable status post CABG.  The lungs are clear.  There is no pleural effusion or pneumothorax.  Prominent nipple shadows are again noted.  Patient is status post cholecystectomy.  IMPRESSION: No acute cardiopulmonary  process.   Original Report Authenticated By: Gerrianne Scale, M.D.    Ct Head Wo Contrast  05/24/2012  *RADIOLOGY REPORT*  Clinical Data: Altered mental status  CT HEAD WITHOUT CONTRAST  Technique:  Contiguous axial images were obtained from the base of the skull through the vertex without contrast.  Comparison: 12/20/2011  Findings: Cortical volume loss noted with proportional ventricular prominence.  Periventricular white matter hypodensity likely indicates small vessel ischemic change.  No acute hemorrhage, acute infarction, or mass lesion is identified.  Evidence of right antrectomy noted.  No skull fracture.  IMPRESSION: Chronic findings as above, no acute intracranial finding.   Original Report Authenticated By: Harrel Lemon, M.D.    Mri Brain Without Contrast  05/25/2012   *RADIOLOGY REPORT*  Clinical Data: Confusion.  Coronary artery disease.  Bilateral carotid stenosis.  MRI HEAD WITHOUT CONTRAST  Technique:  Multiplanar, multiecho pulse sequences of the brain and surrounding structures were obtained according to standard protocol without intravenous contrast.  Comparison: 05/24/2012 CT.  12/07/2011 MR.  Findings: Motion degraded exam.  The patient was not able to complete T1-weighted axial images.  No acute infarct.  No intracranial hemorrhage.  Remote infarcts cerebellum bilaterally and occipital lobe bilaterally as well  basal ganglia infarcts bilaterally.  Moderate small vessel disease type changes.  Global atrophy without hydrocephalus.  No intracranial mass lesion detected on this unenhanced exam.  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  Mild cervical spondylotic changes.  Mild paranasal sinus mucosal thickening.  IMPRESSION: Motion degraded exam.  The patient was not able to complete T1- weighted axial images.  No acute infarct.  No intracranial hemorrhage.  Remote infarcts cerebellum bilaterally and occipital lobe bilaterally as well  basal ganglia infarcts bilaterally.  Moderate small vessel disease type changes.  Global atrophy without hydrocephalus.  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   Original Report Authenticated By: Fuller Canada, M.D.     Medications: I have reviewed the patient's current medications. Scheduled Meds:    . atorvastatin  40 mg Oral q1800  . clopidogrel  75 mg Oral Q breakfast  . insulin aspart  0-9 Units Subcutaneous TID WC  . levETIRAcetam  500 mg Oral BID  . LORazepam  1 mg Intravenous Once  . Tamsulosin HCl  0.8 mg Oral BID  . DISCONTD: divalproex  250 mg Oral TID   Continuous  Infusions:    . sodium chloride 125 mL/hr at 05/25/12 0817   PRN Meds:.acetaminophen, HYDROcodone-acetaminophen, LORazepam  Assessment/Plan: Acute encephalopathy/Acute Delirium/Altered mental status Etiology unclear. May include medication induced, possible seizures? Had an extensive discussion with patient's wife was not sure of patient's compliance with Depakote.  In the past was placed on Keppra but for some unclear reason will switch back Depakote.  Continue Keppra which was restarted.  Instructed patient and wife that any titration of Keppra or his seizure medication changes need to be made by a neurologist.  Plasma ammonia level low at 10.  Liver function tests unremarkable.  MRI of the brain on 05/25/2012 show no acute infarct, no intracranial hemorrhage, remote infarcts of the cerebellum bilaterally and occipital lobes bilaterally as well as basal ganglia infarcts bilaterally.  EEG on 05/26/2012 show no evidence of epileptic disorder.  Acute renal failure  Likely due to poor intake. UA is  negative.  Resolved with IV hydration.  Discontinue Ibuprofen.    History of CAD status post CABG Denies any chest pain and this time.  Cardiac enzymes negative x3.  No events on telemetry.  History of CVA and carotid stenosis  Stable.  Continue Plavix.  Diabetes Hemoglobin A1c 6.4 on 05/24/2012, diet controlled continue to monitor.  History of possible seizures Depakote discontinued, start Keppra.  In the past Depakote was discontinued due to worsening thrombocytopenia  Thrombocytopenia Etiology unclear.  May be due to history of alcohol use.  Platelet count stable.  History of PTSD Stable.  Chronic intermittent episodes of diarrhea Patient was instructed to followup with a gastroenterologist as outpatient  Prophylaxis SCDs.  CODE STATUS Full code.  Disposition Discharge the patient today.    LOS: 2 days  Makaiya Geerdes A, MD 05/26/2012, 12:46 PM

## 2012-05-26 NOTE — Progress Notes (Signed)
Pt discharged to home per MD order. Pt and wife received all discharge instructions and medication information including follow-up appointments and prescriptions.  Pt alert and oriented at discharge with no complaints of pain.  Pt escorted to private vehicle via wheelchair.  [name]

## 2012-05-26 NOTE — Discharge Summary (Signed)
Physician Discharge Summary  [name] RUE:[id number] DOB: 15-Dec-1945 DOA: 05/24/2012  PCP: Loreen Freud, DO  Admit date: 05/24/2012 Discharge date: 05/26/2012  Recommendations for Outpatient Follow-up:  1. Follow up with your primary care physician in 1 week. 2. Followup with neurology in 1 to 2 weeks for management of your seizure medications. 3. Followup with gastroenterology in one month for management of chronic intermittent diarrhea.  Discharge Diagnoses:  Principal Problem:  *Encephalopathy acute Active Problems:  CVA  CAD (coronary artery disease)  Seizure disorder  ARF (acute renal failure)   Discharge Condition: Stable  Diet recommendation: Heart healthy diet  Filed Weights   05/24/12 2230  Weight: 91.899 kg (202 lb 9.6 oz)    History of present illness:  66 year old male with history of coronary artery disease status post CABG, CVA, and bilateral carotid stenosis who presented on 05/24/2012 with confusion.  Hospital Course:  Acute encephalopathy/Acute Delirium/Altered mental status  Etiology unclear.  Differential may medication induced, possible seizures? Had a discussion with patient's wife, she is not sure of patient's compliance with Depakote. In the past he was placed on Keppra but for some unclear reason will switch back Depakote. Continue Keppra which was restarted during the hospital stay, Depakote discontinued. Instructed patient and wife that any titration of Keppra or his seizure medication changes need to be made by a neurologist. Plasma ammonia level low at 10. Liver function tests unremarkable. MRI of the brain on 05/25/2012 show no acute infarct, no intracranial hemorrhage, remote infarcts of the cerebellum bilaterally and occipital lobes bilaterally as well as basal ganglia infarcts bilaterally. EEG on 05/26/2012 show no evidence of epileptic disorder.  The patient's mentation was at baseline prior to discharge.  Acute renal failure  Likely due to  poor intake. UA is negative. Resolved with IV hydration.  History of CAD status post CABG  Denies any chest pain and this time. Cardiac enzymes negative x3. No events on telemetry.   History of CVA and carotid stenosis  Stable. Continue Plavix.   Diabetes  Hemoglobin A1c 6.4 on 05/24/2012, diet controlled continue to monitor.   History of possible seizures  Depakote discontinued, start Keppra. In the past Depakote was discontinued due to worsening thrombocytopenia   Thrombocytopenia  Etiology unclear. May be due to history of alcohol use. Platelet count stable.   History of PTSD  Stable.   Chronic intermittent episodes of diarrhea  Patient was instructed to followup with a gastroenterologist as outpatient  Procedures:  As above.  Consultations:  None  Discharge Exam: Filed Vitals:   05/26/12 1216  BP: 152/74  Pulse:   Temp:   Resp: 18   Filed Vitals:   05/25/12 2000 05/26/12 0400 05/26/12 0800 05/26/12 1216  BP: 149/69 156/62 155/60 152/74  Pulse: 76 63 71   Temp: 98.4 F (36.9 C) 97.7 F (36.5 C) 98 F (36.7 C)   TempSrc: Axillary Oral Oral Oral  Resp:   20 18  Height:      Weight:      SpO2: 99% 100% 98% 97%   Discharge Instructions  Discharge Orders    Future Appointments: Provider: Department: Dept Phone: Center:   06/02/2012 11:45 AM Rollene Rotunda, MD [facility] [id number]     Future Orders Please Complete By Expires   Diet - low sodium heart healthy      Increase activity slowly      Discharge instructions      Comments:   Followup with Loreen Freud, DO (  PCP) in 1 week. Please follow up with neurology in 1-2 weeks as outpatient.  Followup with gastroenterology in one month for further evaluation of chronic intermittent diarrhea.     Medication List  As of 05/26/2012 12:57 PM   STOP taking these medications         divalproex 250 MG DR tablet         TAKE these medications         calcium carbonate 500 MG  chewable tablet   Commonly known as: TUMS - dosed in mg elemental calcium   Chew 1 tablet by mouth as needed. Several times a day      clonazePAM 0.5 MG tablet   Commonly known as: KLONOPIN   Take 1 mg by mouth 2 (two) times daily as needed. For anxiety      clopidogrel 75 MG tablet   Commonly known as: PLAVIX   Take 75 mg by mouth daily.      diphenoxylate-atropine 2.5-0.025 MG per tablet   Commonly known as: LOMOTIL   Take 1 tablet by mouth 4 (four) times daily as needed for diarrhea or loose stools.      FLOMAX 0.4 MG Caps   Generic drug: Tamsulosin HCl   Take 0.8 mg by mouth 2 (two) times daily.      LACTOBACILLUS PO   Take 2 capsules by mouth 2 (two) times daily.      levETIRAcetam 500 MG tablet   Commonly known as: KEPPRA   Take 1 tablet (500 mg total) by mouth 2 (two) times daily.      QUEtiapine 200 MG tablet   Commonly known as: SEROQUEL   Take 100 mg by mouth at bedtime.      simvastatin 80 MG tablet   Commonly known as: ZOCOR   Take 40 mg by mouth at bedtime.      venlafaxine 75 MG tablet   Commonly known as: EFFEXOR   Take 75-150 mg by mouth 2 (two) times daily. He takes 2 150mg  tablets in the morning and 1 75 mg at bedtime.           Follow-up Information    Follow up with Loreen Freud, DO. Schedule an appointment as soon as possible for a visit in 1 week.   Contact information:   [address] [place] [address] [phone]       Follow up with [facility] . Schedule an appointment as soon as possible for a visit in 1 week. (For management of seizure mendications)    Contact information:   [address] [place] [address] [phone]      Follow up with [facility]. Schedule an appointment as soon as possible for a visit in 1 month. (For chronic intermittent diarrhea.)    Contact information:   [address] [place]  [address] [phone]          The results of significant diagnostics from this hospitalization (including imaging, microbiology, ancillary and laboratory) are listed below for reference.    Significant Diagnostic Studies: Dg Chest 2 View  05/24/2012  *RADIOLOGY REPORT*  Clinical Data: Diabetic with shortness of breath and altered mental status.  CHEST - 2 VIEW  Comparison: 12/06/2011 and 01/28/2012.  Findings: The heart size and mediastinal contours are stable status post CABG.  The lungs are clear.  There is no pleural effusion or pneumothorax.  Prominent nipple shadows are  again noted.  Patient is status post cholecystectomy.  IMPRESSION: No acute cardiopulmonary process.   Original Report Authenticated By: Gerrianne Scale, M.D.    Ct Head Wo Contrast  05/24/2012  *RADIOLOGY REPORT*  Clinical Data: Altered mental status  CT HEAD WITHOUT CONTRAST  Technique:  Contiguous axial images were obtained from the base of the skull through the vertex without contrast.  Comparison: 12/20/2011  Findings: Cortical volume loss noted with proportional ventricular prominence.  Periventricular white matter hypodensity likely indicates small vessel ischemic change.  No acute hemorrhage, acute infarction, or mass lesion is identified.  Evidence of right antrectomy noted.  No skull fracture.  IMPRESSION: Chronic findings as above, no acute intracranial finding.   Original Report Authenticated By: Harrel Lemon, M.D.    Mri Brain Without Contrast  05/25/2012  *RADIOLOGY REPORT*  Clinical Data: Confusion.  Coronary artery disease.  Bilateral carotid stenosis.  MRI HEAD WITHOUT CONTRAST  Technique:  Multiplanar, multiecho pulse sequences of the brain and surrounding structures were obtained according to standard protocol without intravenous contrast.  Comparison: 05/24/2012 CT.  12/07/2011 MR.  Findings: Motion degraded exam.  The patient was not able to complete T1-weighted axial images.  No acute infarct.  No  intracranial hemorrhage.  Remote infarcts cerebellum bilaterally and occipital lobe bilaterally as well  basal ganglia infarcts bilaterally.  Moderate small vessel disease type changes.  Global atrophy without hydrocephalus.  No intracranial mass lesion detected on this unenhanced exam.  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  Mild cervical spondylotic changes.  Mild paranasal sinus mucosal thickening.  IMPRESSION: Motion degraded exam.  The patient was not able to complete T1- weighted axial images.  No acute infarct.  No intracranial hemorrhage.  Remote infarcts cerebellum bilaterally and occipital lobe bilaterally as well  basal ganglia infarcts bilaterally.  Moderate small vessel disease type changes.  Global atrophy without hydrocephalus.  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   Original Report Authenticated By: Fuller Canada, M.D.     Microbiology: No results found for this or any previous visit (from the past 240 hour(s)).   Labs: Basic Metabolic Panel:  Lab 05/26/12 2841 05/25/12 0500 05/24/12 1422  NA 143 144 145  K 4.2 4.6 5.1  CL 112 110 104  CO2 24 25 29   GLUCOSE 138* 151* 177*  BUN 26* 45* 50*  CREATININE 1.32 1.89* 2.60*  CALCIUM 8.3* 8.6 10.4  MG -- -- --  PHOS -- -- --   Liver Function Tests:  Lab 05/25/12 0500 05/24/12 2107  AST 20 23  ALT 15 17  ALKPHOS 43 48  BILITOT 0.2* 0.2*  PROT 5.3* 5.9*  ALBUMIN 2.9* 3.1*   No results found for this basename: LIPASE:5,AMYLASE:5 in the last 168 hours  Lab 05/24/12 2107  AMMONIA 10*   CBC:  Lab 05/26/12 0705 05/25/12 0500 05/24/12 1422  WBC 3.9* 4.2 6.5  NEUTROABS -- 2.1 3.9  HGB 10.5* 10.9* 12.5*  HCT 32.1* 32.6* 38.0*  MCV 91.2 91.6 92.9  PLT 120* 119* 142*    Cardiac Enzymes:  Lab 05/25/12 1352 05/25/12 0528 05/24/12 2239 05/24/12 1913  CKTOTAL 184 269* 441* --  CKMB 4.4* 5.3* 7.1* --  CKMBINDEX -- -- -- --  TROPONINI <0.30 <0.30 <0.30 <0.30   BNP: BNP (last 3 results)  Basename 12/06/11 1652  PROBNP 160.8*   CBG:  Lab 05/26/12 1214 05/26/12 0742 05/25/12 2007 05/25/12 1630 05/25/12 1140  GLUCAP 119* 130* 162* 112* 178*    Time coordinating discharge: 40 minutes  Signed:  Wesson Stith A  [facility] 05/26/2012, 12:57 PM

## 2012-05-29 NOTE — ED Provider Notes (Signed)
Medical screening examination/treatment/procedure(s) were conducted as a shared visit with non-physician practitioner(s) and myself.  I personally evaluated the patient during the encounter Pt with altered ms, weakness, renal insuff. [facility] to admit.   Suzi Roots, MD 05/29/12 [phone]

## 2012-06-02 ENCOUNTER — Encounter: Payer: Self-pay | Admitting: Family Medicine

## 2012-06-02 ENCOUNTER — Ambulatory Visit (INDEPENDENT_AMBULATORY_CARE_PROVIDER_SITE_OTHER): Payer: Federal, State, Local not specified - PPO | Admitting: Cardiology

## 2012-06-02 ENCOUNTER — Ambulatory Visit (INDEPENDENT_AMBULATORY_CARE_PROVIDER_SITE_OTHER): Payer: Federal, State, Local not specified - PPO | Admitting: Family Medicine

## 2012-06-02 ENCOUNTER — Encounter: Payer: Self-pay | Admitting: Cardiology

## 2012-06-02 VITALS — BP 90/60 | HR 64 | Ht 69.0 in | Wt 192.8 lb

## 2012-06-02 VITALS — BP 130/68 | HR 103 | Temp 98.6°F | Wt 190.4 lb

## 2012-06-02 DIAGNOSIS — R197 Diarrhea, unspecified: Secondary | ICD-10-CM

## 2012-06-02 DIAGNOSIS — R569 Unspecified convulsions: Secondary | ICD-10-CM

## 2012-06-02 DIAGNOSIS — K589 Irritable bowel syndrome without diarrhea: Secondary | ICD-10-CM

## 2012-06-02 DIAGNOSIS — R739 Hyperglycemia, unspecified: Secondary | ICD-10-CM

## 2012-06-02 DIAGNOSIS — G40909 Epilepsy, unspecified, not intractable, without status epilepticus: Secondary | ICD-10-CM

## 2012-06-02 DIAGNOSIS — D518 Other vitamin B12 deficiency anemias: Secondary | ICD-10-CM

## 2012-06-02 DIAGNOSIS — R7309 Other abnormal glucose: Secondary | ICD-10-CM

## 2012-06-02 DIAGNOSIS — F4310 Post-traumatic stress disorder, unspecified: Secondary | ICD-10-CM

## 2012-06-02 DIAGNOSIS — I10 Essential (primary) hypertension: Secondary | ICD-10-CM

## 2012-06-02 DIAGNOSIS — N179 Acute kidney failure, unspecified: Secondary | ICD-10-CM

## 2012-06-02 DIAGNOSIS — I25810 Atherosclerosis of coronary artery bypass graft(s) without angina pectoris: Secondary | ICD-10-CM

## 2012-06-02 DIAGNOSIS — E785 Hyperlipidemia, unspecified: Secondary | ICD-10-CM

## 2012-06-02 DIAGNOSIS — G9340 Encephalopathy, unspecified: Secondary | ICD-10-CM

## 2012-06-02 DIAGNOSIS — G5600 Carpal tunnel syndrome, unspecified upper limb: Secondary | ICD-10-CM

## 2012-06-02 DIAGNOSIS — E119 Type 2 diabetes mellitus without complications: Secondary | ICD-10-CM

## 2012-06-02 LAB — BASIC METABOLIC PANEL
BUN: 26 mg/dL — ABNORMAL HIGH (ref 6–23)
Chloride: 105 mEq/L (ref 96–112)
Potassium: 4.2 mEq/L (ref 3.5–5.3)
Sodium: 141 mEq/L (ref 135–145)

## 2012-06-02 LAB — HEMOGLOBIN A1C
Hgb A1c MFr Bld: 6.4 % — ABNORMAL HIGH (ref <5.7)
Mean Plasma Glucose: 137 mg/dL — ABNORMAL HIGH (ref <117)

## 2012-06-02 MED ORDER — CYANOCOBALAMIN 1000 MCG/ML IJ SOLN
1000.0000 ug | Freq: Once | INTRAMUSCULAR | Status: AC
Start: 2012-06-02 — End: 2012-06-02
  Administered 2012-06-02: 1000 ug via INTRAMUSCULAR

## 2012-06-02 NOTE — Assessment & Plan Note (Signed)
Check a1c 

## 2012-06-02 NOTE — Assessment & Plan Note (Signed)
Stable con't meds 

## 2012-06-02 NOTE — Assessment & Plan Note (Signed)
Splint given to pt Refer hand surgeon

## 2012-06-02 NOTE — Assessment & Plan Note (Signed)
F/u neuro  

## 2012-06-02 NOTE — Assessment & Plan Note (Signed)
Labs reviewed Cont current tx

## 2012-06-02 NOTE — Assessment & Plan Note (Signed)
Pt had problem with it in hospital

## 2012-06-02 NOTE — Patient Instructions (Addendum)
Seizures You had a seizure. About 2% of the population will have a seizure problem during their lifetime. Sometimes the cause for the seizure is not known. Seizures are usually associated with one of these problems:  Epilepsy.   Not taking your seizure medicine.   Alcohol and drug abuse.   Head injury, strokes, tumors, and brain surgery.   High fever and infections.   Low blood sugar.  Evaluating a new seizure disorder may require having a brain scan or a brain wave test called an EEG. If you have been given a seizure medicine, it is very important that you take it as prescribed. Not taking these medicines as directed is the most common cause of seizures. Blood tests are often used to be sure you are taking the proper dose.  Seizures cause many different symptoms, from convulsions to brief blackouts. Do not ride a bike, drive a car, go swimming, climb in high or dangerous places such as ladders or roofs, or operate any dangerous equipment until you have your doctor's permission. If you hold a driver's license, state law may require that a report be made to the motor vehicles department. You should wear an emergency medical identification bracelet with information about your seizures. If you have any warning that a seizure may occur, lie down in a safe place to protect yourself. Teach your family and friends what to do if you have any further seizures. They should stay calm and try to keep you from falling on hard or sharp objects. It is best not to try to restrain a seizing person or to force anything into his or her mouth. Do not try to open clenched jaws. When the seizure is over, the person should be rolled on their side to help drain any vomit or secretions from the mouth. After a seizure, a person may be confused or drowsy for several minutes. An ambulance should be called if the seizure lasted more than 5 minutes or if confusion remains for more than 30 minutes. Call your caregiver or the  emergency department for further instructions. Do not drive until cleared by your caregiver or neurologist! Document Released: 10/28/2004 Document Revised: 06/02/2011 Document Reviewed: 09/20/2005 Exit[employer] Patient Information 2012 [employer]. 

## 2012-06-02 NOTE — Assessment & Plan Note (Signed)
Improved upon d/c

## 2012-06-02 NOTE — Assessment & Plan Note (Signed)
F/u psych 

## 2012-06-02 NOTE — Assessment & Plan Note (Signed)
Needs f/u with neuro
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
## 2012-06-02 NOTE — Patient Instructions (Addendum)
Your physician wants you to follow-up in:  12 months.  You will receive a reminder letter in the mail two months in advance. If you don't receive a letter, please call our office to schedule the follow-up appointment.   
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
## 2012-06-06 DIAGNOSIS — E119 Type 2 diabetes mellitus without complications: Secondary | ICD-10-CM | POA: Insufficient documentation

## 2012-06-08 MED ORDER — METFORMIN HCL ER 500 MG PO TB24: 500.0000 mg | ORAL_TABLET | Freq: Every evening | ORAL | Status: DC

## 2012-06-15 ENCOUNTER — Encounter: Payer: Self-pay | Admitting: Family Medicine

## 2012-06-15 ENCOUNTER — Ambulatory Visit (INDEPENDENT_AMBULATORY_CARE_PROVIDER_SITE_OTHER): Payer: Federal, State, Local not specified - PPO | Admitting: Family Medicine

## 2012-06-15 VITALS — BP 132/74 | HR 72 | Temp 98.3°F | Wt 193.0 lb

## 2012-06-15 DIAGNOSIS — E119 Type 2 diabetes mellitus without complications: Secondary | ICD-10-CM

## 2012-06-15 MED ORDER — ONETOUCH VERIO IQ VI STRP: ORAL_STRIP | Status: DC

## 2012-06-18 ENCOUNTER — Encounter: Payer: Self-pay | Admitting: Family Medicine

## 2012-06-18 NOTE — Progress Notes (Signed)
  Subjective:    Patient ID: [name], male    DOB: [birth year], 66 y.o.   MRN: [id number]  HPI Pt is here to go over labs and get  A new glucometer.  No new complaints.   Review of Systems    as above Objective:   Physical Exam  Constitutional: He appears well-developed and well-nourished.  Cardiovascular: Normal rate and regular rhythm.   Pulmonary/Chest: Effort normal and breath sounds normal.  Neurological: He is alert.  Psychiatric: He has a normal mood and affect. His behavior is normal. Thought content normal.          Assessment & Plan:

## 2012-06-18 NOTE — Assessment & Plan Note (Signed)
Check glucose bid fasting and 2 hours after a meal con't meds Recheck labs 3 months.

## 2012-06-27 ENCOUNTER — Ambulatory Visit (INDEPENDENT_AMBULATORY_CARE_PROVIDER_SITE_OTHER): Payer: Federal, State, Local not specified - PPO | Admitting: Internal Medicine

## 2012-06-27 ENCOUNTER — Encounter: Payer: Self-pay | Admitting: Internal Medicine

## 2012-06-27 VITALS — BP 90/60 | HR 80 | Ht 69.75 in | Wt 190.2 lb

## 2012-06-27 DIAGNOSIS — K589 Irritable bowel syndrome without diarrhea: Secondary | ICD-10-CM

## 2012-06-27 NOTE — Patient Instructions (Signed)
Please continue the diphenoxylate and atropine as needed to prevent spells of diarrhea with your IBS.  Thank you for choosing me and [facility].  Iva Boop, MD, [facility]

## 2012-06-27 NOTE — Progress Notes (Signed)
Patient ID: [name], male   DOB: 1946-08-11, 66 y.o.   MRN: [id number]  Had a spell of episodic diarrhea in the hospital. This is a known chronic problem and when seen her last he began using prophylactic generic Lomotil to prevent diarrhea when out of the house.  In general has been better using prophylactic generic Lomotil.  Medications, allergies, past medical history, past surgical history, family history and social history are reviewed and updated in the EMR.   Ass/Plan  1. IBS (irritable bowel syndrome) with episodic urgent diarrhea         Continue prophylactic generic Lomotil. See GI as needed - regular follow-up PCP

## 2012-08-21 ENCOUNTER — Telehealth: Payer: Self-pay | Admitting: Neurology

## 2012-08-21 NOTE — Telephone Encounter (Signed)
Forward 4 pages from [facility] to Dr. Denton Meek for review on 08-21-12 ym

## 2012-12-05 ENCOUNTER — Encounter (HOSPITAL_BASED_OUTPATIENT_CLINIC_OR_DEPARTMENT_OTHER): Payer: Self-pay | Admitting: *Deleted

## 2012-12-05 ENCOUNTER — Emergency Department (HOSPITAL_BASED_OUTPATIENT_CLINIC_OR_DEPARTMENT_OTHER)
Admission: EM | Admit: 2012-12-05 | Discharge: 2012-12-05 | Disposition: A | Discharge status: Home / Self Care | Payer: Federal, State, Local not specified - PPO | Attending: Emergency Medicine | Admitting: Emergency Medicine

## 2012-12-05 ENCOUNTER — Emergency Department (HOSPITAL_BASED_OUTPATIENT_CLINIC_OR_DEPARTMENT_OTHER): Payer: Federal, State, Local not specified - PPO

## 2012-12-05 DIAGNOSIS — Z8739 Personal history of other diseases of the musculoskeletal system and connective tissue: Secondary | ICD-10-CM | POA: Insufficient documentation

## 2012-12-05 DIAGNOSIS — Z860100 Personal history of colon polyps, unspecified: Secondary | ICD-10-CM | POA: Insufficient documentation

## 2012-12-05 DIAGNOSIS — R110 Nausea: Secondary | ICD-10-CM | POA: Insufficient documentation

## 2012-12-05 DIAGNOSIS — J4489 Other specified chronic obstructive pulmonary disease: Secondary | ICD-10-CM | POA: Insufficient documentation

## 2012-12-05 DIAGNOSIS — R059 Cough, unspecified: Secondary | ICD-10-CM | POA: Insufficient documentation

## 2012-12-05 DIAGNOSIS — Z862 Personal history of diseases of the blood and blood-forming organs and certain disorders involving the immune mechanism: Secondary | ICD-10-CM | POA: Insufficient documentation

## 2012-12-05 DIAGNOSIS — Z8673 Personal history of transient ischemic attack (TIA), and cerebral infarction without residual deficits: Secondary | ICD-10-CM | POA: Insufficient documentation

## 2012-12-05 DIAGNOSIS — Z87820 Personal history of traumatic brain injury: Secondary | ICD-10-CM | POA: Insufficient documentation

## 2012-12-05 DIAGNOSIS — Z8659 Personal history of other mental and behavioral disorders: Secondary | ICD-10-CM | POA: Insufficient documentation

## 2012-12-05 DIAGNOSIS — F3289 Other specified depressive episodes: Secondary | ICD-10-CM | POA: Insufficient documentation

## 2012-12-05 DIAGNOSIS — Z87448 Personal history of other diseases of urinary system: Secondary | ICD-10-CM | POA: Insufficient documentation

## 2012-12-05 DIAGNOSIS — G8929 Other chronic pain: Secondary | ICD-10-CM | POA: Insufficient documentation

## 2012-12-05 DIAGNOSIS — G40909 Epilepsy, unspecified, not intractable, without status epilepticus: Secondary | ICD-10-CM | POA: Insufficient documentation

## 2012-12-05 DIAGNOSIS — Z79899 Other long term (current) drug therapy: Secondary | ICD-10-CM | POA: Insufficient documentation

## 2012-12-05 DIAGNOSIS — R6883 Chills (without fever): Secondary | ICD-10-CM

## 2012-12-05 DIAGNOSIS — Z8709 Personal history of other diseases of the respiratory system: Secondary | ICD-10-CM | POA: Insufficient documentation

## 2012-12-05 DIAGNOSIS — Z8679 Personal history of other diseases of the circulatory system: Secondary | ICD-10-CM | POA: Insufficient documentation

## 2012-12-05 DIAGNOSIS — J449 Chronic obstructive pulmonary disease, unspecified: Secondary | ICD-10-CM | POA: Insufficient documentation

## 2012-12-05 DIAGNOSIS — E119 Type 2 diabetes mellitus without complications: Secondary | ICD-10-CM | POA: Insufficient documentation

## 2012-12-05 DIAGNOSIS — I2510 Atherosclerotic heart disease of native coronary artery without angina pectoris: Secondary | ICD-10-CM | POA: Insufficient documentation

## 2012-12-05 DIAGNOSIS — I10 Essential (primary) hypertension: Secondary | ICD-10-CM | POA: Insufficient documentation

## 2012-12-05 DIAGNOSIS — Z8719 Personal history of other diseases of the digestive system: Secondary | ICD-10-CM | POA: Insufficient documentation

## 2012-12-05 DIAGNOSIS — Z85820 Personal history of malignant melanoma of skin: Secondary | ICD-10-CM | POA: Insufficient documentation

## 2012-12-05 DIAGNOSIS — F411 Generalized anxiety disorder: Secondary | ICD-10-CM | POA: Insufficient documentation

## 2012-12-05 DIAGNOSIS — Z7902 Long term (current) use of antithrombotics/antiplatelets: Secondary | ICD-10-CM | POA: Insufficient documentation

## 2012-12-05 DIAGNOSIS — Z8701 Personal history of pneumonia (recurrent): Secondary | ICD-10-CM | POA: Insufficient documentation

## 2012-12-05 DIAGNOSIS — Z87891 Personal history of nicotine dependence: Secondary | ICD-10-CM | POA: Insufficient documentation

## 2012-12-05 DIAGNOSIS — E669 Obesity, unspecified: Secondary | ICD-10-CM | POA: Insufficient documentation

## 2012-12-05 DIAGNOSIS — Z951 Presence of aortocoronary bypass graft: Secondary | ICD-10-CM | POA: Insufficient documentation

## 2012-12-05 DIAGNOSIS — R05 Cough: Secondary | ICD-10-CM | POA: Insufficient documentation

## 2012-12-05 DIAGNOSIS — R509 Fever, unspecified: Secondary | ICD-10-CM | POA: Insufficient documentation

## 2012-12-05 DIAGNOSIS — Z8639 Personal history of other endocrine, nutritional and metabolic disease: Secondary | ICD-10-CM | POA: Insufficient documentation

## 2012-12-05 DIAGNOSIS — E785 Hyperlipidemia, unspecified: Secondary | ICD-10-CM | POA: Insufficient documentation

## 2012-12-05 DIAGNOSIS — M79609 Pain in unspecified limb: Secondary | ICD-10-CM | POA: Insufficient documentation

## 2012-12-05 LAB — COMPREHENSIVE METABOLIC PANEL
ALT: 11 U/L (ref 0–53)
Alkaline Phosphatase: 77 U/L (ref 39–117)
BUN: 19 mg/dL (ref 6–23)
Chloride: 102 mEq/L (ref 96–112)
GFR calc Af Amer: 64 mL/min — ABNORMAL LOW (ref >90)
Glucose, Bld: 213 mg/dL — ABNORMAL HIGH (ref 70–99)
Potassium: 3.8 mEq/L (ref 3.5–5.1)
Sodium: 138 mEq/L (ref 135–145)
Total Bilirubin: 0.3 mg/dL (ref 0.3–1.2)
Total Protein: 6.9 g/dL (ref 6.0–8.3)

## 2012-12-05 LAB — URINALYSIS, ROUTINE W REFLEX MICROSCOPIC
Bilirubin Urine: NEGATIVE
Leukocytes, UA: NEGATIVE
Nitrite: NEGATIVE
Specific Gravity, Urine: 1.013 (ref 1.005–1.030)
Urobilinogen, UA: 0.2 mg/dL (ref 0.0–1.0)
pH: 5.5 (ref 5.0–8.0)

## 2012-12-05 LAB — CBC WITH DIFFERENTIAL/PLATELET
Eosinophils Absolute: 0.2 10*3/uL (ref 0.0–0.7)
Hemoglobin: 14.2 g/dL (ref 13.0–17.0)
Lymphocytes Relative: 30 % (ref 12–46)
Lymphs Abs: 1.9 10*3/uL (ref 0.7–4.0)
Monocytes Relative: 8 % (ref 3–12)
Neutro Abs: 3.8 10*3/uL (ref 1.7–7.7)
Neutrophils Relative %: 59 % (ref 43–77)
Platelets: 175 10*3/uL (ref 150–400)
RBC: 4.92 MIL/uL (ref 4.22–5.81)
WBC: 6.4 10*3/uL (ref 4.0–10.5)

## 2012-12-05 MED ORDER — SODIUM CHLORIDE 0.9 % IV BOLUS (SEPSIS)
1000.0000 mL | Freq: Once | INTRAVENOUS | Status: AC
  Administered 2012-12-05: 1000 mL via INTRAVENOUS

## 2012-12-05 NOTE — ED Notes (Signed)
Fever. Chills. Blurred vision. Nausea.  Symptoms for a few days.

## 2012-12-05 NOTE — ED Notes (Signed)
Pt. Did eat and drink this morning.
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
## 2012-12-22 ENCOUNTER — Telehealth: Payer: Self-pay

## 2012-12-22 MED ORDER — CLOPIDOGREL BISULFATE 75 MG PO TABS: 75.0000 mg | ORAL_TABLET | Freq: Every day | ORAL | Status: DC

## 2012-12-22 MED ORDER — RIVASTIGMINE 9.5 MG/24HR TD PT24: 1.0000 | MEDICATED_PATCH | Freq: Every day | TRANSDERMAL | Status: DC

## 2012-12-22 NOTE — Telephone Encounter (Signed)
Patient requesting refills

## 2013-03-29 ENCOUNTER — Encounter: Payer: Self-pay | Admitting: Internal Medicine

## 2013-05-03 ENCOUNTER — Ambulatory Visit (INDEPENDENT_AMBULATORY_CARE_PROVIDER_SITE_OTHER): Payer: Federal, State, Local not specified - PPO | Admitting: Internal Medicine

## 2013-05-03 ENCOUNTER — Encounter: Payer: Self-pay | Admitting: Internal Medicine

## 2013-05-03 VITALS — BP 148/70 | HR 64 | Ht 69.0 in | Wt 192.5 lb

## 2013-05-03 DIAGNOSIS — Z86010 Personal history of colonic polyps: Secondary | ICD-10-CM

## 2013-05-03 NOTE — Patient Instructions (Addendum)
We anticipate sending a letter about having a colonoscopy again in 2 years.  If you develop rectal bleeding, changes in bowels or abdominal pain please call us sooner.  I appreciate the opportunity to care for you.

## 2013-05-03 NOTE — Progress Notes (Signed)
  Subjective:    Patient ID: [name], male    DOB: 1946/06/25, 67 y.o.   MRN: [id number]  HPI The patient is without complaints. He had a colonoscopy 5 years ago w/o polyps with hx of prior polypectomy at [facility] but no records.  Medications, allergies, past medical history, past surgical history, family history and social history are reviewed and updated in the EMR.  Review of Systems As above    Objective:   Physical Exam NAD    Assessment & Plan:  Personal history of colonic polyps  We discussed repeating colonoscopy now - it is unclear if he really needs it as we do not know size and types of polyps reported removed 10 yrs ago and no polyps 2009.  I offered to do one - we have decided to reconsider in 2 years as he does not wish to have a colonoscopy at this time.

## 2013-05-29 ENCOUNTER — Ambulatory Visit (INDEPENDENT_AMBULATORY_CARE_PROVIDER_SITE_OTHER): Payer: Federal, State, Local not specified - PPO | Admitting: Cardiology

## 2013-05-29 ENCOUNTER — Encounter: Payer: Self-pay | Admitting: Cardiology

## 2013-05-29 VITALS — BP 132/90 | HR 61 | Ht 69.0 in | Wt 193.0 lb

## 2013-05-29 DIAGNOSIS — I6523 Occlusion and stenosis of bilateral carotid arteries: Secondary | ICD-10-CM

## 2013-05-29 DIAGNOSIS — I10 Essential (primary) hypertension: Secondary | ICD-10-CM

## 2013-05-29 DIAGNOSIS — I25810 Atherosclerosis of coronary artery bypass graft(s) without angina pectoris: Secondary | ICD-10-CM

## 2013-05-29 DIAGNOSIS — I6529 Occlusion and stenosis of unspecified carotid artery: Secondary | ICD-10-CM

## 2013-05-29 DIAGNOSIS — I2510 Atherosclerotic heart disease of native coronary artery without angina pectoris: Secondary | ICD-10-CM

## 2013-05-29 DIAGNOSIS — I658 Occlusion and stenosis of other precerebral arteries: Secondary | ICD-10-CM
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
## 2013-05-29 NOTE — Patient Instructions (Addendum)

## 2013-06-01 ENCOUNTER — Encounter (INDEPENDENT_AMBULATORY_CARE_PROVIDER_SITE_OTHER): Payer: Federal, State, Local not specified - PPO

## 2013-06-01 DIAGNOSIS — I6529 Occlusion and stenosis of unspecified carotid artery: Secondary | ICD-10-CM

## 2013-06-01 DIAGNOSIS — I6523 Occlusion and stenosis of bilateral carotid arteries: Secondary | ICD-10-CM

## 2013-06-04 ENCOUNTER — Encounter: Payer: Self-pay | Admitting: Cardiology

## 2013-06-08 ENCOUNTER — Telehealth: Payer: Self-pay | Admitting: Cardiology

## 2013-06-08 NOTE — Telephone Encounter (Signed)
Pt aware of carotid results and to repeat in 6 months 

## 2013-06-08 NOTE — Telephone Encounter (Signed)
Mailbox unavailable to accept messages - will continue to attempt to contact

## 2013-06-08 NOTE — Telephone Encounter (Signed)
New problem     Calling from test results.

## 2013-06-27 IMAGING — CT CT ABD-PELV W/ CM
4 of 5 series · 15 of 32 positions shown, 19 images · IV contrast (water/omni  & 100ml omni 300)
Comparison: 06/30/2007

CLINICAL DATA: Mid to lower abdominal pain.  Nausea.

CT ABDOMEN AND PELVIS WITH CONTRAST
TECHNIQUE: Multidetector CT imaging of the abdomen and pelvis was
performed following the standard protocol during bolus
administration of intravenous contrast.
Contrast: 100 ml Jmnipaque-LAA

[Series 2: routine abdomen · axial · 0.77mm/px · z∈[-372,-147]mm · 3 of 91 slices shown, 7 images]
[im 23/91  soft-tissue]
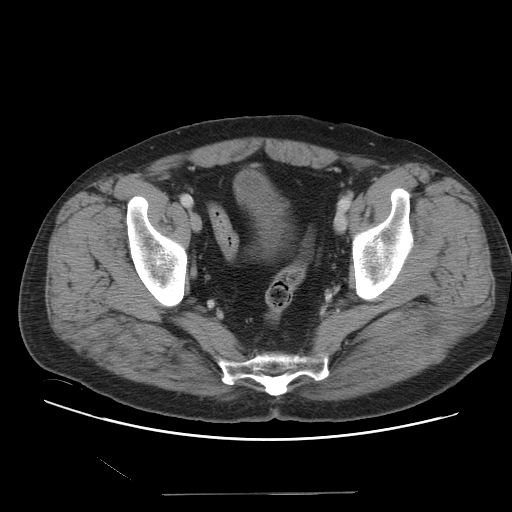
[im 23/91  lung]
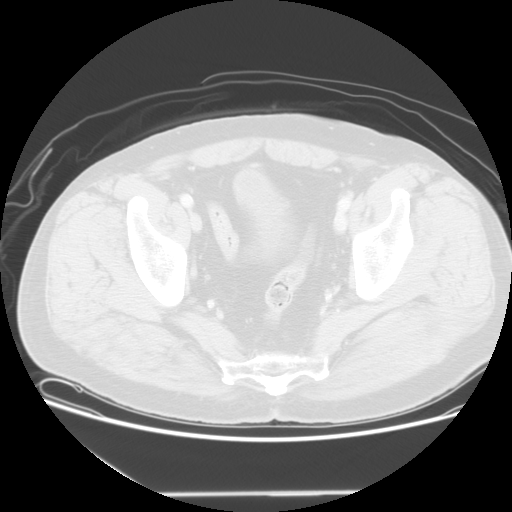
[im 23/91  bone]
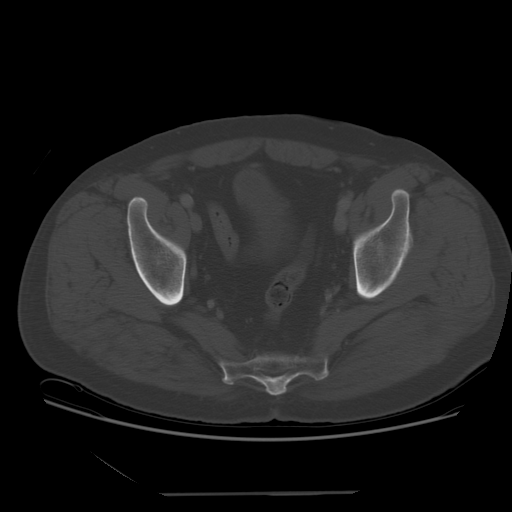
[im 46/91  soft-tissue]
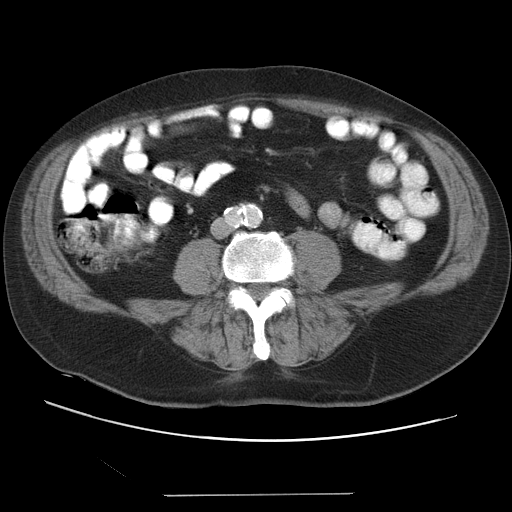
[im 46/91  lung]
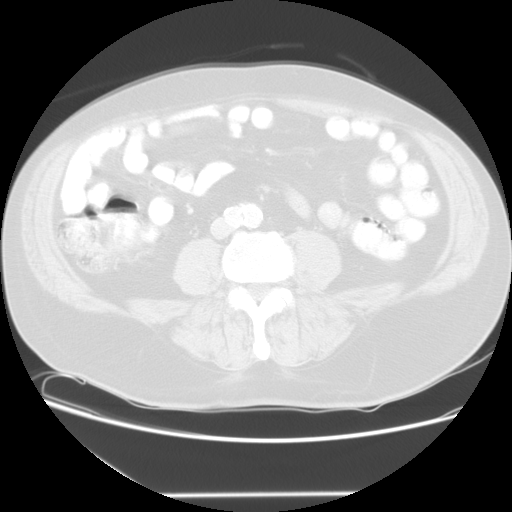
[im 68/91  soft-tissue]
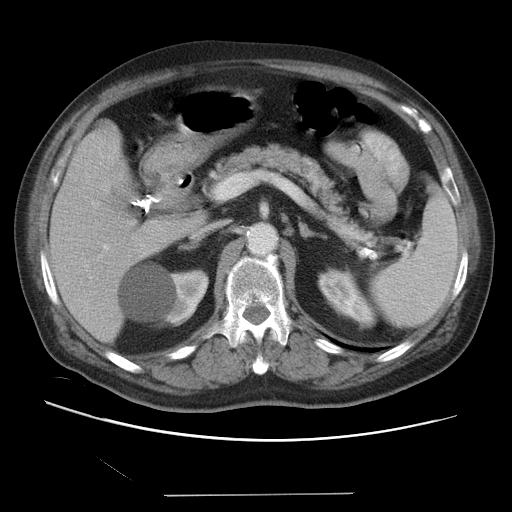
[im 68/91  lung]
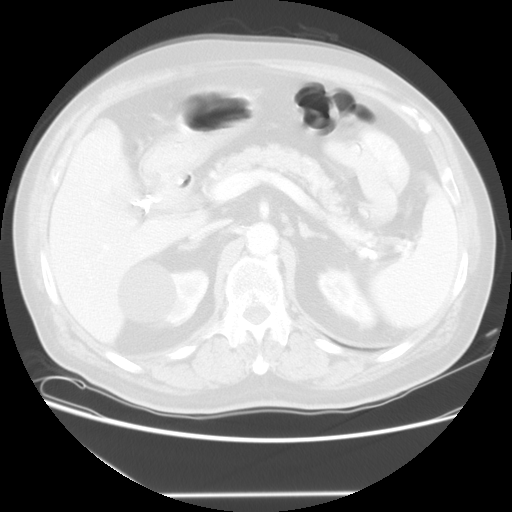

[Series 5: renal delays · axial · 0.75mm/px · z∈[-209,-109]mm · 2 of 60 slices shown]
[im 20/60  soft-tissue]
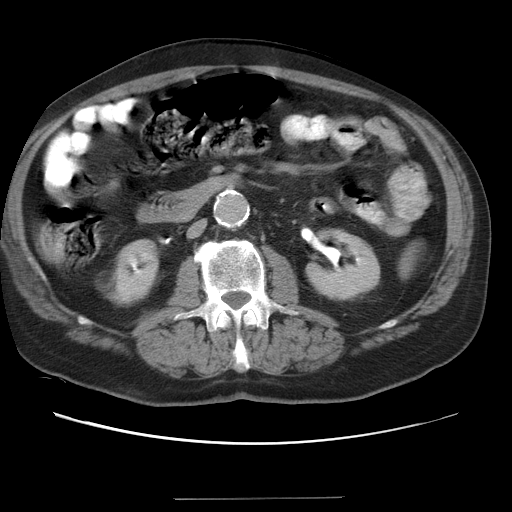
[im 40/60  soft-tissue]
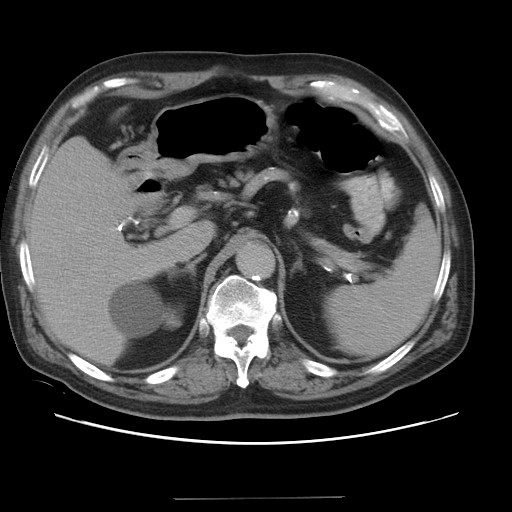

[Series 400: cor · coronal · 0.93mm/px · 2 of 150 slices shown]
[im 19/150  soft-tissue]
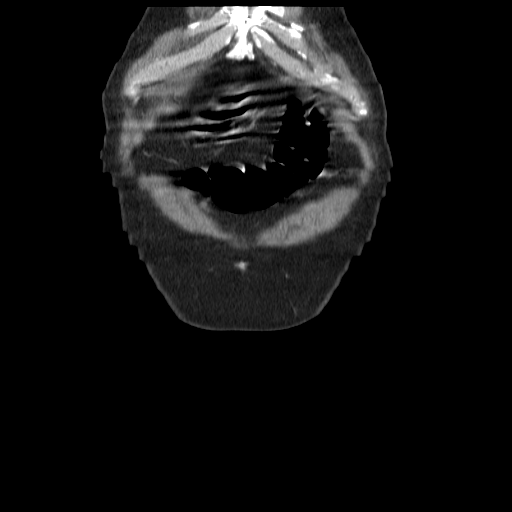
[im 38/150  soft-tissue]
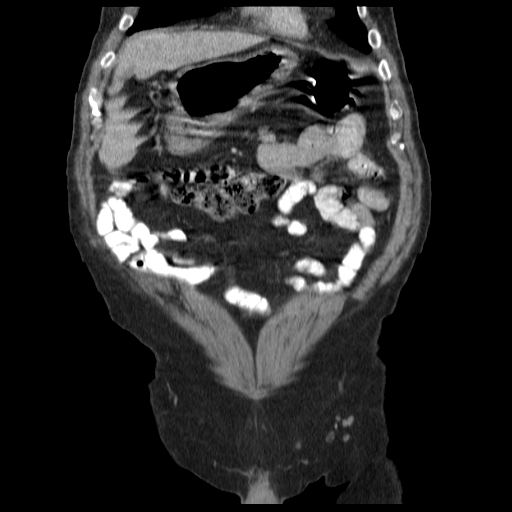

[Series 401: sag · sagittal · 0.93mm/px · 8 of 195 slices shown]
[im 18/195  soft-tissue]
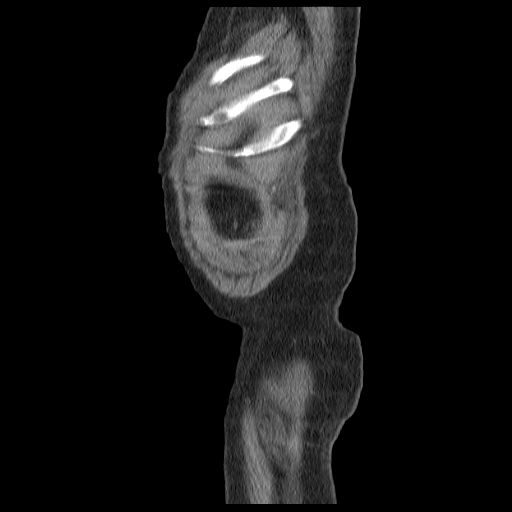
[im 36/195  soft-tissue]
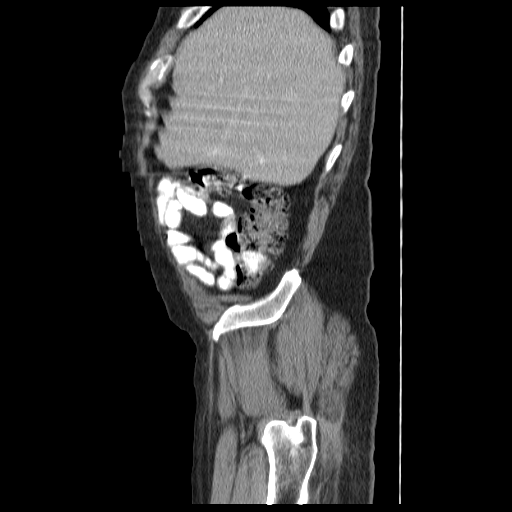
[im 71/195  soft-tissue]
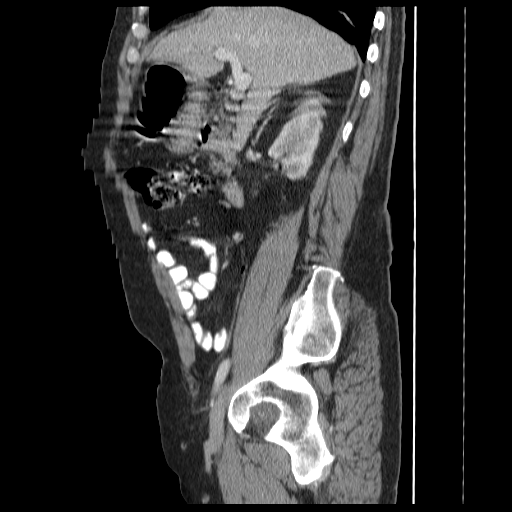
[im 89/195  soft-tissue]
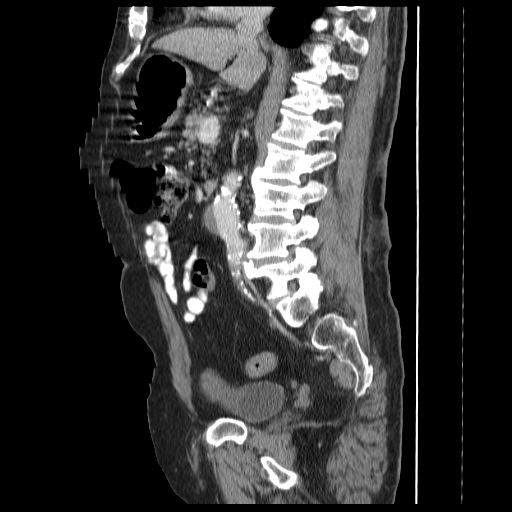
[im 106/195  soft-tissue]
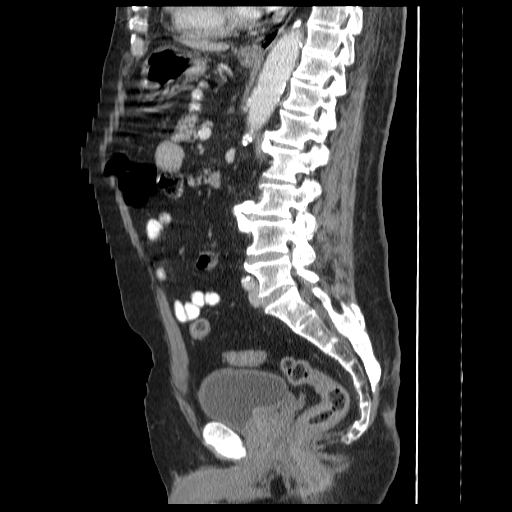
[im 124/195  soft-tissue]
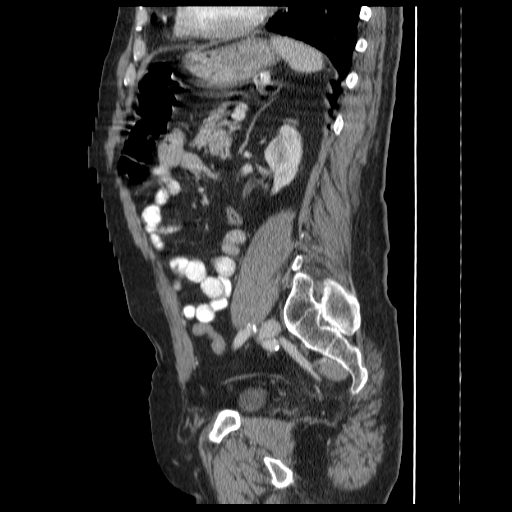
[im 159/195  soft-tissue]
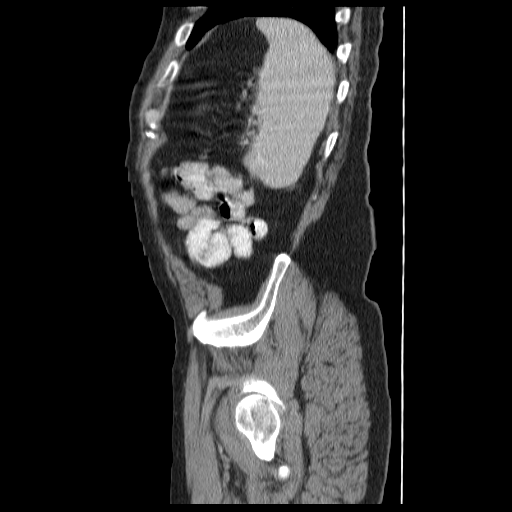
[im 177/195  soft-tissue]
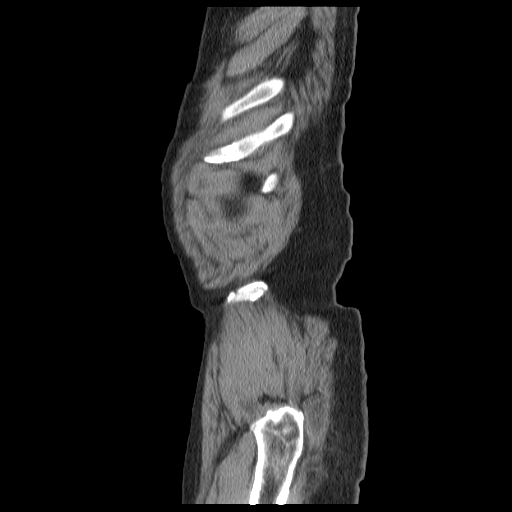

[15 of 32 positions shown; findings below may reference images not displayed]

FINDINGS: Breathing motion artifact mildly obscures a portion of
the liver, spleen, and pancreas.  No discrete abnormality of the
visualized portion of the liver, spleen, pancreas, or adrenal
glands noted.

Right kidney upper pole exophytic cyst noted.  The kidneys appear
otherwise unremarkable.

Infrarenal abdominal aortic aneurysm measures 4 cm in diameter.

Splenic artery atherosclerotic calcification noted.  The
gallbladder surgically absent.

No pathologic retroperitoneal or porta hepatis adenopathy is
identified.

The appendix appears normal.

Aortoiliac atherosclerotic calcification noted.

Borderline wall thickening noted along the dome of the urinary
bladder, correlate with urine analysis in assessing for cystitis.
Central prostate calcifications noted.  Mild acetabular spurring
noted bilaterally with degenerative subcortical cyst formation in
the left acetabulum.
IMPRESSION: 1.  Infrarenal fusiform abdominal aortic aneurysm, 4 cm.
2.   Slight wall thickening along the dome of the urinary bladder,
correlate with urine analysis in assessing for cystitis.
3.  Upper abdominal structures are mildly obscured by breathing
motion artifact.
4.  Simple-appearing right kidney upper pole cyst.
5.  Aortoiliac atherosclerotic calcification.
6.  Degenerative arthropathy of the hips bilaterally.

## 2013-06-28 ENCOUNTER — Other Ambulatory Visit: Payer: Self-pay

## 2013-06-28 MED ORDER — RIVASTIGMINE 9.5 MG/24HR TD PT24: 1.0000 | MEDICATED_PATCH | Freq: Every day | TRANSDERMAL | Status: DC

## 2013-06-28 MED ORDER — CLOPIDOGREL BISULFATE 75 MG PO TABS: 75.0000 mg | ORAL_TABLET | Freq: Every day | ORAL | Status: AC

## 2013-07-16 ENCOUNTER — Other Ambulatory Visit: Payer: Self-pay

## 2013-07-16 MED ORDER — RIVASTIGMINE 9.5 MG/24HR TD PT24: 1.0000 | MEDICATED_PATCH | Freq: Every day | TRANSDERMAL | Status: DC

## 2013-07-16 NOTE — Telephone Encounter (Signed)
Patient called requesting a refill on [employer].  Says he does not want the Rx written for 90 days, wants 30 days only.

## 2013-07-18 ENCOUNTER — Other Ambulatory Visit: Payer: Self-pay | Admitting: Internal Medicine

## 2013-08-23 ENCOUNTER — Telehealth: Payer: Self-pay | Admitting: Internal Medicine

## 2013-08-23 DIAGNOSIS — K625 Hemorrhage of anus and rectum: Secondary | ICD-10-CM

## 2013-08-23 NOTE — Telephone Encounter (Signed)
No answer.  I will continue to try and reach the patient 

## 2013-08-23 NOTE — Telephone Encounter (Signed)
No answer

## 2013-08-24 ENCOUNTER — Encounter: Payer: Self-pay | Admitting: Physician Assistant

## 2013-08-24 ENCOUNTER — Ambulatory Visit (INDEPENDENT_AMBULATORY_CARE_PROVIDER_SITE_OTHER): Payer: Federal, State, Local not specified - PPO | Admitting: Physician Assistant

## 2013-08-24 ENCOUNTER — Telehealth: Payer: Self-pay | Admitting: *Deleted

## 2013-08-24 ENCOUNTER — Other Ambulatory Visit (INDEPENDENT_AMBULATORY_CARE_PROVIDER_SITE_OTHER): Payer: Federal, State, Local not specified - PPO

## 2013-08-24 VITALS — BP 124/62 | HR 76 | Ht 69.0 in | Wt 192.0 lb

## 2013-08-24 DIAGNOSIS — K625 Hemorrhage of anus and rectum: Secondary | ICD-10-CM

## 2013-08-24 DIAGNOSIS — R109 Unspecified abdominal pain: Secondary | ICD-10-CM

## 2013-08-24 DIAGNOSIS — Z860100 Personal history of colon polyps, unspecified: Secondary | ICD-10-CM

## 2013-08-24 LAB — CBC WITH DIFFERENTIAL/PLATELET
Basophils Absolute: 0 10*3/uL (ref 0.0–0.1)
Basophils Relative: 0.3 % (ref 0.0–3.0)
Hemoglobin: 14.4 g/dL (ref 13.0–17.0)
Lymphocytes Relative: 29.8 % (ref 12.0–46.0)
Monocytes Relative: 7.7 % (ref 3.0–12.0)
Neutro Abs: 3.4 10*3/uL (ref 1.4–7.7)
Platelets: 171 10*3/uL (ref 150.0–400.0)
RDW: 14 % (ref 11.5–14.6)
WBC: 5.7 10*3/uL (ref 4.5–10.5)

## 2013-08-24 MED ORDER — NA SULFATE-K SULFATE-MG SULF 17.5-3.13-1.6 GM/177ML PO SOLN: 1.0000 | Freq: Once | ORAL | Status: AC

## 2013-08-24 MED ORDER — CIPROFLOXACIN HCL 500 MG PO TABS: 500.0000 mg | ORAL_TABLET | Freq: Two times a day (BID) | ORAL | Status: AC

## 2013-08-24 MED ORDER — DICYCLOMINE HCL 10 MG PO CAPS: 10.0000 mg | ORAL_CAPSULE | Freq: Three times a day (TID) | ORAL | Status: DC

## 2013-08-24 NOTE — Telephone Encounter (Signed)
Was speaking to the patient and we got disconnected.  I am not able to get him back on the phone.  He reports since Wed having lower abdominal cramping and a large amount of dark blood.  He reports "its a lot".  He says he has one episode a day.  He denies SOB, CP, being light headed or dizzy.  We were discussing his symptoms when we got cut off.  I will continue to try and reach him again

## 2013-08-24 NOTE — Progress Notes (Signed)
Subjective:    Patient ID: [name], male    DOB: 1946-01-12, 67 y.o.   MRN: [id number]  HPI  [name]  is a pleasant 67 year old white male known to Dr. Leone Payor. Patient has remote history of polyps on colonoscopy at done at [facility]. He had followup colonoscopy here in June of 2009 which was a negative exam with the exception of internal hemorrhoids. He also had upper endoscopy in 2009 showing erosive gastritis and another EGD in 2010 with duodenitis. Patient's medical problems include coronary artery disease for which she status post CABG in 2012, carotid stenosis, hypertension, hyperlipidemia, PTSD, and prior history of CVA. He is maintained on chronic Plavix and is followed by Dr. Antoine Poche. He comes in today with acute onset of lower abdominalcrampy pain on the Wednesday earlier this week. He states that he ate lunch at a diner and had been on let's which was supposed to have resuscitation and but may have had tripe in it. He says the food did not taste right and about an hour and half later he became ill with abdominal cramping and then urge for bowel movement at which time he passed stool mixed with blood. He says he did not feel well the rest of the day but did not have any more bowel movements or bleeding. The following day he continued to have cramping no fever or chills and has been able to eat though he's been somewhat afraid to eat. He has not had any nausea or vomiting. He had a total of 2 bowel movements yesterday and says it was mostly just blood. Today he continues to have lower bowel cramping but has not had any further bowel movements. He has remained on  Plavix    Review of Systems  Constitutional: Positive for appetite change.  HENT: Negative.   Eyes: Negative.   Cardiovascular: Negative.   Gastrointestinal: Positive for abdominal pain, diarrhea and blood in stool.  Endocrine: Negative.   Genitourinary: Negative.   Musculoskeletal: Negative.   Skin: Negative.    Allergic/Immunologic: Negative.   Neurological: Negative.   Hematological: Negative.   Psychiatric/Behavioral: Negative.    Outpatient Prescriptions Prior to Visit  Medication Sig Dispense Refill  . calcium carbonate (TUMS - DOSED IN MG ELEMENTAL CALCIUM) 500 MG chewable tablet Chew 1 tablet by mouth as needed. Several times a day       . clopidogrel (PLAVIX) 75 MG tablet Take 1 tablet (75 mg total) by mouth daily.  90 tablet  0  . cyanocobalamin (,VITAMIN B-12,) 1000 MCG/ML injection every 30 (thirty) days.      . diphenoxylate-atropine (LOMOTIL) 2.5-0.025 MG per tablet TAKE 1 TABLET BY MOUTH FOUR TIMES DAILY FOR DIARRHEA OR LOOSE STOOLS  60 tablet  0  . finasteride (PROSCAR) 5 MG tablet Take 5 mg by mouth daily.       Marland Kitchen LACTOBACILLUS PO Take 2 tablets by mouth 2 (two) times daily.      . mirtazapine (REMERON) 30 MG tablet Take 30 mg by mouth at bedtime.       . rivastigmine (EXELON) 9.5 mg/24hr Place 1 patch (9.5 mg total) onto the skin daily.  30 patch  0  . simvastatin (ZOCOR) 40 MG tablet Take 40 mg by mouth at bedtime.      . Tamsulosin HCl (FLOMAX) 0.4 MG CAPS Take 0.4 mg by mouth 2 (two) times daily.        No facility-administered medications prior to visit.   No Known Allergies Patient  Active Problem List   Diagnosis Date Noted  . Diabetes mellitus 06/06/2012  . CTS (carpal tunnel syndrome) 06/02/2012  . Encephalopathy acute 05/24/2012  . ARF (acute renal failure) 05/24/2012  . Hemorrhoids, internal, with bleeding 02/15/2012  . IBS (irritable bowel syndrome) with episodic urgent diarrhea 02/15/2012  . 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 History of CVA (cerebrovascular accident) 12/09/2011  . Seizure disorder 12/08/2011  . Altered mental status 12/07/2011  . CAD (coronary artery disease) 01/27/2011  . Carotid stenosis   . CVA 10/28/2008  . DIABETES MELLITUS, TYPE II 05/17/2008  . PTSD (post-traumatic stress disorder) 02/24/2008  . FATIGUE 02/24/2008  . GAIT IMBALANCE  02/24/2008  . COLONIC POLYPS, HX OF 02/24/2008  . HYPERLIPIDEMIA 06/22/2007  . THROMBOCYTOPENIA NOS 06/22/2007  . HYPERTENSION 06/22/2007  . ANEURYSM, ABDOMINAL AORTIC 06/22/2007  . GERD 06/22/2007  . FATTY LIVER DISEASE 06/22/2007  . PSA, INCREASED 06/22/2007  . INJURY NOS, HEAD 06/22/2007  . Personal History of Unspecified Circulatory Disease 06/22/2007  . HX, URINARY INFECTION 06/22/2007     History  Substance Use Topics  . Smoking status: Former Smoker -- 2.00 packs/day for 22 years    Types: Cigarettes    Quit date: 10/04/1980  . Smokeless tobacco: Never Used  . Alcohol Use: No   family history includes ALS in his maternal uncle; Emphysema in his father; Heart disease in his mother; Skin cancer in his maternal aunt and mother.     Objective:   Physical Exam Well-developed older white male in no acute distress blood pressure 124/62 pulse 76 height 5 foot 9 weight 192. HEENT; nontraumatic normocephalic EOMI PERRLA sclera anicteric, Supple no JVD, Cardiovascular; regular rate and rhythm with S1-S2 no murmur or gallop, sternal incisional scar, Pulmonary; clear bilaterally, Abdomen ;soft he has mild tenderness bilaterally in the lower quadrants there is no guarding or rebound no palpable mass or hepatosplenomegaly bowel sounds are active, Rectal exam no external lesions noted stool is yellowish with no gross blood though does test Hemoccult-positive, Extremities ;no clubbing cyanosis or edema skin warm and dry, Psych; mood and affect appropriate        Assessment & Plan:  #23  67 year old male with 48 hour history of lower, or cramping and bloody bowel movements. Symptoms occurred abruptly about an hour and a half after eating a questionable meal at a diner. Patient does not appear to be actively bleeding though stool is Hemoccult positive. I suspect he may have an acute food borne illness/infectious colitis. #2 history of internal hemorrhoids #3 remote history of colon polyps #4  chronic antiplatelet therapy with Plavix #5 coronary artery disease status post CABG #6 carotid stenosis #7 remote history of CVA #8 diabetes mellitus  Plan; CBC today Start Bentyl 10 mg by mouth 3 times daily as needed for abdominal pain and cramping Start Cipro 500 mg by mouth twice daily x7 days Soft bland diet and liberal fluids over the next few days Schedule patient for colonoscopy with Dr. Benjamine Mola was discussed in detail with patient he is agreeable to proceed. Patient mentions that he was not well sedated with his last procedure. He is sure that he will have heavy or sedation with propofol and that should not be an issue. We will obtain consent from Dr. hopper on for patient to hold Plavix for 5-6 days prior to colonoscopy. Patient will call back on Monday with progress report and is aware to seek emergency room care should he have a significant increase in his bleeding over the  weekend .

## 2013-08-24 NOTE — Telephone Encounter (Signed)
Patient called back. He reports he is having problems with his phone.  He will come for a stat CBC and then see Amy Esterwood PA at 10:30 this am

## 2013-08-24 NOTE — Patient Instructions (Signed)
You have been scheduled for a colonoscopy with propofol. Please follow written instructions given to you at your visit today.  Please pick up your prep kit at the pharmacy within the next 1-3 days. If you use inhalers (even only as needed), please bring them with you on the day of your procedure.  

## 2013-08-24 NOTE — Telephone Encounter (Signed)
08/24/2013   RE: [name] DOB: 07-17-1946 MRN: [id number]   Dear Rollene Rotunda,    We have scheduled the above patient for an endoscopic procedure. Our records show that he is on anticoagulation therapy.   Please advise as to how long the patient may come off his therapy of Plavix prior to the procedure, which is scheduled for Monday 09-03-2013.  Please fax back/ or route the completed form to [facility] CMA at [phone].   Sincerely,    Amy Esterwood PA-C    Lowry Ram CMA

## 2013-08-27 NOTE — Progress Notes (Signed)
Agree with Ms. Esterwood's assessment and plan. Carl E. Gessner, MD, FACG   

## 2013-08-29 NOTE — Telephone Encounter (Signed)
Was going to call patient to advise he is to be off the Plavix for 5 days prior to the procedure per Dr. Antoine Poche.  The patient cancelled the appointment, said he has too much going on right now.

## 2013-08-29 NOTE — Telephone Encounter (Signed)
Message copied by Derry Skill on Wed Aug 29, 2013  8:57 AM ------      Message from: Rollene Rotunda      Created: Tue Aug 28, 2013  5:40 PM      Regarding: RE: Plavix clearance       OK to hold Plavix as needed for the procedure.              ----- Message -----         From: Derry Skill, CMA         Sent: 08/28/2013   3:46 PM           To: Rollene Rotunda, MD, Rocco Serene, RN      Subject: Plavix clearance                                         10/24/2012                  RE: [name]      DOB: 06-14-1946      MRN: [id number]                  Dear Rollene Rotunda,                   We have scheduled the above patient for an endoscopic procedure. Our records show that he is on anticoagulation therapy.             Please advise as to how long the patient may come off his therapy of Plavix prior to the procedure, which is scheduled for Monday 09-03-2013.            Please fax back/ or route the completed form to [facility] CMA at [phone].             Sincerely,                        Amy Esterwood PA-C                        Lowry Ram CMA                   ------

## 2013-09-03 ENCOUNTER — Encounter: Payer: Federal, State, Local not specified - PPO | Admitting: Internal Medicine

## 2013-10-23 ENCOUNTER — Other Ambulatory Visit: Payer: Self-pay | Admitting: Internal Medicine

## 2013-10-23 NOTE — Telephone Encounter (Signed)
Refill x1 

## 2013-10-23 NOTE — Telephone Encounter (Signed)
Saw Amy 08/24/13 , please advise regarding refills Sir, thank you.

## 2013-11-21 ENCOUNTER — Other Ambulatory Visit (HOSPITAL_COMMUNITY): Payer: Self-pay | Admitting: Cardiology

## 2013-11-21 DIAGNOSIS — I6529 Occlusion and stenosis of unspecified carotid artery: Secondary | ICD-10-CM

## 2013-11-22 ENCOUNTER — Other Ambulatory Visit: Payer: Self-pay | Admitting: Neurology

## 2013-11-29 ENCOUNTER — Encounter (HOSPITAL_COMMUNITY): Payer: Federal, State, Local not specified - PPO

## 2013-12-04 ENCOUNTER — Telehealth: Payer: Self-pay | Admitting: Neurology

## 2013-12-04 ENCOUNTER — Ambulatory Visit (HOSPITAL_COMMUNITY): Payer: Federal, State, Local not specified - PPO | Attending: Cardiology

## 2013-12-04 DIAGNOSIS — I6529 Occlusion and stenosis of unspecified carotid artery: Secondary | ICD-10-CM | POA: Insufficient documentation

## 2013-12-04 MED ORDER — RIVASTIGMINE 9.5 MG/24HR TD PT24: 9.5000 mg | MEDICATED_PATCH | Freq: Every day | TRANSDERMAL | Status: DC

## 2013-12-04 NOTE — Telephone Encounter (Signed)
We have been sending the Rx for 30 days per fill.  Refills were sent to last until appt.  I tried to call patient back.  Phone rang over 20 times with no answer, no voicemail.

## 2013-12-04 NOTE — Telephone Encounter (Signed)
Pt called to schedule an apt set up for June but pt needs to get his prescription EXELON 9.5 MG/24HR monthly not 90 day supply since [employer] doesn't do an adjustment. Please call pt advising this can be done.

## 2013-12-20 ENCOUNTER — Other Ambulatory Visit: Payer: Self-pay | Admitting: Neurology

## 2013-12-20 ENCOUNTER — Telehealth: Payer: Self-pay | Admitting: Cardiology

## 2013-12-20 NOTE — Telephone Encounter (Signed)
Pt aware of results and to repeat in 6 months

## 2013-12-20 NOTE — Telephone Encounter (Signed)
Calling to give results of carotid doppler - stable 60 -79% repeat 6 months.

## 2013-12-20 NOTE — Telephone Encounter (Signed)
New message  Patient stated that [name] called him yesterday and he doesn't know why.. He will be going out of town today. He would like to know why they are trying to get in touch with him. Please call and advise.

## 2014-02-20 ENCOUNTER — Ambulatory Visit (INDEPENDENT_AMBULATORY_CARE_PROVIDER_SITE_OTHER): Payer: Federal, State, Local not specified - PPO | Admitting: Internal Medicine

## 2014-02-20 ENCOUNTER — Encounter: Payer: Self-pay | Admitting: Internal Medicine

## 2014-02-20 ENCOUNTER — Telehealth: Payer: Self-pay

## 2014-02-20 VITALS — BP 150/72 | HR 64 | Ht 69.0 in | Wt 200.4 lb

## 2014-02-20 DIAGNOSIS — R1013 Epigastric pain: Secondary | ICD-10-CM

## 2014-02-20 DIAGNOSIS — R197 Diarrhea, unspecified: Secondary | ICD-10-CM

## 2014-02-20 DIAGNOSIS — Z860100 Personal history of colon polyps, unspecified: Secondary | ICD-10-CM

## 2014-02-20 MED ORDER — DIPHENOXYLATE-ATROPINE 2.5-0.025 MG PO TABS: 2.0000 | ORAL_TABLET | Freq: Four times a day (QID) | ORAL | Status: DC | PRN

## 2014-02-20 NOTE — Progress Notes (Signed)
         Subjective:    Patient ID: [name], male    DOB: 1946/09/06, 68 y.o.   MRN: [id number]  HPI He is here with weeks-months of worsening diarrhea. Lomotil helps but also using loperamide. Also having intermittent post-prandial epigastric pain, "sever". Not on a PPI now. Similar to but worse than prior problems. Was due for routine colonoscopy last year  - has not done. Also had increasing abdominal pain and rectal bleeding in Fall 2014- saw PA, colonoscopy ordered, did not follow-through. Rectal bleeding has lessened.  Had labs at [facility] this year - no CBC4/2015. Had squamous cell carcinoma of skin removed, and atypical nevus. Medications, allergies, past medical history, past surgical history, family history and social history are reviewed and updated in the EMR.   Review of Systems Anxiety, stress - increased with new puppy    Objective:   Physical Exam General:  NAD Eyes:   anicteric Lungs:  clear Heart:  S1S2 no rubs, murmurs or gallops Abdomen:  soft and nontender, BS+ Ext:   no edema    Data Reviewed:  Prior GI notes      Assessment & Plan:  Diarrhea  Personal history of colonic polyps  Abdominal pain, epigastric  1. Schedule EGD and colonoscopy to evaluate. Hold Plavix if ok - query Dr. Leonie Man The risks and benefits as well as alternatives of endoscopic procedure(s) have been discussed and reviewed. All questions answered. The patient agrees to proceed. Refill Lomotil  NT:ZGYF,VCBSW, MD

## 2014-02-20 NOTE — Telephone Encounter (Signed)
Ok to hold plavix 5 days prior to clolonoscopy and resume later if ok with gastroenterologist

## 2014-02-20 NOTE — Telephone Encounter (Signed)
[facility]  [address]  [place] [address]  02/20/2014   RE: [name] DOB: 1946-04-01 MRN: [id number]   Dear Dr. Suzzanne Cloud,   We have scheduled the above patient for an endoscopic procedure. Our records show that he is on anticoagulation therapy.   Please advise as to how long the patient may come off his therapy of Plavix prior to the colonoscopy/EGD procedure, which is scheduled for 03/28/14.  Please fax back/ or route the completed form to Aisha Greenberger [place], [place] at [phone].   Sincerely,    Dr. Silvano Rusk

## 2014-02-20 NOTE — Patient Instructions (Addendum)
You have been scheduled for an endoscopy and colonoscopy with propofol. Please follow the written instructions given to you at your visit today. Please pick up your prep supplies before your procedure. If you use inhalers (even only as needed), please bring them with you on the day of your procedure.   You will be contaced by our office prior to your procedure for directions on holding your Plavix. If you do not hear from our office 1 week prior to your scheduled procedure, please call [phone] to discuss.   We will obtain your labs/office notes from the [place] in [place] for Dr. Carlean Purl to review.   I appreciate the opportunity to care for you.

## 2014-02-21 NOTE — Telephone Encounter (Signed)
Patient informed to hold the Plavix 5 days prior to colonoscopy, he verbalized understanding.

## 2014-03-01 ENCOUNTER — Encounter: Payer: Self-pay | Admitting: Internal Medicine

## 2014-03-12 ENCOUNTER — Encounter: Payer: Self-pay | Admitting: Neurology

## 2014-03-12 ENCOUNTER — Telehealth: Payer: Self-pay | Admitting: Neurology

## 2014-03-12 NOTE — Telephone Encounter (Signed)
Spoke to patient regarding rescheduling 03/26/14 appointment per Dr. Clydene Fake schedule, patient verbalized understanding. Printed and mailed letter with new appointment time.

## 2014-03-26 ENCOUNTER — Ambulatory Visit: Payer: Self-pay | Admitting: Neurology

## 2014-03-28 ENCOUNTER — Encounter: Payer: Self-pay | Admitting: Internal Medicine

## 2014-03-28 ENCOUNTER — Ambulatory Visit (AMBULATORY_SURGERY_CENTER): Payer: Federal, State, Local not specified - PPO | Admitting: Internal Medicine

## 2014-03-28 VITALS — BP 133/71 | HR 59 | Temp 97.8°F | Resp 29 | Ht 69.0 in | Wt 200.0 lb

## 2014-03-28 DIAGNOSIS — K589 Irritable bowel syndrome without diarrhea: Secondary | ICD-10-CM

## 2014-03-28 DIAGNOSIS — R1013 Epigastric pain: Secondary | ICD-10-CM

## 2014-03-28 DIAGNOSIS — K319 Disease of stomach and duodenum, unspecified: Secondary | ICD-10-CM

## 2014-03-28 DIAGNOSIS — Z86010 Personal history of colonic polyps: Secondary | ICD-10-CM

## 2014-03-28 DIAGNOSIS — R197 Diarrhea, unspecified: Secondary | ICD-10-CM

## 2014-03-28 DIAGNOSIS — K253 Acute gastric ulcer without hemorrhage or perforation: Secondary | ICD-10-CM

## 2014-03-28 DIAGNOSIS — D126 Benign neoplasm of colon, unspecified: Secondary | ICD-10-CM

## 2014-03-28 MED ORDER — SODIUM CHLORIDE 0.9 % IV SOLN: 500.0000 mL | INTRAVENOUS | Status: DC

## 2014-03-28 MED ORDER — PANTOPRAZOLE SODIUM 40 MG PO TBEC: 40.0000 mg | DELAYED_RELEASE_TABLET | Freq: Every day | ORAL | Status: DC

## 2014-03-28 MED ORDER — RIFAXIMIN 550 MG PO TABS: 550.0000 mg | ORAL_TABLET | Freq: Three times a day (TID) | ORAL | Status: AC

## 2014-03-28 NOTE — Progress Notes (Signed)
Called to room to assist during endoscopic procedure.  Patient ID and intended procedure confirmed with present staff. Received instructions for my participation in the procedure from the performing physician.  

## 2014-03-28 NOTE — Op Note (Signed)
[facility]  [address] [place], [address]   ENDOSCOPY PROCEDURE REPORT  PATIENT: [name], [name]  MR#: [id number] BIRTHDATE: 01-09-1946 , 57  yrs. old GENDER: Male ENDOSCOPIST: Gatha Mayer, MD, [facility] PROCEDURE DATE:  03/28/2014 PROCEDURE:  EGD w/ biopsy ASA CLASS:     Class III INDICATIONS:  Epigastric pain. MEDICATIONS: propofol (Diprivan) 200mg  IV, MAC sedation, administered by CRNA, and These medications were titrated to patient response per physician's verbal order TOPICAL ANESTHETIC: none  DESCRIPTION OF PROCEDURE: After the risks benefits and alternatives of the procedure were thoroughly explained, informed consent was obtained.  The    endoscope was introduced through the mouth and advanced to the second portion of the duodenum. Without limitations.  The instrument was slowly withdrawn as the mucosa was fully examined.        STOMACH: A small ulcer was found in the prepyloric region of the stomach.  Biopsies were taken at edge of the ulcer and at the center of the ulcer.  The remainder of the upper endoscopy exam was otherwise normal. Retroflexed views revealed no abnormalities.     The scope was then withdrawn from the patient and the procedure completed.  COMPLICATIONS: There were no complications. ENDOSCOPIC IMPRESSION: 1.   Small ulcer was found in the prepyloric region of the stomach - biopsied 2.   The remainder of the upper endoscopy exam was otherwise normal  RECOMMENDATIONS: 1.  PPI qam 2.  Proceed with a Colonoscopy.   eSigned:  Gatha Mayer, MD, [facility] 03/28/2014 3:49 PM   CC:The Patient  and Niger Reid, MD

## 2014-03-28 NOTE — Patient Instructions (Addendum)
There was a small ulcer in the stomach. Biopsies taken. Start pantoprazole 40 mg daily to heal this - pick up at pharmacy tonight or tomorrow.  One tiny polyp was removed from the colon. I took colon biopsies to see if there is any microscopic inflammation of the colon. Studies have shown that an antibiotic called Xifaxan helps stop diarrhea in some patients - I have prescribed this and want you to take it. Get it at your pharmacy today/tomorrow.  You also have hemorrhoids that bleed at times. We can discuss fixing them when you return for follow-up.  Will call with biopsy results and plan.  I appreciate the opportunity to care for you. Gatha Mayer, MD, FACG   YOU HAD AN ENDOSCOPIC PROCEDURE TODAY AT [facility]: Refer to the procedure report that was given to you for any specific questions about what was found during the examination.  If the procedure report does not answer your questions, please call your gastroenterologist to clarify.  If you requested that your care partner not be given the details of your procedure findings, then the procedure report has been included in a sealed envelope for you to review at your convenience later.  YOU SHOULD EXPECT: Some feelings of bloating in the abdomen. Passage of more gas than usual.  Walking can help get rid of the air that was put into your GI tract during the procedure and reduce the bloating. If you had a lower endoscopy (such as a colonoscopy or flexible sigmoidoscopy) you may notice spotting of blood in your stool or on the toilet paper. If you underwent a bowel prep for your procedure, then you may not have a normal bowel movement for a few days.  DIET: Your first meal following the procedure should be a light meal and then it is ok to progress to your normal diet.  A half-sandwich or bowl of soup is an example of a good first meal.  Heavy or fried foods are harder to digest and may make you feel nauseous or bloated.   Likewise meals heavy in dairy and vegetables can cause extra gas to form and this can also increase the bloating.  Drink plenty of fluids but you should avoid alcoholic beverages for 24 hours.  ACTIVITY: Your care partner should take you home directly after the procedure.  You should plan to take it easy, moving slowly for the rest of the day.  You can resume normal activity the day after the procedure however you should NOT DRIVE or use heavy machinery for 24 hours (because of the sedation medicines used during the test).    SYMPTOMS TO REPORT IMMEDIATELY: A gastroenterologist can be reached at any hour.  During normal business hours, 8:30 AM to 5:00 PM Monday through Friday, call [phone].  After hours and on weekends, please call the GI answering service at [phone] who will take a message and have the physician on call contact you.   Following lower endoscopy (colonoscopy or flexible sigmoidoscopy):  Excessive amounts of blood in the stool  Significant tenderness or worsening of abdominal pains  Swelling of the abdomen that is new, acute  Fever of 100F or higher  Following upper endoscopy (EGD)  Vomiting of blood or coffee ground material  New chest pain or pain under the shoulder blades  Painful or persistently difficult swallowing  New shortness of breath  Fever of 100F or higher  Black, tarry-looking stools  FOLLOW UP: If any biopsies were  taken you will be contacted by phone or by letter within the next 1-3 weeks.  Call your gastroenterologist if you have not heard about the biopsies in 3 weeks.  Our staff will call the home number listed on your records the next business day following your procedure to check on you and address any questions or concerns that you may have at that time regarding the information given to you following your procedure. This is a courtesy call and so if there is no answer at the home number and we have not heard from you through the emergency  physician on call, we will assume that you have returned to your regular daily activities without incident.  SIGNATURES/CONFIDENTIALITY: You and/or your care partner have signed paperwork which will be entered into your electronic medical record.  These signatures attest to the fact that that the information above on your After Visit Summary has been reviewed and is understood.  Full responsibility of the confidentiality of this discharge information lies with you and/or your care-partner.YOU HAD AN ENDOSCOPIC PROCEDURE TODAY AT [facility]: Refer to the procedure report that was given to you for any specific questions about what was found during the examination.  If the procedure report does not answer your questions, please call your gastroenterologist to clarify.  If you requested that your care partner not be given the details of your procedure findings, then the procedure report has been included in a sealed envelope for you to review at your convenience later.  YOU SHOULD EXPECT: Some feelings of bloating in the abdomen. Passage of more gas than usual.  Walking can help get rid of the air that was put into your GI tract during the procedure and reduce the bloating. If you had a lower endoscopy (such as a colonoscopy or flexible sigmoidoscopy) you may notice spotting of blood in your stool or on the toilet paper. If you underwent a bowel prep for your procedure, then you may not have a normal bowel movement for a few days.  DIET: Your first meal following the procedure should be a light meal and then it is ok to progress to your normal diet.  A half-sandwich or bowl of soup is an example of a good first meal.  Heavy or fried foods are harder to digest and may make you feel nauseous or bloated.  Likewise meals heavy in dairy and vegetables can cause extra gas to form and this can also increase the bloating.  Drink plenty of fluids but you should avoid alcoholic beverages for 24  hours.  ACTIVITY: Your care partner should take you home directly after the procedure.  You should plan to take it easy, moving slowly for the rest of the day.  You can resume normal activity the day after the procedure however you should NOT DRIVE or use heavy machinery for 24 hours (because of the sedation medicines used during the test).    SYMPTOMS TO REPORT IMMEDIATELY: A gastroenterologist can be reached at any hour.  During normal business hours, 8:30 AM to 5:00 PM Monday through Friday, call [phone].  After hours and on weekends, please call the GI answering service at [phone] who will take a message and have the physician on call contact you.   Following lower endoscopy (colonoscopy or flexible sigmoidoscopy):  Excessive amounts of blood in the stool  Significant tenderness or worsening of abdominal pains  Swelling of the abdomen that is new, acute  Fever of 100F or  higher  Following upper endoscopy (EGD)  Vomiting of blood or coffee ground material  New chest pain or pain under the shoulder blades  Painful or persistently difficult swallowing  New shortness of breath  Fever of 100F or higher  Black, tarry-looking stools  FOLLOW UP: If any biopsies were taken you will be contacted by phone or by letter within the next 1-3 weeks.  Call your gastroenterologist if you have not heard about the biopsies in 3 weeks.  Our staff will call the home number listed on your records the next business day following your procedure to check on you and address any questions or concerns that you may have at that time regarding the information given to you following your procedure. This is a courtesy call and so if there is no answer at the home number and we have not heard from you through the emergency physician on call, we will assume that you have returned to your regular daily activities without incident.  SIGNATURES/CONFIDENTIALITY: You and/or your care partner have signed  paperwork which will be entered into your electronic medical record.  These signatures attest to the fact that that the information above on your After Visit Summary has been reviewed and is understood.  Full responsibility of the confidentiality of this discharge information lies with you and/or your care-partner.  Polyp and hemorrhoid information given.    Pick up medication that Dr. Carlean Purl ordered for you today.  Resume plavix today.

## 2014-03-28 NOTE — Op Note (Signed)
[facility]  [address] [place], [address]   COLONOSCOPY PROCEDURE REPORT  PATIENT: [name], [name]  MR#: [id number] BIRTHDATE: August 27, 1946 , 6  yrs. old GENDER: Male ENDOSCOPIST: Gatha Mayer, MD, [facility] PROCEDURE DATE:  03/28/2014 PROCEDURE:   Colonoscopy with biopsy and snare polypectomy First Screening Colonoscopy - Avg.  risk and is 50 yrs.  old or older - No.  Prior Negative Screening - Now for repeat screening. N/A  History of Adenoma - Now for follow-up colonoscopy & has been > or = to 3 yrs.  Yes hx of adenoma.  Has been 3 or more years since last colonoscopy.  Polyps Removed Today? Yes. ASA CLASS:   Class III INDICATIONS:unexplained diarrhea and Patient's personal history of adenomatous colon polyps. MEDICATIONS: Propofol (Diprivan) 130 mg IV, MAC sedation, administered by CRNA, and These medications were titrated to patient response per physician's verbal order DESCRIPTION OF PROCEDURE:   After the risks benefits and alternatives of the procedure were thoroughly explained, informed consent was obtained.  A digital rectal exam revealed no abnormalities of the rectum, A digital rectal exam revealed no prostatic nodules, and A digital rectal exam revealed the prostate was not enlarged.   The LB [id number]  endoscope was introduced through the anus and advanced to the cecum, which was identified by both the appendix and ileocecal valve. No adverse events experienced.   The quality of the prep was good, using MiraLax  The instrument was then slowly withdrawn as the colon was fully examined.  COLON FINDINGS: A diminutive sessile polyp was found in the ascending colon.  A polypectomy was performed with a cold snare. The resection was complete and the polyp tissue was completely retrieved.   The colonic mucosa appeared otherwise normal throughout the entire examined colon.  Multiple random biopsies of the area were performed.   Moderate sized  internal hemorrhoids were found.  Retroflexed views revealed internal hemorrhoids. The time to cecum=4 minutes 16 seconds.  Withdrawal time=9 minutes 46 seconds.  The scope was withdrawn and the procedure completed.COMPLICATIONS: There were no complications.  ENDOSCOPIC IMPRESSION: 1.   Diminutive sessile polyp was found in the ascending colon; polypectomy was performed with a cold snare 2.   The colonic mucosa appeared otherwise normal throughout the entire examined colon; multiple random biopsies of the area were performed 3.   Moderate sized internal hemorrhoids  RECOMMENDATIONS: 1.  Timing of repeat colonoscopy will be determined by pathology findings. 2.  Office will call with the results. 3.   Xifaxan 550 mg tid x 14 days for diarrhea-IBS 4.   Consider bile acid sequestrant 5.   ? if fixed sigmoid colon related to his problems 6.    Consider hemorrhoid banding at some point  eSigned:  Gatha Mayer, MD, [facility] 03/28/2014 3:57 PM   cc: The Patient  and Niger Reid, MD

## 2014-03-28 NOTE — Progress Notes (Signed)
Report to PACU, RN, vss, BBS= Clear.  

## 2014-03-29 ENCOUNTER — Telehealth: Payer: Self-pay | Admitting: *Deleted

## 2014-03-29 NOTE — Telephone Encounter (Signed)
  Follow up Call-  Call back number 03/28/2014  Post procedure Call Back phone  # [phone]  Permission to leave phone message Yes     Patient questions:  Do you have a fever, pain , or abdominal swelling? No. Pain Score  0 *  Have you tolerated food without any problems? Yes.    Have you been able to return to your normal activities? Yes.    Do you have any questions about your discharge instructions: Diet   No. Medications  No. Follow up visit  No.  Do you have questions or concerns about your Care? No.  Actions: * If pain score is 4 or above: No action needed, pain <4.

## 2014-04-04 ENCOUNTER — Encounter: Payer: Self-pay | Admitting: Internal Medicine

## 2014-04-04 NOTE — Progress Notes (Signed)
Quick Note:  Call from office and explain stomach shows mild inflammation, polyp benign but pre-cancerous and colon lining ok - NO COLITIS  Stay on current Tx and see me in late Aug/Sept - schedule REV  [facility] - no letter but place colonoscopy recall June 2020  ______

## 2014-04-08 ENCOUNTER — Telehealth: Payer: Self-pay

## 2014-04-08 NOTE — Telephone Encounter (Signed)
Message copied by Greggory Keen on Mon Apr 08, 2014  1:52 PM ------      Message from: Silvano Rusk E      Created: Thu Apr 04, 2014  1:45 AM       Call from office and explain stomach shows mild inflammation, polyp benign but pre-cancerous and colon lining ok - NO COLITIS            Stay on current Tx and see me in late Aug/Sept - schedule REV            [facility] - no letter but place colonoscopy recall June 2020       ------

## 2014-04-08 NOTE — Telephone Encounter (Signed)
Left message to call back on home and cell voicemail

## 2014-04-09 ENCOUNTER — Other Ambulatory Visit: Payer: Self-pay | Admitting: Internal Medicine

## 2014-04-09 NOTE — Telephone Encounter (Signed)
Please advise regarding refill Sir, thank you. 

## 2014-04-09 NOTE — Telephone Encounter (Signed)
This was just prescribed so should not need refill  Please find out what the problem is.

## 2014-04-09 NOTE — Telephone Encounter (Signed)
14 days only

## 2014-04-09 NOTE — Telephone Encounter (Signed)
Called and LM on patient's mobile VM that refill on xifaxin not needed and I left details on taking his pantoprazole once a day.

## 2014-04-09 NOTE — Telephone Encounter (Signed)
Patient did pick up rx for xifaxin on 03/28/14.  Did you want him to continue or was it just a 14 day plan of treatment Sir?

## 2014-04-10 ENCOUNTER — Ambulatory Visit (INDEPENDENT_AMBULATORY_CARE_PROVIDER_SITE_OTHER): Payer: Federal, State, Local not specified - PPO | Admitting: Neurology

## 2014-04-10 ENCOUNTER — Encounter: Payer: Self-pay | Admitting: *Deleted

## 2014-04-10 VITALS — BP 141/76 | HR 60 | Ht 69.0 in | Wt 199.0 lb

## 2014-04-10 DIAGNOSIS — F05 Delirium due to known physiological condition: Principal | ICD-10-CM

## 2014-04-10 DIAGNOSIS — R410 Disorientation, unspecified: Secondary | ICD-10-CM

## 2014-04-10 MED ORDER — RIVASTIGMINE 9.5 MG/24HR TD PT24: 9.5000 mg | MEDICATED_PATCH | Freq: Every day | TRANSDERMAL | Status: DC

## 2014-04-10 NOTE — Telephone Encounter (Signed)
Left message to call back on cell phone

## 2014-04-10 NOTE — Patient Instructions (Signed)
I had a long discussion the patient and daughter regarding his mild dementia and cerebrovascular disease and answered questions. Continue Exelon the current dose of 9.5 mg/24-hour patch and patient was given refills. Check screening carotid entrance Doppler studies. Continue clopidogrel 75 mg orally every day  for secondary stroke prevention and maintain strict control of hypertension with blood pressure goal below 130/90, diabetes with hemoglobin A1c goal below 6.5% and lipids with LDL cholesterol goal below 100 mg/dL. Return for followup in 6 months or earlier if necessary.

## 2014-04-10 NOTE — Telephone Encounter (Signed)
Patient aware of results.
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
## 2014-04-20 ENCOUNTER — Other Ambulatory Visit: Payer: Self-pay | Admitting: Neurology

## 2014-06-04 ENCOUNTER — Ambulatory Visit (INDEPENDENT_AMBULATORY_CARE_PROVIDER_SITE_OTHER): Payer: Federal, State, Local not specified - PPO | Admitting: Cardiology

## 2014-06-04 ENCOUNTER — Encounter: Payer: Self-pay | Admitting: Cardiology

## 2014-06-04 VITALS — BP 130/76 | HR 80 | Wt 197.0 lb

## 2014-06-04 DIAGNOSIS — I6529 Occlusion and stenosis of unspecified carotid artery: Secondary | ICD-10-CM

## 2014-06-04 DIAGNOSIS — I2510 Atherosclerotic heart disease of native coronary artery without angina pectoris: Secondary | ICD-10-CM

## 2014-06-04 DIAGNOSIS — I10 Essential (primary) hypertension: Secondary | ICD-10-CM

## 2014-06-04 DIAGNOSIS — I6350 Cerebral infarction due to unspecified occlusion or stenosis of unspecified cerebral artery: Secondary | ICD-10-CM

## 2014-06-04 NOTE — Patient Instructions (Addendum)
We are ordering a stress test called    lexiscan [email or web address]  We will schedule an appt. for you to be  Seen at [facility]  to have your carotids checked  You will need to get some stockings at [employer] in [place]  Phone : [phone] (  Compression stockings )
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
## 2014-06-05 ENCOUNTER — Telehealth (HOSPITAL_COMMUNITY): Payer: Self-pay

## 2014-06-05 NOTE — Telephone Encounter (Signed)
Encounter complete. 

## 2014-06-06 ENCOUNTER — Ambulatory Visit (HOSPITAL_COMMUNITY)
Admission: RE | Admit: 2014-06-06 | Discharge: 2014-06-06 | Disposition: A | Discharge status: Home / Self Care | Payer: Federal, State, Local not specified - PPO | Source: Ambulatory Visit | Attending: Internal Medicine | Admitting: Internal Medicine

## 2014-06-06 DIAGNOSIS — I739 Peripheral vascular disease, unspecified: Secondary | ICD-10-CM | POA: Diagnosis not present

## 2014-06-06 DIAGNOSIS — I6529 Occlusion and stenosis of unspecified carotid artery: Secondary | ICD-10-CM

## 2014-06-06 DIAGNOSIS — Z87891 Personal history of nicotine dependence: Secondary | ICD-10-CM | POA: Insufficient documentation

## 2014-06-06 DIAGNOSIS — E119 Type 2 diabetes mellitus without complications: Secondary | ICD-10-CM | POA: Diagnosis not present

## 2014-06-06 DIAGNOSIS — I10 Essential (primary) hypertension: Secondary | ICD-10-CM | POA: Insufficient documentation

## 2014-06-06 DIAGNOSIS — I6350 Cerebral infarction due to unspecified occlusion or stenosis of unspecified cerebral artery: Secondary | ICD-10-CM

## 2014-06-06 DIAGNOSIS — Z951 Presence of aortocoronary bypass graft: Secondary | ICD-10-CM | POA: Diagnosis not present

## 2014-06-06 DIAGNOSIS — J449 Chronic obstructive pulmonary disease, unspecified: Secondary | ICD-10-CM | POA: Insufficient documentation

## 2014-06-06 DIAGNOSIS — Z8673 Personal history of transient ischemic attack (TIA), and cerebral infarction without residual deficits: Secondary | ICD-10-CM | POA: Insufficient documentation

## 2014-06-06 DIAGNOSIS — J4489 Other specified chronic obstructive pulmonary disease: Secondary | ICD-10-CM | POA: Insufficient documentation

## 2014-06-06 DIAGNOSIS — I2510 Atherosclerotic heart disease of native coronary artery without angina pectoris: Secondary | ICD-10-CM | POA: Insufficient documentation

## 2014-06-06 DIAGNOSIS — F0390 Unspecified dementia without behavioral disturbance: Secondary | ICD-10-CM | POA: Diagnosis not present

## 2014-06-06 DIAGNOSIS — Z8249 Family history of ischemic heart disease and other diseases of the circulatory system: Secondary | ICD-10-CM | POA: Insufficient documentation

## 2014-06-06 MED ORDER — TECHNETIUM TC 99M SESTAMIBI GENERIC - CARDIOLITE
30.0000 | Freq: Once | INTRAVENOUS | Status: AC | PRN
  Administered 2014-06-06: 30 via INTRAVENOUS

## 2014-06-06 MED ORDER — AMINOPHYLLINE 25 MG/ML IV SOLN
75.0000 mg | Freq: Once | INTRAVENOUS | Status: AC
  Administered 2014-06-06: 75 mg via INTRAVENOUS

## 2014-06-06 MED ORDER — REGADENOSON 0.4 MG/5ML IV SOLN
0.4000 mg | Freq: Once | INTRAVENOUS | Status: AC
  Administered 2014-06-06: 0.4 mg via INTRAVENOUS

## 2014-06-06 MED ORDER — TECHNETIUM TC 99M SESTAMIBI GENERIC - CARDIOLITE
10.0000 | Freq: Once | INTRAVENOUS | Status: AC | PRN
  Administered 2014-06-06: 10 via INTRAVENOUS
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
## 2014-06-07 ENCOUNTER — Ambulatory Visit: Payer: Federal, State, Local not specified - PPO | Admitting: Internal Medicine

## 2014-06-12 ENCOUNTER — Ambulatory Visit: Payer: Federal, State, Local not specified - PPO | Admitting: Internal Medicine

## 2014-06-18 ENCOUNTER — Telehealth: Payer: Self-pay | Admitting: Cardiology

## 2014-06-18 NOTE — Telephone Encounter (Signed)
Pt. Cancelled his appt. At [facility] and didn't want to reschedule per dana at [facility]

## 2014-06-18 NOTE — Telephone Encounter (Signed)
[name] is calling to inform us that [name] , has cancel his appointment and did not want to reschedule .Marland Kitchen Thanks

## 2014-06-25 ENCOUNTER — Other Ambulatory Visit: Payer: Self-pay

## 2014-06-25 DIAGNOSIS — I714 Abdominal aortic aneurysm, without rupture, unspecified: Secondary | ICD-10-CM

## 2014-06-25 DIAGNOSIS — I6529 Occlusion and stenosis of unspecified carotid artery: Secondary | ICD-10-CM

## 2014-07-17 ENCOUNTER — Other Ambulatory Visit (HOSPITAL_COMMUNITY): Payer: Federal, State, Local not specified - PPO

## 2014-07-17 ENCOUNTER — Encounter: Payer: Federal, State, Local not specified - PPO | Admitting: Vascular Surgery

## 2014-07-30 ENCOUNTER — Encounter: Payer: Self-pay | Admitting: Vascular Surgery

## 2014-07-31 ENCOUNTER — Encounter: Payer: Self-pay | Admitting: Vascular Surgery

## 2014-07-31 ENCOUNTER — Ambulatory Visit (INDEPENDENT_AMBULATORY_CARE_PROVIDER_SITE_OTHER): Payer: Federal, State, Local not specified - PPO | Admitting: Vascular Surgery

## 2014-07-31 ENCOUNTER — Ambulatory Visit (HOSPITAL_COMMUNITY)
Admission: RE | Admit: 2014-07-31 | Discharge: 2014-07-31 | Disposition: A | Discharge status: Home / Self Care | Payer: Federal, State, Local not specified - PPO | Source: Ambulatory Visit | Attending: Vascular Surgery | Admitting: Vascular Surgery

## 2014-07-31 ENCOUNTER — Ambulatory Visit (INDEPENDENT_AMBULATORY_CARE_PROVIDER_SITE_OTHER)
Admission: RE | Admit: 2014-07-31 | Discharge: 2014-07-31 | Disposition: A | Discharge status: Home / Self Care | Payer: Federal, State, Local not specified - PPO | Source: Ambulatory Visit | Attending: Vascular Surgery | Admitting: Vascular Surgery

## 2014-07-31 VITALS — BP 136/83 | HR 69 | Ht 69.0 in | Wt 192.3 lb

## 2014-07-31 DIAGNOSIS — I714 Abdominal aortic aneurysm, without rupture, unspecified: Secondary | ICD-10-CM

## 2014-07-31 DIAGNOSIS — I6523 Occlusion and stenosis of bilateral carotid arteries: Secondary | ICD-10-CM

## 2014-07-31 DIAGNOSIS — I6529 Occlusion and stenosis of unspecified carotid artery: Secondary | ICD-10-CM

## 2014-07-31 DIAGNOSIS — R29898 Other symptoms and signs involving the musculoskeletal system: Secondary | ICD-10-CM

## 2014-07-31 NOTE — Assessment & Plan Note (Signed)
His abdominal aortic aneurysm measures 4.1 cm in maximum diameter. Thus there has been no significant change since his CT scan which was done back in 2013. I have ordered a follow up duplex scan in 6 months and I'll see him back at that time. If there is been no change in rhythm at that time I will extend his follow up out to 1 year. There is some association of popliteal artery aneurysms with abdominal aortic aneurysms, therefore at his next visit we will obtain a duplex of both popliteal arteries to rule out aneurysms in these locations. The patient is not a smoker. His blood pressure appears to be under good control. I'll see him back in 6 months.

## 2014-07-31 NOTE — Addendum Note (Signed)
Addended by: Mena Goes on: 07/31/2014 01:24 PM   Modules accepted: Orders

## 2014-07-31 NOTE — Assessment & Plan Note (Signed)
I haven't apparently interpreted his carotid duplex scan which shows that he has a 40-59% left carotid stenosis. Given the right internal carotid artery occlusion these velocities may be falsely elevated. Regardless the stenosis on the left this point is only mild to moderate. He is not a smoker. He is on Plavix and is on a statin. I have ordered a follow up carotid duplex in 6 months and I'll see him back at that time. He knows to call sooner if he has problems.
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
## 2014-09-19 ENCOUNTER — Other Ambulatory Visit: Payer: Self-pay | Admitting: Neurology

## 2014-10-24 ENCOUNTER — Encounter: Payer: Self-pay | Admitting: Neurology

## 2014-10-24 ENCOUNTER — Ambulatory Visit (INDEPENDENT_AMBULATORY_CARE_PROVIDER_SITE_OTHER): Payer: Federal, State, Local not specified - PPO | Admitting: Neurology

## 2014-10-24 VITALS — BP 155/89 | HR 69 | Ht 69.0 in | Wt 178.0 lb

## 2014-10-24 DIAGNOSIS — F0390 Unspecified dementia without behavioral disturbance: Secondary | ICD-10-CM

## 2014-10-24 NOTE — Patient Instructions (Signed)
I had a long discussion with the patient and his wife regarding his mild dementia as well as remote stroke, or risk of stroke recurrence, need for aggressive risk factor modification and answered questions. Continue Exelon patch for his dementia and Plavix for stroke prevention with strict control of hypertension with blood pressure goal below 130/90 and lipids with LDL cholesterol goal below 100 mg percent. Return for follow-up in 6 months with Lanae Crumbly, nurse practitioner  Or call earlier if necessary

## 2014-10-25 ENCOUNTER — Ambulatory Visit: Payer: Federal, State, Local not specified - PPO | Admitting: Neurology
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
## 2014-11-20 ENCOUNTER — Other Ambulatory Visit: Payer: Self-pay | Admitting: Neurology

## 2015-01-27 ENCOUNTER — Ambulatory Visit (INDEPENDENT_AMBULATORY_CARE_PROVIDER_SITE_OTHER)
Admission: RE | Admit: 2015-01-27 | Discharge: 2015-01-27 | Disposition: A | Discharge status: Home / Self Care | Payer: Federal, State, Local not specified - PPO | Source: Ambulatory Visit | Attending: Vascular Surgery | Admitting: Vascular Surgery

## 2015-01-27 ENCOUNTER — Ambulatory Visit (HOSPITAL_COMMUNITY)
Admission: RE | Admit: 2015-01-27 | Discharge: 2015-01-27 | Disposition: A | Discharge status: Home / Self Care | Payer: Federal, State, Local not specified - PPO | Source: Ambulatory Visit | Attending: Vascular Surgery | Admitting: Vascular Surgery

## 2015-01-27 DIAGNOSIS — I6529 Occlusion and stenosis of unspecified carotid artery: Secondary | ICD-10-CM

## 2015-01-27 DIAGNOSIS — I714 Abdominal aortic aneurysm, without rupture, unspecified: Secondary | ICD-10-CM

## 2015-01-27 DIAGNOSIS — I6523 Occlusion and stenosis of bilateral carotid arteries: Secondary | ICD-10-CM | POA: Insufficient documentation

## 2015-01-27 DIAGNOSIS — R29898 Other symptoms and signs involving the musculoskeletal system: Secondary | ICD-10-CM | POA: Insufficient documentation

## 2015-01-29 ENCOUNTER — Other Ambulatory Visit (HOSPITAL_COMMUNITY): Payer: Federal, State, Local not specified - PPO

## 2015-01-29 ENCOUNTER — Ambulatory Visit: Payer: Federal, State, Local not specified - PPO | Admitting: Vascular Surgery

## 2015-02-04 ENCOUNTER — Encounter: Payer: Self-pay | Admitting: Vascular Surgery

## 2015-02-05 ENCOUNTER — Other Ambulatory Visit (HOSPITAL_COMMUNITY): Payer: Federal, State, Local not specified - PPO

## 2015-02-05 ENCOUNTER — Ambulatory Visit (INDEPENDENT_AMBULATORY_CARE_PROVIDER_SITE_OTHER): Payer: Federal, State, Local not specified - PPO | Admitting: Vascular Surgery

## 2015-02-05 ENCOUNTER — Encounter: Payer: Self-pay | Admitting: Vascular Surgery

## 2015-02-05 VITALS — BP 139/75 | HR 64 | Ht 69.0 in | Wt 184.2 lb

## 2015-02-05 DIAGNOSIS — I714 Abdominal aortic aneurysm, without rupture, unspecified: Secondary | ICD-10-CM

## 2015-02-05 DIAGNOSIS — I6523 Occlusion and stenosis of bilateral carotid arteries: Secondary | ICD-10-CM
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
## 2015-02-06 NOTE — Addendum Note (Signed)
Addended by: Mena Goes on: 02/06/2015 12:08 PM   Modules accepted: Orders

## 2015-04-17 ENCOUNTER — Other Ambulatory Visit: Payer: Self-pay | Admitting: Neurology

## 2015-04-24 ENCOUNTER — Other Ambulatory Visit: Payer: Self-pay | Admitting: Internal Medicine

## 2015-04-24 ENCOUNTER — Ambulatory Visit (INDEPENDENT_AMBULATORY_CARE_PROVIDER_SITE_OTHER): Payer: Federal, State, Local not specified - PPO | Admitting: Adult Health

## 2015-04-24 ENCOUNTER — Encounter: Payer: Self-pay | Admitting: Adult Health

## 2015-04-24 VITALS — BP 125/66 | HR 67 | Ht 69.0 in | Wt 176.0 lb

## 2015-04-24 DIAGNOSIS — G3184 Mild cognitive impairment, so stated: Secondary | ICD-10-CM

## 2015-04-24 NOTE — Progress Notes (Signed)
I agree with the above plan 

## 2015-04-24 NOTE — Patient Instructions (Signed)
Continue Exelon patch.  If your symptoms worsen or you develop new symptoms please let us know.

## 2015-04-24 NOTE — Telephone Encounter (Signed)
Not seen in a year Needs REV to refill - controlled substance

## 2015-04-24 NOTE — Telephone Encounter (Signed)
Ok to refill Sir? 
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
## 2015-05-23 ENCOUNTER — Other Ambulatory Visit: Payer: Self-pay | Admitting: Neurology

## 2015-06-02 ENCOUNTER — Encounter: Payer: Self-pay | Admitting: Internal Medicine

## 2015-10-27 ENCOUNTER — Encounter: Payer: Self-pay | Admitting: Adult Health

## 2015-10-27 ENCOUNTER — Ambulatory Visit (INDEPENDENT_AMBULATORY_CARE_PROVIDER_SITE_OTHER): Payer: Federal, State, Local not specified - PPO | Admitting: Adult Health

## 2015-10-27 VITALS — BP 94/55 | HR 72 | Ht 69.0 in | Wt 184.0 lb

## 2015-10-27 DIAGNOSIS — R413 Other amnesia: Secondary | ICD-10-CM | POA: Diagnosis not present

## 2015-10-27 MED ORDER — RIVASTIGMINE 9.5 MG/24HR TD PT24: MEDICATED_PATCH | TRANSDERMAL | Status: DC

## 2015-10-27 NOTE — Patient Instructions (Signed)
Memory score is stable Continue Exelon patch- refill sent If your symptoms worsen or you develop new symptoms please let us know.

## 2015-10-27 NOTE — Progress Notes (Signed)
I have read the note, and I agree with the clinical assessment and plan.  Kalee Broxton KEITH   
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
## 2016-02-06 ENCOUNTER — Encounter: Payer: Self-pay | Admitting: Family

## 2016-02-11 ENCOUNTER — Ambulatory Visit (HOSPITAL_COMMUNITY): Payer: Federal, State, Local not specified - PPO

## 2016-02-11 ENCOUNTER — Ambulatory Visit: Payer: Federal, State, Local not specified - PPO | Admitting: Family

## 2016-02-15 ENCOUNTER — Emergency Department (HOSPITAL_COMMUNITY)
Admission: EM | Admit: 2016-02-15 | Discharge: 2016-02-15 | Disposition: A | Discharge status: Home / Self Care | Payer: Federal, State, Local not specified - PPO | Attending: Emergency Medicine | Admitting: Emergency Medicine

## 2016-02-15 ENCOUNTER — Encounter (HOSPITAL_COMMUNITY): Payer: Self-pay | Admitting: *Deleted

## 2016-02-15 DIAGNOSIS — Y998 Other external cause status: Secondary | ICD-10-CM | POA: Diagnosis not present

## 2016-02-15 DIAGNOSIS — Y9389 Activity, other specified: Secondary | ICD-10-CM | POA: Insufficient documentation

## 2016-02-15 DIAGNOSIS — Y9289 Other specified places as the place of occurrence of the external cause: Secondary | ICD-10-CM | POA: Insufficient documentation

## 2016-02-15 DIAGNOSIS — Z7902 Long term (current) use of antithrombotics/antiplatelets: Secondary | ICD-10-CM | POA: Diagnosis not present

## 2016-02-15 DIAGNOSIS — S71112A Laceration without foreign body, left thigh, initial encounter: Secondary | ICD-10-CM

## 2016-02-15 DIAGNOSIS — F329 Major depressive disorder, single episode, unspecified: Secondary | ICD-10-CM | POA: Insufficient documentation

## 2016-02-15 DIAGNOSIS — Z87891 Personal history of nicotine dependence: Secondary | ICD-10-CM | POA: Insufficient documentation

## 2016-02-15 DIAGNOSIS — Z86010 Personal history of colonic polyps: Secondary | ICD-10-CM | POA: Insufficient documentation

## 2016-02-15 DIAGNOSIS — Z862 Personal history of diseases of the blood and blood-forming organs and certain disorders involving the immune mechanism: Secondary | ICD-10-CM | POA: Insufficient documentation

## 2016-02-15 DIAGNOSIS — J449 Chronic obstructive pulmonary disease, unspecified: Secondary | ICD-10-CM | POA: Diagnosis not present

## 2016-02-15 DIAGNOSIS — K219 Gastro-esophageal reflux disease without esophagitis: Secondary | ICD-10-CM | POA: Diagnosis not present

## 2016-02-15 DIAGNOSIS — Z23 Encounter for immunization: Secondary | ICD-10-CM | POA: Diagnosis not present

## 2016-02-15 DIAGNOSIS — I10 Essential (primary) hypertension: Secondary | ICD-10-CM | POA: Insufficient documentation

## 2016-02-15 DIAGNOSIS — I25119 Atherosclerotic heart disease of native coronary artery with unspecified angina pectoris: Secondary | ICD-10-CM | POA: Diagnosis not present

## 2016-02-15 DIAGNOSIS — Z8701 Personal history of pneumonia (recurrent): Secondary | ICD-10-CM | POA: Insufficient documentation

## 2016-02-15 DIAGNOSIS — E119 Type 2 diabetes mellitus without complications: Secondary | ICD-10-CM | POA: Diagnosis not present

## 2016-02-15 DIAGNOSIS — G8929 Other chronic pain: Secondary | ICD-10-CM | POA: Insufficient documentation

## 2016-02-15 DIAGNOSIS — E669 Obesity, unspecified: Secondary | ICD-10-CM | POA: Insufficient documentation

## 2016-02-15 DIAGNOSIS — W270XXA Contact with workbench tool, initial encounter: Secondary | ICD-10-CM | POA: Insufficient documentation

## 2016-02-15 MED ORDER — BACITRACIN ZINC 500 UNIT/GM EX OINT
1.0000 "application " | TOPICAL_OINTMENT | Freq: Two times a day (BID) | CUTANEOUS | Status: DC
  Administered 2016-02-15: 1 via TOPICAL

## 2016-02-15 MED ORDER — TETANUS-DIPHTH-ACELL PERTUSSIS 5-2.5-18.5 LF-MCG/0.5 IM SUSP
0.5000 mL | Freq: Once | INTRAMUSCULAR | Status: AC
  Administered 2016-02-15: 0.5 mL via INTRAMUSCULAR
  Filled 2016-02-15: qty 0.5

## 2016-02-15 MED ORDER — LIDOCAINE-EPINEPHRINE (PF) 2 %-1:200000 IJ SOLN
10.0000 mL | Freq: Once | INTRAMUSCULAR | Status: AC
  Administered 2016-02-15: 10 mL
  Filled 2016-02-15: qty 20
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
## 2016-02-15 NOTE — Discharge Instructions (Signed)

## 2016-02-15 NOTE — ED Notes (Signed)
Pt states L leg lac d/t skill saw.  Bleeding controlled.

## 2016-02-15 NOTE — ED Notes (Signed)
Pt has 4 inch lac to left thigh from a skill saw.  Bleeding controlled and patient has palpable DPP.  Pt unknown last tetanus.

## 2016-03-31 ENCOUNTER — Telehealth: Payer: Self-pay | Admitting: Neurology

## 2016-03-31 NOTE — Telephone Encounter (Signed)
Patient has had confusional events like this in the past. If he would like to be seen sooner I will be happy to see him. Or he can continue to monitor his symptoms and if he has anymore confusional events he can let us know.
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
## 2016-04-01 NOTE — Telephone Encounter (Signed)
Rn call patient back about Jinny Blossom advice. Rn stated we can see him early if he would like. Pt has an appt on July 31 with Dr. Leonie Man. Pts appt move to July 6 at 0230, pt needs to arrive at 0200pm. Pt verbalized understanding. Pt is being seen for confusional events.

## 2016-04-08 ENCOUNTER — Encounter: Payer: Self-pay | Admitting: Neurology

## 2016-04-08 ENCOUNTER — Ambulatory Visit (INDEPENDENT_AMBULATORY_CARE_PROVIDER_SITE_OTHER): Payer: Federal, State, Local not specified - PPO | Admitting: Neurology

## 2016-04-08 VITALS — BP 94/60 | HR 70 | Ht 69.0 in | Wt 169.8 lb

## 2016-04-08 DIAGNOSIS — E785 Hyperlipidemia, unspecified: Secondary | ICD-10-CM | POA: Diagnosis not present

## 2016-04-08 DIAGNOSIS — G3184 Mild cognitive impairment, so stated: Secondary | ICD-10-CM

## 2016-04-08 DIAGNOSIS — I6529 Occlusion and stenosis of unspecified carotid artery: Secondary | ICD-10-CM

## 2016-04-08 NOTE — Patient Instructions (Signed)
I had a long discussion with the patient regarding his mild cognitive impairment which appears to be stable. Continue Exelon patch and the current dose of 9.5 mg/24-hour and participate  in mentally challenging activities like solving crossword puzzles, sudoku and playing bridge. Continue Plavix for stroke prevention and strict control of lipids with LDL cholesterol goal below 70 mg percent. Check fasting lipid profile and complete metabolic panel labs as well as carotid ultrasound. Return for follow-up in a year or call earlier if necessary.
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
## 2016-04-21 ENCOUNTER — Ambulatory Visit (INDEPENDENT_AMBULATORY_CARE_PROVIDER_SITE_OTHER): Payer: Federal, State, Local not specified - PPO

## 2016-04-21 DIAGNOSIS — I6529 Occlusion and stenosis of unspecified carotid artery: Secondary | ICD-10-CM

## 2016-04-30 ENCOUNTER — Other Ambulatory Visit (INDEPENDENT_AMBULATORY_CARE_PROVIDER_SITE_OTHER): Payer: Self-pay

## 2016-04-30 DIAGNOSIS — Z0289 Encounter for other administrative examinations: Secondary | ICD-10-CM

## 2016-05-01 LAB — COMPREHENSIVE METABOLIC PANEL
ALT: 21 IU/L (ref 0–44)
AST: 14 IU/L (ref 0–40)
Albumin/Globulin Ratio: 2.1 (ref 1.2–2.2)
Albumin: 4.2 g/dL (ref 3.5–4.8)
Alkaline Phosphatase: 70 IU/L (ref 39–117)
BILIRUBIN TOTAL: 0.4 mg/dL (ref 0.0–1.2)
BUN/Creatinine Ratio: 12 (ref 10–24)
BUN: 17 mg/dL (ref 8–27)
CALCIUM: 9 mg/dL (ref 8.6–10.2)
CHLORIDE: 103 mmol/L (ref 96–106)
CO2: 22 mmol/L (ref 18–29)
Creatinine, Ser: 1.41 mg/dL — ABNORMAL HIGH (ref 0.76–1.27)
GFR, EST AFRICAN AMERICAN: 58 mL/min/{1.73_m2} — AB (ref >59)
GFR, EST NON AFRICAN AMERICAN: 50 mL/min/{1.73_m2} — AB (ref >59)
GLOBULIN, TOTAL: 2 g/dL (ref 1.5–4.5)
Glucose: 101 mg/dL — ABNORMAL HIGH (ref 65–99)
Potassium: 5.3 mmol/L — ABNORMAL HIGH (ref 3.5–5.2)
SODIUM: 141 mmol/L (ref 134–144)
Total Protein: 6.2 g/dL (ref 6.0–8.5)

## 2016-05-01 LAB — LIPID PANEL
CHOLESTEROL TOTAL: 150 mg/dL (ref 100–199)
Chol/HDL Ratio: 3.7 ratio units (ref 0.0–5.0)
HDL: 41 mg/dL (ref >39)
LDL Calculated: 80 mg/dL (ref 0–99)
TRIGLYCERIDES: 147 mg/dL (ref 0–149)
VLDL CHOLESTEROL CAL: 29 mg/dL (ref 5–40)

## 2016-05-03 ENCOUNTER — Ambulatory Visit: Payer: Federal, State, Local not specified - PPO | Admitting: Neurology

## 2016-05-06 ENCOUNTER — Telehealth: Payer: Self-pay

## 2016-05-06 NOTE — Telephone Encounter (Signed)
Daughter Joellen Jersey  [phone] called regarding lab results. Advised no DPR on file, will relay to nurse that she has called regarding results.

## 2016-05-06 NOTE — Telephone Encounter (Signed)
Patient called back wanting to know when someone is going to call with his results, patient advised, they are seeing patient's right now, will call when they are not with patient's.

## 2016-05-06 NOTE — Telephone Encounter (Signed)
UNable to leave message on home phone . VM not set up. Returning pts phone call.

## 2016-05-06 NOTE — Telephone Encounter (Addendum)
Rn call patient about his lab work.Rn stated that his cholesterol is borderline but acceptable but potassium and renal function is slightly high and he needs to see his primary MD for that. Carotid dopplers 04/21/16 show no change and stable moderate left ICA stenosis. Pt verbalized understanding. Rn stated a copy will be fax to his PCP. Pt stated he will call to schedule an appt.

## 2016-05-06 NOTE — Telephone Encounter (Signed)
-----   Message from [name], MD sent at 05/03/2016  9:05 AM EDT ----- Mitchell Heir call patient and let him know that cholesterol is borderline but acceptable but potassium and renal function is slightly high and he needs to see his primary MD for that. Carotid dopplers 04/21/16 show no change and stable moderate left ICA stenosis.

## 2016-05-06 NOTE — Telephone Encounter (Signed)
Patient is returning your call. He can be reached at [phone].

## 2016-05-06 NOTE — Telephone Encounter (Signed)
Notes Recorded by Marval Regal, RN on 05/06/2016 at 11:52 AM EDT LFt vm on patients vm to return phone call. ------  Notes Recorded by Marval Regal, RN on 05/06/2016 at 11:48 AM EDT Unable to give patient lab results. Pts home number just rang and cell vm was not working. Will fax labs to PCP. ------

## 2016-05-06 NOTE — Telephone Encounter (Signed)
Patient returned Katrina's call, please call [phone].

## 2016-05-10 NOTE — Patient Instructions (Signed)
Pt came by office today to pick up a copy of his most recent labs ordered by Dr. Leonie Man. Pt signed a MR release and signed a new DPR granting [place] permission to speak to his daughter.

## 2016-05-11 NOTE — Telephone Encounter (Signed)
COpy of lab work fax again to Dr. Niger Reid at [phone]. Labs were fax 05/06/2016 and it stated fail. Rn fax the lab results and recommendations again to [phone] three times and it was confirm.

## 2017-04-20 ENCOUNTER — Ambulatory Visit: Payer: Federal, State, Local not specified - PPO | Admitting: Neurology

## 2017-06-02 ENCOUNTER — Ambulatory Visit: Payer: Federal, State, Local not specified - PPO | Admitting: Neurology

## 2019-03-29 ENCOUNTER — Encounter: Payer: Self-pay | Admitting: Internal Medicine

## 2021-11-16 ENCOUNTER — Emergency Department (HOSPITAL_COMMUNITY): Payer: No Typology Code available for payment source

## 2021-11-16 ENCOUNTER — Encounter (HOSPITAL_COMMUNITY): Payer: Self-pay | Admitting: Emergency Medicine

## 2021-11-16 ENCOUNTER — Inpatient Hospital Stay (HOSPITAL_COMMUNITY)
Admission: EM | Admit: 2021-11-16 | Discharge: 2021-11-20 | DRG: 683 | Disposition: A | Discharge status: Skilled Nursing Facility | Payer: No Typology Code available for payment source | Attending: Internal Medicine | Admitting: Internal Medicine

## 2021-11-16 DIAGNOSIS — Z808 Family history of malignant neoplasm of other organs or systems: Secondary | ICD-10-CM

## 2021-11-16 DIAGNOSIS — Z951 Presence of aortocoronary bypass graft: Secondary | ICD-10-CM

## 2021-11-16 DIAGNOSIS — K253 Acute gastric ulcer without hemorrhage or perforation: Secondary | ICD-10-CM

## 2021-11-16 DIAGNOSIS — Z79899 Other long term (current) drug therapy: Secondary | ICD-10-CM

## 2021-11-16 DIAGNOSIS — I10 Essential (primary) hypertension: Secondary | ICD-10-CM | POA: Diagnosis present

## 2021-11-16 DIAGNOSIS — E440 Moderate protein-calorie malnutrition: Secondary | ICD-10-CM | POA: Insufficient documentation

## 2021-11-16 DIAGNOSIS — E872 Acidosis, unspecified: Secondary | ICD-10-CM

## 2021-11-16 DIAGNOSIS — I5032 Chronic diastolic (congestive) heart failure: Secondary | ICD-10-CM

## 2021-11-16 DIAGNOSIS — I714 Abdominal aortic aneurysm, without rupture, unspecified: Secondary | ICD-10-CM | POA: Diagnosis not present

## 2021-11-16 DIAGNOSIS — N400 Enlarged prostate without lower urinary tract symptoms: Secondary | ICD-10-CM | POA: Diagnosis not present

## 2021-11-16 DIAGNOSIS — N1831 Chronic kidney disease, stage 3a: Secondary | ICD-10-CM | POA: Diagnosis present

## 2021-11-16 DIAGNOSIS — R296 Repeated falls: Principal | ICD-10-CM | POA: Diagnosis present

## 2021-11-16 DIAGNOSIS — Z8249 Family history of ischemic heart disease and other diseases of the circulatory system: Secondary | ICD-10-CM

## 2021-11-16 DIAGNOSIS — N189 Chronic kidney disease, unspecified: Secondary | ICD-10-CM

## 2021-11-16 DIAGNOSIS — R748 Abnormal levels of other serum enzymes: Secondary | ICD-10-CM | POA: Diagnosis not present

## 2021-11-16 DIAGNOSIS — J449 Chronic obstructive pulmonary disease, unspecified: Secondary | ICD-10-CM | POA: Diagnosis present

## 2021-11-16 DIAGNOSIS — Z6822 Body mass index (BMI) 22.0-22.9, adult: Secondary | ICD-10-CM

## 2021-11-16 DIAGNOSIS — D631 Anemia in chronic kidney disease: Secondary | ICD-10-CM

## 2021-11-16 DIAGNOSIS — M6282 Rhabdomyolysis: Secondary | ICD-10-CM | POA: Diagnosis present

## 2021-11-16 DIAGNOSIS — Z85820 Personal history of malignant melanoma of skin: Secondary | ICD-10-CM

## 2021-11-16 DIAGNOSIS — R112 Nausea with vomiting, unspecified: Secondary | ICD-10-CM | POA: Diagnosis present

## 2021-11-16 DIAGNOSIS — F32A Depression, unspecified: Secondary | ICD-10-CM | POA: Diagnosis present

## 2021-11-16 DIAGNOSIS — I2510 Atherosclerotic heart disease of native coronary artery without angina pectoris: Secondary | ICD-10-CM | POA: Diagnosis present

## 2021-11-16 DIAGNOSIS — N179 Acute kidney failure, unspecified: Secondary | ICD-10-CM | POA: Diagnosis not present

## 2021-11-16 DIAGNOSIS — Z7902 Long term (current) use of antithrombotics/antiplatelets: Secondary | ICD-10-CM

## 2021-11-16 DIAGNOSIS — R55 Syncope and collapse: Secondary | ICD-10-CM | POA: Diagnosis present

## 2021-11-16 DIAGNOSIS — G2401 Drug induced subacute dyskinesia: Secondary | ICD-10-CM

## 2021-11-16 DIAGNOSIS — R5381 Other malaise: Secondary | ICD-10-CM

## 2021-11-16 DIAGNOSIS — K219 Gastro-esophageal reflux disease without esophagitis: Secondary | ICD-10-CM | POA: Diagnosis present

## 2021-11-16 DIAGNOSIS — E860 Dehydration: Secondary | ICD-10-CM | POA: Diagnosis present

## 2021-11-16 DIAGNOSIS — F4310 Post-traumatic stress disorder, unspecified: Secondary | ICD-10-CM | POA: Diagnosis present

## 2021-11-16 DIAGNOSIS — Z87891 Personal history of nicotine dependence: Secondary | ICD-10-CM

## 2021-11-16 DIAGNOSIS — G2581 Restless legs syndrome: Secondary | ICD-10-CM

## 2021-11-16 DIAGNOSIS — I951 Orthostatic hypotension: Secondary | ICD-10-CM

## 2021-11-16 DIAGNOSIS — E785 Hyperlipidemia, unspecified: Secondary | ICD-10-CM | POA: Diagnosis present

## 2021-11-16 DIAGNOSIS — I130 Hypertensive heart and chronic kidney disease with heart failure and stage 1 through stage 4 chronic kidney disease, or unspecified chronic kidney disease: Secondary | ICD-10-CM | POA: Diagnosis present

## 2021-11-16 DIAGNOSIS — D72829 Elevated white blood cell count, unspecified: Secondary | ICD-10-CM | POA: Diagnosis present

## 2021-11-16 DIAGNOSIS — R9431 Abnormal electrocardiogram [ECG] [EKG]: Secondary | ICD-10-CM | POA: Diagnosis present

## 2021-11-16 DIAGNOSIS — Z8673 Personal history of transient ischemic attack (TIA), and cerebral infarction without residual deficits: Secondary | ICD-10-CM

## 2021-11-16 DIAGNOSIS — Z20822 Contact with and (suspected) exposure to covid-19: Secondary | ICD-10-CM | POA: Diagnosis present

## 2021-11-16 DIAGNOSIS — Z825 Family history of asthma and other chronic lower respiratory diseases: Secondary | ICD-10-CM

## 2021-11-16 LAB — URINALYSIS, ROUTINE W REFLEX MICROSCOPIC
Bacteria, UA: NONE SEEN
Bilirubin Urine: NEGATIVE
Glucose, UA: NEGATIVE mg/dL
Ketones, ur: 5 mg/dL — AB
Leukocytes,Ua: NEGATIVE
Nitrite: NEGATIVE
Protein, ur: 100 mg/dL — AB
Specific Gravity, Urine: 1.025 (ref 1.005–1.030)
pH: 5 (ref 5.0–8.0)

## 2021-11-16 LAB — COMPREHENSIVE METABOLIC PANEL
ALT: 19 U/L (ref 0–44)
AST: 32 U/L (ref 15–41)
Albumin: 4.1 g/dL (ref 3.5–5.0)
Alkaline Phosphatase: 60 U/L (ref 38–126)
Anion gap: 10 (ref 5–15)
BUN: 50 mg/dL — ABNORMAL HIGH (ref 8–23)
CO2: 20 mmol/L — ABNORMAL LOW (ref 22–32)
Calcium: 8.9 mg/dL (ref 8.9–10.3)
Chloride: 104 mmol/L (ref 98–111)
Creatinine, Ser: 2.32 mg/dL — ABNORMAL HIGH (ref 0.61–1.24)
GFR, Estimated: 28 mL/min — ABNORMAL LOW (ref >60)
Glucose, Bld: 137 mg/dL — ABNORMAL HIGH (ref 70–99)
Potassium: 4.8 mmol/L (ref 3.5–5.1)
Sodium: 134 mmol/L — ABNORMAL LOW (ref 135–145)
Total Bilirubin: 0.5 mg/dL (ref 0.3–1.2)
Total Protein: 6.3 g/dL — ABNORMAL LOW (ref 6.5–8.1)

## 2021-11-16 LAB — CBC WITH DIFFERENTIAL/PLATELET
Abs Immature Granulocytes: 0.03 10*3/uL (ref 0.00–0.07)
Basophils Absolute: 0 10*3/uL (ref 0.0–0.1)
Basophils Relative: 0 %
Eosinophils Absolute: 0 10*3/uL (ref 0.0–0.5)
Eosinophils Relative: 0 %
HCT: 33.8 % — ABNORMAL LOW (ref 39.0–52.0)
Hemoglobin: 11.1 g/dL — ABNORMAL LOW (ref 13.0–17.0)
Immature Granulocytes: 0 %
Lymphocytes Relative: 8 %
Lymphs Abs: 0.9 10*3/uL (ref 0.7–4.0)
MCH: 30.2 pg (ref 26.0–34.0)
MCHC: 32.8 g/dL (ref 30.0–36.0)
MCV: 91.8 fL (ref 80.0–100.0)
Monocytes Absolute: 1 10*3/uL (ref 0.1–1.0)
Monocytes Relative: 9 %
Neutro Abs: 8.8 10*3/uL — ABNORMAL HIGH (ref 1.7–7.7)
Neutrophils Relative %: 83 %
Platelets: 183 10*3/uL (ref 150–400)
RBC: 3.68 MIL/uL — ABNORMAL LOW (ref 4.22–5.81)
RDW: 13.4 % (ref 11.5–15.5)
WBC: 10.7 10*3/uL — ABNORMAL HIGH (ref 4.0–10.5)
nRBC: 0 % (ref 0.0–0.2)

## 2021-11-16 LAB — RESP PANEL BY RT-PCR (FLU A&B, COVID) ARPGX2
Influenza A by PCR: NEGATIVE
Influenza B by PCR: NEGATIVE
SARS Coronavirus 2 by RT PCR: NEGATIVE

## 2021-11-16 LAB — CK: Total CK: 1832 U/L — ABNORMAL HIGH (ref 49–397)

## 2021-11-16 LAB — MAGNESIUM: Magnesium: 2.3 mg/dL (ref 1.7–2.4)

## 2021-11-16 MED ORDER — SODIUM CHLORIDE 0.9 % IV BOLUS
500.0000 mL | Freq: Once | INTRAVENOUS | Status: AC
  Administered 2021-11-16: 500 mL via INTRAVENOUS

## 2021-11-16 MED ORDER — CLOPIDOGREL BISULFATE 75 MG PO TABS: 75.0000 mg | ORAL_TABLET | Freq: Every day | ORAL | Status: DC

## 2021-11-16 MED ORDER — LACTATED RINGERS IV SOLN: INTRAVENOUS | Status: DC

## 2021-11-16 MED ORDER — ACETAMINOPHEN 650 MG RE SUPP: 650.0000 mg | Freq: Four times a day (QID) | RECTAL | Status: DC | PRN

## 2021-11-16 MED ORDER — PANTOPRAZOLE SODIUM 40 MG PO TBEC
40.0000 mg | DELAYED_RELEASE_TABLET | Freq: Every day | ORAL | Status: DC
  Administered 2021-11-17 – 2021-11-20 (×4): 40 mg via ORAL
  Filled 2021-11-16 (×4): qty 1

## 2021-11-16 MED ORDER — FINASTERIDE 5 MG PO TABS: 5.0000 mg | ORAL_TABLET | Freq: Every day | ORAL | Status: DC

## 2021-11-16 MED ORDER — LORAZEPAM 2 MG/ML IJ SOLN: 0.5000 mg | Freq: Four times a day (QID) | INTRAMUSCULAR | Status: DC | PRN

## 2021-11-16 MED ORDER — ACETAMINOPHEN 325 MG PO TABS: 650.0000 mg | ORAL_TABLET | Freq: Four times a day (QID) | ORAL | Status: DC | PRN

## 2021-11-16 NOTE — ED Provider Notes (Signed)
[facility] DEPT Provider Note   CSN: [id number] Arrival date & time: 11/16/21  1524     History  Chief Complaint  Patient presents with   [name] is a 76 y.o. male.  [name] is a 76 y/o M with a hx of CAD s/p CABG, HTN, TIA, GERD, PTSD, and cognitive impairment who presents to the ED via EMS after multiple unwitnessed falls occurring over the weekend. Pt reports he ate [facility] on Friday evening and then shortly afterwards developed non-bloody vomiting and diarrhea. He states the NVD persisted into today and has had decreased PO intake since then. On Saturday (2/11) the pt states he was walking to his bathroom and then next thing he remembers is waking up on the ground. Pt reports multiple unwitnessed falls since then.  Patient reports feeling lightheaded and then thinks he is passing out causing him to fall.  Of note, pt is on Plavix 75mg  daily. He denies any recent fever, chills, headaches, chest pain, or SOB. Denies any prodromal sxs prior to the falls. Pt reports he called EMS early this morning after a fall but refused transport. His wife, whom he does not live with, called him yesterday night and this morning but went to his house this morning because he was not answering the phone. She found the pt down for an unknown amount of time, but he was alert and oriented per her report. She denies any incontinence or stool on the patient. She reports helping the pt to his feet and going out to her car to transport him to the ED but when she returned the pt had fallen again. Wife states the pt sometimes has issues with his balance but it has been worse since she saw him today. She reports b/l tremors and lip smacking/pill rolling behaviors at baseline that are chronic and unchanged. Pt denies any recent CP, SOB, HA, fever, chills, urinary sxs, or any other medical concerns.   The history is provided by the patient, the spouse and medical  records.      Home Medications Prior to Admission medications   Medication Sig Start Date End Date Taking? Authorizing Provider  atorvastatin (LIPITOR) 40 MG tablet Take 40 mg by mouth daily. Reported on 10/27/2015    [provider]  clopidogrel (PLAVIX) 75 MG tablet Take 1 tablet (75 mg total) by mouth daily. 06/28/13   [name], MD  cyanocobalamin (,VITAMIN B-12,) 1000 MCG/ML injection every 30 (thirty) days. 05/15/13   [provider]  finasteride (PROSCAR) 5 MG tablet Take 5 mg by mouth daily.  03/13/13   [provider]  FLUoxetine (PROZAC) 20 MG capsule  08/24/15   [provider]  mirtazapine (REMERON) 30 MG tablet Take 30 mg by mouth at bedtime.  04/19/13   [provider]  pantoprazole (PROTONIX) 40 MG tablet Take 1 tablet (40 mg total) by mouth daily before breakfast. 03/28/14   Gatha Mayer, MD  rivastigmine (EXELON) 9.5 mg/24hr APPLY 1 PATCH EXTERNALLY TO THE SKIN EVERY DAY AS DIRECTED 10/27/15   Ward Givens, NP  tamsulosin (FLOMAX) 0.4 MG CAPS capsule Take as directed 06/03/14   [provider]  metoprolol (LOPRESSOR) 50 MG tablet Take 0.5 tablets (25 mg total) by mouth 2 (two) times daily. 03/05/11 12/09/11  [name], MD      Allergies    Patient has no known allergies.    Review of Systems   Review  of Systems  Constitutional:  Negative for chills and fever.  HENT: Negative.    Respiratory:  Negative for cough and shortness of breath.   Cardiovascular:  Negative for chest pain.  Gastrointestinal:  Positive for diarrhea, nausea and vomiting. Negative for abdominal pain.  Genitourinary:  Negative for dysuria and frequency.  Musculoskeletal:  Negative for arthralgias and myalgias.  Skin:  Negative for color change and rash.  Neurological:  Positive for syncope, weakness (Generalized) and light-headedness. Negative for numbness and headaches.  All other systems reviewed and are negative.  Physical  Exam Updated Vital Signs BP 134/74 (BP Location: Right Arm)    Pulse 80    Temp 98.1 F (36.7 C) (Oral)    Resp 17    SpO2 100%  Physical Exam Vitals and nursing note reviewed.  Constitutional:      General: He is not in acute distress.    Appearance: Normal appearance. He is well-developed. He is ill-appearing. He is not diaphoretic.     Comments: Alert, chronically ill-appearing  HENT:     Head: Normocephalic and atraumatic.     Mouth/Throat:     Mouth: Mucous membranes are dry.     Pharynx: Oropharynx is clear.  Eyes:     General:        Right eye: No discharge.        Left eye: No discharge.  Cardiovascular:     Rate and Rhythm: Normal rate and regular rhythm.     Pulses: Normal pulses.     Heart sounds: Normal heart sounds. No murmur heard.   No friction rub. No gallop.  Pulmonary:     Effort: Pulmonary effort is normal. No respiratory distress.     Breath sounds: Normal breath sounds. No wheezing or rales.     Comments: Respirations equal and unlabored, patient able to speak in full sentences, lungs clear to auscultation bilaterally  Chest:     Chest wall: No tenderness.  Abdominal:     General: Bowel sounds are normal. There is no distension.     Palpations: Abdomen is soft. There is no mass.     Tenderness: There is no abdominal tenderness. There is no guarding.     Comments: Abdomen soft, nondistended, nontender to palpation in all quadrants without guarding or peritoneal signs  Musculoskeletal:        General: Tenderness present. No deformity.     Cervical back: Neck supple.     Right lower leg: No edema.     Left lower leg: No edema.     Comments: Mild diffuse spinal tenderness without palpable step-off or deformity, no focal tenderness over the joints or extremities, all joints supple and easily movable, all compartments soft  Skin:    General: Skin is warm and dry.     Capillary Refill: Capillary refill takes less than 2 seconds.  Neurological:     Mental  Status: He is alert and oriented to person, place, and time.     Coordination: Coordination normal.     Comments: Speech is clear, able to follow commands CN III-XII intact Normal strength in upper and lower extremities bilaterally including dorsiflexion and plantar flexion, strong and equal grip strength Sensation normal to light and sharp touch Moves extremities without ataxia, chronic tremor of the extremities and persistent lipsmacking movements  Psychiatric:        Mood and Affect: Mood normal.        Behavior: Behavior normal.    ED Results /  Procedures / Treatments   Labs (all labs ordered are listed, but only abnormal results are displayed) Labs Reviewed  CBC WITH DIFFERENTIAL/PLATELET - Abnormal; Notable for the following components:      Result Value   WBC 10.7 (*)    RBC 3.68 (*)    Hemoglobin 11.1 (*)    HCT 33.8 (*)    Neutro Abs 8.8 (*)    All other components within normal limits  COMPREHENSIVE METABOLIC PANEL - Abnormal; Notable for the following components:   Sodium 134 (*)    CO2 20 (*)    Glucose, Bld 137 (*)    BUN 50 (*)    Creatinine, Ser 2.32 (*)    Total Protein 6.3 (*)    GFR, Estimated 28 (*)    All other components within normal limits  URINALYSIS, ROUTINE W REFLEX MICROSCOPIC - Abnormal; Notable for the following components:   Hgb urine dipstick SMALL (*)    Ketones, ur 5 (*)    Protein, ur 100 (*)    All other components within normal limits  CK - Abnormal; Notable for the following components:   Total CK 1,832 (*)    All other components within normal limits  RESP PANEL BY RT-PCR (FLU A&B, COVID) ARPGX2  MAGNESIUM  PHOSPHORUS  MAGNESIUM  COMPREHENSIVE METABOLIC PANEL  CBC WITH DIFFERENTIAL/PLATELET  CK  SODIUM, URINE, RANDOM  CREATININE, URINE, RANDOM    EKG None  Radiology CT ABDOMEN PELVIS WO CONTRAST  Result Date: 11/16/2021 CLINICAL DATA:  Fall EXAM: CT ABDOMEN AND PELVIS WITHOUT CONTRAST TECHNIQUE: Multidetector CT imaging  of the abdomen and pelvis was performed following the standard protocol without IV contrast. RADIATION DOSE REDUCTION: This exam was performed according to the departmental dose-optimization program which includes automated exposure control, adjustment of the mA and/or kV according to patient size and/or use of iterative reconstruction technique. COMPARISON:  CT abdomen pelvis 02/14/2012 FINDINGS: Lower chest: No acute abnormality. Coronary calcification. Tiny hiatal hernia. Liver: Not enlarged. No focal lesion. No laceration or subcapsular hematoma. Biliary System: Status post cholecystectomy. No biliary ductal dilatation. Pancreas: Normal pancreatic contour. No main pancreatic duct dilatation. Spleen: Not enlarged. No focal lesion. No laceration, subcapsular hematoma, or vascular injury. Adrenal Glands: No nodularity bilaterally. Kidneys: Bilateral kidneys enhance symmetrically. There is a 5.7 x 5.4 cm fluid density lesion within the right kidney that likely represents a simple renal cyst. No hydronephrosis. No contusion, laceration, or subcapsular hematoma. No injury to the vascular structures or collecting systems. No hydroureter. The urinary bladder is unremarkable. Bowel: No small or large bowel wall thickening or dilatation. Few scattered colonic diverticula. The appendix is unremarkable. Mesentery, Omentum, and Peritoneum: No simple free fluid ascites. No pneumoperitoneum. No hemoperitoneum. No mesenteric hematoma identified. No organized fluid collection. Pelvic Organs: Normal. Lymph Nodes: No abdominal, pelvic, inguinal lymphadenopathy. Vasculature: Severe atherosclerotic plaque. Interval increase in size of an infrarenal abdominal aorta aneurysm status post stent graft repair extending from just inferior to superior mesenteric artery down to bilateral common iliac arteries. The aneurysm measures up to 5.8 x 5.3 cm (from 3.8 x 3.5 cm in 2013 pre stenting) on axial imaging. The aneurysm measures  approximately 8 cm in the craniocaudal dimension. No active contrast extravasation or pseudoaneurysm. Musculoskeletal: No significant soft tissue hematoma. No acute pelvic fracture. Please see separately dictated CT thoracolumbar spine 11/16/21. IMPRESSION: 1. No acute traumatic injury to the abdomen or pelvis with limited evaluation on this noncontrast study. 2. Please see separately dictated CT thoracolumbar spine 11/16/21.  3. Interval increase in size of a stented infrarenal abdominal aorta aneurysm measuring up to 5.8 x 5.3 cm. Recommend referral to a vascular specialist. This recommendation follows ACR consensus guidelines: White Paper of the ACR Incidental Findings Committee II on Vascular Findings. J Am Coll Radiol 2013; 10:789-794. 4. Tiny hiatal hernia. 5. Colonic diverticulosis with no acute diverticulitis. 6. Aortic aneurysm NOS (ICD10-I71.9). Aortic Atherosclerosis (ICD10-I70.0). Electronically Signed   By: Iven Finn M.D.   On: 11/16/2021 18:48   CT Head Wo Contrast  Result Date: 11/16/2021 CLINICAL DATA:  Head trauma, minor (Age >= 65y); Neck trauma (Age >= 65y). Fall EXAM: CT HEAD WITHOUT CONTRAST CT CERVICAL SPINE WITHOUT CONTRAST TECHNIQUE: Multidetector CT imaging of the head and cervical spine was performed following the standard protocol without intravenous contrast. Multiplanar CT image reconstructions of the cervical spine were also generated. RADIATION DOSE REDUCTION: This exam was performed according to the departmental dose-optimization program which includes automated exposure control, adjustment of the mA and/or kV according to patient size and/or use of iterative reconstruction technique. COMPARISON:  None. FINDINGS: CT HEAD FINDINGS BRAIN: BRAIN Limited evaluation due to artifact. Patchy and confluent areas of decreased attenuation are noted throughout the deep and periventricular white matter of the cerebral hemispheres bilaterally, compatible with chronic microvascular ischemic  disease. No evidence of large-territorial acute infarction. No parenchymal hemorrhage. No mass lesion. No extra-axial collection. No mass effect or midline shift. No hydrocephalus. Basilar cisterns are patent. Vascular: No hyperdense vessel. Atherosclerotic calcifications are present within the cavernous internal carotid arteries. Skull: No acute fracture or focal lesion. Sinuses/Orbits: Paranasal sinuses and mastoid air cells are clear. The orbits are unremarkable. Other: None. CT CERVICAL SPINE FINDINGS Alignment: Normal. Skull base and vertebrae: Multilevel severe degenerative changes of the spine. No associated severe osseous neural foraminal or central canal stenosis. No acute fracture. No aggressive appearing focal osseous lesion or focal pathologic process. Soft tissues and spinal canal: No prevertebral fluid or swelling. No visible canal hematoma. Upper chest: Unremarkable. Other: Atherosclerotic plaque of the carotid arteries within the neck. IMPRESSION: 1. No acute intracranial abnormality with limited evaluation due to artifact. 2. No acute displaced fracture or traumatic listhesis of the cervical spine. Electronically Signed   By: Iven Finn M.D.   On: 11/16/2021 17:57   CT Cervical Spine Wo Contrast  Result Date: 11/16/2021 CLINICAL DATA:  Head trauma, minor (Age >= 65y); Neck trauma (Age >= 65y). Fall EXAM: CT HEAD WITHOUT CONTRAST CT CERVICAL SPINE WITHOUT CONTRAST TECHNIQUE: Multidetector CT imaging of the head and cervical spine was performed following the standard protocol without intravenous contrast. Multiplanar CT image reconstructions of the cervical spine were also generated. RADIATION DOSE REDUCTION: This exam was performed according to the departmental dose-optimization program which includes automated exposure control, adjustment of the mA and/or kV according to patient size and/or use of iterative reconstruction technique. COMPARISON:  None. FINDINGS: CT HEAD FINDINGS BRAIN: BRAIN  Limited evaluation due to artifact. Patchy and confluent areas of decreased attenuation are noted throughout the deep and periventricular white matter of the cerebral hemispheres bilaterally, compatible with chronic microvascular ischemic disease. No evidence of large-territorial acute infarction. No parenchymal hemorrhage. No mass lesion. No extra-axial collection. No mass effect or midline shift. No hydrocephalus. Basilar cisterns are patent. Vascular: No hyperdense vessel. Atherosclerotic calcifications are present within the cavernous internal carotid arteries. Skull: No acute fracture or focal lesion. Sinuses/Orbits: Paranasal sinuses and mastoid air cells are clear. The orbits are unremarkable. Other: None. CT CERVICAL SPINE FINDINGS  Alignment: Normal. Skull base and vertebrae: Multilevel severe degenerative changes of the spine. No associated severe osseous neural foraminal or central canal stenosis. No acute fracture. No aggressive appearing focal osseous lesion or focal pathologic process. Soft tissues and spinal canal: No prevertebral fluid or swelling. No visible canal hematoma. Upper chest: Unremarkable. Other: Atherosclerotic plaque of the carotid arteries within the neck. IMPRESSION: 1. No acute intracranial abnormality with limited evaluation due to artifact. 2. No acute displaced fracture or traumatic listhesis of the cervical spine. Electronically Signed   By: Iven Finn M.D.   On: 11/16/2021 17:57   CT Thoracic Spine Wo Contrast  Result Date: 11/16/2021 CLINICAL DATA:  Back trauma, no prior imaging (Age >= 16y). Status post fall EXAM: CT THORACIC AND LUMBAR SPINE WITHOUT CONTRAST TECHNIQUE: Multidetector CT imaging of the thoracic and lumbar spine was performed without contrast. Multiplanar CT image reconstructions were also generated. RADIATION DOSE REDUCTION: This exam was performed according to the departmental dose-optimization program which includes automated exposure control,  adjustment of the mA and/or kV according to patient size and/or use of iterative reconstruction technique. COMPARISON:  None. FINDINGS: CT THORACIC SPINE FINDINGS Alignment: Normal. Vertebrae: Multilevel contiguous moderate osteophyte formation. No acute fracture or focal pathologic process. Paraspinal and other soft tissues: Negative. Disc levels: Maintained. CT LUMBAR SPINE FINDINGS Segmentation: 5 lumbar type vertebrae. Alignment: Normal. Vertebrae: Multilevel moderate osteophyte formation. No acute fracture or focal pathologic process. Paraspinal and other soft tissues: Negative. Disc levels: Maintained. Visualized portions of the thorax: Right lower lobe subsegmental atelectasis. Atherosclerotic plaque of the aorta. Four-vessel coronary calcification. Sternotomy wires. Visualized portions of the abdomen and pelvis: Please see separately dictated CT abdomen pelvis 11/16/2021. IMPRESSION: CT THORACIC SPINE IMPRESSION No acute displaced fracture or traumatic listhesis of the thoracic spine. CT LUMBAR SPINE IMPRESSION No acute displaced fracture or traumatic listhesis of the lumbar spine. Please see separately dictated CT abdomen pelvis 11/16/2021. Aortic aneurysm NOS (ICD10-I71.9). Aortic Atherosclerosis (ICD10-I70.0). Electronically Signed   By: Iven Finn M.D.   On: 11/16/2021 18:51   CT L-SPINE NO CHARGE  Result Date: 11/16/2021 CLINICAL DATA:  Back trauma, no prior imaging (Age >= 16y). Status post fall EXAM: CT THORACIC AND LUMBAR SPINE WITHOUT CONTRAST TECHNIQUE: Multidetector CT imaging of the thoracic and lumbar spine was performed without contrast. Multiplanar CT image reconstructions were also generated. RADIATION DOSE REDUCTION: This exam was performed according to the departmental dose-optimization program which includes automated exposure control, adjustment of the mA and/or kV according to patient size and/or use of iterative reconstruction technique. COMPARISON:  None. FINDINGS: CT THORACIC  SPINE FINDINGS Alignment: Normal. Vertebrae: Multilevel contiguous moderate osteophyte formation. No acute fracture or focal pathologic process. Paraspinal and other soft tissues: Negative. Disc levels: Maintained. CT LUMBAR SPINE FINDINGS Segmentation: 5 lumbar type vertebrae. Alignment: Normal. Vertebrae: Multilevel moderate osteophyte formation. No acute fracture or focal pathologic process. Paraspinal and other soft tissues: Negative. Disc levels: Maintained. Visualized portions of the thorax: Right lower lobe subsegmental atelectasis. Atherosclerotic plaque of the aorta. Four-vessel coronary calcification. Sternotomy wires. Visualized portions of the abdomen and pelvis: Please see separately dictated CT abdomen pelvis 11/16/2021. IMPRESSION: CT THORACIC SPINE IMPRESSION No acute displaced fracture or traumatic listhesis of the thoracic spine. CT LUMBAR SPINE IMPRESSION No acute displaced fracture or traumatic listhesis of the lumbar spine. Please see separately dictated CT abdomen pelvis 11/16/2021. Aortic aneurysm NOS (ICD10-I71.9). Aortic Atherosclerosis (ICD10-I70.0). Electronically Signed   By: Iven Finn M.D.   On: 11/16/2021 18:51   DG Chest  Port 1 View  Result Date: 11/16/2021 CLINICAL DATA:  Provided history: Weakness, syncope. Additional history provided: Fall, found on floor by caregiver. EXAM: PORTABLE CHEST 1 VIEW COMPARISON:  Prior chest radiographs 12/05/2012 and earlier. FINDINGS: Please note a portion of the left lateral costophrenic angle is excluded from the field of view. Prior median sternotomy/CABG. Heart size within normal limits. No appreciable airspace consolidation or pulmonary edema. No evidence of pleural effusion or pneumothorax. No acute bony abnormality identified. IMPRESSION: A portion of the left lateral costophrenic angle is excluded from the field of view. No evidence of acute cardiopulmonary abnormality. Electronically Signed   By: Kellie Simmering D.O.   On: 11/16/2021  16:46    Procedures Procedures    Medications Ordered in ED Medications  acetaminophen (TYLENOL) tablet 650 mg (has no administration in time range)    Or  acetaminophen (TYLENOL) suppository 650 mg (has no administration in time range)  lactated ringers infusion ( Intravenous New Bag/Given 11/16/21 2047)  LORazepam (ATIVAN) injection 0.5 mg (has no administration in time range)  clopidogrel (PLAVIX) tablet 75 mg (has no administration in time range)  finasteride (PROSCAR) tablet 5 mg (has no administration in time range)  pantoprazole (PROTONIX) EC tablet 40 mg (has no administration in time range)  sodium chloride 0.9 % bolus 500 mL (0 mLs Intravenous Stopped 11/16/21 1738)  sodium chloride 0.9 % bolus 500 mL (0 mLs Intravenous Stopped 11/16/21 1919)    ED Course/ Medical Decision Making/ A&P                           This patient presents to the ED for concern of multiple falls, syncope, vomiting, diarrhea, this involves an extensive number of treatment options, and is a complaint that carries with it a high risk of complications and morbidity.  The differential diagnosis includes dehydration, AKI, infection such as pneumonia, UTI or diarrheal illness, head injury, head bleed, stroke.   Co morbidities that complicate the patient evaluation  Diabetes, hypertension, CVA, AAA   Additional history obtained:  Additional history obtained from wife at bedside External records from outside source obtained and reviewed including outpatient follow-up visits and prior ED visits   Lab Tests:  I Ordered, and personally interpreted labs.  The pertinent results include: High with creatinine of 2.23 and BUN of 50, no other significant electrolyte derangements, minimal leukocytosis and stable hemoglobin, CK elevated at 1832, likely due to downtime after fall.  No evidence of UTI, COVID/flu negative   Imaging Studies ordered:  I ordered imaging studies including chest x-ray, CT head, CT  cervical, thoracic and lumbar spine, CT abdomen pelvis I independently visualized and interpreted imaging which showed no evidence of pneumonia on chest x-ray, CTs of the head and spine without acute traumatic injury from falls.  CT abdomen pelvis with no acute intra-abdominal abnormalities.  Does show expansion of previously stented AAA. I agree with the radiologist interpretation   Cardiac Monitoring:  The patient was maintained on a cardiac monitor.  I personally viewed and interpreted the cardiac monitored which showed an underlying rhythm of: Sinus rhythm   Medicines ordered and prescription drug management:  I ordered medication including IV fluid bolus for AKI and dehydration Reevaluation of the patient after these medicines showed that the patient improved I have reviewed the patients home medicines and have made adjustments as needed    Consultations Obtained:  I requested consultation with the hospitalist for admission,  and discussed  lab and imaging findings as well as pertinent plan -Dr. Velia Meyer will see and admit the patient  Problem List / ED Course:  Multiple falls likely due to syncope in the setting of vomiting and diarrhea.  No signs of traumatic injury and abdominal scan is negative.  Patient with AKI.  IV fluids ordered.  Will require admission    Social Determinants of Health:  Patient lives alone   Dispostion:  After consideration of the diagnostic results and the patients response to treatment, I feel that the patent would benefit from admission.          Final Clinical Impression(s) / ED Diagnoses Final diagnoses:  Multiple falls  Syncope, unspecified syncope type  AKI (acute kidney injury) [facility])    Rx / DC Orders ED Discharge Orders     None         Janet Berlin 11/16/21 2328    Lacretia Leigh, MD 11/17/21 2200

## 2021-11-16 NOTE — ED Triage Notes (Signed)
Patient BIB EMS from home c/o fall. Per report pt fell this afternoon and was found by caregiver in the floor. Per report patient denies LOC. Patient on blood thinners. Pt a/ox4.

## 2021-11-16 NOTE — H&P (Addendum)
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  Suspect that this is prerenal in nature in the context of dehydration as a result of recent increase in GI losses in the form of nausea/vomiting as well as consequential decline in oral intake over that timeframe.  As of nausea the patient has a history of chronic diarrhea, although, per history, does not appear that this is a exacerbation relative to his baseline volume and frequency thereof.  Potential additional contribution from early rhabdomyolysis after being found down on the floor of his home overnight, with  presenting CPK elevated at approximately 1800, as above.  Urinalysis with microscopy ordered, with results currently pending.  In the setting of a document history of chronic diastolic heart failure, will be slightly more conservative with rate of ensuing IV fluids, as quantified below.  Plan: Lactated Ringer's at 125 cc/h.  Monitor strict I's and O's and O's.  Tempt avoid nephrotoxic agents.  Follow-up result urinalysis with microscopy, including attention to interval urinary gas.  Add on random insulin as well as regular creatinine.  Repeat CPK level in the morning.       #)  Elevated CPK level: After prolonged downtime at home, as above, mildly elevated CPK of 1832.  Potentially representing early rhabdomyolysis, although urinalysis to assess for evidence of myoglobinuria is currently pending.  No evidence of generalized myalgias or generalized weakness to clinically correlate at this time, may be potentially related to his presenting AKI on CKD 3A, as above.  Of note, he is also on high intensity atorvastatin as an outpatient, although this does not appear to be a new medication for him.  Plan: IV fluids, as above.  Monitor strict I's and O's and O's.  Repeat CPK level in the morning.  Check serum phosphorus level repeat CMP in the morning.  Every 4 hours neurochecks.  Follow-up result urinalysis with microscopy.  Hold home atorvastatin for now.        #) Syncope: 1 episode of syncope that occurred when the patient was moving from a seated/supine to standing position and associated with prodrome, suggestive of potential orthostatic hypotension in the setting of dehydration given report of recent N/V, diminished oral intake and exacerbated by home pharmacologic factors serving to diminish compensatory peripheral vasoconstrictive response in the setting of outpatient tamsulosin.Will check orthostatic vital signs, but with the caveat that the patient has already received IVF's in the ED,  potentially altering the results of this evaluation.    Not associated with any overt acute focal neurologic deficits. Clinically, acute ischemic stroke versus seizures appear less likely at this time. Presentation appears less consistent with ACS at this time, presenting EKG showing no evidence of acute ischemic changes, and the absence of any associated CP.    In setting of associated prodrome, the possibility of ventricular arrhythmia appears to be less likely at this time, although will closely monitor on telemetry for any e/o potential contributory arrhythmia, while also assessing serum Mg level, particularly in the setting of evidence of QTc prolongation.      Plan: I have placed a nursing communication order requesting that orthostatic vital signs x 1 set be checked and documented.  IV fluids, as above. Monitor on telemetry. Hold home tamsulosin . Monitor strict I's and O's.  Add-on serum Mg level. Check CMP, CBC, serum Mg level in the AM. Fall precautions ordered.  Every 4 hours neurochecks x3 occurrences.       #) Leukocytosis: Mildly elevated white cell count of 10,700, which is suspected to be basis.  Changes as a consequence of presenting fall with prolonged downtime.,  As well as hemoconcentrated impact of presenting dehydration.  No evidence of infection at this time.  Additionally, no additional SIRS criteria met at this time.  Overall, criteria for sepsis not met at this time.  Of note, chest x-ray shows no evidence of acute cardiopulmonary process.  Urinalysis as well as COVID-19/influenza PCR results are currently pending.  Plan: Follow-up result urinalysis as well as COVID-19/influenza PCR.  IV fluids, as above.  Repeat CBC with differential in the morning.          #) QTc prolongation: Presenting EKG demonstrates QTc of 502 ms. outpatient medications that may be contributing to QTc prolongation: Prozac.  In terms of decision-making regarding prn antiemetic in the  setting of his recent nausea/vomiting, will pursue prn Ativan given diminished QTc prolongation ramifications relative to some of the alternative antiemetics.   Plan: Monitor on telemetry.  Add-on serum magnesium level.  Repeat EKG in the morning to monitor interval degree of QTc prolongation.  Hold outpatient Prozac.  Prn IV Ativan for nausea.         #)  Abdominal aortic aneurysm: Documented history of such status post any procedure, with CT abdomen/pelvis performed today for empiric trauma work-up after presenting ground-level fall demonstrating increase in size without any evidence to suggest dissection or pseudoaneurysm.  No clinical evidence to suggest dissection at this time, the patient denies any recent acute abdominal discomfort.  He reportedly follows with [facility] CT surgery.  In the setting of interval increase in size of his known AAA without evidence of dissection either radiographically or clinically, will recommend follow-up with his CT surgeon.   Plan: Follow-up with CT surgery, as above.            #) Chronic diastolic heart failure: documented history of such, with most recent echocardiogram performed in March 2013 notable for left ventricular cavity size normal, normal left ventricular wall thickness, LVEF 55 to 60%, no focal wall motion abnormalities, while demonstrating evidence of grade 1 diastolic dysfunction. No clinical evidence to suggest acutely decompensated heart failure at this time. home diuretic regimen reportedly consists of the following: None. Will take into account his history of chronic diastolic heart failure with administration of IV fluids for management of presenting AKI with elevated CPK level, as above.    Plan: monitor strict I's & O's and daily weights. Repeat BMP in AM. Check serum mag level.       #) Depression: On Prozac as an outpatient.  In the setting of presenting QTc prolongation, will hold home Prozac for now.  Plan: Hold home  Prozac for now.  Repeat EKG in the morning.  Add on serum magnesium level.         #) Benign Prostatic Hyperplasia:  documented h/o such; on tamsulosin as outpatient.  In the setting of presenting syncope with prodrome suggestive of orthostatic hypotension in the context of dehydration, will hold home tamsulosin for now.  He is also on finasteride as an outpatient.   Plan: monitor strict I's & O's and daily weights. Repeat BMP in AM.  Hold home tamsulosin for now.  Continue outpatient finasteride.          #) Hyperlipidemia: documented h/o such. On high intensity atorvastatin as outpatient.    Plan: In the setting of presenting elevated CPK level, will hold home statin for now.  Repeat CPK level in the morning.       DVT prophylaxis: SCD's   Code Status: Full code Family Communication: Case was discussed with the patient's wife, as above Disposition Plan: Per Rounding Team Consults called: none;  Admission status: Observation; med telemetry    PLEASE NOTE THAT DRAGON DICTATION SOFTWARE WAS USED IN THE CONSTRUCTION OF THIS NOTE.   [place] DO [facility] [place]   11/16/2021, 8:14 PM

## 2021-11-16 NOTE — ED Notes (Signed)
Unable to do ortho vs. Pt unable to sit up and stand up in the side of the bed,

## 2021-11-17 ENCOUNTER — Other Ambulatory Visit: Payer: Self-pay

## 2021-11-17 DIAGNOSIS — I5032 Chronic diastolic (congestive) heart failure: Secondary | ICD-10-CM | POA: Diagnosis present

## 2021-11-17 DIAGNOSIS — I2510 Atherosclerotic heart disease of native coronary artery without angina pectoris: Secondary | ICD-10-CM | POA: Diagnosis present

## 2021-11-17 DIAGNOSIS — E440 Moderate protein-calorie malnutrition: Secondary | ICD-10-CM | POA: Diagnosis present

## 2021-11-17 DIAGNOSIS — F4310 Post-traumatic stress disorder, unspecified: Secondary | ICD-10-CM | POA: Diagnosis present

## 2021-11-17 DIAGNOSIS — F32A Depression, unspecified: Secondary | ICD-10-CM | POA: Diagnosis present

## 2021-11-17 DIAGNOSIS — I130 Hypertensive heart and chronic kidney disease with heart failure and stage 1 through stage 4 chronic kidney disease, or unspecified chronic kidney disease: Secondary | ICD-10-CM | POA: Diagnosis present

## 2021-11-17 DIAGNOSIS — N189 Chronic kidney disease, unspecified: Secondary | ICD-10-CM

## 2021-11-17 DIAGNOSIS — E860 Dehydration: Secondary | ICD-10-CM | POA: Diagnosis present

## 2021-11-17 DIAGNOSIS — Z85820 Personal history of malignant melanoma of skin: Secondary | ICD-10-CM | POA: Diagnosis not present

## 2021-11-17 DIAGNOSIS — J449 Chronic obstructive pulmonary disease, unspecified: Secondary | ICD-10-CM | POA: Diagnosis present

## 2021-11-17 DIAGNOSIS — M6282 Rhabdomyolysis: Secondary | ICD-10-CM | POA: Diagnosis present

## 2021-11-17 DIAGNOSIS — G2581 Restless legs syndrome: Secondary | ICD-10-CM | POA: Diagnosis present

## 2021-11-17 DIAGNOSIS — E872 Acidosis, unspecified: Secondary | ICD-10-CM | POA: Diagnosis present

## 2021-11-17 DIAGNOSIS — Z951 Presence of aortocoronary bypass graft: Secondary | ICD-10-CM | POA: Diagnosis not present

## 2021-11-17 DIAGNOSIS — Z6822 Body mass index (BMI) 22.0-22.9, adult: Secondary | ICD-10-CM | POA: Diagnosis not present

## 2021-11-17 DIAGNOSIS — Z8673 Personal history of transient ischemic attack (TIA), and cerebral infarction without residual deficits: Secondary | ICD-10-CM | POA: Diagnosis not present

## 2021-11-17 DIAGNOSIS — N400 Enlarged prostate without lower urinary tract symptoms: Secondary | ICD-10-CM | POA: Diagnosis present

## 2021-11-17 DIAGNOSIS — Z20822 Contact with and (suspected) exposure to covid-19: Secondary | ICD-10-CM | POA: Diagnosis present

## 2021-11-17 DIAGNOSIS — N1831 Chronic kidney disease, stage 3a: Secondary | ICD-10-CM | POA: Diagnosis present

## 2021-11-17 DIAGNOSIS — N179 Acute kidney failure, unspecified: Secondary | ICD-10-CM | POA: Diagnosis present

## 2021-11-17 DIAGNOSIS — E785 Hyperlipidemia, unspecified: Secondary | ICD-10-CM | POA: Diagnosis present

## 2021-11-17 DIAGNOSIS — D631 Anemia in chronic kidney disease: Secondary | ICD-10-CM

## 2021-11-17 DIAGNOSIS — I951 Orthostatic hypotension: Secondary | ICD-10-CM | POA: Diagnosis present

## 2021-11-17 DIAGNOSIS — G2401 Drug induced subacute dyskinesia: Secondary | ICD-10-CM | POA: Diagnosis present

## 2021-11-17 DIAGNOSIS — K219 Gastro-esophageal reflux disease without esophagitis: Secondary | ICD-10-CM | POA: Diagnosis present

## 2021-11-17 LAB — COMPREHENSIVE METABOLIC PANEL
ALT: 19 U/L (ref 0–44)
AST: 28 U/L (ref 15–41)
Albumin: 3.6 g/dL (ref 3.5–5.0)
Alkaline Phosphatase: 51 U/L (ref 38–126)
Anion gap: 7 (ref 5–15)
BUN: 45 mg/dL — ABNORMAL HIGH (ref 8–23)
CO2: 19 mmol/L — ABNORMAL LOW (ref 22–32)
Calcium: 8.4 mg/dL — ABNORMAL LOW (ref 8.9–10.3)
Chloride: 108 mmol/L (ref 98–111)
Creatinine, Ser: 2.09 mg/dL — ABNORMAL HIGH (ref 0.61–1.24)
GFR, Estimated: 32 mL/min — ABNORMAL LOW (ref >60)
Glucose, Bld: 87 mg/dL (ref 70–99)
Potassium: 4.2 mmol/L (ref 3.5–5.1)
Sodium: 134 mmol/L — ABNORMAL LOW (ref 135–145)
Total Bilirubin: 0.7 mg/dL (ref 0.3–1.2)
Total Protein: 5.7 g/dL — ABNORMAL LOW (ref 6.5–8.1)

## 2021-11-17 LAB — CBC WITH DIFFERENTIAL/PLATELET
Abs Immature Granulocytes: 0.02 10*3/uL (ref 0.00–0.07)
Basophils Absolute: 0 10*3/uL (ref 0.0–0.1)
Basophils Relative: 0 %
Eosinophils Absolute: 0.1 10*3/uL (ref 0.0–0.5)
Eosinophils Relative: 1 %
HCT: 30.3 % — ABNORMAL LOW (ref 39.0–52.0)
Hemoglobin: 9.9 g/dL — ABNORMAL LOW (ref 13.0–17.0)
Immature Granulocytes: 0 %
Lymphocytes Relative: 17 %
Lymphs Abs: 1.4 10*3/uL (ref 0.7–4.0)
MCH: 30.4 pg (ref 26.0–34.0)
MCHC: 32.7 g/dL (ref 30.0–36.0)
MCV: 92.9 fL (ref 80.0–100.0)
Monocytes Absolute: 0.8 10*3/uL (ref 0.1–1.0)
Monocytes Relative: 10 %
Neutro Abs: 5.8 10*3/uL (ref 1.7–7.7)
Neutrophils Relative %: 72 %
Platelets: 150 10*3/uL (ref 150–400)
RBC: 3.26 MIL/uL — ABNORMAL LOW (ref 4.22–5.81)
RDW: 13.6 % (ref 11.5–15.5)
WBC: 8.1 10*3/uL (ref 4.0–10.5)
nRBC: 0 % (ref 0.0–0.2)

## 2021-11-17 LAB — SODIUM, URINE, RANDOM: Sodium, Ur: 62 mmol/L

## 2021-11-17 LAB — CREATININE, URINE, RANDOM: Creatinine, Urine: 168.52 mg/dL

## 2021-11-17 LAB — PHOSPHORUS: Phosphorus: 4.3 mg/dL (ref 2.5–4.6)

## 2021-11-17 LAB — CK: Total CK: 1167 U/L — ABNORMAL HIGH (ref 49–397)

## 2021-11-17 LAB — MAGNESIUM: Magnesium: 2.2 mg/dL (ref 1.7–2.4)

## 2021-11-17 MED ORDER — FINASTERIDE 5 MG PO TABS
5.0000 mg | ORAL_TABLET | Freq: Every day | ORAL | Status: DC
  Administered 2021-11-17 – 2021-11-20 (×4): 5 mg via ORAL
  Filled 2021-11-17 (×4): qty 1

## 2021-11-17 MED ORDER — TRAZODONE HCL 50 MG PO TABS
50.0000 mg | ORAL_TABLET | Freq: Once | ORAL | Status: AC
  Administered 2021-11-17: 50 mg via ORAL
  Filled 2021-11-17: qty 1

## 2021-11-17 MED ORDER — CLOPIDOGREL BISULFATE 75 MG PO TABS: 75.0000 mg | ORAL_TABLET | Freq: Every day | ORAL | Status: DC

## 2021-11-17 MED ORDER — CLOPIDOGREL BISULFATE 75 MG PO TABS
75.0000 mg | ORAL_TABLET | Freq: Every day | ORAL | Status: DC
  Administered 2021-11-17 – 2021-11-20 (×4): 75 mg via ORAL
  Filled 2021-11-17 (×4): qty 1

## 2021-11-17 NOTE — ED Notes (Signed)
Pt in bed, pt reports 8/10 back pain, pt states that he doesn't need anything for pain at this time, pt has some shaking/twitching in his legs, states that this is normal for him.

## 2021-11-17 NOTE — Progress Notes (Signed)
PROGRESS NOTE [name]  XLK:[id number] DOB: January 26, 1946 DOA: 11/16/2021 PCP: [facility]   Brief Narrative/Hospital Course: [name], 76 y.o. male 76 y.o. male with medical history significant for chronic diarrhea, chronic diastolic heart failure, essential pretension, AAA status post stent, CAD status post CABG x3 in March 2012, CKD 3A associated baseline creatinine 1.4-1.5 presented to the ED with complaint of fal at home after he stood up became dizzy lightheaded and subsequently lost consciousness falling to the floor  2/12 night, and was also complaining of very little oral intake x72 hours due to his intractable nausea vomiting that he started after eating dinner at [employer] on Friday. He was seen in the ED underwent extensive imaging studies chest x-ray, CT head, CT C spine, CT abdomen pelvis no acute fracture or finding except for abdominal aortic aneurysm 5.8 X5.3 no dissection or pseudoaneurysm. He was found to have AKI, rhabdomyolysis leukocytosis and and admitted for further work-up of his syncope.     Subjective: Seen this morning.  Wife at the bedside no nausea vomiting.  No shortness of breath.  Has involuntary/constant movement of his upper and lower extremities but more on the lower extremities is chronic as per the wife  Assessment and Plan: * Acute renal failure superimposed on stage 3a chronic kidney disease ([facility])- (present on admission) Baseline creatinine 1.3-1.5.  This in the setting of patient's poor oral intake with recent nausea vomiting.  Improving on IV fluids.  Continue the same Recent Labs  Lab 11/16/21 1642 11/17/21 0220  BUN 50* 45*  CREATININE 2.32* 2.09*    Syncope- (present on admission) Orthostatic syncope positional change.  Extensive imaging no acute finding.  Does have baseline continuous movement of his extremities, has had previous falls.  Continue treatment of AKI dehydration with IV fluids check orthostatics.  PT OT  evaluation  Metabolic acidosis Likely in the setting of AKI on CKD.  Monitor bicarb  Elevated CPK- (present on admission) Mild rhabdomyolysis,after prolonged downtime at home. Continue IV fluids monitor CK level  Nausea & vomiting- (present on admission) Seems to have subsided.  This happened after eating outside on Friday.  CT abdomen pelvis no acute finding.Continue diet as tolerated, continue antiemetics as needed  HLD (hyperlipidemia)- (present on admission) Hold statin due to rhabdomyolysis.  CAD (coronary artery disease)- (present on admission) With prior CABG.  Continue Plavix  AAA (abdominal aortic aneurysm) without rupture- (present on admission) CT abdomen no evidence of dissection or pseudoaneurysm.  No abdominal discomfort he follows up with CT surgery as outpatient  History of CVA (cerebrovascular accident) Stable at this time grossly nonfocal with good strength in upper and lower extremities bilaterally.  Continue his Plavix .  Anemia due to chronic kidney disease Mild anemia noted.  Likely in the setting of CKD.  Monitor,  if no significant drop will need  outpatient PCP f/u for further work-up  Chronic diastolic CHF (congestive heart failure) (HCC) History of diastolic CHF.  Currently hypovolemic and compensated.  Watch closely while on IV fluids.  Last EF 55 to 60% in 2013. Net IO Since Admission: 1,000 mL [11/17/21 1112]  BPH (benign prostatic hyperplasia)- (present on admission) Continues finasteride holding Flomax due to syncope  Prolonged QT interval- (present on admission) Monitor EKG  Leukocytosis- (present on admission) Likely reactive.  It has resolved. Recent Labs  Lab 11/16/21 1642 11/17/21 0220  WBC 10.7* 8.1    PTSD (post-traumatic stress disorder)- (present on admission) Continue with Prozac.  Per wife patient has baseline unsteadiness/tremors of his extremities  DVT prophylaxis: SCDs Start: 11/16/21 1927 Code Status:   Code Status: Full  Code Family Communication: plan of care discussed with patient/wife at bedside.  Disposition: Currently not  medically stable for discharge. Status is: Observation The patient will require care spanning > 2 midnights and should be moved to inpatient because: Ongoing need for IV hydration Is from home.  Objective: Vitals last 24 hrs: Vitals:   11/17/21 0215 11/17/21 0330 11/17/21 0614 11/17/21 0946  BP: (!) 177/67 (!) 177/72 131/62 (!) 159/67  Pulse: (!) 59 66 69 78  Resp: 16  17 18   Temp:   98.4 F (36.9 C) 98.1 F (36.7 C)  TempSrc:   Oral Oral  SpO2: 98% 98% 98% 98%   Weight change:   Physical Examination: General exam: AAox3,older than stated age, weak appearing. HEENT:Oral mucosa moist, Ear/Nose WNL grossly, dentition normal. Respiratory system: bilaterally diminished BS, no use of accessory muscle Cardiovascular system: S1 & S2 +, No JVD,. Gastrointestinal system: Abdomen soft,NT,ND, BS+ Nervous System:Alert, awake, moving extremities and grossly nonfocal, constant movements of his extremities Extremities: LE edema  none,distal peripheral pulses palpable.  Skin: No rashes,no icterus. MSK: Normal muscle bulk,tone, power  Medications reviewed:  Scheduled Meds:  clopidogrel  75 mg Oral Daily   finasteride  5 mg Oral Daily   pantoprazole  40 mg Oral Daily   Continuous Infusions:  lactated ringers 125 mL/hr at 11/17/21 8144      Diet Order             Diet regular Room service appropriate? Yes; Fluid consistency: Thin  Diet effective now                    Intake/Output Summary (Last 24 hours) at 11/17/2021 1115 Last data filed at 11/16/2021 1919 Gross per 24 hour  Intake 1000 ml  Output --  Net 1000 ml   Net IO Since Admission: 1,000 mL [11/17/21 1115]  Wt Readings from Last 3 Encounters:  04/08/16 77 kg  02/15/16 77.1 kg  10/27/15 83.5 kg     Unresulted Labs (From admission, onward)    None     Data Reviewed: I have personally reviewed  following labs and imaging studies CBC: Recent Labs  Lab 11/16/21 1642 11/17/21 0220  WBC 10.7* 8.1  NEUTROABS 8.8* 5.8  HGB 11.1* 9.9*  HCT 33.8* 30.3*  MCV 91.8 92.9  PLT 183 818   Basic Metabolic Panel: Recent Labs  Lab 11/16/21 1642 11/17/21 0220  NA 134* 134*  K 4.8 4.2  CL 104 108  CO2 20* 19*  GLUCOSE 137* 87  BUN 50* 45*  CREATININE 2.32* 2.09*  CALCIUM 8.9 8.4*  MG 2.3 2.2  PHOS  --  4.3   GFR: CrCl cannot be calculated (Unknown ideal weight.). Liver Function Tests: Recent Labs  Lab 11/16/21 1642 11/17/21 0220  AST 32 28  ALT 19 19  ALKPHOS 60 51  BILITOT 0.5 0.7  PROT 6.3* 5.7*  ALBUMIN 4.1 3.6   No results for input(s): LIPASE, AMYLASE in the last 168 hours. No results for input(s): AMMONIA in the last 168 hours. Coagulation Profile: No results for input(s): INR, PROTIME in the last 168 hours. Cardiac Enzymes: Recent Labs  Lab 11/16/21 1642 11/17/21 0220  CKTOTAL 1,832* 1,167*   Radiology Studies: CT ABDOMEN PELVIS WO CONTRAST  Result Date: 11/16/2021 CLINICAL DATA:  Fall EXAM: CT ABDOMEN AND PELVIS WITHOUT CONTRAST TECHNIQUE: Multidetector  CT imaging of the abdomen and pelvis was performed following the standard protocol without IV contrast. RADIATION DOSE REDUCTION: This exam was performed according to the departmental dose-optimization program which includes automated exposure control, adjustment of the mA and/or kV according to patient size and/or use of iterative reconstruction technique. COMPARISON:  CT abdomen pelvis 02/14/2012 FINDINGS: Lower chest: No acute abnormality. Coronary calcification. Tiny hiatal hernia. Liver: Not enlarged. No focal lesion. No laceration or subcapsular hematoma. Biliary System: Status post cholecystectomy. No biliary ductal dilatation. Pancreas: Normal pancreatic contour. No main pancreatic duct dilatation. Spleen: Not enlarged. No focal lesion. No laceration, subcapsular hematoma, or vascular injury. Adrenal  Glands: No nodularity bilaterally. Kidneys: Bilateral kidneys enhance symmetrically. There is a 5.7 x 5.4 cm fluid density lesion within the right kidney that likely represents a simple renal cyst. No hydronephrosis. No contusion, laceration, or subcapsular hematoma. No injury to the vascular structures or collecting systems. No hydroureter. The urinary bladder is unremarkable. Bowel: No small or large bowel wall thickening or dilatation. Few scattered colonic diverticula. The appendix is unremarkable. Mesentery, Omentum, and Peritoneum: No simple free fluid ascites. No pneumoperitoneum. No hemoperitoneum. No mesenteric hematoma identified. No organized fluid collection. Pelvic Organs: Normal. Lymph Nodes: No abdominal, pelvic, inguinal lymphadenopathy. Vasculature: Severe atherosclerotic plaque. Interval increase in size of an infrarenal abdominal aorta aneurysm status post stent graft repair extending from just inferior to superior mesenteric artery down to bilateral common iliac arteries. The aneurysm measures up to 5.8 x 5.3 cm (from 3.8 x 3.5 cm in 2013 pre stenting) on axial imaging. The aneurysm measures approximately 8 cm in the craniocaudal dimension. No active contrast extravasation or pseudoaneurysm. Musculoskeletal: No significant soft tissue hematoma. No acute pelvic fracture. Please see separately dictated CT thoracolumbar spine 11/16/21. IMPRESSION: 1. No acute traumatic injury to the abdomen or pelvis with limited evaluation on this noncontrast study. 2. Please see separately dictated CT thoracolumbar spine 11/16/21. 3. Interval increase in size of a stented infrarenal abdominal aorta aneurysm measuring up to 5.8 x 5.3 cm. Recommend referral to a vascular specialist. This recommendation follows ACR consensus guidelines: White Paper of the ACR Incidental Findings Committee II on Vascular Findings. J Am Coll Radiol 2013; 10:789-794. 4. Tiny hiatal hernia. 5. Colonic diverticulosis with no acute  diverticulitis. 6. Aortic aneurysm NOS (ICD10-I71.9). Aortic Atherosclerosis (ICD10-I70.0). Electronically Signed   By: Iven Finn M.D.   On: 11/16/2021 18:48   CT Head Wo Contrast  Result Date: 11/16/2021 CLINICAL DATA:  Head trauma, minor (Age >= 65y); Neck trauma (Age >= 65y). Fall EXAM: CT HEAD WITHOUT CONTRAST CT CERVICAL SPINE WITHOUT CONTRAST TECHNIQUE: Multidetector CT imaging of the head and cervical spine was performed following the standard protocol without intravenous contrast. Multiplanar CT image reconstructions of the cervical spine were also generated. RADIATION DOSE REDUCTION: This exam was performed according to the departmental dose-optimization program which includes automated exposure control, adjustment of the mA and/or kV according to patient size and/or use of iterative reconstruction technique. COMPARISON:  None. FINDINGS: CT HEAD FINDINGS BRAIN: BRAIN Limited evaluation due to artifact. Patchy and confluent areas of decreased attenuation are noted throughout the deep and periventricular white matter of the cerebral hemispheres bilaterally, compatible with chronic microvascular ischemic disease. No evidence of large-territorial acute infarction. No parenchymal hemorrhage. No mass lesion. No extra-axial collection. No mass effect or midline shift. No hydrocephalus. Basilar cisterns are patent. Vascular: No hyperdense vessel. Atherosclerotic calcifications are present within the cavernous internal carotid arteries. Skull: No acute fracture or focal lesion.  Sinuses/Orbits: Paranasal sinuses and mastoid air cells are clear. The orbits are unremarkable. Other: None. CT CERVICAL SPINE FINDINGS Alignment: Normal. Skull base and vertebrae: Multilevel severe degenerative changes of the spine. No associated severe osseous neural foraminal or central canal stenosis. No acute fracture. No aggressive appearing focal osseous lesion or focal pathologic process. Soft tissues and spinal canal: No  prevertebral fluid or swelling. No visible canal hematoma. Upper chest: Unremarkable. Other: Atherosclerotic plaque of the carotid arteries within the neck. IMPRESSION: 1. No acute intracranial abnormality with limited evaluation due to artifact. 2. No acute displaced fracture or traumatic listhesis of the cervical spine. Electronically Signed   By: Iven Finn M.D.   On: 11/16/2021 17:57   CT Cervical Spine Wo Contrast  Result Date: 11/16/2021 CLINICAL DATA:  Head trauma, minor (Age >= 65y); Neck trauma (Age >= 65y). Fall EXAM: CT HEAD WITHOUT CONTRAST CT CERVICAL SPINE WITHOUT CONTRAST TECHNIQUE: Multidetector CT imaging of the head and cervical spine was performed following the standard protocol without intravenous contrast. Multiplanar CT image reconstructions of the cervical spine were also generated. RADIATION DOSE REDUCTION: This exam was performed according to the departmental dose-optimization program which includes automated exposure control, adjustment of the mA and/or kV according to patient size and/or use of iterative reconstruction technique. COMPARISON:  None. FINDINGS: CT HEAD FINDINGS BRAIN: BRAIN Limited evaluation due to artifact. Patchy and confluent areas of decreased attenuation are noted throughout the deep and periventricular white matter of the cerebral hemispheres bilaterally, compatible with chronic microvascular ischemic disease. No evidence of large-territorial acute infarction. No parenchymal hemorrhage. No mass lesion. No extra-axial collection. No mass effect or midline shift. No hydrocephalus. Basilar cisterns are patent. Vascular: No hyperdense vessel. Atherosclerotic calcifications are present within the cavernous internal carotid arteries. Skull: No acute fracture or focal lesion. Sinuses/Orbits: Paranasal sinuses and mastoid air cells are clear. The orbits are unremarkable. Other: None. CT CERVICAL SPINE FINDINGS Alignment: Normal. Skull base and vertebrae: Multilevel  severe degenerative changes of the spine. No associated severe osseous neural foraminal or central canal stenosis. No acute fracture. No aggressive appearing focal osseous lesion or focal pathologic process. Soft tissues and spinal canal: No prevertebral fluid or swelling. No visible canal hematoma. Upper chest: Unremarkable. Other: Atherosclerotic plaque of the carotid arteries within the neck. IMPRESSION: 1. No acute intracranial abnormality with limited evaluation due to artifact. 2. No acute displaced fracture or traumatic listhesis of the cervical spine. Electronically Signed   By: Iven Finn M.D.   On: 11/16/2021 17:57   CT Thoracic Spine Wo Contrast  Result Date: 11/16/2021 CLINICAL DATA:  Back trauma, no prior imaging (Age >= 16y). Status post fall EXAM: CT THORACIC AND LUMBAR SPINE WITHOUT CONTRAST TECHNIQUE: Multidetector CT imaging of the thoracic and lumbar spine was performed without contrast. Multiplanar CT image reconstructions were also generated. RADIATION DOSE REDUCTION: This exam was performed according to the departmental dose-optimization program which includes automated exposure control, adjustment of the mA and/or kV according to patient size and/or use of iterative reconstruction technique. COMPARISON:  None. FINDINGS: CT THORACIC SPINE FINDINGS Alignment: Normal. Vertebrae: Multilevel contiguous moderate osteophyte formation. No acute fracture or focal pathologic process. Paraspinal and other soft tissues: Negative. Disc levels: Maintained. CT LUMBAR SPINE FINDINGS Segmentation: 5 lumbar type vertebrae. Alignment: Normal. Vertebrae: Multilevel moderate osteophyte formation. No acute fracture or focal pathologic process. Paraspinal and other soft tissues: Negative. Disc levels: Maintained. Visualized portions of the thorax: Right lower lobe subsegmental atelectasis. Atherosclerotic plaque of the aorta. Four-vessel coronary  calcification. Sternotomy wires. Visualized portions of the  abdomen and pelvis: Please see separately dictated CT abdomen pelvis 11/16/2021. IMPRESSION: CT THORACIC SPINE IMPRESSION No acute displaced fracture or traumatic listhesis of the thoracic spine. CT LUMBAR SPINE IMPRESSION No acute displaced fracture or traumatic listhesis of the lumbar spine. Please see separately dictated CT abdomen pelvis 11/16/2021. Aortic aneurysm NOS (ICD10-I71.9). Aortic Atherosclerosis (ICD10-I70.0). Electronically Signed   By: Iven Finn M.D.   On: 11/16/2021 18:51   CT L-SPINE NO CHARGE  Result Date: 11/16/2021 CLINICAL DATA:  Back trauma, no prior imaging (Age >= 16y). Status post fall EXAM: CT THORACIC AND LUMBAR SPINE WITHOUT CONTRAST TECHNIQUE: Multidetector CT imaging of the thoracic and lumbar spine was performed without contrast. Multiplanar CT image reconstructions were also generated. RADIATION DOSE REDUCTION: This exam was performed according to the departmental dose-optimization program which includes automated exposure control, adjustment of the mA and/or kV according to patient size and/or use of iterative reconstruction technique. COMPARISON:  None. FINDINGS: CT THORACIC SPINE FINDINGS Alignment: Normal. Vertebrae: Multilevel contiguous moderate osteophyte formation. No acute fracture or focal pathologic process. Paraspinal and other soft tissues: Negative. Disc levels: Maintained. CT LUMBAR SPINE FINDINGS Segmentation: 5 lumbar type vertebrae. Alignment: Normal. Vertebrae: Multilevel moderate osteophyte formation. No acute fracture or focal pathologic process. Paraspinal and other soft tissues: Negative. Disc levels: Maintained. Visualized portions of the thorax: Right lower lobe subsegmental atelectasis. Atherosclerotic plaque of the aorta. Four-vessel coronary calcification. Sternotomy wires. Visualized portions of the abdomen and pelvis: Please see separately dictated CT abdomen pelvis 11/16/2021. IMPRESSION: CT THORACIC SPINE IMPRESSION No acute displaced  fracture or traumatic listhesis of the thoracic spine. CT LUMBAR SPINE IMPRESSION No acute displaced fracture or traumatic listhesis of the lumbar spine. Please see separately dictated CT abdomen pelvis 11/16/2021. Aortic aneurysm NOS (ICD10-I71.9). Aortic Atherosclerosis (ICD10-I70.0). Electronically Signed   By: Iven Finn M.D.   On: 11/16/2021 18:51   DG Chest Port 1 View  Result Date: 11/16/2021 CLINICAL DATA:  Provided history: Weakness, syncope. Additional history provided: Fall, found on floor by caregiver. EXAM: PORTABLE CHEST 1 VIEW COMPARISON:  Prior chest radiographs 12/05/2012 and earlier. FINDINGS: Please note a portion of the left lateral costophrenic angle is excluded from the field of view. Prior median sternotomy/CABG. Heart size within normal limits. No appreciable airspace consolidation or pulmonary edema. No evidence of pleural effusion or pneumothorax. No acute bony abnormality identified. IMPRESSION: A portion of the left lateral costophrenic angle is excluded from the field of view. No evidence of acute cardiopulmonary abnormality. Electronically Signed   By: Kellie Simmering D.O.   On: 11/16/2021 16:46     LOS: 0 days   [name], MD [facility]  11/17/2021, 11:15 AM

## 2021-11-17 NOTE — Assessment & Plan Note (Addendum)
Stable at this time grossly nonfocal with good strength in upper and lower extremities bilaterally.  Continue his Plavix-daughter confirmed that patient texted, advised to follow-up with neurology.

## 2021-11-17 NOTE — Assessment & Plan Note (Signed)
Monitor EKG

## 2021-11-17 NOTE — Assessment & Plan Note (Addendum)
Patient had stent placed back in November 2022 and scant following his initial procedure by [facility] showed some leakage and being monitored by his vascular surgery at [facility]  No abdominal discomfort he follows up with [facility]-he had done in Nov 2022, and wife says he has had leakage. It was 5.6  Or 5.8 cm.  Confirmed with patient's daughter, NP as well, he is followed by [facility] with [facility] monthly CT scans.

## 2021-11-17 NOTE — ED Notes (Signed)
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 pulmonary edema. No evidence of pleural effusion or pneumothorax. No acute bony abnormality identified. IMPRESSION: A portion of the left lateral costophrenic angle is excluded from the field of view. No evidence of acute cardiopulmonary abnormality. Electronically Signed   By: Kellie Simmering D.O.   On: 11/16/2021 16:46    Pending Labs Unresulted Labs (From admission, onward)     Start     Ordered   11/18/21 6767  Basic metabolic panel  Daily,   R     Question:  Specimen collection method  Answer:  Lab=Lab collect   11/17/21 1116   11/18/21 0500  CBC  Tomorrow morning,   R       Question:  Specimen collection method  Answer:  Lab=Lab  collect   11/17/21 1116            Vitals/Pain Today's Vitals   11/17/21 0215 11/17/21 0330 11/17/21 0614 11/17/21 0946  BP: (!) 177/67 (!) 177/72 131/62 (!) 159/67  Pulse: (!) 59 66 69 78  Resp: 16  17 18   Temp:   98.4 F (36.9 C) 98.1 F (36.7 C)  TempSrc:   Oral Oral  SpO2: 98% 98% 98% 98%  PainSc:    8     Isolation Precautions No active isolations  Medications Medications  acetaminophen (TYLENOL) tablet 650 mg (has no administration in time range)    Or  acetaminophen (TYLENOL) suppository 650 mg (has no administration in time range)  lactated ringers infusion ( Intravenous Rate/Dose Change 11/17/21 1131)  LORazepam (ATIVAN) injection 0.5 mg (has no administration in time range)  clopidogrel (PLAVIX) tablet 75 mg (75 mg Oral Not Given 11/17/21 0955)  finasteride (PROSCAR) tablet 5 mg (5 mg Oral Not Given 11/17/21 0955)  pantoprazole (PROTONIX) EC tablet 40 mg (40 mg Oral Given 11/17/21 0955)  sodium chloride 0.9 % bolus 500 mL (0 mLs Intravenous Stopped 11/16/21 1738)  sodium chloride 0.9 % bolus 500 mL (0 mLs Intravenous Stopped 11/16/21 1919)    Mobility walks Moderate fall risk      R Recommendations: See Admitting Provider Note  Report given to:   Additional Notes:

## 2021-11-17 NOTE — Assessment & Plan Note (Addendum)
Resolved.Likely in the setting of AKI on CKD.

## 2021-11-17 NOTE — Assessment & Plan Note (Addendum)
Orthostatic syncope, in the setting of chronic orthostatic hypotension worsened by his dehydration AKI.  ATN asymptomatic at this time although blood pressure fluctuation diuresis.  Continue PT OT plan for skilled nursing facility.  Extensive imaging in the ED with CTA chest x-ray CT C-spine CT T-spine CT abdomen pelvis.

## 2021-11-17 NOTE — Assessment & Plan Note (Addendum)
Resolved.Tolerating diet.  CT abdomen pelvis no acute finding in GI system

## 2021-11-17 NOTE — Assessment & Plan Note (Addendum)
Hold statin due to rhabdomyolysis.  Resume upon discharge

## 2021-11-17 NOTE — Assessment & Plan Note (Addendum)
Continue with Prozac.  Per wife patient has baseline unsteadiness/tremors of his extremities

## 2021-11-17 NOTE — Assessment & Plan Note (Addendum)
Mild anemia noted.  Likely in the setting of CKD.f/u PCP, cbc in 1 wk Recent Labs  Lab 11/16/21 1642 11/17/21 0220 11/18/21 0604  HGB 11.1* 9.9* 9.8*  HCT 33.8* 30.3* 30.0*

## 2021-11-17 NOTE — Assessment & Plan Note (Addendum)
Likely reactive.  It has resolved. Recent Labs  Lab 11/16/21 1642 11/17/21 0220 11/18/21 0604  WBC 10.7* 8.1 6.2

## 2021-11-17 NOTE — Assessment & Plan Note (Addendum)
Continues finasteride holding Flomax due to orthosatics hypotension-advised to discuss with PCP.

## 2021-11-17 NOTE — Assessment & Plan Note (Signed)
With prior CABG.  Continue Plavix

## 2021-11-17 NOTE — Hospital Course (Addendum)
76 y.o. male with medical history significant for chronic diarrhea, chronic diastolic heart failure, essential pretension, AAA status post stent, CAD status post CABG x3 in March 2012, CKD 3A associated baseline creatinine 1.4-1.5 presented to the ED with complaint of fal at home after he stood up became dizzy lightheaded and subsequently lost consciousness falling to the floor  2/12 night, and was also complaining of very little oral intake x72 hours due to his intractable nausea vomiting that he started after eating dinner at [employer] on Friday. He was seen in the ED underwent extensive imaging studies chest x-ray, CT head, CT C spine, CT abdomen pelvis no acute fracture or finding except for abdominal aortic aneurysm 5.8 X5.3 no dissection or pseudoaneurysm. He was found to have AKI, rhabdomyolysis leukocytosis and and admitted for further work-up of his syncope.  Was hydrated with IV fluids, AKI has resolved resume function at baseline, he does have baseline orthostatic labile hypertension for years.  He will continue with compression stocking subjective effective can try abdominal binder.  PT OT has recommended short-term skilled nursing facility and is being discharged today

## 2021-11-17 NOTE — Assessment & Plan Note (Addendum)
Baseline creatinine 1.3-1.5 w/ CKD. Admitted w/ AKI due to patient's poor oral intake with recent nausea vomiting.  AKI improved to 1.6, stable, continue gentle IV fluids.  Encourage oral intake.  Per wife he does not like to drink liquids at all, I counseled extensively Recent Labs  Lab 11/16/21 1642 11/17/21 0220 11/18/21 0604 11/19/21 0501 11/20/21 0522  BUN 50* 45* 36* 29* 26*  CREATININE 2.32* 2.09* 1.83* 1.65* 1.64*

## 2021-11-17 NOTE — ED Notes (Signed)
Report to [facility], pt states that he doesn't need anything before he goes upstairs, states that he doesn't need med for his back pain at this time

## 2021-11-17 NOTE — Assessment & Plan Note (Addendum)
Mild rhabdomyolysis,after prolonged downtime at home.  Improved.

## 2021-11-17 NOTE — Assessment & Plan Note (Addendum)
History of diastolic CHF.  Currently hypovolemic and compensated.  Watch closely while on IV fluids.  Last EF 55 to 60% in 2013.  Weaning of IV fluids Net IO Since Admission: 2,483.91 mL [11/20/21 1003]

## 2021-11-18 DIAGNOSIS — I951 Orthostatic hypotension: Secondary | ICD-10-CM

## 2021-11-18 DIAGNOSIS — G2581 Restless legs syndrome: Secondary | ICD-10-CM

## 2021-11-18 LAB — BASIC METABOLIC PANEL
Anion gap: 8 (ref 5–15)
BUN: 36 mg/dL — ABNORMAL HIGH (ref 8–23)
CO2: 21 mmol/L — ABNORMAL LOW (ref 22–32)
Calcium: 8.3 mg/dL — ABNORMAL LOW (ref 8.9–10.3)
Chloride: 109 mmol/L (ref 98–111)
Creatinine, Ser: 1.83 mg/dL — ABNORMAL HIGH (ref 0.61–1.24)
GFR, Estimated: 38 mL/min — ABNORMAL LOW (ref >60)
Glucose, Bld: 93 mg/dL (ref 70–99)
Potassium: 4 mmol/L (ref 3.5–5.1)
Sodium: 138 mmol/L (ref 135–145)

## 2021-11-18 LAB — CBC
HCT: 30 % — ABNORMAL LOW (ref 39.0–52.0)
Hemoglobin: 9.8 g/dL — ABNORMAL LOW (ref 13.0–17.0)
MCH: 30.3 pg (ref 26.0–34.0)
MCHC: 32.7 g/dL (ref 30.0–36.0)
MCV: 92.9 fL (ref 80.0–100.0)
Platelets: 147 10*3/uL — ABNORMAL LOW (ref 150–400)
RBC: 3.23 MIL/uL — ABNORMAL LOW (ref 4.22–5.81)
RDW: 13.7 % (ref 11.5–15.5)
WBC: 6.2 10*3/uL (ref 4.0–10.5)
nRBC: 0 % (ref 0.0–0.2)

## 2021-11-18 MED ORDER — ENSURE ENLIVE PO LIQD
237.0000 mL | Freq: Two times a day (BID) | ORAL | Status: DC
  Administered 2021-11-19 – 2021-11-20 (×2): 237 mL via ORAL

## 2021-11-18 MED ORDER — ADULT MULTIVITAMIN W/MINERALS CH
1.0000 | ORAL_TABLET | Freq: Every day | ORAL | Status: DC
  Administered 2021-11-18 – 2021-11-20 (×3): 1 via ORAL
  Filled 2021-11-18 (×3): qty 1

## 2021-11-18 MED ORDER — TRAZODONE HCL 50 MG PO TABS
50.0000 mg | ORAL_TABLET | Freq: Once | ORAL | Status: AC
Start: 2021-11-18 — End: 2021-11-18
  Administered 2021-11-18: 50 mg via ORAL
  Filled 2021-11-18: qty 1

## 2021-11-18 NOTE — Evaluation (Signed)
Physical Therapy Evaluation Patient Details Name: [name] MRN: [id number] DOB: 03/04/1946 Today's Date: 11/18/2021  History of Present Illness  Patient is a 76 year old male who presented to the hosptial after a fall at home after dizziness onset. patient was found to have acute renal failuire superimposed on stage III CKD, PMH: diastolic CHF, AAA s/p stent, CAD s/p CABG x3, PTSD  Clinical Impression  Pt admitted with above diagnosis. Pt currently with functional limitations due to the deficits listed below (see PT Problem List). Pt will benefit from skilled PT to increase their independence and safety with mobility to allow discharge to the venue listed below.   Pt reports syncopal episode at home and does not recall fall.  Pt states he does have some balance issues at baseline and typically has LOB backwards.  Pt also with tremulous extremities however improved with weight bearing tasks (hx of tremors).  Performed orthostatic vitals as below and pt assisted to recliner.  RN and MD aware.     11/18/21 0950  Vital Signs  Patient Position (if appropriate) Orthostatic Vitals  Orthostatic Lying   BP- Lying 179/63  Pulse- Lying 64  Orthostatic Sitting  BP- Sitting (!) 140/114  Pulse- Sitting 106  Orthostatic Standing at 0 minutes  BP- Standing at 0 minutes 112/54  Pulse- Standing at 0 minutes 80  Orthostatic Standing at 3 minutes  BP- Standing at 3 minutes 123/62  Pulse- Standing at 3 minutes 88       Recommendations for follow up therapy are one component of a multi-disciplinary discharge planning process, led by the attending physician.  Recommendations may be updated based on patient status, additional functional criteria and insurance authorization.  Follow Up Recommendations Skilled nursing-short term rehab (<3 hours/day)    Assistance Recommended at Discharge Frequent or constant Supervision/Assistance  Patient can return home with the following  A little help with  walking and/or transfers;A little help with bathing/dressing/bathroom    Equipment Recommendations None recommended by PT  Recommendations for Other Services       Functional Status Assessment Patient has had a recent decline in their functional status and demonstrates the ability to make significant improvements in function in a reasonable and predictable amount of time.     Precautions / Restrictions Precautions Precautions: Fall Precaution Comments: orthostatic      Mobility  Bed Mobility Overal bed mobility: Needs Assistance Bed Mobility: Rolling, Sidelying to Sit Rolling: Min guard Sidelying to sit: Min guard       General bed mobility comments: cues for self assist, increased time and effort    Transfers Overall transfer level: Needs assistance Equipment used: Rolling walker (2 wheels) Transfers: Sit to/from Stand Sit to Stand: Min assist, +2 safety/equipment           General transfer comment: light assist to rise and steady, obtained orthostatic vitals then transferred to recliner, pt reports typical posterior LOB and falls however none observed today, pt was positive for orthostatics    Ambulation/Gait                  Stairs            Wheelchair Mobility    Modified Rankin (Stroke Patients Only)       Balance Overall balance assessment: Needs assistance, History of Falls Sitting-balance support: No upper extremity supported, Feet supported Sitting balance-Leahy Scale: Fair     Standing balance support: Reliant on assistive device for balance, Bilateral upper extremity supported Standing  balance-Leahy Scale: Poor                               Pertinent Vitals/Pain Pain Assessment Pain Assessment: Faces Faces Pain Scale: Hurts little more Pain Location: back Pain Descriptors / Indicators: Discomfort, Grimacing Pain Intervention(s): Monitored during session, Repositioned    Home Living Family/patient expects to  be discharged to:: Private residence Living Arrangements: Alone   Type of Home: House Home Access: Stairs to enter       Home Layout: One level Home Equipment: Conservation officer, nature (2 wheels);Rollator (4 wheels);Transport chair Additional Comments: spouse lives with her parents as a caretaker nearby, spouse's home has multiple one steps to enter, spouse has walk in shower with grab bars    Prior Function Prior Level of Function : Independent/Modified Independent             Mobility Comments: has had numerous falls over the years typically backwards ADLs Comments: patient was independent in simple meal prep tasks and ADLs prior level with reports of chronic falls.     Hand Dominance        Extremity/Trunk Assessment   Upper Extremity Assessment Upper Extremity Assessment: RUE deficits/detail;LUE deficits/detail RUE Deficits / Details: able to raise up to about 90 degrees for flexion and abduction of shoulder, noted to have signts of possible arthritis in digits. RUE Coordination: decreased fine motor LUE Deficits / Details: able to raise up to about 90 degrees for flexion and abduction of shoulder, noted to have signs of possible arthritis in digits LUE Coordination: decreased fine motor    Lower Extremity Assessment Lower Extremity Assessment: Generalized weakness (tremulous LEs when not weight bearing however has hx of this)    Cervical / Trunk Assessment Cervical / Trunk Assessment: Normal;Other exceptions Cervical / Trunk Exceptions: pt has scrapes and multiple areas of bruising to his back - RN notified  Communication   Communication: No difficulties  Cognition Arousal/Alertness: Awake/alert Behavior During Therapy: WFL for tasks assessed/performed                                   General Comments: patient was cooperative with session. patient noted to have some short term memory deficits with repeating questions at end of session. appeared to have  better insight to how much assistance is needed at home during this session.        General Comments      Exercises     Assessment/Plan    PT Assessment Patient needs continued PT services  PT Problem List Decreased mobility;Decreased balance;Decreased activity tolerance;Cardiopulmonary status limiting activity;Decreased strength;Decreased knowledge of precautions;Decreased safety awareness;Decreased knowledge of use of DME       PT Treatment Interventions DME instruction;Gait training;Balance training;Therapeutic exercise;Therapeutic activities;Patient/family education;Functional mobility training    PT Goals (Current goals can be found in the Care Plan section)  Acute Rehab PT Goals PT Goal Formulation: With patient Time For Goal Achievement: 11/25/21 Potential to Achieve Goals: Good    Frequency Min 3X/week     Co-evaluation   Reason for Co-Treatment: For patient/therapist safety;To address functional/ADL transfers PT goals addressed during session: Mobility/safety with mobility OT goals addressed during session: ADL's and self-care       AM-PAC PT "6 Clicks" Mobility  Outcome Measure Help needed turning from your back to your side while in a flat bed without using bedrails?: A  Little Help needed moving from lying on your back to sitting on the side of a flat bed without using bedrails?: A Little Help needed moving to and from a bed to a chair (including a wheelchair)?: A Little Help needed standing up from a chair using your arms (e.g., wheelchair or bedside chair)?: A Lot Help needed to walk in hospital room?: A Lot Help needed climbing 3-5 steps with a railing? : A Lot 6 Click Score: 15    End of Session Equipment Utilized During Treatment: Gait belt Activity Tolerance: Patient tolerated treatment well Patient left: in chair;with call bell/phone within reach;with chair alarm set;with family/visitor present Nurse Communication: Mobility status PT Visit  Diagnosis: Difficulty in walking, not elsewhere classified (R26.2)    Time: 2505-3976 PT Time Calculation (min) (ACUTE ONLY): 23 min   Charges:   PT Evaluation $PT Eval Low Complexity: 1 Low        Kati PT, DPT Acute Rehabilitation Services Pager: [phone] Office: [place] 11/18/2021, 1:19 PM

## 2021-11-18 NOTE — Progress Notes (Signed)
Initial Nutrition Assessment  DOCUMENTATION CODES:   Non-severe (moderate) malnutrition in context of chronic illness  INTERVENTION:   -Ensure Plus High Protein po BID, each supplement provides 350 kcal and 20 grams of protein.  -Multivitamin with minerals daily  -Placed "IBS Nutrition Therapy" handout in discharge instructions   NUTRITION DIAGNOSIS:   Moderate Malnutrition related to chronic illness (CHF, h/o CVA, chronic diarrhea) as evidenced by percent weight loss, severe muscle depletion, moderate fat depletion.  GOAL:   Patient will meet greater than or equal to 90% of their needs  MONITOR:   PO intake, Supplement acceptance, Labs, Weight trends, I & O's  REASON FOR ASSESSMENT:   Malnutrition Screening Tool    ASSESSMENT:   76 y.o. male with medical history significant for chronic diarrhea, chronic diastolic heart failure, essential pretension, AAA status post stent, CAD status post CABG x3 in March 2012, CKD 3A associated baseline creatinine 1.4-1.5 presented to the ED with complaint of fal at home after he stood up became dizzy lightheaded and subsequently lost consciousness falling to the floor  2/12 night, and was also complaining of very little oral intake x72 hours due to his intractable nausea vomiting that he started after eating dinner at [employer] on Friday.  Patient in room, wife at bedside. Pt reports he is eating with good appetite. States he has had more difficulty cooking like he used to, pt's wife states he eats more microwaveable meals. He is hopeful he can get back to cooking again. Really likes bologna sandwiches and would eat them with every meal if he could despite needing to watch his sodium intake. Wife has requested some diet information to help with his IBS-D, placed handout in discharge instructions and discussed briefly with pt and wife.   PTA pt was having N/V since eating at [facility] on 2/10. He denies issues swallowing and chews  well despite having no teeth.   Per pt he has lost 40 lbs but pt's wife states she thinks he has only lost 10 lbs.  Per weight records, pt has lost 7 lbs since doctors appointment on 2/9 (weighed 160 lbs then). This is a 4% wt loss x 1 week, significant for time frame).   Medications: Lactated ringers  Labs reviewed.  NUTRITION - FOCUSED PHYSICAL EXAM:  Flowsheet Row Most Recent Value  Orbital Region Moderate depletion  Upper Arm Region Moderate depletion  Thoracic and Lumbar Region Moderate depletion  Buccal Region Severe depletion  Temple Region Moderate depletion  Clavicle Bone Region Severe depletion  Clavicle and Acromion Bone Region Severe depletion  Scapular Bone Region Severe depletion  Dorsal Hand Severe depletion  Patellar Region Severe depletion  Anterior Thigh Region Severe depletion  Posterior Calf Region Severe depletion  Edema (RD Assessment) None  Hair Reviewed  Eyes Reviewed  Mouth Reviewed  [no teeth, denies chewing issues]  Skin Reviewed       Diet Order:   Diet Order             Diet regular Room service appropriate? Yes; Fluid consistency: Thin  Diet effective now                   EDUCATION NEEDS:   Education needs have been addressed  Skin:  Skin Assessment: Reviewed RN Assessment  Last BM:  PTA  Height:   Ht Readings from Last 1 Encounters:  11/17/21 5\' 9"  (1.753 m)    Weight:   Wt Readings from Last 1 Encounters:  11/18/21 69.5 kg    BMI:  Body mass index is 22.63 kg/m.  Estimated Nutritional Needs:   Kcal:  2100-2300  Protein:  100-110g  Fluid:  2.1L/day   Clayton Bibles, MS, RD, LDN Inpatient Clinical Dietitian Contact information available via Amion

## 2021-11-18 NOTE — Evaluation (Signed)
Occupational Therapy Evaluation Patient Details Name: [name] MRN: [id number] DOB: 11/20/1945 Today's Date: 11/18/2021   History of Present Illness Patient is a 76 year old male who presented to the hosptial after a fall at home after dizziness onset. patient was found to have acute renal failuire superimposed on stage III CKD, PMH: diastolic CHF, AAA s/p stent, CAD s/p CABG x3, PTSD   Clinical Impression   Patient is a 76 year old male who was noted to have had multiple falls per patient and wife report prior to presentation to hospital. Patient  was noted to have continuous movement of mouth and BLE. Patient was noted to have decreased activity tolerance, decreased safety awareness, decreased standing balance, decreased endurance impacting participation in ADLs. Patient would continue to benefit from skilled OT services at this time while admitted and after d/c to address noted deficits in order to improve overall safety and independence in ADLs.       Recommendations for follow up therapy are one component of a multi-disciplinary discharge planning process, led by the attending physician.  Recommendations may be updated based on patient status, additional functional criteria and insurance authorization.   Follow Up Recommendations  Skilled nursing-short term rehab (<3 hours/day)    Assistance Recommended at Discharge Frequent or constant Supervision/Assistance  Patient can return home with the following A little help with walking and/or transfers;A little help with bathing/dressing/bathroom;Assistance with cooking/housework;Direct supervision/assist for financial management;Help with stairs or ramp for entrance;Direct supervision/assist for medications management;Assist for transportation    Functional Status Assessment  Patient has had a recent decline in their functional status and demonstrates the ability to make significant improvements in function in a reasonable and  predictable amount of time.  Equipment Recommendations  Other (comment) (TBD)    Recommendations for Other Services       Precautions / Restrictions        Mobility Bed Mobility Overal bed mobility: Needs Assistance             General bed mobility comments: patient was seated on edge of bed with PT upon entrance to room    Transfers                          Balance Overall balance assessment: Needs assistance Sitting-balance support: No upper extremity supported, Feet supported Sitting balance-Leahy Scale: Fair     Standing balance support: Reliant on assistive device for balance, Bilateral upper extremity supported Standing balance-Leahy Scale: Poor                             ADL either performed or assessed with clinical judgement   ADL Overall ADL's : Needs assistance/impaired Eating/Feeding: Set up;Sitting   Grooming: Wash/dry face;Wash/dry hands;Sitting Grooming Details (indicate cue type and reason): patient declined to complete oral care. patient was educated on importance of participation in oral care daily. Upper Body Bathing: Minimal assistance;Sitting   Lower Body Bathing: Sit to/from stand;Sitting/lateral leans;Maximal assistance   Upper Body Dressing : Sitting;Minimal assistance   Lower Body Dressing: Sit to/from stand;Sitting/lateral leans;Maximal assistance   Toilet Transfer: Minimal assistance;+2 for safety/equipment;+2 for physical assistance Toilet Transfer Details (indicate cue type and reason): with increased time to recliner. patient was noted to be orthostatic with bps with transitions. patient denies dizziness at this time or change in status. Toileting- Clothing Manipulation and Hygiene: Maximal assistance;Sit to/from stand  Vision Patient Visual Report: No change from baseline       Perception     Praxis      Pertinent Vitals/Pain Pain Assessment Pain Assessment: Faces Faces Pain  Scale: Hurts little more Pain Location: back Pain Descriptors / Indicators: Discomfort, Grimacing Pain Intervention(s): Monitored during session, Repositioned     Hand Dominance     Extremity/Trunk Assessment Upper Extremity Assessment Upper Extremity Assessment: RUE deficits/detail;LUE deficits/detail RUE Deficits / Details: able to raise up to about 90 degrees for flexion and abduction of shoulder, noted to have signts of possible arthritis in digits. RUE Coordination: decreased fine motor LUE Deficits / Details: able to raise up to about 90 degrees for flexion and abduction of shoulder, noted to have signs of possible arthritis in digits LUE Coordination: decreased fine motor   Lower Extremity Assessment Lower Extremity Assessment:  (noted to have continous movement of BLE when not WB through them.)   Cervical / Trunk Assessment Cervical / Trunk Assessment: Normal   Communication Communication Communication: No difficulties   Cognition Arousal/Alertness: Awake/alert Behavior During Therapy: WFL for tasks assessed/performed Overall Cognitive Status: Difficult to assess                                 General Comments: patient was cooperative with session. patient noted to have some short term memory deficits with repeating questions at end of session. appeared to have better insight to how much assistance is needed at home during this session.     General Comments       Exercises     Shoulder Instructions      Home Living Family/patient expects to be discharged to:: Private residence Living Arrangements: Alone   Type of Home: House Home Access: Stairs to enter     Home Layout: One level     Bathroom Shower/Tub: Tub only   Biochemist, clinical: Standard     Home Equipment: Conservation officer, nature (2 wheels);Rollator (4 wheels);Transport chair   Additional Comments: spouse lives with her parents as a caretaker nearby, spouse's home has multiple one steps to  enter, spouse has walk in shower with grab bars      Prior Functioning/Environment Prior Level of Function : Independent/Modified Independent             Mobility Comments: has had numerous falls over the years typically backwards ADLs Comments: patient was independent in simple meal prep tasks and ADLs prior level with reports of chronic falls.        OT Problem List: Decreased activity tolerance;Impaired balance (sitting and/or standing);Decreased safety awareness;Decreased knowledge of precautions;Decreased knowledge of use of DME or AE;Cardiopulmonary status limiting activity      OT Treatment/Interventions: Self-care/ADL training;Therapeutic exercise;Neuromuscular education;DME and/or AE instruction;Therapeutic activities;Balance training;Patient/family education    OT Goals(Current goals can be found in the care plan section) Acute Rehab OT Goals Patient Stated Goal: to get better OT Goal Formulation: With patient Time For Goal Achievement: 12/02/21 Potential to Achieve Goals: Good  OT Frequency: Min 2X/week    Co-evaluation PT/OT/SLP Co-Evaluation/Treatment: Yes Reason for Co-Treatment: For patient/therapist safety;To address functional/ADL transfers PT goals addressed during session: Mobility/safety with mobility OT goals addressed during session: ADL's and self-care      AM-PAC OT "6 Clicks" Daily Activity     Outcome Measure Help from another person eating meals?: A Little Help from another person taking care of personal grooming?: A Little Help from another person  toileting, which includes using toliet, bedpan, or urinal?: A Lot Help from another person bathing (including washing, rinsing, drying)?: A Lot Help from another person to put on and taking off regular upper body clothing?: A Lot Help from another person to put on and taking off regular lower body clothing?: A Lot 6 Click Score: 14   End of Session Equipment Utilized During Treatment: Gait  belt;Rolling walker (2 wheels)  Activity Tolerance: Patient tolerated treatment well Patient left: in chair;with chair alarm set;with call bell/phone within reach;Other (comment) (MD in room)  OT Visit Diagnosis: Unsteadiness on feet (R26.81);History of falling (Z91.81);Repeated falls (R29.6);Other abnormalities of gait and mobility (R26.89);Muscle weakness (generalized) (M62.81)                Time: 1460-4799 OT Time Calculation (min): 18 min Charges:  OT General Charges $OT Visit: 1 Visit OT Evaluation $OT Eval Moderate Complexity: 1 Mod  Jackelyn Poling OTR/L, MS Acute Rehabilitation Department Office# [phone] Pager# [phone]   Marcellina Millin 11/18/2021, 12:00 PM

## 2021-11-18 NOTE — Assessment & Plan Note (Addendum)
At baseline for many years.  Supportive care fall precaution

## 2021-11-18 NOTE — Progress Notes (Signed)
PROGRESS NOTE GAUTAM LANGHORST  RWE:[id number] DOB: July 22, 1946 DOA: 11/16/2021 PCP: [facility]   Brief Narrative/Hospital Course: ARRIN PINTOR, 76 y.o. male 76 y.o. male with medical history significant for chronic diarrhea, chronic diastolic heart failure, essential pretension, AAA status post stent, CAD status post CABG x3 in March 2012, CKD 3A associated baseline creatinine 1.4-1.5 presented to the ED with complaint of fal at home after he stood up became dizzy lightheaded and subsequently lost consciousness falling to the floor  2/12 night, and was also complaining of very little oral intake x72 hours due to his intractable nausea vomiting that he started after eating dinner at [employer] on Friday. He was seen in the ED underwent extensive imaging studies chest x-ray, CT head, CT C spine, CT abdomen pelvis no acute fracture or finding except for abdominal aortic aneurysm 5.8 X5.3 no dissection or pseudoaneurysm. He was found to have AKI, rhabdomyolysis leukocytosis and and admitted for further work-up of his syncope.     Subjective: Seen this morning.  Wife at the bedside no nausea vomiting.  No shortness of breath.  Has involuntary/constant movement of his upper and lower extremities but more on the lower extremities is chronic as per the wife Bp fluctuate at baseline  Assessment and Plan: * Acute renal failure superimposed on stage 3a chronic kidney disease ([place])- (present on admission) Baseline creatinine 1.3-1.5.  This in the setting of patient's poor oral intake with recent nausea vomiting.  Improving on IV fluids, still orthostatic- we will cont with IVF.  However chronically orthostatic Recent Labs  Lab 11/16/21 1642 11/17/21 0220 11/18/21 0604  BUN 50* 45* 36*  CREATININE 2.32* 2.09* 1.83*    Autonomic orthostatic hypotension 179/63>140/114> 112- but it is baseline. His BP varies at baseline and no compliant with compression stocking-extensively discussed  with patient's daughter was a Designer, jewellery.  I encouraged to wear compression stocking and ordered.  Discussed about how to minimize fall and to seek help with ambulation.   Syncope- (present on admission) Orthostatic syncope, in the setting of chronic orthostatic hypotension worsened by his dehydration AKI.  Extensive imaging in the ED with CTA chest x-ray CT C-spine CT T-spine CT abdomen pelvis.  PT OT eval fall precaution  Metabolic acidosis Likely in the setting of AKI on CKD.  Monitor bicarb.  Elevated CPK- (present on admission) Mild rhabdomyolysis,after prolonged downtime at home.  Gentle IV fluid hydration, CK downtrending  Nausea & vomiting- (present on admission) Resolved.  Tolerating diet.  CT abdomen pelvis no acute finding in GI system  HLD (hyperlipidemia)- (present on admission) Hold statin due to rhabdomyolysis.  CAD (coronary artery disease)- (present on admission) With prior CABG.  Continue Plavix  AAA (abdominal aortic aneurysm) without rupture- (present on admission) Patient had stent placed back in November 2022 and scant following his initial procedure by [facility] showed some leakage and being monitored by his vascular surgery [facility]  No abdominal discomfort he follows up with [facility]-he had done in Nov 2022, and wife says he has had leakage. It was 5.6  Or 5.8 cm.  Confirmed with patient's daughter, NP as well, [facility] CT [facility].  History of CVA (cerebrovascular accident) Stable at this time grossly nonfocal with good strength in upper and lower extremities bilaterally.  Continue his Plavix .  Anemia due to chronic kidney disease Mild anemia noted.  Likely in the setting of CKD.  Monitor,  if no significant drop will need  outpatient PCP f/u for further work-up Recent Labs  Lab 11/16/21 1642 11/17/21 0220 11/18/21 0604  HGB 11.1* 9.9* 9.8*  HCT 33.8* 30.3* 30.0*    Chronic diastolic CHF (congestive heart  failure) (HCC) History of diastolic CHF.  Currently hypovolemic and compensated.  Watch closely while on IV fluids.  Last EF 55 to 60% in 2013.  Still appears dry Net IO Since Admission: 3,055.03 mL [11/18/21 1324]  BPH (benign prostatic hyperplasia)- (present on admission) Continues finasteride holding Flomax due to syncope  Prolonged QT interval- (present on admission) Monitor EKG  Leukocytosis- (present on admission) Likely reactive.  It has resolved. Recent Labs  Lab 11/16/21 1642 11/17/21 0220 11/18/21 0604  WBC 10.7* 8.1 6.2    Tardive dyskinesia At baseline for many years.  Supportive care fall precaution  PTSD (post-traumatic stress disorder)- (present on admission) Continue with Prozac.  Per wife patient has baseline unsteadiness/tremors of his extremities  DVT prophylaxis: SCDs Start: 11/16/21 1927 Code Status:   Code Status: Full Code Family Communication: plan of care discussed with patient/wife at bedside.  Disposition: Inpatient for ongoing IV fluid hydration. Anticipate discharge tomorrow-daughter requesting outpatient PT instead of home health  Objective: Vitals last 24 hrs: Vitals:   11/17/21 1843 11/17/21 2112 11/18/21 0448 11/18/21 1248  BP: (!) 164/61 (!) 165/77 (!) 172/63 (!) 158/57  Pulse: 65 63 63 70  Resp: 18 18 16 17   Temp: 98.2 F (36.8 C) 98.4 F (36.9 C) 98.6 F (37 C) 98.7 F (37.1 C)  TempSrc: Oral Oral Oral Oral  SpO2: 99% 97% 100% 100%  Weight:   69.5 kg   Height:       Weight change:   Physical Examination: General exam: AA0X3, older than stated age, weak appearing. HEENT:Oral mucosa dry, Ear/Nose WNL grossly, dentition normal. Respiratory system: bilaterally diminished, no use of accessory muscle Cardiovascular system: S1 & S2 +, No JVD,. Gastrointestinal system: Abdomen soft,NT,ND,BS+ Nervous System:Alert, awake, moving extremities and grossly nonfocal Extremities: LE ankle edema NONE, distal peripheral pulses palpable.   Skin: No rashes,no icterus. MSK: Normal muscle bulk,tone, power   Medications reviewed:  Scheduled Meds:  clopidogrel  75 mg Oral Daily   finasteride  5 mg Oral Daily   pantoprazole  40 mg Oral Daily   Continuous Infusions:  lactated ringers 75 mL/hr at 11/18/21 8341      Diet Order             Diet regular Room service appropriate? Yes; Fluid consistency: Thin  Diet effective now                    Intake/Output Summary (Last 24 hours) at 11/18/2021 1325 Last data filed at 11/18/2021 0900 Gross per 24 hour  Intake 3205.03 ml  Output 1150 ml  Net 2055.03 ml   Net IO Since Admission: 3,055.03 mL [11/18/21 1325]  Wt Readings from Last 3 Encounters:  11/18/21 69.5 kg  04/08/16 77 kg  02/15/16 77.1 kg     Unresulted Labs (From admission, onward)     Start     Ordered   11/18/21 9622  Basic metabolic panel  Daily,   R     Question:  Specimen collection method  Answer:  Lab=Lab collect   11/17/21 1116          Data Reviewed: I have personally reviewed following labs and imaging studies CBC: Recent Labs  Lab 11/16/21 1642 11/17/21 0220 11/18/21 0604  WBC  10.7* 8.1 6.2  NEUTROABS 8.8* 5.8  --   HGB 11.1* 9.9* 9.8*  HCT 33.8* 30.3* 30.0*  MCV 91.8 92.9 92.9  PLT 183 150 676*   Basic Metabolic Panel: Recent Labs  Lab 11/16/21 1642 11/17/21 0220 11/18/21 0604  NA 134* 134* 138  K 4.8 4.2 4.0  CL 104 108 109  CO2 20* 19* 21*  GLUCOSE 137* 87 93  BUN 50* 45* 36*  CREATININE 2.32* 2.09* 1.83*  CALCIUM 8.9 8.4* 8.3*  MG 2.3 2.2  --   PHOS  --  4.3  --    GFR: Estimated Creatinine Clearance: 33.2 mL/min (A) (by C-G formula based on SCr of 1.83 mg/dL (H)). Liver Function Tests: Recent Labs  Lab 11/16/21 1642 11/17/21 0220  AST 32 28  ALT 19 19  ALKPHOS 60 51  BILITOT 0.5 0.7  PROT 6.3* 5.7*  ALBUMIN 4.1 3.6   No results for input(s): LIPASE, AMYLASE in the last 168 hours. No results for input(s): AMMONIA in the last 168  hours. Coagulation Profile: No results for input(s): INR, PROTIME in the last 168 hours. Cardiac Enzymes: Recent Labs  Lab 11/16/21 1642 11/17/21 0220  CKTOTAL 1,832* 1,167*   Radiology Studies: CT ABDOMEN PELVIS WO CONTRAST  Result Date: 11/16/2021 CLINICAL DATA:  Fall EXAM: CT ABDOMEN AND PELVIS WITHOUT CONTRAST TECHNIQUE: Multidetector CT imaging of the abdomen and pelvis was performed following the standard protocol without IV contrast. RADIATION DOSE REDUCTION: This exam was performed according to the departmental dose-optimization program which includes automated exposure control, adjustment of the mA and/or kV according to patient size and/or use of iterative reconstruction technique. COMPARISON:  CT abdomen pelvis 02/14/2012 FINDINGS: Lower chest: No acute abnormality. Coronary calcification. Tiny hiatal hernia. Liver: Not enlarged. No focal lesion. No laceration or subcapsular hematoma. Biliary System: Status post cholecystectomy. No biliary ductal dilatation. Pancreas: Normal pancreatic contour. No main pancreatic duct dilatation. Spleen: Not enlarged. No focal lesion. No laceration, subcapsular hematoma, or vascular injury. Adrenal Glands: No nodularity bilaterally. Kidneys: Bilateral kidneys enhance symmetrically. There is a 5.7 x 5.4 cm fluid density lesion within the right kidney that likely represents a simple renal cyst. No hydronephrosis. No contusion, laceration, or subcapsular hematoma. No injury to the vascular structures or collecting systems. No hydroureter. The urinary bladder is unremarkable. Bowel: No small or large bowel wall thickening or dilatation. Few scattered colonic diverticula. The appendix is unremarkable. Mesentery, Omentum, and Peritoneum: No simple free fluid ascites. No pneumoperitoneum. No hemoperitoneum. No mesenteric hematoma identified. No organized fluid collection. Pelvic Organs: Normal. Lymph Nodes: No abdominal, pelvic, inguinal lymphadenopathy.  Vasculature: Severe atherosclerotic plaque. Interval increase in size of an infrarenal abdominal aorta aneurysm status post stent graft repair extending from just inferior to superior mesenteric artery down to bilateral common iliac arteries. The aneurysm measures up to 5.8 x 5.3 cm (from 3.8 x 3.5 cm in 2013 pre stenting) on axial imaging. The aneurysm measures approximately 8 cm in the craniocaudal dimension. No active contrast extravasation or pseudoaneurysm. Musculoskeletal: No significant soft tissue hematoma. No acute pelvic fracture. Please see separately dictated CT thoracolumbar spine 11/16/21. IMPRESSION: 1. No acute traumatic injury to the abdomen or pelvis with limited evaluation on this noncontrast study. 2. Please see separately dictated CT thoracolumbar spine 11/16/21. 3. Interval increase in size of a stented infrarenal abdominal aorta aneurysm measuring up to 5.8 x 5.3 cm. Recommend referral to a vascular specialist. This recommendation follows ACR consensus guidelines: White Paper of the ACR Incidental Findings Committee  II on Vascular Findings. J Am Coll Radiol 2013; 10:789-794. 4. Tiny hiatal hernia. 5. Colonic diverticulosis with no acute diverticulitis. 6. Aortic aneurysm NOS (ICD10-I71.9). Aortic Atherosclerosis (ICD10-I70.0). Electronically Signed   By: Iven Finn M.D.   On: 11/16/2021 18:48   CT Head Wo Contrast  Result Date: 11/16/2021 CLINICAL DATA:  Head trauma, minor (Age >= 65y); Neck trauma (Age >= 65y). Fall EXAM: CT HEAD WITHOUT CONTRAST CT CERVICAL SPINE WITHOUT CONTRAST TECHNIQUE: Multidetector CT imaging of the head and cervical spine was performed following the standard protocol without intravenous contrast. Multiplanar CT image reconstructions of the cervical spine were also generated. RADIATION DOSE REDUCTION: This exam was performed according to the departmental dose-optimization program which includes automated exposure control, adjustment of the mA and/or kV according  to patient size and/or use of iterative reconstruction technique. COMPARISON:  None. FINDINGS: CT HEAD FINDINGS BRAIN: BRAIN Limited evaluation due to artifact. Patchy and confluent areas of decreased attenuation are noted throughout the deep and periventricular white matter of the cerebral hemispheres bilaterally, compatible with chronic microvascular ischemic disease. No evidence of large-territorial acute infarction. No parenchymal hemorrhage. No mass lesion. No extra-axial collection. No mass effect or midline shift. No hydrocephalus. Basilar cisterns are patent. Vascular: No hyperdense vessel. Atherosclerotic calcifications are present within the cavernous internal carotid arteries. Skull: No acute fracture or focal lesion. Sinuses/Orbits: Paranasal sinuses and mastoid air cells are clear. The orbits are unremarkable. Other: None. CT CERVICAL SPINE FINDINGS Alignment: Normal. Skull base and vertebrae: Multilevel severe degenerative changes of the spine. No associated severe osseous neural foraminal or central canal stenosis. No acute fracture. No aggressive appearing focal osseous lesion or focal pathologic process. Soft tissues and spinal canal: No prevertebral fluid or swelling. No visible canal hematoma. Upper chest: Unremarkable. Other: Atherosclerotic plaque of the carotid arteries within the neck. IMPRESSION: 1. No acute intracranial abnormality with limited evaluation due to artifact. 2. No acute displaced fracture or traumatic listhesis of the cervical spine. Electronically Signed   By: Iven Finn M.D.   On: 11/16/2021 17:57   CT Cervical Spine Wo Contrast  Result Date: 11/16/2021 CLINICAL DATA:  Head trauma, minor (Age >= 65y); Neck trauma (Age >= 65y). Fall EXAM: CT HEAD WITHOUT CONTRAST CT CERVICAL SPINE WITHOUT CONTRAST TECHNIQUE: Multidetector CT imaging of the head and cervical spine was performed following the standard protocol without intravenous contrast. Multiplanar CT image  reconstructions of the cervical spine were also generated. RADIATION DOSE REDUCTION: This exam was performed according to the departmental dose-optimization program which includes automated exposure control, adjustment of the mA and/or kV according to patient size and/or use of iterative reconstruction technique. COMPARISON:  None. FINDINGS: CT HEAD FINDINGS BRAIN: BRAIN Limited evaluation due to artifact. Patchy and confluent areas of decreased attenuation are noted throughout the deep and periventricular white matter of the cerebral hemispheres bilaterally, compatible with chronic microvascular ischemic disease. No evidence of large-territorial acute infarction. No parenchymal hemorrhage. No mass lesion. No extra-axial collection. No mass effect or midline shift. No hydrocephalus. Basilar cisterns are patent. Vascular: No hyperdense vessel. Atherosclerotic calcifications are present within the cavernous internal carotid arteries. Skull: No acute fracture or focal lesion. Sinuses/Orbits: Paranasal sinuses and mastoid air cells are clear. The orbits are unremarkable. Other: None. CT CERVICAL SPINE FINDINGS Alignment: Normal. Skull base and vertebrae: Multilevel severe degenerative changes of the spine. No associated severe osseous neural foraminal or central canal stenosis. No acute fracture. No aggressive appearing focal osseous lesion or focal pathologic process. Soft tissues and  spinal canal: No prevertebral fluid or swelling. No visible canal hematoma. Upper chest: Unremarkable. Other: Atherosclerotic plaque of the carotid arteries within the neck. IMPRESSION: 1. No acute intracranial abnormality with limited evaluation due to artifact. 2. No acute displaced fracture or traumatic listhesis of the cervical spine. Electronically Signed   By: Iven Finn M.D.   On: 11/16/2021 17:57   CT Thoracic Spine Wo Contrast  Result Date: 11/16/2021 CLINICAL DATA:  Back trauma, no prior imaging (Age >= 16y). Status  post fall EXAM: CT THORACIC AND LUMBAR SPINE WITHOUT CONTRAST TECHNIQUE: Multidetector CT imaging of the thoracic and lumbar spine was performed without contrast. Multiplanar CT image reconstructions were also generated. RADIATION DOSE REDUCTION: This exam was performed according to the departmental dose-optimization program which includes automated exposure control, adjustment of the mA and/or kV according to patient size and/or use of iterative reconstruction technique. COMPARISON:  None. FINDINGS: CT THORACIC SPINE FINDINGS Alignment: Normal. Vertebrae: Multilevel contiguous moderate osteophyte formation. No acute fracture or focal pathologic process. Paraspinal and other soft tissues: Negative. Disc levels: Maintained. CT LUMBAR SPINE FINDINGS Segmentation: 5 lumbar type vertebrae. Alignment: Normal. Vertebrae: Multilevel moderate osteophyte formation. No acute fracture or focal pathologic process. Paraspinal and other soft tissues: Negative. Disc levels: Maintained. Visualized portions of the thorax: Right lower lobe subsegmental atelectasis. Atherosclerotic plaque of the aorta. Four-vessel coronary calcification. Sternotomy wires. Visualized portions of the abdomen and pelvis: Please see separately dictated CT abdomen pelvis 11/16/2021. IMPRESSION: CT THORACIC SPINE IMPRESSION No acute displaced fracture or traumatic listhesis of the thoracic spine. CT LUMBAR SPINE IMPRESSION No acute displaced fracture or traumatic listhesis of the lumbar spine. Please see separately dictated CT abdomen pelvis 11/16/2021. Aortic aneurysm NOS (ICD10-I71.9). Aortic Atherosclerosis (ICD10-I70.0). Electronically Signed   By: Iven Finn M.D.   On: 11/16/2021 18:51   CT L-SPINE NO CHARGE  Result Date: 11/16/2021 CLINICAL DATA:  Back trauma, no prior imaging (Age >= 16y). Status post fall EXAM: CT THORACIC AND LUMBAR SPINE WITHOUT CONTRAST TECHNIQUE: Multidetector CT imaging of the thoracic and lumbar spine was performed  without contrast. Multiplanar CT image reconstructions were also generated. RADIATION DOSE REDUCTION: This exam was performed according to the departmental dose-optimization program which includes automated exposure control, adjustment of the mA and/or kV according to patient size and/or use of iterative reconstruction technique. COMPARISON:  None. FINDINGS: CT THORACIC SPINE FINDINGS Alignment: Normal. Vertebrae: Multilevel contiguous moderate osteophyte formation. No acute fracture or focal pathologic process. Paraspinal and other soft tissues: Negative. Disc levels: Maintained. CT LUMBAR SPINE FINDINGS Segmentation: 5 lumbar type vertebrae. Alignment: Normal. Vertebrae: Multilevel moderate osteophyte formation. No acute fracture or focal pathologic process. Paraspinal and other soft tissues: Negative. Disc levels: Maintained. Visualized portions of the thorax: Right lower lobe subsegmental atelectasis. Atherosclerotic plaque of the aorta. Four-vessel coronary calcification. Sternotomy wires. Visualized portions of the abdomen and pelvis: Please see separately dictated CT abdomen pelvis 11/16/2021. IMPRESSION: CT THORACIC SPINE IMPRESSION No acute displaced fracture or traumatic listhesis of the thoracic spine. CT LUMBAR SPINE IMPRESSION No acute displaced fracture or traumatic listhesis of the lumbar spine. Please see separately dictated CT abdomen pelvis 11/16/2021. Aortic aneurysm NOS (ICD10-I71.9). Aortic Atherosclerosis (ICD10-I70.0). Electronically Signed   By: Iven Finn M.D.   On: 11/16/2021 18:51   DG Chest Port 1 View  Result Date: 11/16/2021 CLINICAL DATA:  Provided history: Weakness, syncope. Additional history provided: Fall, found on floor by caregiver. EXAM: PORTABLE CHEST 1 VIEW COMPARISON:  Prior chest radiographs 12/05/2012 and earlier. FINDINGS: Please note  a portion of the left lateral costophrenic angle is excluded from the field of view. Prior median sternotomy/CABG. Heart size  within normal limits. No appreciable airspace consolidation or pulmonary edema. No evidence of pleural effusion or pneumothorax. No acute bony abnormality identified. IMPRESSION: A portion of the left lateral costophrenic angle is excluded from the field of view. No evidence of acute cardiopulmonary abnormality. Electronically Signed   By: Kellie Simmering D.O.   On: 11/16/2021 16:46     LOS: 1 day   [name], MD [facility]  11/18/2021, 1:25 PM

## 2021-11-18 NOTE — Assessment & Plan Note (Addendum)
Blood pressure labile variable baseline orthostatic.  Has been dealing with this for a number of years.  Added compression stocking.  Symptoms may have been worsened by dehydration as evident by AKI-renal function improved with IV fluids.  Encourage oral intake, gentle hydration.Counseled and encouraged compression stocking and ordered.  He reports he will use it. Continue routine fall precaution instruction environment at home and he will follow-up with PCP.Blood pressure does vary at times running very high-not a candidate for midodrine due to risk of worsening supine hypertension.

## 2021-11-18 NOTE — Discharge Instructions (Signed)
IBS Nutrition Therapy  This nutrition therapy can help you feel less constipated and bloated. It can also help ease diarrhea. It is important to make changes to your eating plan gradually. Keep a list of the changes that help you feel better and what makes you feel worse.  Tips Eat meals and snacks on a regular schedule. Aim for 5 or 6 small meals/snacks each day. Do not skip meals. Increase fiber in your eating plan, but do so gradually. To get fiber, choose whole grains (such as whole wheat, bran, oats, brown rice, wild rice), seeds, nuts, and fresh fruits and vegetables. To reduce fiber intake: Choose grains with refined white flour products (cream of wheat, fine-ground grits, white bread, white pasta, white rice, cold cereals made from white flour). Check labels for less than 2 grams of fiber per serving. Cook vegetables and use canned fruits rather than fresh fruits and vegetables. Aim to consume 6 to 8 cups of water in addition to your other beverages. A low-fat diet is often better tolerated. You may feel better if you avoid fried foods and foods prepared with added fat. Limit acidic, spicy, fried, or greasy foods, caffeine, and mint if you have symptoms of reflux (heartburn) or have been diagnosed with GERD (gastroesophageal reflux disease).  Dairy    Buttermilk* Evaporated, fat-free, and low-fat milk* Soy milk, almond milk, or rice milk Yogurt* with live active cultures and added probiotics Powdered milk* Cheese,* aged cheeses may do better: cheddar, Parmesan, Provolone, or Swiss Low-fat ice cream*  If you have lactose intolerance, consuming milk products may aggravate symptoms. Foods marked with asterisk (*) contain lactose.  Lactose label reading guidelines: Avoid foods made with milk, milk solids, whey, cream, butter, or if product list states may contain milk.  Grains  All ready-to-eat or cooked grain foods*    Protein Foods  Tender, well-cooked meat, poultry, fish,  eggs, or soy foods made without added fat  Avoid nut butters are higher in fat.  Vegetables  You may eat any vegetables that your body tolerates. For example: green beans, carrots, butternut squash, or spinach. Be aware that some vegetables may cause gas.  See the Foods Not Recommended chart for vegetables to avoid.  Limit vegetables to  cup serving initially to reduce symptoms.  Fruits  All fruits and fruit juices except prune juice, apple juice, and grape juice. For example: banana, peach, plum, or pineapple.  See the Foods Not Recommended chart for fruits to avoid. (More than 2 cups of fruit juice may not be tolerated due to fructose content.)  Limit to  cup serving or 1 piece of fruit per meal initially to reduce symptoms.  Fats and Oils  Fats include oil, butter,* cream,* cream cheese,* margarine, and mayonnaise.  Limit fats to less than 8 teaspoons a day.  Beverages  Decaffeinated coffee Caffeine-free teas Rehydration beverages  Healthy people need 8 to 10 cups of fluid each day. You may need to drink more to replace fluids lost to diarrhea.  From Academy of Nutrition [employer] Dietetics

## 2021-11-19 DIAGNOSIS — E440 Moderate protein-calorie malnutrition: Secondary | ICD-10-CM | POA: Insufficient documentation

## 2021-11-19 LAB — BASIC METABOLIC PANEL
Anion gap: 5 (ref 5–15)
BUN: 29 mg/dL — ABNORMAL HIGH (ref 8–23)
CO2: 24 mmol/L (ref 22–32)
Calcium: 8.3 mg/dL — ABNORMAL LOW (ref 8.9–10.3)
Chloride: 109 mmol/L (ref 98–111)
Creatinine, Ser: 1.65 mg/dL — ABNORMAL HIGH (ref 0.61–1.24)
GFR, Estimated: 43 mL/min — ABNORMAL LOW (ref >60)
Glucose, Bld: 102 mg/dL — ABNORMAL HIGH (ref 70–99)
Potassium: 3.8 mmol/L (ref 3.5–5.1)
Sodium: 138 mmol/L (ref 135–145)

## 2021-11-19 LAB — CK: Total CK: 170 U/L (ref 49–397)

## 2021-11-19 MED ORDER — TRAZODONE HCL 50 MG PO TABS
50.0000 mg | ORAL_TABLET | Freq: Once | ORAL | Status: AC
  Administered 2021-11-19: 50 mg via ORAL
  Filled 2021-11-19: qty 1

## 2021-11-19 MED ORDER — ENOXAPARIN SODIUM 30 MG/0.3ML IJ SOSY
30.0000 mg | PREFILLED_SYRINGE | INTRAMUSCULAR | Status: DC
  Administered 2021-11-19: 30 mg via SUBCUTANEOUS
  Filled 2021-11-19: qty 0.3

## 2021-11-19 NOTE — Assessment & Plan Note (Addendum)
Augmentin diet as below Nutrition Status: Nutrition Problem: Moderate Malnutrition Etiology: chronic illness (CHF, h/o CVA, chronic diarrhea) Signs/Symptoms: percent weight loss, severe muscle depletion, moderate fat depletion Interventions: Ensure Enlive (each supplement provides 350kcal and 20 grams of protein), MVI, Education

## 2021-11-19 NOTE — NC FL2 (Signed)
Blue LEVEL OF CARE SCREENING TOOL     IDENTIFICATION  Patient Name: [name] Birthdate: 1946-08-24 Sex: male Admission Date (Current Location): 11/16/2021  [facility] and [place] Number:  Herbalist and Address:  [facility],  [address], [place]      Provider Number: [id number]  Attending Physician Name and Address:  [name], MD  Relative Name and Phone Number:       Current Level of Care: Hospital Recommended Level of Care: [place] Prior Approval Number:    Date Approved/Denied:   PASRR Number: Pending  Discharge Plan: SNF    Current Diagnoses: Patient Active Problem List   Diagnosis Date Noted   Malnutrition of moderate degree 11/19/2021   Autonomic orthostatic hypotension 11/18/2021   Restless leg 11/18/2021   Chronic diastolic CHF (congestive heart failure) ([place]) 28/31/5176   Metabolic acidosis 16/04/3709   Anemia due to chronic kidney disease 11/17/2021   Acute renal failure superimposed on stage 3a chronic kidney disease ([place]) 11/16/2021   Elevated CPK 11/16/2021   Syncope 11/16/2021   Nausea & vomiting 11/16/2021   Leukocytosis 11/16/2021   Prolonged QT interval 11/16/2021   BPH (benign prostatic hyperplasia)    AAA (abdominal aortic aneurysm) without rupture 07/31/2014   Diabetes mellitus ([place]) 06/06/2012   CTS (carpal tunnel syndrome) 06/02/2012   Encephalopathy acute 05/24/2012   Hemorrhoids, internal, with bleeding 02/15/2012   IBS (irritable bowel syndrome) with episodic urgent diarrhea 02/15/2012   Tardive dyskinesia 12/09/2011   History of CVA (cerebrovascular accident) 12/09/2011   Seizure disorder ([place]) 12/08/2011   CAD (coronary artery disease) 01/27/2011   Carotid stenosis    CVA 10/28/2008   DIABETES MELLITUS, TYPE II 05/17/2008   PTSD (post-traumatic stress disorder) 02/24/2008   FATIGUE 02/24/2008   GAIT IMBALANCE 02/24/2008   COLONIC POLYPS, HX OF  02/24/2008   HLD (hyperlipidemia) 06/22/2007   THROMBOCYTOPENIA NOS 06/22/2007   HYPERTENSION 06/22/2007   ANEURYSM, ABDOMINAL AORTIC 06/22/2007   GERD 06/22/2007   FATTY LIVER DISEASE 06/22/2007   PSA, INCREASED 06/22/2007   INJURY NOS, HEAD 06/22/2007   Personal history of unspecified circulatory disease 06/22/2007   HX, URINARY INFECTION 06/22/2007    Orientation RESPIRATION BLADDER Height & Weight     Self, Time, Situation, Place  Normal Continent Weight: 69.5 kg Height:  5\' 9"  (175.3 cm)  BEHAVIORAL SYMPTOMS/MOOD NEUROLOGICAL BOWEL NUTRITION STATUS     (Tardive dyskinesia) Continent Diet (Regular)  AMBULATORY STATUS COMMUNICATION OF NEEDS Skin   Extensive Assist Verbally Skin abrasions                       Personal Care Assistance Level of Assistance  Bathing, Feeding, Dressing Bathing Assistance: Limited assistance Feeding assistance: Independent Dressing Assistance: Limited assistance     Functional Limitations Info  Sight, Hearing, Speech Sight Info: Adequate Hearing Info: Adequate Speech Info: Adequate    SPECIAL CARE FACTORS FREQUENCY  PT (By licensed PT), OT (By licensed OT)     PT Frequency: 5 x weekly OT Frequency: 5 x weekly            Contractures Contractures Info: Not present    Additional Factors Info  Code Status, Allergies Code Status Info: Full Allergies Info: None Known           Current Medications (11/19/2021):  This is the current hospital active medication list Current Facility-Administered Medications  Medication Dose Route Frequency Provider Last Rate Last  Admin   acetaminophen (TYLENOL) tablet 650 mg  650 mg Oral Q6H PRN Howerter, Justin B, DO       Or   acetaminophen (TYLENOL) suppository 650 mg  650 mg Rectal Q6H PRN Howerter, Justin B, DO       clopidogrel (PLAVIX) tablet 75 mg  75 mg Oral Daily Kc, Ramesh, MD   75 mg at 11/19/21 1024   enoxaparin (LOVENOX) injection 30 mg  30 mg Subcutaneous Q24H Kc, Ramesh, MD        feeding supplement (ENSURE ENLIVE / ENSURE PLUS) liquid 237 mL  237 mL Oral BID BM Kc, Ramesh, MD   237 mL at 11/19/21 1025   finasteride (PROSCAR) tablet 5 mg  5 mg Oral Daily Kc, Ramesh, MD   5 mg at 11/19/21 1024   lactated ringers infusion   Intravenous Continuous Kc, Ramesh, MD 75 mL/hr at 11/19/21 0640 Infusion Verify at 11/19/21 0640   LORazepam (ATIVAN) injection 0.5 mg  0.5 mg Intravenous Q6H PRN Howerter, Justin B, DO       multivitamin with minerals tablet 1 tablet  1 tablet Oral Daily Kc, Ramesh, MD   1 tablet at 11/19/21 1024   pantoprazole (PROTONIX) EC tablet 40 mg  40 mg Oral Daily Howerter, Justin B, DO   40 mg at 11/19/21 1024     Discharge Medications: Please see discharge summary for a list of discharge medications.  Relevant Imaging Results:  Relevant Lab Results:   Additional Information SS# [id number]  Neylan Koroma, Marjie Skiff, RN

## 2021-11-19 NOTE — Progress Notes (Signed)
Transition of Care (TOC) -30 day Note       Patient Details  Name: [name] MRN: [id number] Date of [id number]     MUST ID:    To Whom it May Concern:   Please be advised that the above patient will require a short-term nursing home stay, anticipated 30 days or less rehabilitation and strengthening. The plan is for return home.

## 2021-11-19 NOTE — TOC Initial Note (Signed)
Transition of Care [facility]) - Initial/Assessment Note    Patient Details  Name: [name] MRN: [id number] Date of Birth: [birth year]  [facility]) CM/SW Contact:    Lynnell Catalan, RN Phone Number: 11/19/2021, 1:27 PM  Clinical Narrative:                  Spoke with pt and wife at bedside for dc planning. Physical therapy recommendations reviewed. Pt and wife agree that they would like pt to go to rehab prior to going home. They tell me that he only has [employer].   Spoke with Juliann Pulse at the [place] in [place] who informs me that pt is 0% connected with the [place]. He needs to be 70% connected to be eligible for rehab benefits. She does say that pt has Medicare part A.  Spoke with wife again who reports that pt does have Part A medicare and she has the card at home. She was encouraged to get the card at home and bring it to our admitting office so we can use it to place him in SNF. Juliann Pulse agrees she will do this today. TOC will continue to follow.      Activities of Daily Living Home Assistive Devices/Equipment: None ADL Screening (condition at time of admission) Patient's cognitive ability adequate to safely complete daily activities?: Yes Is the patient deaf or have difficulty hearing?: No Does the patient have difficulty seeing, even when wearing glasses/contacts?: No Does the patient have difficulty concentrating, remembering, or making decisions?: No Patient able to express need for assistance with ADLs?: Yes Does the patient have difficulty dressing or bathing?: No Independently performs ADLs?: Yes (appropriate for developmental age) Does the patient have difficulty walking or climbing stairs?: No Weakness of Legs: Both Weakness of Arms/Hands: Both       Admission diagnosis:  AKI (acute kidney injury) ([place]) [N17.9] Multiple falls [R29.6] Syncope, unspecified syncope type [R55] Patient Active Problem List   Diagnosis Date Noted   Malnutrition of moderate degree  11/19/2021   Autonomic orthostatic hypotension 11/18/2021   Restless leg 11/18/2021   Chronic diastolic CHF (congestive heart failure) ([place]) 96/22/2979   Metabolic acidosis 89/21/1941   Anemia due to chronic kidney disease 11/17/2021   Acute renal failure superimposed on stage 3a chronic kidney disease ([place]) 11/16/2021   Elevated CPK 11/16/2021   Syncope 11/16/2021   Nausea & vomiting 11/16/2021   Leukocytosis 11/16/2021   Prolonged QT interval 11/16/2021   BPH (benign prostatic hyperplasia)    AAA (abdominal aortic aneurysm) without rupture 07/31/2014   Diabetes mellitus ([place]) 06/06/2012   CTS (carpal tunnel syndrome) 06/02/2012   Encephalopathy acute 05/24/2012   Hemorrhoids, internal, with bleeding 02/15/2012   IBS (irritable bowel syndrome) with episodic urgent diarrhea 02/15/2012   Tardive dyskinesia 12/09/2011   History of CVA (cerebrovascular accident) 12/09/2011   Seizure disorder ([place]) 12/08/2011   CAD (coronary artery disease) 01/27/2011   Carotid stenosis    CVA 10/28/2008   DIABETES MELLITUS, TYPE II 05/17/2008   PTSD (post-traumatic stress disorder) 02/24/2008   FATIGUE 02/24/2008   GAIT IMBALANCE 02/24/2008   COLONIC POLYPS, HX OF 02/24/2008   HLD (hyperlipidemia) 06/22/2007   THROMBOCYTOPENIA NOS 06/22/2007   HYPERTENSION 06/22/2007   ANEURYSM, ABDOMINAL AORTIC 06/22/2007   GERD 06/22/2007   FATTY LIVER DISEASE 06/22/2007   PSA, INCREASED 06/22/2007   INJURY NOS, HEAD 06/22/2007   Personal history of unspecified circulatory disease 06/22/2007   HX, URINARY INFECTION 06/22/2007   PCP:  [facility] [place]:   [facility] [place], [place] - [address] [place] [address] Phone: [phone] Fax: [phone]     Social Determinants of Health (SDOH) Interventions    Readmission Risk Interventions No flowsheet data found.

## 2021-11-19 NOTE — Progress Notes (Addendum)
PROGRESS NOTE [name]  WJX:[id number] DOB: [birth year] DOA: 11/16/2021 PCP: [facility]   Brief Narrative/Hospital Course: SERJIO Franklin, 76 y.o. male 76 y.o. male with medical history significant for chronic diarrhea, chronic diastolic heart failure, essential pretension, AAA status post stent, CAD status post CABG x3 in March 2012, CKD 3A associated baseline creatinine 1.4-1.5 presented to the ED with complaint of fal at home after he stood up became dizzy lightheaded and subsequently lost consciousness falling to the floor  2/12 night, and was also complaining of very little oral intake x72 hours due to his intractable nausea vomiting that he started after eating dinner at [employer] on Friday. He was seen in the ED underwent extensive imaging studies chest x-ray, CT head, CT C spine, CT abdomen pelvis no acute fracture or finding except for abdominal aortic aneurysm 5.8 X5.3 no dissection or pseudoaneurysm. He was found to have AKI, rhabdomyolysis leukocytosis and and admitted for further work-up of his syncope.   Subjective: Seen this morning patient on the bed wife at the bedside he has not ambulated today. PT has advised skilled nursing facility  Assessment and Plan: * Acute renal failure superimposed on stage 3a chronic kidney disease ([place])- (present on admission) Baseline creatinine 1.3-1.5. AKI 2/2 patient's poor oral intake with recent nausea vomiting.  Creatinine improved to 1.6 which is close to baseline.  Encourage oral hydration this was extensively discussed.  We will wean off IV fluids, encourage continued ambulation. Recent Labs  Lab 11/16/21 1642 11/17/21 0220 11/18/21 0604 11/19/21 0501  BUN 50* 45* 36* 29*  CREATININE 2.32* 2.09* 1.83* 1.65*    Autonomic orthostatic hypotension 179/63>140/114> 112 on 2/15-as per family and daughter has baseline orthostatic hypotension.  Counseled and encouraged compression stocking and ordered.  He reports  he will use it.  Continue routine fall precaution instruction environment at home and he will follow-up with PCP.  Blood pressure does vary at times running very high-not a candidate for midodrine due to risk of worsening supine hypertension.   Syncope- (present on admission) Orthostatic syncope, in the setting of chronic orthostatic hypotension worsened by his dehydration AKI.  Extensive imaging in the ED with CTA chest x-ray CT C-spine CT T-spine CT abdomen pelvis.  PT OT eval fall precaution.  Patient will continue with compression stocking fall precaution.  PT has advised skilled nursing facility and family interested to pursue  Metabolic acidosis Likely in the setting of AKI on CKD.  Improved to 24.  Elevated CPK- (present on admission) Mild rhabdomyolysis,after prolonged downtime at home.  Gentle IV fluid hydration, CK downtrending.  Outpatient follow-up  Nausea & vomiting- (present on admission) Resolved.  Tolerating diet.  CT abdomen pelvis no acute finding in GI system  HLD (hyperlipidemia)- (present on admission) Hold statin due to rhabdomyolysis.  Resume upon discharge  CAD (coronary artery disease)- (present on admission) With prior CABG.  Continue Plavix  AAA (abdominal aortic aneurysm) without rupture- (present on admission) Patient had stent placed back in November 2022 and scant following his initial procedure by [facility] showed some leakage and being monitored by his vascular surgery at [facility]  No abdominal discomfort he follows up with [facility]-he had done in Nov 2022, and wife says he has had leakage. It was 5.6  Or 5.8 cm.  Confirmed with patient's daughter, NP as well, he [facility] with [facility].  History of CVA (cerebrovascular accident) Stable at this time  grossly nonfocal with good strength in upper and lower extremities bilaterally.  Continue his Plavix .  Malnutrition of moderate degree Augmentin diet seen by dietitian Nutrition  Status: Nutrition Problem: Moderate Malnutrition Etiology: chronic illness (CHF, h/o CVA, chronic diarrhea) Signs/Symptoms: percent weight loss, severe muscle depletion, moderate fat depletion Interventions: Ensure Enlive (each supplement provides 350kcal and 20 grams of protein), MVI, Education    Anemia due to chronic kidney disease Mild anemia noted.  Likely in the setting of CKD.  Follow-up with PCP for routine work-up [facility]  Recent Labs  Lab 11/16/21 1642 11/17/21 0220 11/18/21 0604  HGB 11.1* 9.9* 9.8*  HCT 33.8* 30.3* 30.0*    Chronic diastolic CHF (congestive heart failure) (HCC) History of diastolic CHF.  Currently hypovolemic and compensated.  Watch closely while on IV fluids.  Last EF 55 to 60% in 2013.  Still appears dry Net IO Since Admission: 4,033.91 mL [11/19/21 0804]  BPH (benign prostatic hyperplasia)- (present on admission) Continues finasteride holding Flomax due to orthosatics hypotension-advised to discuss with PCP.  Prolonged QT interval- (present on admission) Monitor EKG  Leukocytosis- (present on admission) Likely reactive.  It has resolved. Recent Labs  Lab 11/16/21 1642 11/17/21 0220 11/18/21 0604  WBC 10.7* 8.1 6.2    Tardive dyskinesia At baseline for many years.  Supportive care fall precaution  PTSD (post-traumatic stress disorder)- (present on admission) Continue with Prozac.  Per wife patient has baseline unsteadiness/tremors of his extremities  DVT prophylaxis: high risk for vte 2/2 mostly being on bed- starting enoxaparin (LOVENOX) injection 30 mg Start: 11/19/21 1530 SCDs Start: 11/16/21 1927 Code Status:   Code Status: Full Code Family Communication: plan of care discussed with patient/wife at bedside.  Disposition: Inpatient for ongoing IV fluid hydration. PT has advised skilled nursing facility and family interested to pursue Awaiting for placement  Objective: Vitals last 24 hrs: Vitals:   11/19/21 1134 11/19/21 1140  11/19/21 1147 11/19/21 1346  BP: (!) 182/64 (!) 149/79 (!) 116/57 117/69  Pulse: 64 68 86 86  Resp: 16 18 20 20   Temp: 98.5 F (36.9 C)     TempSrc: Oral     SpO2: 98% 98% 97% 99%  Weight:      Height:       Weight change:   Physical Examination: General exam: AA,0x3, pleasant, concerned movement of tardive dyskinesia older than stated age, weak appearing. HEENT:Oral mucosa moist, Ear/Nose WNL grossly, dentition normal. Respiratory system: bilaterally diminished, no use of accessory muscle Cardiovascular system: S1 & S2 +, No JVD,. Gastrointestinal system: Abdomen soft,NT,ND,BS+ Nervous System:Alert, awake, moving extremities and grossly nonfocal Extremities: LE ankle edema none, distal peripheral pulses palpable.  Skin: No rashes,no icterus. MSK: Normal muscle bulk,tone, power   Medications reviewed:  Scheduled Meds:  clopidogrel  75 mg Oral Daily   enoxaparin (LOVENOX) injection  30 mg Subcutaneous Q24H   feeding supplement  237 mL Oral BID BM   finasteride  5 mg Oral Daily   multivitamin with minerals  1 tablet Oral Daily   pantoprazole  40 mg Oral Daily   Continuous Infusions:  lactated ringers 75 mL/hr at 11/19/21 0640      Diet Order             Diet regular Room service appropriate? Yes; Fluid consistency: Thin  Diet effective now                    Intake/Output Summary (Last 24 hours) at 11/19/2021 1433 Last data  filed at 11/19/2021 0640 Gross per 24 hour  Intake 1978.88 ml  Output 1000 ml  Net 978.88 ml   Net IO Since Admission: 4,033.91 mL [11/19/21 1433]  Wt Readings from Last 3 Encounters:  11/18/21 69.5 kg  04/08/16 77 kg  02/15/16 77.1 kg     Unresulted Labs (From admission, onward)     Start     Ordered   11/18/21 6659  Basic metabolic panel  Daily,   R     Question:  Specimen collection method  Answer:  Lab=Lab collect   11/17/21 1116          Data Reviewed: I have personally reviewed following labs and imaging  studies CBC: Recent Labs  Lab 11/16/21 1642 11/17/21 0220 11/18/21 0604  WBC 10.7* 8.1 6.2  NEUTROABS 8.8* 5.8  --   HGB 11.1* 9.9* 9.8*  HCT 33.8* 30.3* 30.0*  MCV 91.8 92.9 92.9  PLT 183 150 935*   Basic Metabolic Panel: Recent Labs  Lab 11/16/21 1642 11/17/21 0220 11/18/21 0604 11/19/21 0501  NA 134* 134* 138 138  K 4.8 4.2 4.0 3.8  CL 104 108 109 109  CO2 20* 19* 21* 24  GLUCOSE 137* 87 93 102*  BUN 50* 45* 36* 29*  CREATININE 2.32* 2.09* 1.83* 1.65*  CALCIUM 8.9 8.4* 8.3* 8.3*  MG 2.3 2.2  --   --   PHOS  --  4.3  --   --    GFR: Estimated Creatinine Clearance: 36.9 mL/min (A) (by C-G formula based on SCr of 1.65 mg/dL (H)). Liver Function Tests: Recent Labs  Lab 11/16/21 1642 11/17/21 0220  AST 32 28  ALT 19 19  ALKPHOS 60 51  BILITOT 0.5 0.7  PROT 6.3* 5.7*  ALBUMIN 4.1 3.6   No results for input(s): LIPASE, AMYLASE in the last 168 hours. No results for input(s): AMMONIA in the last 168 hours. Coagulation Profile: No results for input(s): INR, PROTIME in the last 168 hours. Cardiac Enzymes: Recent Labs  Lab 11/16/21 1642 11/17/21 0220 11/19/21 0501  CKTOTAL 1,832* 1,167* 170   Radiology Studies: No results found.   LOS: 2 days   [name], MD [facility]  11/19/2021, 2:33 PM

## 2021-11-20 DIAGNOSIS — R5381 Other malaise: Secondary | ICD-10-CM

## 2021-11-20 LAB — BASIC METABOLIC PANEL
Anion gap: 5 (ref 5–15)
BUN: 26 mg/dL — ABNORMAL HIGH (ref 8–23)
CO2: 25 mmol/L (ref 22–32)
Calcium: 8.5 mg/dL — ABNORMAL LOW (ref 8.9–10.3)
Chloride: 108 mmol/L (ref 98–111)
Creatinine, Ser: 1.64 mg/dL — ABNORMAL HIGH (ref 0.61–1.24)
GFR, Estimated: 43 mL/min — ABNORMAL LOW (ref >60)
Glucose, Bld: 109 mg/dL — ABNORMAL HIGH (ref 70–99)
Potassium: 4.2 mmol/L (ref 3.5–5.1)
Sodium: 138 mmol/L (ref 135–145)

## 2021-11-20 MED ORDER — DOXYLAMINE SUCCINATE (SLEEP) 25 MG PO TABS
25.0000 mg | ORAL_TABLET | Freq: Every evening | ORAL | Status: DC | PRN
  Filled 2021-11-20: qty 1

## 2021-11-20 MED ORDER — GABAPENTIN 300 MG PO CAPS
300.0000 mg | ORAL_CAPSULE | Freq: Two times a day (BID) | ORAL | Status: DC
  Administered 2021-11-20: 300 mg via ORAL
  Filled 2021-11-20: qty 1

## 2021-11-20 MED ORDER — ATORVASTATIN CALCIUM 20 MG PO TABS: 20.0000 mg | ORAL_TABLET | Freq: Every day | ORAL | Status: DC

## 2021-11-20 MED ORDER — CLONIDINE HCL 0.1 MG PO TABS: 0.1000 mg | ORAL_TABLET | Freq: Three times a day (TID) | ORAL | 11 refills | Status: DC

## 2021-11-20 MED ORDER — PANTOPRAZOLE SODIUM 40 MG PO TBEC: 40.0000 mg | DELAYED_RELEASE_TABLET | Freq: Every day | ORAL | 2 refills | Status: DC

## 2021-11-20 MED ORDER — RIVASTIGMINE 9.5 MG/24HR TD PT24
9.5000 mg | MEDICATED_PATCH | Freq: Every day | TRANSDERMAL | Status: DC
  Administered 2021-11-20: 9.5 mg via TRANSDERMAL
  Filled 2021-11-20 (×2): qty 1

## 2021-11-20 MED ORDER — FLUOXETINE HCL 20 MG PO CAPS
40.0000 mg | ORAL_CAPSULE | Freq: Every day | ORAL | Status: DC
  Administered 2021-11-20: 40 mg via ORAL
  Filled 2021-11-20: qty 2

## 2021-11-20 MED ORDER — LOPERAMIDE HCL 2 MG PO CAPS: 2.0000 mg | ORAL_CAPSULE | Freq: Four times a day (QID) | ORAL | Status: DC | PRN

## 2021-11-20 MED ORDER — CLONIDINE HCL 0.1 MG PO TABS
0.1000 mg | ORAL_TABLET | Freq: Three times a day (TID) | ORAL | Status: DC
  Administered 2021-11-20: 0.1 mg via ORAL
  Filled 2021-11-20: qty 1

## 2021-11-20 NOTE — Progress Notes (Signed)
Patient discharged to SNF. Patient left via [facility]. AVS and IV's removed. Discharge packet given to patient and [facility] staff. Vital signs taken prior to leaving. Patient was dressed and ambulated to stretcher, tolerated well. Escorted out by [facility].

## 2021-11-20 NOTE — TOC Transition Note (Addendum)
Transition of Care [facility]) - CM/SW Discharge Note   Patient Details  Name: [name] MRN: [id number] Date of Birth: [birth year]  [facility]) CM/SW Contact:  Lynnell Catalan, RN Phone Number: 11/20/2021, 1:10 PM   Clinical Narrative:    [facility] received [phone] [id number]  SNF bed offers provided to pt and husband at bedside. [employer]. [employer] alerted of bed acceptance. [facility] will be contacted for transport to SNF. RN to call report to [phone].

## 2021-11-20 NOTE — TOC Progression Note (Signed)
Transition of Care [facility]) - Progression Note    Patient Details  Name: [name] MRN: [id number] Date of Birth: June 16, 1946  [facility]) CM/SW Contact  Eyoel Throgmorton, Marjie Skiff, RN Phone Number: 11/20/2021, 10:15 AM  Clinical Narrative:    Medicare A listed now as additional insurance. Pt faxed out to area SNFs. [facility] is asking for additional clinicals to complete [facility] number. Clinicals sent. TOC will continue to follow.       Readmission Risk Interventions No flowsheet data found.

## 2021-11-20 NOTE — Assessment & Plan Note (Signed)
Continue PT OT.  Planning for skilled facility, waiting for placement

## 2021-11-20 NOTE — Assessment & Plan Note (Signed)
Blood pressure fluctuates.  Patient is on clonidine resume with holding parameters as it is high today.

## 2021-11-20 NOTE — Plan of Care (Signed)
Report called to [place]  Ethlyn Daniels, RN, BSN

## 2021-11-20 NOTE — Discharge Summary (Signed)
Physician Discharge Summary   Patient: [name] MRN: [id number] DOB: May 26, 1946  Admit date:     11/16/2021  Discharge date: 11/20/21  Discharge Physician: [name]   PCP: [place]   Recommendations at discharge:   Follow-up with PCP 1 wk Follow-up with vascular surgery regarding the AAA s/p stents and w/ leak   Hospital Course: 76 y.o. male with medical history significant for chronic diarrhea, chronic diastolic heart failure, essential pretension, AAA status post stent, CAD status post CABG x3 in March 2012, CKD 3A associated baseline creatinine 1.4-1.5 presented to the ED with complaint of fal at home after he stood up became dizzy lightheaded and subsequently lost consciousness falling to the floor  2/12 night, and was also complaining of very little oral intake x72 hours due to his intractable nausea vomiting that he started after eating dinner at [employer] on Friday. He was seen in the ED underwent extensive imaging studies chest x-ray, CT head, CT C spine, CT abdomen pelvis no acute fracture or finding except for abdominal aortic aneurysm 5.8 X5.3 no dissection or pseudoaneurysm. He was found to have AKI, rhabdomyolysis leukocytosis and and admitted for further work-up of his syncope.  Was hydrated with IV fluids, AKI has resolved resume function at baseline, he does have baseline orthostatic labile hypertension for years.  He will continue with compression stocking subjective effective can try abdominal binder.  PT OT has recommended short-term skilled nursing facility and is being discharged today  Discharge Diagnoses: Principal Problem:   Acute renal failure superimposed on stage 3a chronic kidney disease ([place]) Active Problems:   Syncope   Autonomic orthostatic hypotension   Metabolic acidosis   Elevated CPK   Nausea & vomiting   HLD (hyperlipidemia)   CAD (coronary artery disease)   AAA (abdominal aortic aneurysm) without rupture   History of  CVA (cerebrovascular accident)   PTSD (post-traumatic stress disorder)   Tardive dyskinesia   Leukocytosis   Prolonged QT interval   BPH (benign prostatic hyperplasia)   Chronic diastolic CHF (congestive heart failure) (HCC)   Anemia due to chronic kidney disease   Restless leg   Malnutrition of moderate degree   Physical deconditioning  Resolved Problems:   AKI (acute kidney injury) ([place]) * Acute renal failure superimposed on stage 3a chronic kidney disease ([place])- (present on admission) Baseline creatinine 1.3-1.5 w/ CKD. Admitted w/ AKI due to patient's poor oral intake with recent nausea vomiting.  AKI improved to 1.6, stable, continue gentle IV fluids.  Encourage oral intake.  Per wife he does not like to drink liquids at all, I counseled extensively Recent Labs  Lab 11/16/21 1642 11/17/21 0220 11/18/21 0604 11/19/21 0501 11/20/21 0522  BUN 50* 45* 36* 29* 26*  CREATININE 2.32* 2.09* 1.83* 1.65* 1.64*    Autonomic orthostatic hypotension Blood pressure labile variable baseline orthostatic.  Has been dealing with this for a number of years.  Added compression stocking.  Symptoms may have been worsened by dehydration as evident by AKI-renal function improved with IV fluids.  Encourage oral intake, gentle hydration.Counseled and encouraged compression stocking and ordered.  He reports he will use it. Continue routine fall precaution instruction environment at home and he will follow-up with PCP.Blood pressure does vary at times running very high-not a candidate for midodrine due to risk of worsening supine hypertension.   Syncope- (present on admission) Orthostatic syncope, in the setting of chronic orthostatic hypotension worsened by his dehydration AKI.  ATN asymptomatic at  this time although blood pressure fluctuation diuresis.  Continue PT OT plan for skilled nursing facility.  Extensive imaging in the ED with CTA chest x-ray CT C-spine CT T-spine CT abdomen pelvis.   Metabolic  acidosis Resolved.Likely in the setting of AKI on CKD.  Elevated CPK- (present on admission) Mild rhabdomyolysis,after prolonged downtime at home.  Improved.  Nausea & vomiting- (present on admission) Resolved.Tolerating diet.  CT abdomen pelvis no acute finding in GI system  HLD (hyperlipidemia)- (present on admission) Hold statin due to rhabdomyolysis.  Resume upon discharge  CAD (coronary artery disease)- (present on admission) With prior CABG.  Continue Plavix  AAA (abdominal aortic aneurysm) without rupture- (present on admission) Patient had stent placed back in November 2022 and scant following his initial procedure by [facility] showed some leakage and being monitored by [facility]  No abdominal discomfort he follows up with [facility]-he had done in Nov 2022, and wife says he has had leakage. It was 5.6  Or 5.8 cm.  Confirmed with patient's daughter, NP as well, [facility] is [facility] with [facility] CT scans.  History of CVA (cerebrovascular accident) Stable at this time grossly nonfocal with good strength in upper and lower extremities bilaterally.  Continue his Plavix-daughter confirmed that patient texted, advised to follow-up with neurology.  Physical deconditioning Continue PT OT.  Planning for skilled facility, waiting for placement  Malnutrition of moderate degree Augmentin diet as below Nutrition Status: Nutrition Problem: Moderate Malnutrition Etiology: chronic illness (CHF, h/o CVA, chronic diarrhea) Signs/Symptoms: percent weight loss, severe muscle depletion, moderate fat depletion Interventions: Ensure Enlive (each supplement provides 350kcal and 20 grams of protein), MVI, Education    Anemia due to chronic kidney disease Mild anemia noted.  Likely in the setting of CKD.f/u PCP, cbc in 1 wk Recent Labs  Lab 11/16/21 1642 11/17/21 0220 11/18/21 0604  HGB 11.1* 9.9* 9.8*  HCT 33.8* 30.3* 30.0*    Chronic diastolic CHF  (congestive heart failure) (HCC) History of diastolic CHF.  Currently hypovolemic and compensated.  Watch closely while on IV fluids.  Last EF 55 to 60% in 2013.  Weaning of IV fluids Net IO Since Admission: 2,483.91 mL [11/20/21 1003]  BPH (benign prostatic hyperplasia)- (present on admission) Continues finasteride holding Flomax due to orthosatics hypotension-advised to discuss with PCP.  Prolonged QT interval- (present on admission) Monitor EKG  Leukocytosis- (present on admission) Likely reactive.  It has resolved. Recent Labs  Lab 11/16/21 1642 11/17/21 0220 11/18/21 0604  WBC 10.7* 8.1 6.2    Tardive dyskinesia At baseline for many years.  Supportive care fall precaution  Essential hypertension- (present on admission) Blood pressure fluctuates.  Patient is on clonidine resume with holding parameters as it is high today.  PTSD (post-traumatic stress disorder)- (present on admission) Continue with Prozac.  Per wife patient has baseline unsteadiness/tremors of his extremities       Consultants: none Procedures performed: none  Disposition: Skilled nursing facility Diet recommendation:  Diet Orders (From admission, onward)     Start     Ordered   11/16/21 1927  Diet regular Room service appropriate? Yes; Fluid consistency: Thin  Diet effective now       Question Answer Comment  Room service appropriate? Yes   Fluid consistency: Thin      11/16/21 1926             DISCHARGE MEDICATION: Allergies as of 11/20/2021   No Known Allergies  Medication List     TAKE these medications    atorvastatin 40 MG tablet Commonly known as: LIPITOR Take 20 mg by mouth at bedtime.   cloNIDine 0.1 MG tablet Commonly known as: CATAPRES Take 1 tablet (0.1 mg total) by mouth every 8 (eight) hours. Hold of hr < 60/min or sbp <120 What changed: additional instructions   clopidogrel 75 MG tablet Commonly known as: PLAVIX Take 1 tablet (75 mg total) by mouth  daily.   cyanocobalamin 1000 MCG/ML injection Commonly known as: (VITAMIN B-12) every 30 (thirty) days.   doxylamine (Sleep) 25 MG tablet Commonly known as: UNISOM Take 25 mg by mouth at bedtime as needed for sleep or allergies.   finasteride 5 MG tablet Commonly known as: PROSCAR Take 5 mg by mouth daily.   FLUoxetine 20 MG capsule Commonly known as: PROZAC Take 40 mg by mouth daily.   gabapentin 300 MG capsule Commonly known as: NEURONTIN Take 300 mg by mouth 2 (two) times daily.   hydrocerin Crea Apply 1 application topically 3 (three) times daily as needed (dry legs/dry skin).   loperamide 2 MG capsule Commonly known as: IMODIUM Take 2 mg by mouth 4 (four) times daily as needed for diarrhea or loose stools.   METAMUCIL PO Take 30 mLs by mouth in the morning and at bedtime.   mirtazapine 15 MG tablet Commonly known as: REMERON Take 45 mg by mouth at bedtime.   pantoprazole 40 MG tablet Commonly known as: PROTONIX Take 1 tablet (40 mg total) by mouth daily before breakfast.   rivastigmine 9.5 mg/24hr Commonly known as: EXELON APPLY 1 PATCH EXTERNALLY TO THE SKIN EVERY DAY AS DIRECTED What changed:  how much to take how to take this when to take this additional instructions        Follow-up [place] Follow up in 1 week(s).   Specialty: General Practice Contact information: [place] [address] [phone]         [name], MD Follow up in 1 week(s).   Specialties: Neurology, Radiology Contact information: [address] [place] [address] [phone]                 Discharge Exam: Danley Danker Weights   11/17/21 1813 11/18/21 0448  Weight: 70.6 kg 69.5 kg  Condition at discharge: fair  The results of significant diagnostics from this hospitalization (including imaging, microbiology, ancillary and laboratory) are listed below for reference.   Imaging Studies: CT  ABDOMEN PELVIS WO CONTRAST  Result Date: 11/16/2021 CLINICAL DATA:  Fall EXAM: CT ABDOMEN AND PELVIS WITHOUT CONTRAST TECHNIQUE: Multidetector CT imaging of the abdomen and pelvis was performed following the standard protocol without IV contrast. RADIATION DOSE REDUCTION: This exam was performed according to the departmental dose-optimization program which includes automated exposure control, adjustment of the mA and/or kV according to patient size and/or use of iterative reconstruction technique. COMPARISON:  CT abdomen pelvis 02/14/2012 FINDINGS: Lower chest: No acute abnormality. Coronary calcification. Tiny hiatal hernia. Liver: Not enlarged. No focal lesion. No laceration or subcapsular hematoma. Biliary System: Status post cholecystectomy. No biliary ductal dilatation. Pancreas: Normal pancreatic contour. No main pancreatic duct dilatation. Spleen: Not enlarged. No focal lesion. No laceration, subcapsular hematoma, or vascular injury. Adrenal Glands: No nodularity bilaterally. Kidneys: Bilateral kidneys enhance symmetrically. There is a 5.7 x 5.4 cm fluid density lesion within the right kidney that likely represents a simple renal cyst. No hydronephrosis. No  contusion, laceration, or subcapsular hematoma. No injury to the vascular structures or collecting systems. No hydroureter. The urinary bladder is unremarkable. Bowel: No small or large bowel wall thickening or dilatation. Few scattered colonic diverticula. The appendix is unremarkable. Mesentery, Omentum, and Peritoneum: No simple free fluid ascites. No pneumoperitoneum. No hemoperitoneum. No mesenteric hematoma identified. No organized fluid collection. Pelvic Organs: Normal. Lymph Nodes: No abdominal, pelvic, inguinal lymphadenopathy. Vasculature: Severe atherosclerotic plaque. Interval increase in size of an infrarenal abdominal aorta aneurysm status post stent graft repair extending from just inferior to superior mesenteric artery down to bilateral  common iliac arteries. The aneurysm measures up to 5.8 x 5.3 cm (from 3.8 x 3.5 cm in 2013 pre stenting) on axial imaging. The aneurysm measures approximately 8 cm in the craniocaudal dimension. No active contrast extravasation or pseudoaneurysm. Musculoskeletal: No significant soft tissue hematoma. No acute pelvic fracture. Please see separately dictated CT thoracolumbar spine 11/16/21. IMPRESSION: 1. No acute traumatic injury to the abdomen or pelvis with limited evaluation on this noncontrast study. 2. Please see separately dictated CT thoracolumbar spine 11/16/21. 3. Interval increase in size of a stented infrarenal abdominal aorta aneurysm measuring up to 5.8 x 5.3 cm. Recommend referral to a vascular specialist. This recommendation follows ACR consensus guidelines: White Paper of the ACR Incidental Findings Committee II on Vascular Findings. J Am Coll Radiol 2013; 10:789-794. 4. Tiny hiatal hernia. 5. Colonic diverticulosis with no acute diverticulitis. 6. Aortic aneurysm NOS (ICD10-I71.9). Aortic Atherosclerosis (ICD10-I70.0). Electronically Signed   By: Iven Finn M.D.   On: 11/16/2021 18:48   CT Head Wo Contrast  Result Date: 11/16/2021 CLINICAL DATA:  Head trauma, minor (Age >= 65y); Neck trauma (Age >= 65y). Fall EXAM: CT HEAD WITHOUT CONTRAST CT CERVICAL SPINE WITHOUT CONTRAST TECHNIQUE: Multidetector CT imaging of the head and cervical spine was performed following the standard protocol without intravenous contrast. Multiplanar CT image reconstructions of the cervical spine were also generated. RADIATION DOSE REDUCTION: This exam was performed according to the departmental dose-optimization program which includes automated exposure control, adjustment of the mA and/or kV according to patient size and/or use of iterative reconstruction technique. COMPARISON:  None. FINDINGS: CT HEAD FINDINGS BRAIN: BRAIN Limited evaluation due to artifact. Patchy and confluent areas of decreased attenuation are  noted throughout the deep and periventricular white matter of the cerebral hemispheres bilaterally, compatible with chronic microvascular ischemic disease. No evidence of large-territorial acute infarction. No parenchymal hemorrhage. No mass lesion. No extra-axial collection. No mass effect or midline shift. No hydrocephalus. Basilar cisterns are patent. Vascular: No hyperdense vessel. Atherosclerotic calcifications are present within the cavernous internal carotid arteries. Skull: No acute fracture or focal lesion. Sinuses/Orbits: Paranasal sinuses and mastoid air cells are clear. The orbits are unremarkable. Other: None. CT CERVICAL SPINE FINDINGS Alignment: Normal. Skull base and vertebrae: Multilevel severe degenerative changes of the spine. No associated severe osseous neural foraminal or central canal stenosis. No acute fracture. No aggressive appearing focal osseous lesion or focal pathologic process. Soft tissues and spinal canal: No prevertebral fluid or swelling. No visible canal hematoma. Upper chest: Unremarkable. Other: Atherosclerotic plaque of the carotid arteries within the neck. IMPRESSION: 1. No acute intracranial abnormality with limited evaluation due to artifact. 2. No acute displaced fracture or traumatic listhesis of the cervical spine. Electronically Signed   By: Iven Finn M.D.   On: 11/16/2021 17:57   CT Cervical Spine Wo Contrast  Result Date: 11/16/2021 CLINICAL DATA:  Head trauma, minor (Age >= 65y); Neck trauma (Age >=  65y). Fall EXAM: CT HEAD WITHOUT CONTRAST CT CERVICAL SPINE WITHOUT CONTRAST TECHNIQUE: Multidetector CT imaging of the head and cervical spine was performed following the standard protocol without intravenous contrast. Multiplanar CT image reconstructions of the cervical spine were also generated. RADIATION DOSE REDUCTION: This exam was performed according to the departmental dose-optimization program which includes automated exposure control, adjustment of the  mA and/or kV according to patient size and/or use of iterative reconstruction technique. COMPARISON:  None. FINDINGS: CT HEAD FINDINGS BRAIN: BRAIN Limited evaluation due to artifact. Patchy and confluent areas of decreased attenuation are noted throughout the deep and periventricular white matter of the cerebral hemispheres bilaterally, compatible with chronic microvascular ischemic disease. No evidence of large-territorial acute infarction. No parenchymal hemorrhage. No mass lesion. No extra-axial collection. No mass effect or midline shift. No hydrocephalus. Basilar cisterns are patent. Vascular: No hyperdense vessel. Atherosclerotic calcifications are present within the cavernous internal carotid arteries. Skull: No acute fracture or focal lesion. Sinuses/Orbits: Paranasal sinuses and mastoid air cells are clear. The orbits are unremarkable. Other: None. CT CERVICAL SPINE FINDINGS Alignment: Normal. Skull base and vertebrae: Multilevel severe degenerative changes of the spine. No associated severe osseous neural foraminal or central canal stenosis. No acute fracture. No aggressive appearing focal osseous lesion or focal pathologic process. Soft tissues and spinal canal: No prevertebral fluid or swelling. No visible canal hematoma. Upper chest: Unremarkable. Other: Atherosclerotic plaque of the carotid arteries within the neck. IMPRESSION: 1. No acute intracranial abnormality with limited evaluation due to artifact. 2. No acute displaced fracture or traumatic listhesis of the cervical spine. Electronically Signed   By: Iven Finn M.D.   On: 11/16/2021 17:57   CT Thoracic Spine Wo Contrast  Result Date: 11/16/2021 CLINICAL DATA:  Back trauma, no prior imaging (Age >= 16y). Status post fall EXAM: CT THORACIC AND LUMBAR SPINE WITHOUT CONTRAST TECHNIQUE: Multidetector CT imaging of the thoracic and lumbar spine was performed without contrast. Multiplanar CT image reconstructions were also generated.  RADIATION DOSE REDUCTION: This exam was performed according to the departmental dose-optimization program which includes automated exposure control, adjustment of the mA and/or kV according to patient size and/or use of iterative reconstruction technique. COMPARISON:  None. FINDINGS: CT THORACIC SPINE FINDINGS Alignment: Normal. Vertebrae: Multilevel contiguous moderate osteophyte formation. No acute fracture or focal pathologic process. Paraspinal and other soft tissues: Negative. Disc levels: Maintained. CT LUMBAR SPINE FINDINGS Segmentation: 5 lumbar type vertebrae. Alignment: Normal. Vertebrae: Multilevel moderate osteophyte formation. No acute fracture or focal pathologic process. Paraspinal and other soft tissues: Negative. Disc levels: Maintained. Visualized portions of the thorax: Right lower lobe subsegmental atelectasis. Atherosclerotic plaque of the aorta. Four-vessel coronary calcification. Sternotomy wires. Visualized portions of the abdomen and pelvis: Please see separately dictated CT abdomen pelvis 11/16/2021. IMPRESSION: CT THORACIC SPINE IMPRESSION No acute displaced fracture or traumatic listhesis of the thoracic spine. CT LUMBAR SPINE IMPRESSION No acute displaced fracture or traumatic listhesis of the lumbar spine. Please see separately dictated CT abdomen pelvis 11/16/2021. Aortic aneurysm NOS (ICD10-I71.9). Aortic Atherosclerosis (ICD10-I70.0). Electronically Signed   By: Iven Finn M.D.   On: 11/16/2021 18:51   CT L-SPINE NO CHARGE  Result Date: 11/16/2021 CLINICAL DATA:  Back trauma, no prior imaging (Age >= 16y). Status post fall EXAM: CT THORACIC AND LUMBAR SPINE WITHOUT CONTRAST TECHNIQUE: Multidetector CT imaging of the thoracic and lumbar spine was performed without contrast. Multiplanar CT image reconstructions were also generated. RADIATION DOSE REDUCTION: This exam was performed according to the departmental dose-optimization program  which includes automated exposure  control, adjustment of the mA and/or kV according to patient size and/or use of iterative reconstruction technique. COMPARISON:  None. FINDINGS: CT THORACIC SPINE FINDINGS Alignment: Normal. Vertebrae: Multilevel contiguous moderate osteophyte formation. No acute fracture or focal pathologic process. Paraspinal and other soft tissues: Negative. Disc levels: Maintained. CT LUMBAR SPINE FINDINGS Segmentation: 5 lumbar type vertebrae. Alignment: Normal. Vertebrae: Multilevel moderate osteophyte formation. No acute fracture or focal pathologic process. Paraspinal and other soft tissues: Negative. Disc levels: Maintained. Visualized portions of the thorax: Right lower lobe subsegmental atelectasis. Atherosclerotic plaque of the aorta. Four-vessel coronary calcification. Sternotomy wires. Visualized portions of the abdomen and pelvis: Please see separately dictated CT abdomen pelvis 11/16/2021. IMPRESSION: CT THORACIC SPINE IMPRESSION No acute displaced fracture or traumatic listhesis of the thoracic spine. CT LUMBAR SPINE IMPRESSION No acute displaced fracture or traumatic listhesis of the lumbar spine. Please see separately dictated CT abdomen pelvis 11/16/2021. Aortic aneurysm NOS (ICD10-I71.9). Aortic Atherosclerosis (ICD10-I70.0). Electronically Signed   By: Iven Finn M.D.   On: 11/16/2021 18:51   DG Chest Port 1 View  Result Date: 11/16/2021 CLINICAL DATA:  Provided history: Weakness, syncope. Additional history provided: Fall, found on floor by caregiver. EXAM: PORTABLE CHEST 1 VIEW COMPARISON:  Prior chest radiographs 12/05/2012 and earlier. FINDINGS: Please note a portion of the left lateral costophrenic angle is excluded from the field of view. Prior median sternotomy/CABG. Heart size within normal limits. No appreciable airspace consolidation or pulmonary edema. No evidence of pleural effusion or pneumothorax. No acute bony abnormality identified. IMPRESSION: A portion of the left lateral costophrenic  angle is excluded from the field of view. No evidence of acute cardiopulmonary abnormality. Electronically Signed   By: Kellie Simmering D.O.   On: 11/16/2021 16:46    Microbiology: Results for orders placed or performed during the hospital encounter of 11/16/21  Resp Panel by RT-PCR (Flu A&B, Covid) Nasopharyngeal Swab     Status: None   Collection Time: 11/16/21  7:35 PM   Specimen: Nasopharyngeal Swab; Nasopharyngeal(NP) swabs in vial transport medium  Result Value Ref Range Status   SARS Coronavirus 2 by RT PCR NEGATIVE NEGATIVE Final    Comment: (NOTE) SARS-CoV-2 target nucleic acids are NOT DETECTED.  The SARS-CoV-2 RNA is generally detectable in upper respiratory specimens during the acute phase of infection. The lowest concentration of SARS-CoV-2 viral copies this assay can detect is 138 copies/mL. A negative result does not preclude SARS-Cov-2 infection and should not be used as the sole basis for treatment or other patient management decisions. A negative result may occur with  improper specimen collection/handling, submission of specimen other than nasopharyngeal swab, presence of viral mutation(s) within the areas targeted by this assay, and inadequate number of viral copies(<138 copies/mL). A negative result must be combined with clinical observations, patient history, and epidemiological information. The expected result is Negative.  Fact Sheet for Patients:  EntrepreneurPulse.com.au  Fact Sheet for Healthcare Providers:  IncredibleEmployment.be  This test is no t yet approved or cleared by the [place] FDA and  has been authorized for detection and/or diagnosis of SARS-CoV-2 by FDA under an Emergency Use Authorization (EUA). This EUA will remain  in effect (meaning this test can be used) for the duration of the COVID-19 declaration under Section 564(b)(1) of the Act, 21 U.S.C.section 360bbb-3(b)(1), unless the authorization is  terminated  or revoked sooner.       Influenza A by PCR NEGATIVE NEGATIVE Final   Influenza B by PCR NEGATIVE  NEGATIVE Final    Comment: (NOTE) The Xpert Xpress SARS-CoV-2/FLU/RSV plus assay is intended as an aid in the diagnosis of influenza from Nasopharyngeal swab specimens and should not be used as a sole basis for treatment. Nasal washings and aspirates are unacceptable for Xpert Xpress SARS-CoV-2/FLU/RSV testing.  Fact Sheet for Patients: EntrepreneurPulse.com.au  Fact Sheet for Healthcare Providers: IncredibleEmployment.be  This test is not yet approved or cleared by the [place] FDA and has been authorized for detection and/or diagnosis of SARS-CoV-2 by FDA under an Emergency Use Authorization (EUA). This EUA will remain in effect (meaning this test can be used) for the duration of the COVID-19 declaration under Section 564(b)(1) of the Act, 21 U.S.C. section 360bbb-3(b)(1), unless the authorization is terminated or revoked.  Performed at [facility], [place] [address] [place], [place] [address]     Labs: CBC: Recent Labs  Lab 11/16/21 1642 11/17/21 0220 11/18/21 0604  WBC 10.7* 8.1 6.2  NEUTROABS 8.8* 5.8  --   HGB 11.1* 9.9* 9.8*  HCT 33.8* 30.3* 30.0*  MCV 91.8 92.9 92.9  PLT 183 150 185*   Basic Metabolic Panel: Recent Labs  Lab 11/16/21 1642 11/17/21 0220 11/18/21 0604 11/19/21 0501 11/20/21 0522  NA 134* 134* 138 138 138  K 4.8 4.2 4.0 3.8 4.2  CL 104 108 109 109 108  CO2 20* 19* 21* 24 25  GLUCOSE 137* 87 93 102* 109*  BUN 50* 45* 36* 29* 26*  CREATININE 2.32* 2.09* 1.83* 1.65* 1.64*  CALCIUM 8.9 8.4* 8.3* 8.3* 8.5*  MG 2.3 2.2  --   --   --   PHOS  --  4.3  --   --   --    Liver Function Tests: Recent Labs  Lab 11/16/21 1642 11/17/21 0220  AST 32 28  ALT 19 19  ALKPHOS 60 51  BILITOT 0.5 0.7  PROT 6.3* 5.7*  ALBUMIN 4.1 3.6   CBG: No results for input(s): GLUCAP in  the last 168 hours.  Discharge time spent: 35 minutes.  Signed: [name], MD [facility] 11/20/2021

## 2021-11-20 NOTE — Progress Notes (Signed)
Physical Therapy Treatment Patient Details Name: [name] MRN: [id number] DOB: [birth year] Today's Date: 11/20/2021   History of Present Illness Patient is a 76 year old male who presented to the hosptial after a fall at home after dizziness onset. patient was found to have acute renal failuire superimposed on stage III CKD, PMH: diastolic CHF, AAA s/p stent, CAD s/p CABG x3, PTSD    PT Comments    Pt with significant improvement in ambulation, ambulates 180 ft with RW and min guard, maintains narrow BOS without overt LOB.  Pt attempts ambulation without AD, requiring min A for steadying and limited in distance. Pt able to perform BLE strengthening exercises with attention to task cues. Educated pt on safety with mobility, improving awareness to lightheaded feeling, maintaining static standing prior to ambulating to ensure safety and pt's spouse at bedside in agreement with safety cues. Pt reports frequent LOB posterior, none noted during session; pt doesn't live with spouse 24/7 due to pt's spouse living with her parents to assist them as needed. Continue to progress as able.   Recommendations for follow up therapy are one component of a multi-disciplinary discharge planning process, led by the attending physician.  Recommendations may be updated based on patient status, additional functional criteria and insurance authorization.  Follow Up Recommendations  Skilled nursing-short term rehab (<3 hours/day)      Assistance Recommended at Discharge Frequent or constant Supervision/Assistance  Patient can return home with the following A little help with walking and/or transfers;A little help with bathing/dressing/bathroom;Help with stairs or ramp for entrance;Assistance with cooking/housework   Equipment Recommendations  None recommended by PT    Recommendations for Other Services       Precautions / Restrictions Precautions Precautions: Fall Restrictions Weight Bearing  Restrictions: No     Mobility  Bed Mobility  General bed mobility comments: in recliner    Transfers Overall transfer level: Needs assistance Equipment used: Rolling walker (2 wheels) Transfers: Sit to/from Stand Sit to Stand: Supervision  General transfer comment: BUE assisting to power to stand, supv for safety, educated on static standing once rising to ensure no lightheadedness or dizziness occurs    Ambulation/Gait Ambulation/Gait assistance: Min guard, Min assist Gait Distance (Feet): 180 Feet Assistive device: Rolling walker (2 wheels) Gait Pattern/deviations: Step-through pattern, Decreased stride length, Narrow base of support Gait velocity: decreased  General Gait Details: step through pattern with RW and no overt LOB with narrow BOS; once in room pt ambulates ~20 ft without AD, min A to steady with ambulation without AD   Stairs      Wheelchair Mobility    Modified Rankin (Stroke Patients Only)       Balance Overall balance assessment: Needs assistance, History of Falls  Standing balance support: During functional activity Standing balance-Leahy Scale: Fair     Cognition Arousal/Alertness: Awake/alert Behavior During Therapy: WFL for tasks assessed/performed Overall Cognitive Status: Within Functional Limits for tasks assessed  General Comments: pt requires increased cues for safety, spouse reports "he doesn't listen" when it comes to safety        Exercises General Exercises - Lower Extremity Long Arc Quad: Seated, AROM, Strengthening, Both, 20 reps Hip Flexion/Marching: Seated, AROM, Strengthening, Both, 20 reps    General Comments        Pertinent Vitals/Pain Pain Assessment Pain Assessment: No/denies pain    Home Living  Prior Function            PT Goals (current goals can now be found in the care plan section) Acute Rehab PT Goals PT Goal Formulation: With patient Time For Goal Achievement:  11/25/21 Potential to Achieve Goals: Good Progress towards PT goals: Progressing toward goals    Frequency    Min 3X/week      PT Plan Current plan remains appropriate    Co-evaluation              AM-PAC PT "6 Clicks" Mobility   Outcome Measure  Help needed turning from your back to your side while in a flat bed without using bedrails?: A Little Help needed moving from lying on your back to sitting on the side of a flat bed without using bedrails?: A Little Help needed moving to and from a bed to a chair (including a wheelchair)?: A Little Help needed standing up from a chair using your arms (e.g., wheelchair or bedside chair)?: A Little Help needed to walk in hospital room?: A Little Help needed climbing 3-5 steps with a railing? : A Little 6 Click Score: 18    End of Session Equipment Utilized During Treatment: Gait belt Activity Tolerance: Patient tolerated treatment well Patient left: in chair;with call bell/phone within reach;with chair alarm set;with family/visitor present Nurse Communication: Mobility status PT Visit Diagnosis: Difficulty in walking, not elsewhere classified (R26.2)     Time: 7121-9758 PT Time Calculation (min) (ACUTE ONLY): 27 min  Charges:  $Gait Training: 8-22 mins $Therapeutic Exercise: 8-22 mins                      Tori Lahna Nath PT, DPT 11/20/21, 2:02 PM

## 2021-11-20 NOTE — Progress Notes (Signed)
PROGRESS NOTE [name]  [name] DOB: August 09, 1946 DOA: 11/16/2021 PCP: [name]   Brief Narrative/Hospital Course: [name], 76 y.o. male 76 y.o. male with Franklin history significant for chronic diarrhea, chronic diastolic heart failure, essential pretension, AAA status post stent, CAD status post CABG x3 in March 2012, CKD 3A associated baseline creatinine 1.4-1.5 presented to the ED with complaint of fal at home after he stood up became dizzy lightheaded and subsequently lost consciousness falling to the floor  2/12 night, and was also complaining of very little oral intake x72 hours due to his intractable nausea vomiting that he started after eating dinner at [employer] on Friday. He was seen in the ED underwent extensive imaging studies chest x-ray, CT head, CT C spine, CT abdomen pelvis no acute fracture or finding except for abdominal aortic aneurysm 5.8 X5.3 no dissection or pseudoaneurysm. He was found to have AKI, rhabdomyolysis leukocytosis and and admitted for further work-up of his syncope.   Subjective: Seen and examined this morning.  Sitting on the edge of the bed no new complaints.  Blood pressure has been fluctuating and this morning  Wife not at bedside. Assessment and Plan: * Acute renal failure superimposed on stage 3a chronic kidney disease ([place])- (present on admission) Baseline creatinine 1.3-1.5 w/ CKD. Admitted w/ AKI due to patient's poor oral intake with recent nausea vomiting.  AKI improved to 1.6, stable, continue gentle IV fluids.  Encourage oral intake.  Per wife he does not like to drink liquids at all, I counseled extensively Recent Labs  Lab 11/16/21 1642 11/17/21 0220 11/18/21 0604 11/19/21 0501 11/20/21 0522  BUN 50* 45* 36* 29* 26*  CREATININE 2.32* 2.09* 1.83* 1.65* 1.64*    Autonomic orthostatic hypotension Blood pressure labile variable baseline orthostatic.  Has been dealing with this for a number of years.   Added compression stocking.  Symptoms may have been worsened by dehydration as evident by AKI-renal function improved with IV fluids.  Encourage oral intake, gentle hydration.Counseled and encouraged compression stocking and ordered.  He reports he will use it. Continue routine fall precaution instruction environment at home and he will follow-up with PCP.Blood pressure does vary at times running very high-not a candidate for midodrine due to risk of worsening supine hypertension.   Syncope- (present on admission) Orthostatic syncope, in the setting of chronic orthostatic hypotension worsened by his dehydration AKI.  ATN asymptomatic at this time although blood pressure fluctuation diuresis.  Continue PT OT plan for skilled nursing facility.  Extensive imaging in the ED with CTA chest x-ray CT C-spine CT T-spine CT abdomen pelvis.   Metabolic acidosis Resolved.Likely in the setting of AKI on CKD.  Elevated CPK- (present on admission) Mild rhabdomyolysis,after prolonged downtime at home.  Improved.  Nausea & vomiting- (present on admission) Resolved.Tolerating diet.  CT abdomen pelvis no acute finding in GI system  HLD (hyperlipidemia)- (present on admission) Hold statin due to rhabdomyolysis.  Resume upon discharge  CAD (coronary artery disease)- (present on admission) With prior CABG.  Continue Plavix  AAA (abdominal aortic aneurysm) without rupture- (present on admission) Patient had stent placed back in November 2022 and scant following his initial procedure by [facility] showed some leakage and being monitored by his vascular surgery at [facility]  No abdominal discomfort he follows up with [facility]-he had done in Nov 2022, and wife says he has had leakage. It was 5.6  Or 5.8 cm.  Confirmed with patient's  daughter, NP as well, he is followed by [place] with 6 monthly CT scans.  History of CVA (cerebrovascular accident) Stable at this time grossly nonfocal with good strength in upper and  lower extremities bilaterally.  Continue his Plavix .  Physical deconditioning Continue PT OT.  Planning for skilled facility, waiting for placement  Malnutrition of moderate degree Augmentin diet as below Nutrition Status: Nutrition Problem: Moderate Malnutrition Etiology: chronic illness (CHF, h/o CVA, chronic diarrhea) Signs/Symptoms: percent weight loss, severe muscle depletion, moderate fat depletion Interventions: Ensure Enlive (each supplement provides 350kcal and 20 grams of protein), MVI, Education    Anemia due to chronic kidney disease Mild anemia noted.  Likely in the setting of CKD.f/u PCP, cbc in 1 wk Recent Labs  Lab 11/16/21 1642 11/17/21 0220 11/18/21 0604  HGB 11.1* 9.9* 9.8*  HCT 33.8* 30.3* 30.0*    Chronic diastolic CHF (congestive heart failure) (HCC) History of diastolic CHF.  Currently hypovolemic and compensated.  Watch closely while on IV fluids.  Last EF 55 to 60% in 2013.  Weaning of IV fluids Net IO Since Admission: 2,483.91 mL [11/20/21 1003]  BPH (benign prostatic hyperplasia)- (present on admission) Continues finasteride holding Flomax due to orthosatics hypotension-advised to discuss with PCP.  Prolonged QT interval- (present on admission) Monitor EKG  Leukocytosis- (present on admission) Likely reactive.  It has resolved. Recent Labs  Lab 11/16/21 1642 11/17/21 0220 11/18/21 0604  WBC 10.7* 8.1 6.2    Tardive dyskinesia At baseline for many years.  Supportive care fall precaution  PTSD (post-traumatic stress disorder)- (present on admission) Continue with Prozac.  Per wife patient has baseline unsteadiness/tremors of his extremities  DVT prophylaxis: high risk for vte 2/2 mostly being on bed-cont  enoxaparin (LOVENOX) injection 30 mg Start: 11/19/21 1800 SCDs Start: 11/16/21 1927 Code Status:   Code Status: Full Code Family Communication: plan of care discussed with patient/wife at bedside.  Disposition: Inpatient for unsafe  disposition. PT has advised skilled nursing facility and family interested to pursue Awaiting for placement to SNF, pending PSAARA and Bed.  Objective: Vitals last 24 hrs: Vitals:   11/19/21 1147 11/19/21 1346 11/19/21 2020 11/20/21 0409  BP: (!) 116/57 117/69 (!) 161/68 (!) 194/64  Pulse: 86 86 (!) 58 (!) 55  Resp: 20 20 18 18   Temp:   98.3 F (36.8 C) 98.2 F (36.8 C)  TempSrc:   Oral Oral  SpO2: 97% 99% 99% 99%  Weight:      Height:       Weight change:   Physical Examination: General exam: AA0X3, tremulous and shaky at baseline HEENT:Oral mucosa moist, Ear/Nose WNL grossly, dentition normal. Respiratory system: bilaterally clear, no use of accessory muscle Cardiovascular system: S1 & S2 +, No JVD,. Gastrointestinal system: Abdomen soft,NT,ND,BS+ Nervous System:Alert, awake, moving extremities and grossly nonfocal Extremities: LE ankle edema none, distal peripheral pulses palpable.  Skin: No rashes,no icterus. MSK: Normal muscle bulk,tone, power   Medications reviewed:  Scheduled Meds:  clopidogrel  75 mg Oral Daily   enoxaparin (LOVENOX) injection  30 mg Subcutaneous Q24H   feeding supplement  237 mL Oral BID BM   finasteride  5 mg Oral Daily   multivitamin with minerals  1 tablet Oral Daily   pantoprazole  40 mg Oral Daily   Continuous Infusions:  lactated ringers 75 mL/hr at 11/20/21 4765      Diet Order             Diet regular Room service appropriate? Yes; Fluid  consistency: Thin  Diet effective now                    Intake/Output Summary (Last 24 hours) at 11/20/2021 1005 Last data filed at 11/20/2021 0956 Gross per 24 hour  Intake 460 ml  Output 1950 ml  Net -1490 ml   Net IO Since Admission: 2,483.91 mL [11/20/21 1005]  Wt Readings from Last 3 Encounters:  11/18/21 69.5 kg  04/08/16 77 kg  02/15/16 77.1 kg     Unresulted Labs (From admission, onward)    None     Data Reviewed: I have personally reviewed following labs and imaging  studies CBC: Recent Labs  Lab 11/16/21 1642 11/17/21 0220 11/18/21 0604  WBC 10.7* 8.1 6.2  NEUTROABS 8.8* 5.8  --   HGB 11.1* 9.9* 9.8*  HCT 33.8* 30.3* 30.0*  MCV 91.8 92.9 92.9  PLT 183 150 417*   Basic Metabolic Panel: Recent Labs  Lab 11/16/21 1642 11/17/21 0220 11/18/21 0604 11/19/21 0501 11/20/21 0522  NA 134* 134* 138 138 138  K 4.8 4.2 4.0 3.8 4.2  CL 104 108 109 109 108  CO2 20* 19* 21* 24 25  GLUCOSE 137* 87 93 102* 109*  BUN 50* 45* 36* 29* 26*  CREATININE 2.32* 2.09* 1.83* 1.65* 1.64*  CALCIUM 8.9 8.4* 8.3* 8.3* 8.5*  MG 2.3 2.2  --   --   --   PHOS  --  4.3  --   --   --    GFR: Estimated Creatinine Clearance: 37.1 mL/min (A) (by C-G formula based on SCr of 1.64 mg/dL (H)). Liver Function Tests: Recent Labs  Lab 11/16/21 1642 11/17/21 0220  AST 32 28  ALT 19 19  ALKPHOS 60 51  BILITOT 0.5 0.7  PROT 6.3* 5.7*  ALBUMIN 4.1 3.6   No results for input(s): LIPASE, AMYLASE in the last 168 hours. No results for input(s): AMMONIA in the last 168 hours. Coagulation Profile: No results for input(s): INR, PROTIME in the last 168 hours. Cardiac Enzymes: Recent Labs  Lab 11/16/21 1642 11/17/21 0220 11/19/21 0501  CKTOTAL 1,832* 1,167* 170   Radiology Studies: No results found.   LOS: 3 days   [name], MD [facility]  11/20/2021, 10:05 AM

## 2022-01-20 ENCOUNTER — Encounter: Payer: Self-pay | Admitting: Neurology

## 2022-01-20 ENCOUNTER — Institutional Professional Consult (permissible substitution): Payer: Federal, State, Local not specified - PPO | Admitting: Neurology

## 2022-09-23 ENCOUNTER — Other Ambulatory Visit: Payer: Self-pay

## 2022-09-23 ENCOUNTER — Emergency Department (HOSPITAL_COMMUNITY): Payer: No Typology Code available for payment source

## 2022-09-23 ENCOUNTER — Inpatient Hospital Stay (HOSPITAL_COMMUNITY)
Admission: EM | Admit: 2022-09-23 | Discharge: 2022-09-30 | DRG: 682 | Disposition: A | Discharge status: Home / Self Care | Payer: No Typology Code available for payment source | Attending: Internal Medicine | Admitting: Internal Medicine

## 2022-09-23 ENCOUNTER — Encounter (HOSPITAL_COMMUNITY): Payer: Self-pay | Admitting: Radiology

## 2022-09-23 DIAGNOSIS — F39 Unspecified mood [affective] disorder: Secondary | ICD-10-CM | POA: Diagnosis present

## 2022-09-23 DIAGNOSIS — N179 Acute kidney failure, unspecified: Principal | ICD-10-CM | POA: Diagnosis present

## 2022-09-23 DIAGNOSIS — R2689 Other abnormalities of gait and mobility: Secondary | ICD-10-CM | POA: Diagnosis present

## 2022-09-23 DIAGNOSIS — E440 Moderate protein-calorie malnutrition: Secondary | ICD-10-CM | POA: Diagnosis present

## 2022-09-23 DIAGNOSIS — K580 Irritable bowel syndrome with diarrhea: Secondary | ICD-10-CM | POA: Diagnosis not present

## 2022-09-23 DIAGNOSIS — R3911 Hesitancy of micturition: Secondary | ICD-10-CM | POA: Diagnosis present

## 2022-09-23 DIAGNOSIS — K567 Ileus, unspecified: Secondary | ICD-10-CM | POA: Diagnosis present

## 2022-09-23 DIAGNOSIS — E872 Acidosis, unspecified: Secondary | ICD-10-CM

## 2022-09-23 DIAGNOSIS — M6282 Rhabdomyolysis: Secondary | ICD-10-CM | POA: Diagnosis present

## 2022-09-23 DIAGNOSIS — I2510 Atherosclerotic heart disease of native coronary artery without angina pectoris: Secondary | ICD-10-CM | POA: Diagnosis not present

## 2022-09-23 DIAGNOSIS — Z8673 Personal history of transient ischemic attack (TIA), and cerebral infarction without residual deficits: Secondary | ICD-10-CM

## 2022-09-23 DIAGNOSIS — I5032 Chronic diastolic (congestive) heart failure: Secondary | ICD-10-CM | POA: Diagnosis not present

## 2022-09-23 DIAGNOSIS — E1140 Type 2 diabetes mellitus with diabetic neuropathy, unspecified: Secondary | ICD-10-CM | POA: Diagnosis present

## 2022-09-23 DIAGNOSIS — K253 Acute gastric ulcer without hemorrhage or perforation: Secondary | ICD-10-CM

## 2022-09-23 DIAGNOSIS — Z79899 Other long term (current) drug therapy: Secondary | ICD-10-CM

## 2022-09-23 DIAGNOSIS — R9431 Abnormal electrocardiogram [ECG] [EKG]: Secondary | ICD-10-CM

## 2022-09-23 DIAGNOSIS — L89151 Pressure ulcer of sacral region, stage 1: Secondary | ICD-10-CM | POA: Diagnosis present

## 2022-09-23 DIAGNOSIS — G2401 Drug induced subacute dyskinesia: Secondary | ICD-10-CM | POA: Diagnosis not present

## 2022-09-23 DIAGNOSIS — G40909 Epilepsy, unspecified, not intractable, without status epilepticus: Secondary | ICD-10-CM

## 2022-09-23 DIAGNOSIS — Z85820 Personal history of malignant melanoma of skin: Secondary | ICD-10-CM

## 2022-09-23 DIAGNOSIS — K589 Irritable bowel syndrome without diarrhea: Secondary | ICD-10-CM | POA: Diagnosis present

## 2022-09-23 DIAGNOSIS — R300 Dysuria: Secondary | ICD-10-CM

## 2022-09-23 DIAGNOSIS — Z515 Encounter for palliative care: Secondary | ICD-10-CM

## 2022-09-23 DIAGNOSIS — E119 Type 2 diabetes mellitus without complications: Secondary | ICD-10-CM

## 2022-09-23 DIAGNOSIS — I714 Abdominal aortic aneurysm, without rupture, unspecified: Secondary | ICD-10-CM

## 2022-09-23 DIAGNOSIS — N184 Chronic kidney disease, stage 4 (severe): Secondary | ICD-10-CM | POA: Insufficient documentation

## 2022-09-23 DIAGNOSIS — F4310 Post-traumatic stress disorder, unspecified: Secondary | ICD-10-CM | POA: Diagnosis not present

## 2022-09-23 DIAGNOSIS — E871 Hypo-osmolality and hyponatremia: Secondary | ICD-10-CM

## 2022-09-23 DIAGNOSIS — E1122 Type 2 diabetes mellitus with diabetic chronic kidney disease: Secondary | ICD-10-CM | POA: Diagnosis present

## 2022-09-23 DIAGNOSIS — J189 Pneumonia, unspecified organism: Secondary | ICD-10-CM | POA: Diagnosis present

## 2022-09-23 DIAGNOSIS — E860 Dehydration: Secondary | ICD-10-CM | POA: Diagnosis present

## 2022-09-23 DIAGNOSIS — E785 Hyperlipidemia, unspecified: Secondary | ICD-10-CM | POA: Diagnosis present

## 2022-09-23 DIAGNOSIS — I130 Hypertensive heart and chronic kidney disease with heart failure and stage 1 through stage 4 chronic kidney disease, or unspecified chronic kidney disease: Secondary | ICD-10-CM | POA: Diagnosis present

## 2022-09-23 DIAGNOSIS — I700 Atherosclerosis of aorta: Secondary | ICD-10-CM | POA: Diagnosis present

## 2022-09-23 DIAGNOSIS — R0609 Other forms of dyspnea: Secondary | ICD-10-CM | POA: Diagnosis not present

## 2022-09-23 DIAGNOSIS — N1831 Chronic kidney disease, stage 3a: Secondary | ICD-10-CM

## 2022-09-23 DIAGNOSIS — D509 Iron deficiency anemia, unspecified: Secondary | ICD-10-CM | POA: Diagnosis present

## 2022-09-23 DIAGNOSIS — Z66 Do not resuscitate: Secondary | ICD-10-CM | POA: Diagnosis present

## 2022-09-23 DIAGNOSIS — M79606 Pain in leg, unspecified: Secondary | ICD-10-CM | POA: Diagnosis present

## 2022-09-23 DIAGNOSIS — J9811 Atelectasis: Secondary | ICD-10-CM | POA: Diagnosis not present

## 2022-09-23 DIAGNOSIS — Z8679 Personal history of other diseases of the circulatory system: Secondary | ICD-10-CM

## 2022-09-23 DIAGNOSIS — I951 Orthostatic hypotension: Secondary | ICD-10-CM | POA: Diagnosis present

## 2022-09-23 DIAGNOSIS — N1832 Chronic kidney disease, stage 3b: Secondary | ICD-10-CM | POA: Diagnosis present

## 2022-09-23 DIAGNOSIS — E1169 Type 2 diabetes mellitus with other specified complication: Secondary | ICD-10-CM | POA: Diagnosis not present

## 2022-09-23 DIAGNOSIS — R269 Unspecified abnormalities of gait and mobility: Secondary | ICD-10-CM | POA: Diagnosis not present

## 2022-09-23 DIAGNOSIS — J440 Chronic obstructive pulmonary disease with acute lower respiratory infection: Secondary | ICD-10-CM | POA: Diagnosis present

## 2022-09-23 DIAGNOSIS — N400 Enlarged prostate without lower urinary tract symptoms: Secondary | ICD-10-CM

## 2022-09-23 DIAGNOSIS — Z86010 Personal history of colonic polyps: Secondary | ICD-10-CM

## 2022-09-23 DIAGNOSIS — Z825 Family history of asthma and other chronic lower respiratory diseases: Secondary | ICD-10-CM

## 2022-09-23 DIAGNOSIS — Z808 Family history of malignant neoplasm of other organs or systems: Secondary | ICD-10-CM

## 2022-09-23 DIAGNOSIS — Z9049 Acquired absence of other specified parts of digestive tract: Secondary | ICD-10-CM

## 2022-09-23 DIAGNOSIS — F419 Anxiety disorder, unspecified: Secondary | ICD-10-CM | POA: Diagnosis present

## 2022-09-23 DIAGNOSIS — Z85828 Personal history of other malignant neoplasm of skin: Secondary | ICD-10-CM

## 2022-09-23 DIAGNOSIS — N401 Enlarged prostate with lower urinary tract symptoms: Secondary | ICD-10-CM | POA: Diagnosis not present

## 2022-09-23 DIAGNOSIS — I5043 Acute on chronic combined systolic (congestive) and diastolic (congestive) heart failure: Secondary | ICD-10-CM | POA: Diagnosis present

## 2022-09-23 DIAGNOSIS — Z8711 Personal history of peptic ulcer disease: Secondary | ICD-10-CM

## 2022-09-23 DIAGNOSIS — R197 Diarrhea, unspecified: Secondary | ICD-10-CM | POA: Diagnosis not present

## 2022-09-23 DIAGNOSIS — Z951 Presence of aortocoronary bypass graft: Secondary | ICD-10-CM

## 2022-09-23 DIAGNOSIS — Z6822 Body mass index (BMI) 22.0-22.9, adult: Secondary | ICD-10-CM

## 2022-09-23 DIAGNOSIS — K529 Noninfective gastroenteritis and colitis, unspecified: Secondary | ICD-10-CM

## 2022-09-23 DIAGNOSIS — K219 Gastro-esophageal reflux disease without esophagitis: Secondary | ICD-10-CM | POA: Diagnosis present

## 2022-09-23 DIAGNOSIS — Z7902 Long term (current) use of antithrombotics/antiplatelets: Secondary | ICD-10-CM

## 2022-09-23 DIAGNOSIS — Z8249 Family history of ischemic heart disease and other diseases of the circulatory system: Secondary | ICD-10-CM

## 2022-09-23 DIAGNOSIS — Z87891 Personal history of nicotine dependence: Secondary | ICD-10-CM

## 2022-09-23 DIAGNOSIS — G8929 Other chronic pain: Secondary | ICD-10-CM | POA: Diagnosis present

## 2022-09-23 LAB — URINALYSIS, ROUTINE W REFLEX MICROSCOPIC
Bilirubin Urine: NEGATIVE
Glucose, UA: NEGATIVE mg/dL
Ketones, ur: 5 mg/dL — AB
Nitrite: NEGATIVE
Protein, ur: 100 mg/dL — AB
Specific Gravity, Urine: 1.024 (ref 1.005–1.030)
WBC, UA: >50 WBC/hpf — ABNORMAL HIGH (ref 0–5)
pH: 5 (ref 5.0–8.0)

## 2022-09-23 LAB — TYPE AND SCREEN
ABO/RH(D): A POS
Antibody Screen: NEGATIVE

## 2022-09-23 LAB — GASTROINTESTINAL PANEL BY PCR, STOOL (REPLACES STOOL CULTURE)

## 2022-09-23 LAB — COMPREHENSIVE METABOLIC PANEL
ALT: 21 U/L (ref 0–44)
AST: 29 U/L (ref 15–41)
Albumin: 3.7 g/dL (ref 3.5–5.0)
Alkaline Phosphatase: 61 U/L (ref 38–126)
Anion gap: 15 (ref 5–15)
BUN: 86 mg/dL — ABNORMAL HIGH (ref 8–23)
CO2: 15 mmol/L — ABNORMAL LOW (ref 22–32)
Calcium: 8 mg/dL — ABNORMAL LOW (ref 8.9–10.3)
Chloride: 104 mmol/L (ref 98–111)
Creatinine, Ser: 5.15 mg/dL — ABNORMAL HIGH (ref 0.61–1.24)
GFR, Estimated: 11 mL/min — ABNORMAL LOW (ref >60)
Glucose, Bld: 78 mg/dL (ref 70–99)
Potassium: 4.6 mmol/L (ref 3.5–5.1)
Sodium: 134 mmol/L — ABNORMAL LOW (ref 135–145)
Total Bilirubin: 0.7 mg/dL (ref 0.3–1.2)
Total Protein: 6.9 g/dL (ref 6.5–8.1)

## 2022-09-23 LAB — LIPASE, BLOOD: Lipase: 24 U/L (ref 11–51)

## 2022-09-23 LAB — CBC WITH DIFFERENTIAL/PLATELET
Abs Immature Granulocytes: 0.34 10*3/uL — ABNORMAL HIGH (ref 0.00–0.07)
Basophils Absolute: 0 10*3/uL (ref 0.0–0.1)
Basophils Relative: 0 %
Eosinophils Absolute: 0 10*3/uL (ref 0.0–0.5)
Eosinophils Relative: 0 %
HCT: 32 % — ABNORMAL LOW (ref 39.0–52.0)
Hemoglobin: 10.3 g/dL — ABNORMAL LOW (ref 13.0–17.0)
Immature Granulocytes: 2 %
Lymphocytes Relative: 5 %
Lymphs Abs: 0.9 10*3/uL (ref 0.7–4.0)
MCH: 29.9 pg (ref 26.0–34.0)
MCHC: 32.2 g/dL (ref 30.0–36.0)
MCV: 93 fL (ref 80.0–100.0)
Monocytes Absolute: 1.2 10*3/uL — ABNORMAL HIGH (ref 0.1–1.0)
Monocytes Relative: 7 %
Neutro Abs: 14.7 10*3/uL — ABNORMAL HIGH (ref 1.7–7.7)
Neutrophils Relative %: 86 %
Platelets: 189 10*3/uL (ref 150–400)
RBC: 3.44 MIL/uL — ABNORMAL LOW (ref 4.22–5.81)
RDW: 14.1 % (ref 11.5–15.5)
WBC: 17.2 10*3/uL — ABNORMAL HIGH (ref 4.0–10.5)
nRBC: 0 % (ref 0.0–0.2)

## 2022-09-23 LAB — CK: Total CK: 953 U/L — ABNORMAL HIGH (ref 49–397)

## 2022-09-23 LAB — C DIFFICILE QUICK SCREEN W PCR REFLEX
C Diff antigen: NEGATIVE
C Diff interpretation: NOT DETECTED
C Diff toxin: NEGATIVE

## 2022-09-23 LAB — POC OCCULT BLOOD, ED: Fecal Occult Bld: POSITIVE — AB

## 2022-09-23 LAB — LACTIC ACID, PLASMA: Lactic Acid, Venous: 1.3 mmol/L (ref 0.5–1.9)

## 2022-09-23 MED ORDER — LACTATED RINGERS IV BOLUS
1000.0000 mL | Freq: Once | INTRAVENOUS | Status: AC
  Administered 2022-09-23: 1000 mL via INTRAVENOUS

## 2022-09-23 MED ORDER — ONDANSETRON HCL 4 MG PO TABS: 4.0000 mg | ORAL_TABLET | Freq: Four times a day (QID) | ORAL | Status: DC | PRN

## 2022-09-23 MED ORDER — METRONIDAZOLE 500 MG/100ML IV SOLN
500.0000 mg | Freq: Two times a day (BID) | INTRAVENOUS | Status: AC
  Administered 2022-09-23 – 2022-09-28 (×10): 500 mg via INTRAVENOUS
  Filled 2022-09-23 (×10): qty 100

## 2022-09-23 MED ORDER — SODIUM BICARBONATE 650 MG PO TABS
650.0000 mg | ORAL_TABLET | Freq: Three times a day (TID) | ORAL | Status: DC
  Administered 2022-09-23 – 2022-09-30 (×20): 650 mg via ORAL
  Filled 2022-09-23 (×19): qty 1

## 2022-09-23 MED ORDER — ACETAMINOPHEN 325 MG PO TABS: 650.0000 mg | ORAL_TABLET | Freq: Four times a day (QID) | ORAL | Status: DC | PRN

## 2022-09-23 MED ORDER — SODIUM CHLORIDE 0.9 % IV SOLN
2.0000 g | Freq: Every day | INTRAVENOUS | Status: AC
  Administered 2022-09-23 – 2022-09-27 (×5): 2 g via INTRAVENOUS
  Filled 2022-09-23 (×5): qty 20

## 2022-09-23 MED ORDER — METOCLOPRAMIDE HCL 5 MG/ML IJ SOLN
10.0000 mg | Freq: Once | INTRAMUSCULAR | Status: AC
  Administered 2022-09-23: 10 mg via INTRAVENOUS
  Filled 2022-09-23: qty 2

## 2022-09-23 MED ORDER — LACTATED RINGERS IV SOLN: INTRAVENOUS | Status: AC

## 2022-09-23 MED ORDER — ONDANSETRON HCL 4 MG/2ML IJ SOLN: 4.0000 mg | Freq: Four times a day (QID) | INTRAMUSCULAR | Status: DC | PRN

## 2022-09-23 MED ORDER — LACTATED RINGERS IV BOLUS
500.0000 mL | Freq: Once | INTRAVENOUS | Status: AC
  Administered 2022-09-24: 500 mL via INTRAVENOUS

## 2022-09-23 MED ORDER — LACTATED RINGERS IV SOLN: INTRAVENOUS | Status: DC

## 2022-09-23 MED ORDER — PANTOPRAZOLE SODIUM 40 MG IV SOLR
40.0000 mg | Freq: Two times a day (BID) | INTRAVENOUS | Status: DC
  Administered 2022-09-23 – 2022-09-30 (×13): 40 mg via INTRAVENOUS
  Filled 2022-09-23 (×14): qty 10

## 2022-09-23 MED ORDER — PNEUMOCOCCAL 20-VAL CONJ VACC 0.5 ML IM SUSY
0.5000 mL | PREFILLED_SYRINGE | INTRAMUSCULAR | Status: DC
  Filled 2022-09-23: qty 0.5

## 2022-09-23 MED ORDER — ACETAMINOPHEN 650 MG RE SUPP: 650.0000 mg | Freq: Four times a day (QID) | RECTAL | Status: DC | PRN

## 2022-09-23 NOTE — ED Provider Notes (Addendum)
[facility] DEPT Provider Note   CSN: [id number] Arrival date & time: 09/23/22  1016     History  Chief Complaint  Patient presents with   Diarrhea    [name] is a 76 y.o. male.  Patient is a 76 year old male with a history of hypertension, hyperlipidemia, chronic leg pain, anemia status post transfusions, CAD, CVA, COPD, seizures, prior duodenal ulcers who is presenting today with complaint of diarrhea and abdominal pain.  Patient is a poor historian and reports he went to the [place] on Tuesday and had a CAT scan but he has been feeling worse since then.  It is unclear why he was getting a CAT scan or what they did a CAT scan of.  Records are not available.  Unclear how long patient has been having symptoms but reports black stools and diarrhea, abdominal pain.  He denies any nausea or vomiting but reports very poor oral intake.  No known fevers.  He denies any chest pain, cough or congestion.  He is on Plavix.  The history is provided by the patient and medical records.  Diarrhea      Home Medications Prior to Admission medications   Medication Sig Start Date End Date Taking? Authorizing Provider  atorvastatin (LIPITOR) 40 MG tablet Take 20 mg by mouth at bedtime.    [provider]  cloNIDine (CATAPRES) 0.1 MG tablet Take 1 tablet (0.1 mg total) by mouth every 8 (eight) hours. Hold of hr < 60/min or sbp <120 11/20/21   [name], MD  clopidogrel (PLAVIX) 75 MG tablet Take 1 tablet (75 mg total) by mouth daily. 06/28/13   [name], MD  cyanocobalamin (,VITAMIN B-12,) 1000 MCG/ML injection every 30 (thirty) days. 05/15/13   [provider]  doxylamine, Sleep, (UNISOM) 25 MG tablet Take 25 mg by mouth at bedtime as needed for sleep or allergies.    [provider]  finasteride (PROSCAR) 5 MG tablet Take 5 mg by mouth daily.  03/13/13   [provider]  FLUoxetine (PROZAC) 20 MG capsule Take 40 mg by mouth  daily. 08/24/15   [provider]  gabapentin (NEURONTIN) 300 MG capsule Take 300 mg by mouth 2 (two) times daily.    [provider]  hydrocerin (EUCERIN) CREA Apply 1 application topically 3 (three) times daily as needed (dry legs/dry skin).    [provider]  loperamide (IMODIUM) 2 MG capsule Take 2 mg by mouth 4 (four) times daily as needed for diarrhea or loose stools.    [provider]  mirtazapine (REMERON) 15 MG tablet Take 45 mg by mouth at bedtime.    [provider]  pantoprazole (PROTONIX) 40 MG tablet Take 1 tablet (40 mg total) by mouth daily before breakfast. 11/20/21   [name], MD  Psyllium (METAMUCIL PO) Take 30 mLs by mouth in the morning and at bedtime. Patient not taking: Reported on 11/18/2021    [provider]  rivastigmine (EXELON) 9.5 mg/24hr APPLY 1 PATCH EXTERNALLY TO THE SKIN EVERY DAY AS DIRECTED Patient taking differently: Place 9.5 mg onto the skin daily. 10/27/15   Ward Givens, NP  metoprolol (LOPRESSOR) 50 MG tablet Take 0.5 tablets (25 mg total) by mouth 2 (two) times daily. 03/05/11 12/09/11  [name], MD      Allergies    Patient has no known allergies.    Review of Systems   Review of Systems  Gastrointestinal:  Positive for diarrhea.  Physical Exam Updated Vital Signs BP (!) 127/54   Pulse 84   Temp 98.7 F (37.1 C) (Oral)   Resp (!) 24   Ht '5\' 9"'$  (1.753 m)   Wt 69 kg   SpO2 99%   BMI 22.46 kg/m  Physical Exam Vitals and nursing note reviewed.  Constitutional:      General: He is not in acute distress.    Appearance: He is well-developed. He is ill-appearing.     Comments: Stool dried on his hands and legs  HENT:     Head: Normocephalic and atraumatic.     Mouth/Throat:     Mouth: Mucous membranes are dry.  Eyes:     Conjunctiva/sclera: Conjunctivae normal.     Pupils: Pupils are equal, round, and reactive to light.  Cardiovascular:     Rate and Rhythm: Normal rate and  regular rhythm.     Heart sounds: No murmur heard. Pulmonary:     Effort: Pulmonary effort is normal. No respiratory distress.     Breath sounds: Normal breath sounds. No wheezing or rales.  Abdominal:     General: There is distension.     Palpations: Abdomen is soft.     Tenderness: There is abdominal tenderness. There is guarding. There is no rebound.     Comments: Diffuse tenderness  Genitourinary:    Comments: Black tarry stool that is heme positive Musculoskeletal:        General: No tenderness. Normal range of motion.     Cervical back: Normal range of motion and neck supple.  Skin:    General: Skin is warm and dry.     Findings: No erythema or rash.  Neurological:     Mental Status: He is alert and oriented to person, place, and time. Mental status is at baseline.  Psychiatric:        Mood and Affect: Mood normal.        Behavior: Behavior normal.     ED Results / Procedures / Treatments   Labs (all labs ordered are listed, but only abnormal results are displayed) Labs Reviewed  CBC WITH DIFFERENTIAL/PLATELET - Abnormal; Notable for the following components:      Result Value   WBC 17.2 (*)    RBC 3.44 (*)    Hemoglobin 10.3 (*)    HCT 32.0 (*)    Neutro Abs 14.7 (*)    Monocytes Absolute 1.2 (*)    Abs Immature Granulocytes 0.34 (*)    All other components within normal limits  COMPREHENSIVE METABOLIC PANEL - Abnormal; Notable for the following components:   Sodium 134 (*)    CO2 15 (*)    BUN 86 (*)    Creatinine, Ser 5.15 (*)    Calcium 8.0 (*)    GFR, Estimated 11 (*)    All other components within normal limits  URINALYSIS, ROUTINE W REFLEX MICROSCOPIC - Abnormal; Notable for the following components:   Color, Urine AMBER (*)    APPearance HAZY (*)    Hgb urine dipstick MODERATE (*)    Ketones, ur 5 (*)    Protein, ur 100 (*)    Leukocytes,Ua MODERATE (*)    WBC, UA >50 (*)    Bacteria, UA RARE (*)    All other components within normal limits  POC  OCCULT BLOOD, ED - Abnormal; Notable for the following components:   Fecal Occult Bld POSITIVE (*)    All other components within normal limits  C DIFFICILE QUICK SCREEN  W PCR REFLEX    LIPASE, BLOOD  LACTIC ACID, PLASMA  LACTIC ACID, PLASMA  TYPE AND SCREEN    EKG None  Radiology CT ABDOMEN PELVIS WO CONTRAST  Result Date: 09/23/2022 CLINICAL DATA:  Abdominal pain, diarrhea EXAM: CT ABDOMEN AND PELVIS WITHOUT CONTRAST TECHNIQUE: Multidetector CT imaging of the abdomen and pelvis was performed following the standard protocol without IV contrast. RADIATION DOSE REDUCTION: This exam was performed according to the departmental dose-optimization program which includes automated exposure control, adjustment of the mA and/or kV according to patient size and/or use of iterative reconstruction technique. COMPARISON:  11/16/2021 FINDINGS: Lower chest: Heart is enlarged in size. Coronary artery calcifications are seen. There is slight increase in interstitial markings in the periphery of lower lung fields, possibly minimal scarring or passive congestive change. Metallic sutures are seen in the sternum. Hepatobiliary: There is no dilation of bile ducts. Surgical clips are seen in gallbladder fossa. Pancreas: No focal abnormalities are seen. Spleen: Unremarkable. Adrenals/Urinary Tract: Adrenals are unremarkable. There is no hydronephrosis. There is 5.3 cm cyst in the upper pole of right kidney. There are foci of cortical thinning in the kidneys. There are scattered calcifications in the renal artery branches. There is 2 mm calcific density in the midportion of right kidney, possibly nonobstructing renal stone or calcification in a small renal artery branch. Ureters are not dilated. There is contrast in the lumen of the bladder suggesting recent contrast administration. Stomach/Bowel: Small hiatal hernia is seen. Stomach is not distended. Small bowel loops are not dilated. Appendix is not dilated. There is mild  diffuse wall thickening in sigmoid colon. Scattered diverticula are seen in colon. There is no loculated pericolic fluid collection. Vascular/Lymphatic: There is infrarenal aortic aneurysm measuring 5.7 cm with no significant interval change. There is previous aorto bi-iliac stent graft. Extensive calcifications are seen in aorta and its major branches. Reproductive: There are coarse calcifications in prostate. Prostate is enlarged projecting into the base of the bladder. Other: There is no ascites or pneumoperitoneum. Gynecomastia is seen in both breasts. Musculoskeletal: Degenerative changes are noted in lumbar spine. IMPRESSION: There is no evidence of intestinal obstruction or pneumoperitoneum. Appendix is not dilated. There is no hydronephrosis. There is diffuse wall thickening in sigmoid colon suggesting inflammatory or infectious colitis. Scattered diverticula are seen in colon without signs of focal acute diverticulitis. Small hiatal hernia. Right renal cysts. Aortic atherosclerosis. There is aneurism in infrarenal abdominal aorta with no significant interval change. There is previous aorto bi-iliac stent graft. There is subtle interval increase in interstitial markings in the periphery of both lower lung fields suggesting possible interstitial pneumonia or scarring. Coronary artery calcifications are seen. Other findings as described in the body of the report. Electronically Signed   By: Elmer Picker M.D.   On: 09/23/2022 15:12    Procedures Procedures    Medications Ordered in ED Medications  lactated ringers infusion ( Intravenous New Bag/Given 09/23/22 1511)  lactated ringers bolus 1,000 mL (1,000 mLs Intravenous New Bag/Given 09/23/22 1208)  metoCLOPramide (REGLAN) injection 10 mg (10 mg Intravenous Given 09/23/22 1209)    ED Course/ Medical Decision Making/ A&P                           Medical Decision Making Amount and/or Complexity of Data Reviewed Independent Historian:  spouse External Data Reviewed: notes.    Details: [facility] Labs: ordered. Decision-making details documented in ED Course. Radiology: ordered and independent interpretation performed.  Decision-making details documented in ED Course.  Risk Prescription drug management. Decision regarding hospitalization.   Pt with multiple medical problems and comorbidities and presenting today with a complaint that caries a high risk for morbidity and mortality.  Here today with abdominal pain, severe diarrhea unclear length of time.  Patient's stool is black and he has a prior history of GI bleeding.  Concern for diverticulitis, PUD, duodenal ulcer or other intestinal bleed.  Concern for anemia.  Currently patient is hemodynamically stable.  Denies any chest symptoms and low suspicion for cardiac or respiratory issues at this time.  Labs are pending.  Feel that patient will most likely need a CT for further evaluation.  He was given IV fluids.  3:39 PM Patient's wife is now present and she reports that he was at the [place] on Tuesday for a CT to monitor his AAA but had otherwise been feeling fine until they got home late on Tuesday and he has had severe diarrhea since that time.  I independently interpreted patient's labs and he has a leukocytosis of 17, stable hemoglobin of 10, normal platelet count, CMP with new acute kidney failure with a creatinine of 5.15 from based on his wife 2.8 at the [place] recently with an anion gap of 15.  Potassium is normal at 4.6 and mild hyponatremia of 134.  Lipase and lactic acid are within normal limits.  Hemoccult is positive but may be related to all the diarrhea.  CT for further evaluation is pending.  Wife denies any recent antibiotic use with lower likelihood for C. difficile at this time.  Patient continued with IV hydration.  Patient will need admission for further care and AKI.  Findings discussed with patient and his wife and they are comfortable with this.  3:39 PM I have  independently visualized and interpreted pt's images today.  CT today without evidence of pyelonephritis or perforation.  Radiology reports no evidence of appendicitis diffuse wall thickening in the sigmoid colon suggestive of an inflammatory or infectious colitis without diverticulitis.  C. difficile is pending.  Will admit for further care.  4:28 PM Spoke with [facility] who will consult on the pt and recommended at this time protonix bid.        Final Clinical Impression(s) / ED Diagnoses Final diagnoses:  Diarrhea of presumed infectious origin  AKI (acute kidney injury) ([place])  Dehydration    Rx / DC Orders ED Discharge Orders     None         Blanchie Dessert, MD 09/23/22 1539    Blanchie Dessert, MD 09/23/22 1629

## 2022-09-23 NOTE — ED Triage Notes (Signed)
Pt bib ems from home c/o 2 days of diarrhea and weakness.  Cbg 92 97% room air  92 hear rate
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
## 2022-09-24 ENCOUNTER — Inpatient Hospital Stay (HOSPITAL_COMMUNITY): Payer: No Typology Code available for payment source

## 2022-09-24 DIAGNOSIS — R3911 Hesitancy of micturition: Secondary | ICD-10-CM

## 2022-09-24 DIAGNOSIS — R748 Abnormal levels of other serum enzymes: Secondary | ICD-10-CM

## 2022-09-24 DIAGNOSIS — N179 Acute kidney failure, unspecified: Secondary | ICD-10-CM | POA: Diagnosis not present

## 2022-09-24 DIAGNOSIS — I2510 Atherosclerotic heart disease of native coronary artery without angina pectoris: Secondary | ICD-10-CM | POA: Diagnosis not present

## 2022-09-24 DIAGNOSIS — N1831 Chronic kidney disease, stage 3a: Secondary | ICD-10-CM | POA: Diagnosis not present

## 2022-09-24 DIAGNOSIS — N401 Enlarged prostate with lower urinary tract symptoms: Secondary | ICD-10-CM

## 2022-09-24 DIAGNOSIS — I5032 Chronic diastolic (congestive) heart failure: Secondary | ICD-10-CM | POA: Diagnosis not present

## 2022-09-24 LAB — RENAL FUNCTION PANEL
Albumin: 2.7 g/dL — ABNORMAL LOW (ref 3.5–5.0)
Anion gap: 14 (ref 5–15)
BUN: 67 mg/dL — ABNORMAL HIGH (ref 8–23)
CO2: 12 mmol/L — ABNORMAL LOW (ref 22–32)
Calcium: 7.3 mg/dL — ABNORMAL LOW (ref 8.9–10.3)
Chloride: 108 mmol/L (ref 98–111)
Creatinine, Ser: 3.91 mg/dL — ABNORMAL HIGH (ref 0.61–1.24)
GFR, Estimated: 15 mL/min — ABNORMAL LOW (ref >60)
Glucose, Bld: 86 mg/dL (ref 70–99)
Phosphorus: 4.8 mg/dL — ABNORMAL HIGH (ref 2.5–4.6)
Potassium: 4.2 mmol/L (ref 3.5–5.1)
Sodium: 134 mmol/L — ABNORMAL LOW (ref 135–145)

## 2022-09-24 LAB — CBC
HCT: 27.7 % — ABNORMAL LOW (ref 39.0–52.0)
Hemoglobin: 9 g/dL — ABNORMAL LOW (ref 13.0–17.0)
MCH: 30 pg (ref 26.0–34.0)
MCHC: 32.5 g/dL (ref 30.0–36.0)
MCV: 92.3 fL (ref 80.0–100.0)
Platelets: 138 10*3/uL — ABNORMAL LOW (ref 150–400)
RBC: 3 MIL/uL — ABNORMAL LOW (ref 4.22–5.81)
RDW: 14.1 % (ref 11.5–15.5)
WBC: 10.8 10*3/uL — ABNORMAL HIGH (ref 4.0–10.5)
nRBC: 0 % (ref 0.0–0.2)

## 2022-09-24 LAB — BRAIN NATRIURETIC PEPTIDE: B Natriuretic Peptide: 2778.8 pg/mL — ABNORMAL HIGH (ref 0.0–100.0)

## 2022-09-24 LAB — URINE CULTURE: Culture: <10000 — AB

## 2022-09-24 LAB — CK: Total CK: 695 U/L — ABNORMAL HIGH (ref 49–397)

## 2022-09-24 LAB — MAGNESIUM: Magnesium: 2 mg/dL (ref 1.7–2.4)

## 2022-09-24 MED ORDER — FINASTERIDE 5 MG PO TABS
5.0000 mg | ORAL_TABLET | Freq: Every day | ORAL | Status: DC
  Administered 2022-09-25 – 2022-09-30 (×6): 5 mg via ORAL
  Filled 2022-09-24 (×6): qty 1

## 2022-09-24 MED ORDER — LOPERAMIDE HCL 2 MG PO CAPS: 2.0000 mg | ORAL_CAPSULE | ORAL | Status: DC | PRN

## 2022-09-24 MED ORDER — LACTATED RINGERS IV BOLUS
1000.0000 mL | Freq: Once | INTRAVENOUS | Status: AC
  Administered 2022-09-24: 1000 mL via INTRAVENOUS

## 2022-09-24 MED ORDER — LACTATED RINGERS IV SOLN: INTRAVENOUS | Status: DC

## 2022-09-24 MED ORDER — LOPERAMIDE HCL 2 MG PO CAPS
2.0000 mg | ORAL_CAPSULE | Freq: Four times a day (QID) | ORAL | Status: DC
  Administered 2022-09-24 (×2): 2 mg via ORAL
  Filled 2022-09-24 (×3): qty 1

## 2022-09-24 MED ORDER — GABAPENTIN 100 MG PO CAPS
100.0000 mg | ORAL_CAPSULE | Freq: Every day | ORAL | Status: DC
  Administered 2022-09-24 – 2022-09-29 (×6): 100 mg via ORAL
  Filled 2022-09-24 (×6): qty 1

## 2022-09-24 NOTE — Progress Notes (Signed)
PROGRESS NOTE  [name] VJK:[id number] DOB: Feb 19, 1946   PCP: [facility] [place]  Patient is from: Home.  Lives with his wife.  DOA: 09/23/2022 LOS: 1  Chief complaints Chief Complaint  Patient presents with   Diarrhea     Brief Narrative / Interim history: 76 y.o. male with PMH of IBS with diarrhea, duodenal ulcer, CVA, tardive dyskinesia, seizure disorder, PTSD, prolonged QT, gait abnormality, DM-2, diastolic CHF, CAD/CABG, orthostatic hypotension, BPH and AAA s/p aorto bi-iliac stent graft presenting with 2 days of abdominal pain and diarrhea, and admitted for AKI and sigmoid colitis.  Started on IV fluid, IV ceftriaxone and IV Flagyl.  Hemoccult positive.  C. difficile and GIP negative.  Hemoccult positive.  Improving.  GI on board.  Subjective: Seen and examined earlier this morning.  No major events overnight of this morning.  Continues to endorse watery diarrhea but denies abdominal pain.  Reports making good amount of urine.  Denies chest pain, shortness of breath or cough.  Overall, patient is not a great historian.  Objective: Vitals:   09/23/22 1645 09/23/22 1700 09/23/22 1844 09/24/22 0427  BP: 133/72 (!) 119/57 (!) 126/54 121/67  Pulse: 84 84 83 (!) 102  Resp: 18 18 (!) 24 18  Temp:  98.8 F (37.1 C) 99.1 F (37.3 C) 99.5 F (37.5 C)  TempSrc:  Oral Oral Oral  SpO2: 96% 100% 98% 96%  Weight:      Height:        Examination:  GENERAL: No apparent distress.  Nontoxic. HEENT: MMM.  Vision and hearing grossly intact.  NECK: Supple.  No apparent JVD.  RESP:  No IWOB.  Fair aeration bilaterally. CVS:  RRR. Heart sounds normal.  ABD/GI/GU: BS+. Abd soft, NTND.  MSK/EXT:  Moves extremities. No apparent deformity. No edema.  SKIN: no apparent skin lesion or wound NEURO: Awake, alert and oriented x 4 except date..  Tardive dyskinesia.  No apparent focal neuro deficit. PSYCH: Calm. Normal affect.   Procedures:  None  Microbiology summarized: C.  difficile negative GIP negative Urine culture pending  Assessment and plan: Principal Problem:   AKI (acute kidney injury) ([facility]) Active Problems:   PTSD (post-traumatic stress disorder)   GAIT IMBALANCE   CAD (coronary artery disease)   Seizure disorder (HCC)   Tardive dyskinesia   History of CVA (cerebrovascular accident)   IBS (irritable bowel syndrome) with episodic urgent diarrhea   Diabetes mellitus ([place])   AAA (abdominal aortic aneurysm) without rupture (HCC)   Prolonged QT interval   BPH (benign prostatic hyperplasia)   Chronic diastolic CHF (congestive heart failure) (HCC)   Chronic kidney disease, stage 3a ([place])  AKI on CKD-3B: Reportedly last Cr about 2.0 when he went to [place] and had a CT angio.  AKI likely due to diarrhea and dehydration.  CT A/P without obstruction.  Denies NSAID use.  CK elevated to 900s. Recent Labs    11/16/21 1642 11/17/21 0220 11/18/21 0604 11/19/21 0501 11/20/21 0522 09/23/22 1217 09/24/22 0518  BUN 50* 45* 36* 29* 26* 86* 67*  CREATININE 2.32* 2.09* 1.83* 1.65* 1.64* 5.15* 3.91*  -Continue IV LR at 125 cc an hour -Recheck renal function in the morning -Strict intake and output -Renally dose medications   Abdominal pain/diarrhea/sigmoid colitis: Unclear if this is infectious or inflammatory.  He has history of IBS with diarrhea.  He has leukocytosis but no fever.  Abdominal tenderness improved.  C. difficile and GIP negative.  Continues to endorse diarrhea. -  Started Imodium every 6 hours since patient is not reliable to ask  -Continue IV fluid hydration as above -Continue IV ceftriaxone and IV Flagyl   Normocytic anemia/positive Hemoccult: Positive Hemoccult could be from sigmoid colitis.  He is on Plavix.  H&H seems to be stable.  Patient denies melena or hematochezia. Recent Labs    11/16/21 1642 11/17/21 0220 11/18/21 0604 09/23/22 1217 09/24/22 0518  HGB 11.1* 9.9* 9.8* 10.3* 9.0*  -Continue p.o. Protonix -Follow GI recs    Metabolic acidosis/hyponatremia: Likely due to renal failure. -Continue p.o. sodium bicarb 650 mg 3 times daily  Mild elevated CK: Likely from dehydration.  Improving. -Continue IV fluid -Recheck in the morning   History of CVA -Continue home meds after med rec   History of CAD/CABG: No cardiopulmonary symptoms. -Continue home meds after med rec   Tardive dyskinesia -Avoid antidopaminergic drugs   History of prolonged QTc: QTc 487 but with wide QRS. -Minimize QT prolonging drugs -Optimize electrolytes   Orthostatic hypotension -Ensure hydration.   Physical deconditioning/gait abnormality -PT/OT   Dysuria/urinary hesitancy: Has a history of BPH.  UA not convincing -Follow urine cultures. -Antibiotics as above   AAA s/p aortoiliac bypass. -Hold home Plavix pending GI eval   Mood disorder -Resume home meds after med rec  Body mass index is 22.46 kg/m.  Pressure skin injury: POA Pressure Injury 09/23/22 Coccyx Mid Stage 1 -  Intact skin with non-blanchable redness of a localized area usually over a bony prominence. (Active)  09/23/22 1816  Location: Coccyx  Location Orientation: Mid  Staging: Stage 1 -  Intact skin with non-blanchable redness of a localized area usually over a bony prominence.  Wound Description (Comments):   Present on Admission: Yes  Dressing Type None 09/24/22 0800   DVT prophylaxis:  SCDs Start: 09/23/22 1614  Code Status: DNR/DNI Family Communication: Updated patient's daughter over the phone. Level of care: Med-Surg Status is: Inpatient Remains inpatient appropriate because: AKI, diarrhea and sigmoid colitis   Final disposition: TBD Consultants:  Gastroenterology  Sch Meds:  Scheduled Meds:  loperamide  2 mg Oral Q6H   pantoprazole (PROTONIX) IV  40 mg Intravenous Q12H   pneumococcal 20-valent conjugate vaccine  0.5 mL Intramuscular Tomorrow-1000   sodium bicarbonate  650 mg Oral TID   Continuous Infusions:  cefTRIAXone  (ROCEPHIN)  IV 2 g (09/23/22 1701)   lactated ringers 125 mL/hr at 09/23/22 1511   metronidazole 500 mg (09/24/22 1134)   PRN Meds:.acetaminophen **OR** acetaminophen  Antimicrobials: Anti-infectives (From admission, onward)    Start     Dose/Rate Route Frequency Ordered Stop   09/23/22 1645  cefTRIAXone (ROCEPHIN) 2 g in sodium chloride 0.9 % 100 mL IVPB        2 g 200 mL/hr over 30 Minutes Intravenous Daily 09/23/22 1630 09/28/22 1359   09/23/22 1645  metroNIDAZOLE (FLAGYL) IVPB 500 mg        500 mg 100 mL/hr over 60 Minutes Intravenous 2 times daily 09/23/22 1630 09/28/22 2159        I have personally reviewed the following labs and images: CBC: Recent Labs  Lab 09/23/22 1217 09/24/22 0518  WBC 17.2* 10.8*  NEUTROABS 14.7*  --   HGB 10.3* 9.0*  HCT 32.0* 27.7*  MCV 93.0 92.3  PLT 189 138*   BMP &GFR Recent Labs  Lab 09/23/22 1217 09/24/22 0518  NA 134* 134*  K 4.6 4.2  CL 104 108  CO2 15* 12*  GLUCOSE 78 86  BUN  86* 67*  CREATININE 5.15* 3.91*  CALCIUM 8.0* 7.3*  MG  --  2.0  PHOS  --  4.8*   Estimated Creatinine Clearance: 15.4 mL/min (A) (by C-G formula based on SCr of 3.91 mg/dL (H)). Liver & Pancreas: Recent Labs  Lab 09/23/22 1217 09/24/22 0518  AST 29  --   ALT 21  --   ALKPHOS 61  --   BILITOT 0.7  --   PROT 6.9  --   ALBUMIN 3.7 2.7*   Recent Labs  Lab 09/23/22 1217  LIPASE 24   No results for input(s): "AMMONIA" in the last 168 hours. Diabetic: No results for input(s): "HGBA1C" in the last 72 hours. No results for input(s): "GLUCAP" in the last 168 hours. Cardiac Enzymes: Recent Labs  Lab 09/23/22 1930 09/24/22 0518  CKTOTAL 953* 695*   No results for input(s): "PROBNP" in the last 8760 hours. Coagulation Profile: No results for input(s): "INR", "PROTIME" in the last 168 hours. Thyroid Function Tests: No results for input(s): "TSH", "T4TOTAL", "FREET4", "T3FREE", "THYROIDAB" in the last 72 hours. Lipid Profile: No  results for input(s): "CHOL", "HDL", "LDLCALC", "TRIG", "CHOLHDL", "LDLDIRECT" in the last 72 hours. Anemia Panel: No results for input(s): "VITAMINB12", "FOLATE", "FERRITIN", "TIBC", "IRON", "RETICCTPCT" in the last 72 hours. Urine analysis:    Component Value Date/Time   COLORURINE AMBER (A) 09/23/2022 1400   APPEARANCEUR HAZY (A) 09/23/2022 1400   LABSPEC 1.024 09/23/2022 1400   PHURINE 5.0 09/23/2022 1400   GLUCOSEU NEGATIVE 09/23/2022 1400   HGBUR MODERATE (A) 09/23/2022 1400   BILIRUBINUR NEGATIVE 09/23/2022 1400   BILIRUBINUR Neg 02/18/2012 1437   KETONESUR 5 (A) 09/23/2022 1400   PROTEINUR 100 (A) 09/23/2022 1400   UROBILINOGEN 0.2 12/05/2012 1535   NITRITE NEGATIVE 09/23/2022 1400   LEUKOCYTESUR MODERATE (A) 09/23/2022 1400   Sepsis Labs: Invalid input(s): "PROCALCITONIN", "LACTICIDVEN"  Microbiology: Recent Results (from the past 240 hour(s))  C Difficile Quick Screen w PCR reflex     Status: None   Collection Time: 09/23/22  1:00 PM   Specimen: STOOL  Result Value Ref Range Status   C Diff antigen NEGATIVE NEGATIVE Final   C Diff toxin NEGATIVE NEGATIVE Final   C Diff interpretation No C. difficile detected.  Final    Comment: Performed at [facility], [place] [address] [place], [place] [address]  Gastrointestinal Panel by PCR , Stool     Status: None   Collection Time: 09/23/22  1:00 PM   Specimen: Stool  Result Value Ref Range Status   Campylobacter species NOT DETECTED NOT DETECTED Final   Plesimonas shigelloides NOT DETECTED NOT DETECTED Final   Salmonella species NOT DETECTED NOT DETECTED Final   Yersinia enterocolitica NOT DETECTED NOT DETECTED Final   Vibrio species NOT DETECTED NOT DETECTED Final   Vibrio cholerae NOT DETECTED NOT DETECTED Final   Enteroaggregative E coli (EAEC) NOT DETECTED NOT DETECTED Final   Enteropathogenic E coli (EPEC) NOT DETECTED NOT DETECTED Final   Enterotoxigenic E coli (ETEC) NOT DETECTED NOT DETECTED  Final   Shiga like toxin producing E coli (STEC) NOT DETECTED NOT DETECTED Final   Shigella/Enteroinvasive E coli (EIEC) NOT DETECTED NOT DETECTED Final   Cryptosporidium NOT DETECTED NOT DETECTED Final   Cyclospora cayetanensis NOT DETECTED NOT DETECTED Final   Entamoeba histolytica NOT DETECTED NOT DETECTED Final   Giardia lamblia NOT DETECTED NOT DETECTED Final   Adenovirus F40/41 NOT DETECTED NOT DETECTED Final   Astrovirus NOT DETECTED NOT DETECTED  Final   Norovirus GI/GII NOT DETECTED NOT DETECTED Final   Rotavirus A NOT DETECTED NOT DETECTED Final   Sapovirus (I, II, IV, and V) NOT DETECTED NOT DETECTED Final    Comment: Performed at [facility], [address] [place], [place] [address]    Radiology Studies: CT ABDOMEN PELVIS WO CONTRAST  Result Date: 09/23/2022 CLINICAL DATA:  Abdominal pain, diarrhea EXAM: CT ABDOMEN AND PELVIS WITHOUT CONTRAST TECHNIQUE: Multidetector CT imaging of the abdomen and pelvis was performed following the standard protocol without IV contrast. RADIATION DOSE REDUCTION: This exam was performed according to the departmental dose-optimization program which includes automated exposure control, adjustment of the mA and/or kV according to patient size and/or use of iterative reconstruction technique. COMPARISON:  11/16/2021 FINDINGS: Lower chest: Heart is enlarged in size. Coronary artery calcifications are seen. There is slight increase in interstitial markings in the periphery of lower lung fields, possibly minimal scarring or passive congestive change. Metallic sutures are seen in the sternum. Hepatobiliary: There is no dilation of bile ducts. Surgical clips are seen in gallbladder fossa. Pancreas: No focal abnormalities are seen. Spleen: Unremarkable. Adrenals/Urinary Tract: Adrenals are unremarkable. There is no hydronephrosis. There is 5.3 cm cyst in the upper pole of right kidney. There are foci of cortical thinning in the kidneys. There are  scattered calcifications in the renal artery branches. There is 2 mm calcific density in the midportion of right kidney, possibly nonobstructing renal stone or calcification in a small renal artery branch. Ureters are not dilated. There is contrast in the lumen of the bladder suggesting recent contrast administration. Stomach/Bowel: Small hiatal hernia is seen. Stomach is not distended. Small bowel loops are not dilated. Appendix is not dilated. There is mild diffuse wall thickening in sigmoid colon. Scattered diverticula are seen in colon. There is no loculated pericolic fluid collection. Vascular/Lymphatic: There is infrarenal aortic aneurysm measuring 5.7 cm with no significant interval change. There is previous aorto bi-iliac stent graft. Extensive calcifications are seen in aorta and its major branches. Reproductive: There are coarse calcifications in prostate. Prostate is enlarged projecting into the base of the bladder. Other: There is no ascites or pneumoperitoneum. Gynecomastia is seen in both breasts. Musculoskeletal: Degenerative changes are noted in lumbar spine. IMPRESSION: There is no evidence of intestinal obstruction or pneumoperitoneum. Appendix is not dilated. There is no hydronephrosis. There is diffuse wall thickening in sigmoid colon suggesting inflammatory or infectious colitis. Scattered diverticula are seen in colon without signs of focal acute diverticulitis. Small hiatal hernia. Right renal cysts. Aortic atherosclerosis. There is aneurism in infrarenal abdominal aorta with no significant interval change. There is previous aorto bi-iliac stent graft. There is subtle interval increase in interstitial markings in the periphery of both lower lung fields suggesting possible interstitial pneumonia or scarring. Coronary artery calcifications are seen. Other findings as described in the body of the report. Electronically Signed   By: Elmer Picker M.D.   On: 09/23/2022 15:12      Clotee Schlicker T.  [place]  If 7PM-7AM, please contact night-coverage [email or web address] 09/24/2022, 11:34 AM

## 2022-09-24 NOTE — Progress Notes (Signed)
  Transition of Care [facility]) Screening Note   Patient Details  Name: [name] Date of Birth: [birth year]   Transition of Care [facility]) CM/SW Contact:    Vassie Moselle, [place] Phone Number: 09/24/2022, 3:44 PM    Transition of Care Department [facility]) has reviewed patient and no TOC needs have been identified at this time. We will continue to monitor patient advancement through interdisciplinary progression rounds. If new patient transition needs arise, please place a TOC consult.
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
## 2022-09-24 NOTE — Progress Notes (Signed)
Mobility Specialist - Progress Note   09/24/22 1056  Mobility  Activity Ambulated with assistance in room  Level of Assistance Contact guard assist, steadying assist  Assistive Device Front wheel walker  Distance Ambulated (ft) 30 ft  Range of Motion/Exercises Active  Activity Response Tolerated well  Mobility Referral Yes  $Mobility charge 1 Mobility   Pt was found in bed and agreeable to ambulate. Pt stated not using an AD at baseline but when getting up from sitting EOB to standing was unsteady. Pt was advised to use the RW due to being unsteady and he agreed. Pt became very fatigued after session and at EOS returned to bed with necessities in reach.  Ferd Hibbs Mobility Specialist

## 2022-09-24 NOTE — Evaluation (Signed)
Physical Therapy Evaluation Patient Details Name: [name] MRN: [id number] DOB: May 19, 1946 Today's Date: 09/24/2022  History of Present Illness  76 y.o. male with PMH of IBS with diarrhea, duodenal ulcer, CVA, tardive dyskinesia, seizure disorder, PTSD, prolonged QT, gait abnormality, DM-2, diastolic CHF, CAD/CABG, orthostatic hypotension, BPH and AAA s/p aorto bi-iliac stent graft presenting with 2 days of abdominal pain and diarrhea.  Clinical Impression  Pt admitted with above diagnosis.  Pt is slightly weaker than his baseline, balance improved with RW support; pt does not like to use a device at home per wife; will follow in acute setting, do not think pt will need f/u at d/c. Should continue to mobilize with PT and mobility team  Pt currently with functional limitations due to the deficits listed below (see PT Problem List). Pt will benefit from skilled PT to increase their independence and safety with mobility to allow discharge to the venue listed below.          Recommendations for follow up therapy are one component of a multi-disciplinary discharge planning process, led by the attending physician.  Recommendations may be updated based on patient status, additional functional criteria and insurance authorization.  Follow Up Recommendations No PT follow up      Assistance Recommended at Discharge Frequent or constant Supervision/Assistance (depending on cognition)  Patient can return home with the following  Help with stairs or ramp for entrance;Assist for transportation;Assistance with cooking/housework    Equipment Recommendations None recommended by PT  Recommendations for Other Services       Functional Status Assessment Patient has had a recent decline in their functional status and demonstrates the ability to make significant improvements in function in a reasonable and predictable amount of time.     Precautions / Restrictions Precautions Precautions:  Fall Restrictions Weight Bearing Restrictions: No      Mobility  Bed Mobility Overal bed mobility: Needs Assistance Bed Mobility: Supine to Sit, Sit to Supine     Supine to sit: Min assist, Min guard     General bed mobility comments: assist to initiate trunk elevation, pt then able to complete of his own volition    Transfers Overall transfer level: Needs assistance Equipment used: Rolling walker (2 wheels) Transfers: Sit to/from Stand Sit to Stand: Min guard           General transfer comment: cues for hand placement    Ambulation/Gait Ambulation/Gait assistance: Min guard, Supervision Gait Distance (Feet): 150 Feet Assistive device: Rolling walker (2 wheels) Gait Pattern/deviations: Step-through pattern, Decreased stride length       General Gait Details: cues for RW position, posture  Stairs            Wheelchair Mobility    Modified Rankin (Stroke Patients Only)       Balance Overall balance assessment: Needs assistance Sitting-balance support: No upper extremity supported, Feet supported Sitting balance-Leahy Scale: Good     Standing balance support: During functional activity, Reliant on assistive device for balance Standing balance-Leahy Scale: Fair                               Pertinent Vitals/Pain Pain Assessment Pain Assessment: No/denies pain    Home Living Family/patient expects to be discharged to:: Private residence Living Arrangements: Spouse/significant other   Type of Home: House Home Access: Stairs to enter Entrance Stairs-Rails: Right Entrance Stairs-Number of Steps: 2   Home Layout: One level Home  Equipment: Conservation officer, nature (2 wheels);Rollator (4 wheels);Transport chair Additional Comments: spouse was taking care of her parents while pt lived alone; for the last few mos pt staying there also as spouse felt it was safer    Prior Function Prior Level of Function : Independent/Modified Independent              Mobility Comments: reports numerous falls over last few years; pt does not like to amb with device per wife       Hand Dominance        Extremity/Trunk Assessment   Upper Extremity Assessment Upper Extremity Assessment: Overall WFL for tasks assessed    Lower Extremity Assessment Lower Extremity Assessment: Overall WFL for tasks assessed    Cervical / Trunk Assessment Cervical / Trunk Assessment: Normal  Communication   Communication: No difficulties  Cognition Arousal/Alertness: Awake/alert Behavior During Therapy: WFL for tasks assessed/performed Overall Cognitive Status: Impaired/Different from baseline Area of Impairment: Memory                               General Comments: pt oriented to self, place; states his wife is his dtr        General Comments      Exercises     Assessment/Plan    PT Assessment Patient needs continued PT services  PT Problem List Decreased activity tolerance;Decreased balance;Decreased mobility       PT Treatment Interventions DME instruction;Therapeutic exercise;Gait training;Functional mobility training;Therapeutic activities;Patient/family education    PT Goals (Current goals can be found in the Care Plan section)  Acute Rehab PT Goals PT Goal Formulation: With patient Time For Goal Achievement: 10/08/22 Potential to Achieve Goals: Good    Frequency Min 3X/week     Co-evaluation               AM-PAC PT "6 Clicks" Mobility  Outcome Measure Help needed turning from your back to your side while in a flat bed without using bedrails?: A Little Help needed moving from lying on your back to sitting on the side of a flat bed without using bedrails?: A Little Help needed moving to and from a bed to a chair (including a wheelchair)?: A Little Help needed standing up from a chair using your arms (e.g., wheelchair or bedside chair)?: A Little Help needed to walk in hospital room?: A Little Help  needed climbing 3-5 steps with a railing? : A Little 6 Click Score: 18    End of Session Equipment Utilized During Treatment: Gait belt Activity Tolerance: Patient tolerated treatment well Patient left: with call bell/phone within reach;in chair;with chair alarm set;with family/visitor present   PT Visit Diagnosis: Other abnormalities of gait and mobility (R26.89);Difficulty in walking, not elsewhere classified (R26.2)    Time: 3419-3790 PT Time Calculation (min) (ACUTE ONLY): 13 min   Charges:   PT Evaluation $PT Eval Low Complexity: [place], PT  Acute Rehab Dept [facility]) [phone]  [facility] Weekend Pager [facility] only)  [phone]  09/24/2022   [facility] 09/24/2022, 3:41 PM

## 2022-09-25 ENCOUNTER — Encounter (HOSPITAL_COMMUNITY): Payer: Self-pay | Admitting: Student

## 2022-09-25 ENCOUNTER — Inpatient Hospital Stay (HOSPITAL_COMMUNITY): Payer: No Typology Code available for payment source

## 2022-09-25 DIAGNOSIS — N179 Acute kidney failure, unspecified: Secondary | ICD-10-CM | POA: Diagnosis not present

## 2022-09-25 DIAGNOSIS — E1169 Type 2 diabetes mellitus with other specified complication: Secondary | ICD-10-CM | POA: Diagnosis not present

## 2022-09-25 DIAGNOSIS — N1831 Chronic kidney disease, stage 3a: Secondary | ICD-10-CM | POA: Diagnosis not present

## 2022-09-25 DIAGNOSIS — Z8673 Personal history of transient ischemic attack (TIA), and cerebral infarction without residual deficits: Secondary | ICD-10-CM | POA: Diagnosis not present

## 2022-09-25 LAB — CBC
HCT: 29.8 % — ABNORMAL LOW (ref 39.0–52.0)
Hemoglobin: 9.6 g/dL — ABNORMAL LOW (ref 13.0–17.0)
MCH: 29.7 pg (ref 26.0–34.0)
MCHC: 32.2 g/dL (ref 30.0–36.0)
MCV: 92.3 fL (ref 80.0–100.0)
Platelets: 158 10*3/uL (ref 150–400)
RBC: 3.23 MIL/uL — ABNORMAL LOW (ref 4.22–5.81)
RDW: 14.5 % (ref 11.5–15.5)
WBC: 11.7 10*3/uL — ABNORMAL HIGH (ref 4.0–10.5)
nRBC: 0 % (ref 0.0–0.2)

## 2022-09-25 LAB — BLOOD GAS, ARTERIAL
Acid-base deficit: 8.1 mmol/L — ABNORMAL HIGH (ref 0.0–2.0)
Bicarbonate: 15.1 mmol/L — ABNORMAL LOW (ref 20.0–28.0)
O2 Saturation: 97 %
Patient temperature: 36.1
pCO2 arterial: 24 mmHg — ABNORMAL LOW (ref 32–48)
pH, Arterial: 7.4 (ref 7.35–7.45)
pO2, Arterial: 78 mmHg — ABNORMAL LOW (ref 83–108)

## 2022-09-25 LAB — RENAL FUNCTION PANEL
Albumin: 3 g/dL — ABNORMAL LOW (ref 3.5–5.0)
Anion gap: 10 (ref 5–15)
BUN: 49 mg/dL — ABNORMAL HIGH (ref 8–23)
CO2: 16 mmol/L — ABNORMAL LOW (ref 22–32)
Calcium: 7.9 mg/dL — ABNORMAL LOW (ref 8.9–10.3)
Chloride: 113 mmol/L — ABNORMAL HIGH (ref 98–111)
Creatinine, Ser: 2.83 mg/dL — ABNORMAL HIGH (ref 0.61–1.24)
GFR, Estimated: 22 mL/min — ABNORMAL LOW (ref >60)
Glucose, Bld: 144 mg/dL — ABNORMAL HIGH (ref 70–99)
Phosphorus: 3.8 mg/dL (ref 2.5–4.6)
Potassium: 4.2 mmol/L (ref 3.5–5.1)
Sodium: 139 mmol/L (ref 135–145)

## 2022-09-25 LAB — VITAMIN B12: Vitamin B-12: 705 pg/mL (ref 180–914)

## 2022-09-25 LAB — FOLATE: Folate: 7.2 ng/mL (ref >5.9)

## 2022-09-25 LAB — CK: Total CK: 594 U/L — ABNORMAL HIGH (ref 49–397)

## 2022-09-25 LAB — RETICULOCYTES
Immature Retic Fract: 30.9 % — ABNORMAL HIGH (ref 2.3–15.9)
RBC.: 3.26 MIL/uL — ABNORMAL LOW (ref 4.22–5.81)
Retic Count, Absolute: 69.4 10*3/uL (ref 19.0–186.0)
Retic Ct Pct: 2.1 % (ref 0.4–3.1)

## 2022-09-25 LAB — MAGNESIUM: Magnesium: 1.9 mg/dL (ref 1.7–2.4)

## 2022-09-25 LAB — IRON AND TIBC
Iron: 18 ug/dL — ABNORMAL LOW (ref 45–182)
Saturation Ratios: 10 % — ABNORMAL LOW (ref 17.9–39.5)
TIBC: 179 ug/dL — ABNORMAL LOW (ref 250–450)
UIBC: 161 ug/dL

## 2022-09-25 LAB — FERRITIN: Ferritin: 278 ng/mL (ref 24–336)

## 2022-09-25 LAB — PROCALCITONIN: Procalcitonin: 8.19 ng/mL

## 2022-09-25 MED ORDER — RIVASTIGMINE 9.5 MG/24HR TD PT24
9.5000 mg | MEDICATED_PATCH | Freq: Every day | TRANSDERMAL | Status: DC
  Administered 2022-09-25 – 2022-09-30 (×6): 9.5 mg via TRANSDERMAL
  Filled 2022-09-25 (×6): qty 1

## 2022-09-25 MED ORDER — MIRTAZAPINE 15 MG PO TABS
45.0000 mg | ORAL_TABLET | Freq: Every day | ORAL | Status: DC
  Administered 2022-09-25 – 2022-09-29 (×5): 45 mg via ORAL
  Filled 2022-09-25 (×5): qty 3

## 2022-09-25 MED ORDER — FLUOXETINE HCL 20 MG PO CAPS
40.0000 mg | ORAL_CAPSULE | Freq: Every day | ORAL | Status: DC
  Administered 2022-09-25 – 2022-09-30 (×6): 40 mg via ORAL
  Filled 2022-09-25 (×6): qty 2

## 2022-09-25 MED ORDER — FUROSEMIDE 10 MG/ML IJ SOLN
20.0000 mg | Freq: Once | INTRAMUSCULAR | Status: AC
  Administered 2022-09-25: 20 mg via INTRAVENOUS
  Filled 2022-09-25: qty 2

## 2022-09-25 MED ORDER — SODIUM CHLORIDE 0.9 % IV SOLN
500.0000 mg | INTRAVENOUS | Status: DC
  Administered 2022-09-25 – 2022-09-27 (×3): 500 mg via INTRAVENOUS
  Filled 2022-09-25 (×4): qty 5

## 2022-09-25 MED ORDER — IPRATROPIUM-ALBUTEROL 0.5-2.5 (3) MG/3ML IN SOLN
3.0000 mL | Freq: Four times a day (QID) | RESPIRATORY_TRACT | Status: DC | PRN
  Administered 2022-09-25: 3 mL via RESPIRATORY_TRACT
  Filled 2022-09-25: qty 3
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
## 2022-09-25 NOTE — Plan of Care (Signed)

## 2022-09-25 NOTE — Progress Notes (Signed)
90 RN rounded on Pt, noted Pt to have labored breathing. Pt had moderate grip to bilateral hands, denies pain at this time. Due meds given, RT called for PRN breathing treatment. VS taken and recorded, Dr. Karleen Hampshire updated. New orders received.  Report called to [place], [place] Pt transferred to higher level of care (room#[id number]), son at bedside updated. Son states that he had updated his family regarding the transfer.

## 2022-09-25 NOTE — Plan of Care (Signed)
Patient alert lying in bed with covers pulled up.  Reports being cold, room temperature adjusted, discuss plan of care for evening, patient denies pain

## 2022-09-25 NOTE — Progress Notes (Signed)
Pt returned from CT. Wife at bedside.

## 2022-09-25 NOTE — Progress Notes (Signed)
[facility]                                                                               [name], is a 76 y.o. male, DOB - October 10, 1945, UXN:[id number] Admit date - 09/23/2022    Outpatient Primary MD for the patient is [facility], [place]  LOS - 2  days    Brief summary   76 y.o. male with PMH of IBS with diarrhea, duodenal ulcer, CVA, tardive dyskinesia, seizure disorder, PTSD, prolonged QT, gait abnormality, DM-2, diastolic CHF, CAD/CABG, orthostatic hypotension, BPH and AAA s/p aorto bi-iliac stent graft presenting with 2 days of abdominal pain and diarrhea, and admitted for AKI and sigmoid colitis.  Started on IV fluid, IV ceftriaxone and IV Flagyl.  Hemoccult positive.  C. difficile and GIP negative.  Hemoccult positive.  Improving.  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 Dehydration and diarrhea vs rhabdomyolysis.  CT A/P without obstruction.  Had to stop the IV fluids.  Continue to renal parameters.  Baseline creatinine around 1.6 to 2. He was admitted with a creatinine of 5.15 and creatinine improved to 2.83 today.  Sodium bicarbonate added.    Sigmoid colitis:  - improving diarrhea and abdominal pain.  - gently hydrated, able to tolerate diet.    Normocytic anemia/ iron deficiency anemia: - positive hemoccult. ? From colitis.  - continue with PPI.  - hemoglobin stable around 9.  - transfuse to keep hemoglobin greater than 7.  - recommend iron supplementation on discharge .  - anemia panel shows adequate b12 and folate.    H/o CVA:  - therapy eval    Seizure disorder; None this admission.    Tardive dyskinesia;  None this admission.    CAD s/p CABG  No chest pain.  Was on plavix. Resume it.     Acute diastolic CHF;  BNP elevated , bilateral pleural effusions, and dyspnea on exertion.  He denies any chest pain. Bilateral wheezing one exam. Duo neb  treatments.  CT chest without contrast showed  Moderate right and small left pleural effusions with adjacent compressive atelectasis in the lower lobes, new compared to 09/23/2022. Multifocal ground-glass consolidations in the upper lobes, favored to represent multifocal pneumonia. Mildly enlarged right paratracheal and right hilar nodes, likely reactive. Start the patient on low dose lasix, check strict intake and output.  [place] oxygen to keep sats greater than 90%.    Multifocal Pneumonia: Continue with IV rocephin and add IV zithromax for the multifocal pneumonia.  Will request SLP eval for aspiration pneumonia.  [place] oxygen to keep sats greater 90%.  Duonebs as needed.  Worsening respiratory status, tachypnea, but oxygen sats on RA 94%. Check  ABG.     Type 2 DM Not on meds.  Hemoglobin A1c ordered and pending.  Gabapentin for diabetic neuropathy.     AAA s/p aorto bi-iliac stent graft   BPH:  On finasteride.      RN Pressure Injury Documentation: Pressure Injury 09/23/22 Coccyx Mid Stage 1 -  Intact skin with non-blanchable redness of a  localized area usually over a bony prominence. (Active)  09/23/22 1816  Location: Coccyx  Location Orientation: Mid  Staging: Stage 1 -  Intact skin with non-blanchable redness of a localized area usually over a bony prominence.  Wound Description (Comments):   Present on Admission: Yes  Dressing Type None 09/25/22 1100   Foam dressing .      Estimated body mass index is 22.46 kg/m as calculated from the following:   Height as of this encounter: '5\' 9"'$  (1.753 m).   Weight as of this encounter: 69 kg.  Code Status: DNR DVT Prophylaxis:  SCDs Start: 09/23/22 1614   Level of Care: Level of care: Med-Surg Family Communication: Updated patient's Family at bedside and called daughter over the phone.   Disposition Plan:     Remains inpatient appropriate: IV antibiotics and IV lasix.   Procedures:  CT chest without contrast.   Echocardiogram.   Consultants:   None.   Antimicrobials:   Anti-infectives (From admission, onward)    Start     Dose/Rate Route Frequency Ordered Stop   09/25/22 1445  azithromycin (ZITHROMAX) 500 mg in sodium chloride 0.9 % 250 mL IVPB        500 mg 250 mL/hr over 60 Minutes Intravenous Every 24 hours 09/25/22 1357     09/23/22 1645  cefTRIAXone (ROCEPHIN) 2 g in sodium chloride 0.9 % 100 mL IVPB        2 g 200 mL/hr over 30 Minutes Intravenous Daily 09/23/22 1630 09/28/22 1359   09/23/22 1645  metroNIDAZOLE (FLAGYL) IVPB 500 mg        500 mg 100 mL/hr over 60 Minutes Intravenous 2 times daily 09/23/22 1630 09/28/22 2159        Medications  Scheduled Meds:  finasteride  5 mg Oral Daily   furosemide  20 mg Intravenous Once   gabapentin  100 mg Oral QHS   pantoprazole (PROTONIX) IV  40 mg Intravenous Q12H   pneumococcal 20-valent conjugate vaccine  0.5 mL Intramuscular Tomorrow-1000   sodium bicarbonate  650 mg Oral TID   Continuous Infusions:  azithromycin     cefTRIAXone (ROCEPHIN)  IV 2 g (09/25/22 1356)   metronidazole Stopped (09/25/22 1035)   PRN Meds:.acetaminophen **OR** acetaminophen, ipratropium-albuterol    Subjective:   [name] was seen and examined today.  Sob and wheezing on exam. He remains on RA.   Objective:   Vitals:   09/24/22 2030 09/25/22 0443 09/25/22 0900 09/25/22 1357  BP: (!) 137/55 (!) 162/77 (!) 142/73 (!) 140/70  Pulse: 94 100 (!) 107 96  Resp: '18 20 19 20  '$ Temp: 98.5 F (36.9 C) 97.8 F (36.6 C) 98.8 F (37.1 C) 98.7 F (37.1 C)  TempSrc: Oral Oral Oral Oral  SpO2: 95% 94% 94% 95%  Weight:      Height:        Intake/Output Summary (Last 24 hours) at 09/25/2022 1358 Last data filed at 09/25/2022 1229 Gross per 24 hour  Intake 2743.21 ml  Output 1220 ml  Net 1523.21 ml   Filed Weights   09/23/22 1023  Weight: 69 kg     Exam General exam: Appears calm and comfortable  Respiratory system: diminished air  entry at bases, scattered bilateral rhonchi. On RA,  Cardiovascular system: S1 & S2 heard, RRR. No JVD, murmurs,  Gastrointestinal system: Abdomen is nondistended, soft and nontender.  Central nervous system: Alert and oriented. No focal neurological deficits. Extremities: Symmetric 5 x 5 power. Skin: No  rashes, lesions or ulcers Psychiatry: Mood & affect appropriate.     Data Reviewed:  I have personally reviewed following labs and imaging studies   CBC Lab Results  Component Value Date   WBC 11.7 (H) 09/25/2022   RBC 3.23 (L) 09/25/2022   RBC 3.26 (L) 09/25/2022   HGB 9.6 (L) 09/25/2022   HCT 29.8 (L) 09/25/2022   MCV 92.3 09/25/2022   MCH 29.7 09/25/2022   PLT 158 09/25/2022   MCHC 32.2 09/25/2022   RDW 14.5 09/25/2022   LYMPHSABS 0.9 09/23/2022   MONOABS 1.2 (H) 09/23/2022   EOSABS 0.0 09/23/2022   BASOSABS 0.0 82/99/3716     Last metabolic panel Lab Results  Component Value Date   NA 139 09/25/2022   K 4.2 09/25/2022   CL 113 (H) 09/25/2022   CO2 16 (L) 09/25/2022   BUN 49 (H) 09/25/2022   CREATININE 2.83 (H) 09/25/2022   GLUCOSE 144 (H) 09/25/2022   GFRNONAA 22 (L) 09/25/2022   GFRAA 58 (L) 04/30/2016   CALCIUM 7.9 (L) 09/25/2022   PHOS 3.8 09/25/2022   PROT 6.9 09/23/2022   ALBUMIN 3.0 (L) 09/25/2022   LABGLOB 2.0 04/30/2016   AGRATIO 2.1 04/30/2016   BILITOT 0.7 09/23/2022   ALKPHOS 61 09/23/2022   AST 29 09/23/2022   ALT 21 09/23/2022   ANIONGAP 10 09/25/2022    CBG (last 3)  No results for input(s): "GLUCAP" in the last 72 hours.    Coagulation Profile: No results for input(s): "INR", "PROTIME" in the last 168 hours.   Radiology Studies: CT CHEST WO CONTRAST  Result Date: 09/25/2022 CLINICAL DATA:  Respiratory illness. New right suprahilar density on chest radiograph 09/24/2022. EXAM: CT CHEST WITHOUT CONTRAST TECHNIQUE: Multidetector CT imaging of the chest was performed following the standard protocol without IV contrast. RADIATION  DOSE REDUCTION: This exam was performed according to the departmental dose-optimization program which includes automated exposure control, adjustment of the mA and/or kV according to patient size and/or use of iterative reconstruction technique. COMPARISON:  Chest radiograph 09/24/2022; CT abdomen pelvis 09/23/2022 FINDINGS: Patient respiratory motion degrades image quality. Cardiovascular: Postoperative changes of coronary artery bypass graft. Aortic calcifications. Heart is normal in size. No pericardial effusion. Mediastinum/Nodes: Enlarged right paratracheal and right hilar nodes both measure 1.2 cm short axis (series 2, image 49, 64). No other mediastinal lymphadenopathy. No definite left hilar adenopathy, though evaluation is limited due to lack of intravenous contrast. No axillary lymphadenopathy. The trachea, thyroid gland, esophagus demonstrate no significant abnormalities. Lungs/Pleura: Moderate right and small left pleural effusions with adjacent compressive atelectasis in the lower lobes, new compared to 09/23/2022. Multifocal ground-glass consolidations in the upper lobes. No pneumothorax. Upper Abdomen: No acute abnormality. Cholecystectomy. Right renal cyst as described on recent CT abdomen pelvis exam. No follow-up imaging is indicated. Musculoskeletal: Postoperative changes of median sternotomy. Bilateral gynecomastia. Old fractures of the posterior left eighth through tenth ribs. No acute or suspicious osseous finding. IMPRESSION: 1. Moderate right and small left pleural effusions with adjacent compressive atelectasis in the lower lobes, new compared to 09/23/2022. 2. Multifocal ground-glass consolidations in the upper lobes, favored to represent multifocal pneumonia. 3. Mildly enlarged right paratracheal and right hilar nodes, likely reactive. 4. Aortic atherosclerosis. Aortic Atherosclerosis (ICD10-I70.0). Electronically Signed   By: Ileana Roup M.D.   On: 09/25/2022 12:56   DG Chest Port 1  View  Result Date: 09/24/2022 CLINICAL DATA:  Wheezing EXAM: PORTABLE CHEST 1 VIEW COMPARISON:  11/16/2021 FINDINGS: The cardio pericardial silhouette is  enlarged. There is pulmonary vascular congestion without overt pulmonary edema. New right suprahilar density identified which could be related to infection/inflammation although hilar lymphadenopathy. Central right lung lesion not excluded. No pleural effusion. The visualized bony structures of the thorax are unremarkable. IMPRESSION: New right suprahilar density which could be related to infection/inflammation although hilar lymphadenopathy not excluded. Central right lung lesion also a consideration. CT chest with contrast recommended to further evaluate. Electronically Signed   By: Misty Stanley M.D.   On: 09/24/2022 16:33   DG Abd Portable 1V  Result Date: 09/24/2022 CLINICAL DATA:  Abdominal distention EXAM: PORTABLE ABDOMEN - 1 VIEW COMPARISON:  None Available. FINDINGS: Mildly distended prominent loops of small and large bowel. Bibasilar atelectasis. Aorto bi-iliac stent. No acute osseous abnormality. IMPRESSION: Mildly distended loops of small and large bowel, may represent ileus. Electronically Signed   By: Yetta Glassman M.D.   On: 09/24/2022 16:22   CT ABDOMEN PELVIS WO CONTRAST  Result Date: 09/23/2022 CLINICAL DATA:  Abdominal pain, diarrhea EXAM: CT ABDOMEN AND PELVIS WITHOUT CONTRAST TECHNIQUE: Multidetector CT imaging of the abdomen and pelvis was performed following the standard protocol without IV contrast. RADIATION DOSE REDUCTION: This exam was performed according to the departmental dose-optimization program which includes automated exposure control, adjustment of the mA and/or kV according to patient size and/or use of iterative reconstruction technique. COMPARISON:  11/16/2021 FINDINGS: Lower chest: Heart is enlarged in size. Coronary artery calcifications are seen. There is slight increase in interstitial markings in the  periphery of lower lung fields, possibly minimal scarring or passive congestive change. Metallic sutures are seen in the sternum. Hepatobiliary: There is no dilation of bile ducts. Surgical clips are seen in gallbladder fossa. Pancreas: No focal abnormalities are seen. Spleen: Unremarkable. Adrenals/Urinary Tract: Adrenals are unremarkable. There is no hydronephrosis. There is 5.3 cm cyst in the upper pole of right kidney. There are foci of cortical thinning in the kidneys. There are scattered calcifications in the renal artery branches. There is 2 mm calcific density in the midportion of right kidney, possibly nonobstructing renal stone or calcification in a small renal artery branch. Ureters are not dilated. There is contrast in the lumen of the bladder suggesting recent contrast administration. Stomach/Bowel: Small hiatal hernia is seen. Stomach is not distended. Small bowel loops are not dilated. Appendix is not dilated. There is mild diffuse wall thickening in sigmoid colon. Scattered diverticula are seen in colon. There is no loculated pericolic fluid collection. Vascular/Lymphatic: There is infrarenal aortic aneurysm measuring 5.7 cm with no significant interval change. There is previous aorto bi-iliac stent graft. Extensive calcifications are seen in aorta and its major branches. Reproductive: There are coarse calcifications in prostate. Prostate is enlarged projecting into the base of the bladder. Other: There is no ascites or pneumoperitoneum. Gynecomastia is seen in both breasts. Musculoskeletal: Degenerative changes are noted in lumbar spine. IMPRESSION: There is no evidence of intestinal obstruction or pneumoperitoneum. Appendix is not dilated. There is no hydronephrosis. There is diffuse wall thickening in sigmoid colon suggesting inflammatory or infectious colitis. Scattered diverticula are seen in colon without signs of focal acute diverticulitis. Small hiatal hernia. Right renal cysts. Aortic  atherosclerosis. There is aneurism in infrarenal abdominal aorta with no significant interval change. There is previous aorto bi-iliac stent graft. There is subtle interval increase in interstitial markings in the periphery of both lower lung fields suggesting possible interstitial pneumonia or scarring. Coronary artery calcifications are seen. Other findings as described in the body of the report.  Electronically Signed   By: Elmer Picker M.D.   On: 09/23/2022 15:12       [name] M.D. [facility] 09/25/2022, 1:58 PM  Available via Epic secure chat 7am-7pm After 7 pm, please refer to night coverage provider listed on amion.

## 2022-09-26 ENCOUNTER — Inpatient Hospital Stay (HOSPITAL_COMMUNITY): Payer: No Typology Code available for payment source

## 2022-09-26 DIAGNOSIS — R0609 Other forms of dyspnea: Secondary | ICD-10-CM

## 2022-09-26 DIAGNOSIS — E1169 Type 2 diabetes mellitus with other specified complication: Secondary | ICD-10-CM | POA: Diagnosis not present

## 2022-09-26 DIAGNOSIS — N1831 Chronic kidney disease, stage 3a: Secondary | ICD-10-CM | POA: Diagnosis not present

## 2022-09-26 DIAGNOSIS — Z8673 Personal history of transient ischemic attack (TIA), and cerebral infarction without residual deficits: Secondary | ICD-10-CM | POA: Diagnosis not present

## 2022-09-26 DIAGNOSIS — N179 Acute kidney failure, unspecified: Secondary | ICD-10-CM | POA: Diagnosis not present

## 2022-09-26 LAB — ECHOCARDIOGRAM COMPLETE
AR max vel: 4.6 cm2
AV Area VTI: 4.07 cm2
AV Area mean vel: 4.32 cm2
AV Mean grad: 3 mmHg
AV Peak grad: 5 mmHg
Ao pk vel: 1.12 m/s
Area-P 1/2: 6.17 cm2
Calc EF: 31.6 %
Height: 69 in
MV M vel: 2.63 m/s
MV Peak grad: 27.6 mmHg
S' Lateral: 4.5 cm
Single Plane A2C EF: 31.3 %
Single Plane A4C EF: 34.7 %
Weight: 2433.88 oz

## 2022-09-26 LAB — LACTIC ACID, PLASMA: Lactic Acid, Venous: 1.1 mmol/L (ref 0.5–1.9)

## 2022-09-26 LAB — CBC WITH DIFFERENTIAL/PLATELET
Abs Immature Granulocytes: 0.05 10*3/uL (ref 0.00–0.07)
Basophils Absolute: 0 10*3/uL (ref 0.0–0.1)
Basophils Relative: 0 %
Eosinophils Absolute: 0.1 10*3/uL (ref 0.0–0.5)
Eosinophils Relative: 1 %
HCT: 30.2 % — ABNORMAL LOW (ref 39.0–52.0)
Hemoglobin: 9.8 g/dL — ABNORMAL LOW (ref 13.0–17.0)
Immature Granulocytes: 1 %
Lymphocytes Relative: 8 %
Lymphs Abs: 0.7 10*3/uL (ref 0.7–4.0)
MCH: 30.1 pg (ref 26.0–34.0)
MCHC: 32.5 g/dL (ref 30.0–36.0)
MCV: 92.6 fL (ref 80.0–100.0)
Monocytes Absolute: 0.9 10*3/uL (ref 0.1–1.0)
Monocytes Relative: 11 %
Neutro Abs: 7 10*3/uL (ref 1.7–7.7)
Neutrophils Relative %: 79 %
Platelets: 158 10*3/uL (ref 150–400)
RBC: 3.26 MIL/uL — ABNORMAL LOW (ref 4.22–5.81)
RDW: 14.9 % (ref 11.5–15.5)
WBC: 8.8 10*3/uL (ref 4.0–10.5)
nRBC: 0 % (ref 0.0–0.2)

## 2022-09-26 LAB — BASIC METABOLIC PANEL
Anion gap: 9 (ref 5–15)
BUN: 45 mg/dL — ABNORMAL HIGH (ref 8–23)
CO2: 18 mmol/L — ABNORMAL LOW (ref 22–32)
Calcium: 8 mg/dL — ABNORMAL LOW (ref 8.9–10.3)
Chloride: 116 mmol/L — ABNORMAL HIGH (ref 98–111)
Creatinine, Ser: 2.42 mg/dL — ABNORMAL HIGH (ref 0.61–1.24)
GFR, Estimated: 27 mL/min — ABNORMAL LOW (ref >60)
Glucose, Bld: 121 mg/dL — ABNORMAL HIGH (ref 70–99)
Potassium: 4 mmol/L (ref 3.5–5.1)
Sodium: 143 mmol/L (ref 135–145)

## 2022-09-26 LAB — PROCALCITONIN: Procalcitonin: 5.78 ng/mL

## 2022-09-26 MED ORDER — SODIUM CHLORIDE 0.9 % IV SOLN: INTRAVENOUS | Status: DC | PRN

## 2022-09-26 MED ORDER — FUROSEMIDE 10 MG/ML IJ SOLN
20.0000 mg | Freq: Once | INTRAMUSCULAR | Status: AC
  Administered 2022-09-26: 20 mg via INTRAVENOUS
  Filled 2022-09-26: qty 2

## 2022-09-26 MED ORDER — PERFLUTREN LIPID MICROSPHERE
1.0000 mL | INTRAVENOUS | Status: AC | PRN
  Administered 2022-09-26: 2 mL via INTRAVENOUS

## 2022-09-26 NOTE — Progress Notes (Signed)
[facility]                                                                               Con Arganbright, is a 76 y.o. male, DOB - [birth year], UVO:[id number] Admit date - 09/23/2022    Outpatient Primary MD for the patient is [facility] [place]  LOS - 3  days    Brief summary   76 y.o. male with PMH of IBS with diarrhea, duodenal ulcer, CVA, tardive dyskinesia, seizure disorder, PTSD, prolonged QT, gait abnormality, DM-2, diastolic CHF, CAD/CABG, orthostatic hypotension, BPH and AAA s/p aorto bi-iliac stent graft presenting with 2 days of abdominal pain and diarrhea, and admitted for AKI and sigmoid colitis.  Started on IV fluid, IV ceftriaxone and IV Flagyl.  Hemoccult positive.  C. difficile and GIP negative.  Hemoccult positive.  Improving.  GI on board and recommended outpatient EGD/ colonoscopy.     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 Dehydration and diarrhea vs rhabdomyolysis.  CT A/P without obstruction.  Had to stop the IV fluids due to bilateral pleural effusion.  Continue to check  renal parameters.  Baseline creatinine around 1.6 to 2. He was admitted with a creatinine of 5.15 and creatinine improved to 2.83  to 2.42 today.  Sodium bicarbonate added for acidosis and bicarb improved to 18 today.    Sigmoid colitis:  - improving diarrhea and abdominal pain.  - advanced diet to soft and is able to tolerate without any nausea, vomiting or abdominal pain.  - GI consulted and recommended outpatient EGD/ colonoscopy in 6 to 8 weeks.    Normocytic anemia/ iron deficiency anemia: - positive hemoccult. ? From colitis.  - continue with PPI.  - hemoglobin stable around 9.  - transfuse to keep hemoglobin greater than 7.  - recommend iron supplementation on discharge .  - anemia panel shows adequate b12 and folate.    H/o CVA:  - therapy evaluation ordered and pending.    Seizure  disorder; None this admission.    Tardive dyskinesia;  None this admission.    CAD s/p CABG  No chest pain.  Was on plavix. Resume it.     Acute systolic heart failure.  BNP elevated , bilateral pleural effusions, and dyspnea on exertion.  He denies any chest pain and sob today.  CT chest without contrast showed  Moderate right and small left pleural effusions with adjacent compressive atelectasis in the lower lobes, new compared to 09/23/2022. Multifocal ground-glass consolidations in the upper lobes, favored to represent multifocal pneumonia. Mildly enlarged right paratracheal and right hilar nodes, likely reactive.  Echocardiogram ordered and showed Global hypokinesis worse in the inferior base and septum . Left ventricular ejection fraction, is 30 to 35%. The left ventricle has moderately decreased function. The left ventricle demonstrates global hypokinesis. The left ventricular internal cavity size was mildly dilated.  Start the patient on low dose lasix, check strict intake and output.  [place] oxygen to keep sats greater than 90%. Patient currently on RA.    Multifocal Pneumonia: Continue with IV rocephin and add IV zithromax for the  multifocal pneumonia.  Will request SLP eval for aspiration pneumonia.  [place] oxygen to keep sats greater 90%.  Duonebs as needed.  Patient is on RA and stable.     Type 2 DM Not on meds.  Hemoglobin A1c ordered and pending.  Gabapentin for diabetic neuropathy.     AAA s/p aorto bi-iliac stent graft   BPH:  On finasteride.      RN Pressure Injury Documentation: Pressure Injury 09/23/22 Coccyx Mid Stage 1 -  Intact skin with non-blanchable redness of a localized area usually over a bony prominence. (Active)  09/23/22 1816  Location: Coccyx  Location Orientation: Mid  Staging: Stage 1 -  Intact skin with non-blanchable redness of a localized area usually over a bony prominence.  Wound Description (Comments):   Present on Admission:  Yes  Dressing Type Foam - Lift dressing to assess site every shift 09/26/22 0820   Foam dressing .      Estimated body mass index is 22.46 kg/m as calculated from the following:   Height as of this encounter: '5\' 9"'$  (1.753 m).   Weight as of this encounter: 69 kg.  Code Status: DNR DVT Prophylaxis:  SCDs Start: 09/23/22 1614   Level of Care: Level of care: Progressive Family Communication: Updated patient's Family at bedside  Disposition Plan:     Remains inpatient appropriate: IV antibiotics and IV lasix.   Procedures:  CT chest without contrast.  Echocardiogram.   Consultants:   None.   Antimicrobials:   Anti-infectives (From admission, onward)    Start     Dose/Rate Route Frequency Ordered Stop   09/25/22 1445  azithromycin (ZITHROMAX) 500 mg in sodium chloride 0.9 % 250 mL IVPB        500 mg 250 mL/hr over 60 Minutes Intravenous Every 24 hours 09/25/22 1357     09/23/22 1645  cefTRIAXone (ROCEPHIN) 2 g in sodium chloride 0.9 % 100 mL IVPB        2 g 200 mL/hr over 30 Minutes Intravenous Daily 09/23/22 1630 09/28/22 1359   09/23/22 1645  metroNIDAZOLE (FLAGYL) IVPB 500 mg        500 mg 100 mL/hr over 60 Minutes Intravenous 2 times daily 09/23/22 1630 09/28/22 2159        Medications  Scheduled Meds:  finasteride  5 mg Oral Daily   FLUoxetine  40 mg Oral Daily   gabapentin  100 mg Oral QHS   mirtazapine  45 mg Oral QHS   pantoprazole (PROTONIX) IV  40 mg Intravenous Q12H   pneumococcal 20-valent conjugate vaccine  0.5 mL Intramuscular Tomorrow-1000   rivastigmine  9.5 mg Transdermal Daily   sodium bicarbonate  650 mg Oral TID   Continuous Infusions:  sodium chloride Stopped (09/26/22 1022)   azithromycin 500 mg (09/26/22 1344)   cefTRIAXone (ROCEPHIN)  IV 2 g (09/25/22 1356)   metronidazole 500 mg (09/26/22 0824)   PRN Meds:.sodium chloride, acetaminophen **OR** acetaminophen, ipratropium-albuterol    Subjective:   Ark Agrusa was seen and  examined today.  Patient looks a lot better today, no diarrhea this morning. No chest pain or sob.    Objective:   Vitals:   09/25/22 2046 09/26/22 0349 09/26/22 0820 09/26/22 1249  BP: 139/73 137/74 (!) 148/78 (!) 148/79  Pulse: 96 (!) 105 (!) 103 (!) 102  Resp: '20 20 18 17  '$ Temp: 99 F (37.2 C) 98.9 F (37.2 C)  99.2 F (37.3 C)  TempSrc: Oral Oral  Oral  SpO2:  97% 95% 99% 98%  Weight:      Height:        Intake/Output Summary (Last 24 hours) at 09/26/2022 1405 Last data filed at 09/26/2022 1344 Gross per 24 hour  Intake 360 ml  Output 2525 ml  Net -2165 ml    Filed Weights   09/23/22 1023  Weight: 69 kg     Exam General exam: Appears calm and comfortable  Respiratory system: Clear to auscultation. Respiratory effort normal. Cardiovascular system: S1 & S2 heard, RRR. No JVD, murmurs, Gastrointestinal system: Abdomen is nondistended, soft and nontender.  Central nervous system: Alert and oriented. No focal neurological deficits. Extremities: Symmetric 5 x 5 power. Skin: No rashes,  Psychiatry:  Mood & affect appropriate.     Data Reviewed:  I have personally reviewed following labs and imaging studies   CBC Lab Results  Component Value Date   WBC 8.8 09/26/2022   RBC 3.26 (L) 09/26/2022   HGB 9.8 (L) 09/26/2022   HCT 30.2 (L) 09/26/2022   MCV 92.6 09/26/2022   MCH 30.1 09/26/2022   PLT 158 09/26/2022   MCHC 32.5 09/26/2022   RDW 14.9 09/26/2022   LYMPHSABS 0.7 09/26/2022   MONOABS 0.9 09/26/2022   EOSABS 0.1 09/26/2022   BASOSABS 0.0 36/64/4034     Last metabolic panel Lab Results  Component Value Date   NA 143 09/26/2022   K 4.0 09/26/2022   CL 116 (H) 09/26/2022   CO2 18 (L) 09/26/2022   BUN 45 (H) 09/26/2022   CREATININE 2.42 (H) 09/26/2022   GLUCOSE 121 (H) 09/26/2022   GFRNONAA 27 (L) 09/26/2022   GFRAA 58 (L) 04/30/2016   CALCIUM 8.0 (L) 09/26/2022   PHOS 3.8 09/25/2022   PROT 6.9 09/23/2022   ALBUMIN 3.0 (L) 09/25/2022    LABGLOB 2.0 04/30/2016   AGRATIO 2.1 04/30/2016   BILITOT 0.7 09/23/2022   ALKPHOS 61 09/23/2022   AST 29 09/23/2022   ALT 21 09/23/2022   ANIONGAP 9 09/26/2022    CBG (last 3)  No results for input(s): "GLUCAP" in the last 72 hours.    Coagulation Profile: No results for input(s): "INR", "PROTIME" in the last 168 hours.   Radiology Studies: ECHOCARDIOGRAM COMPLETE  Result Date: 09/26/2022    ECHOCARDIOGRAM REPORT   Patient Name:   RIESE HELLARD Date of Exam: 09/26/2022 Medical Rec #:  [id number]       Height:       69.0 in Accession #:    [id number]      Weight:       152.1 lb Date of Birth:  [birth year]       BSA:          1.839 m Patient Age:    43 years        BP:           137/74 mmHg Patient Gender: M               HR:           104 bpm. Exam Location:  Inpatient Procedure: 2D Echo and Intracardiac Opacification Agent Indications:    dyspnea  History:        Patient has prior history of Echocardiogram examinations, most                 recent 12/07/2011. CAD, Stroke; Risk Factors:Hypertension,                 Diabetes and Dyslipidemia.  Sonographer:  Harvie Junior Referring Phys: Theola Sequin  Sonographer Comments: Technically difficult study due to poor echo windows. IMPRESSIONS  1. Global hypokinesis worse in the inferior base and septum . Left ventricular ejection fraction, by estimation, is 30 to 35%. The left ventricle has moderately decreased function. The left ventricle demonstrates global hypokinesis. The left ventricular  internal cavity size was mildly dilated. Left ventricular diastolic parameters were normal.  2. Right ventricular systolic function is normal. The right ventricular size is normal.  3. The mitral valve is abnormal. Trivial mitral valve regurgitation. No evidence of mitral stenosis.  4. The aortic valve is tricuspid. There is mild calcification of the aortic valve. There is mild thickening of the aortic valve. Aortic valve regurgitation is not visualized.  Aortic valve sclerosis is present, with no evidence of aortic valve stenosis.  5. The inferior vena cava is normal in size with greater than 50% respiratory variability, suggesting right atrial pressure of 3 mmHg. FINDINGS  Left Ventricle: Global hypokinesis worse in the inferior base and septum. Left ventricular ejection fraction, by estimation, is 30 to 35%. The left ventricle has moderately decreased function. The left ventricle demonstrates global hypokinesis. Definity  contrast agent was given IV to delineate the left ventricular endocardial borders. The left ventricular internal cavity size was mildly dilated. There is no left ventricular hypertrophy. Left ventricular diastolic parameters were normal. Right Ventricle: The right ventricular size is normal. No increase in right ventricular wall thickness. Right ventricular systolic function is normal. Left Atrium: Left atrial size was normal in size. Right Atrium: Right atrial size was normal in size. Pericardium: There is no evidence of pericardial effusion. Mitral Valve: The mitral valve is abnormal. There is mild thickening of the mitral valve leaflet(s). There is mild calcification of the mitral valve leaflet(s). Mild mitral annular calcification. Trivial mitral valve regurgitation. No evidence of mitral valve stenosis. Tricuspid Valve: The tricuspid valve is normal in structure. Tricuspid valve regurgitation is mild . No evidence of tricuspid stenosis. Aortic Valve: The aortic valve is tricuspid. There is mild calcification of the aortic valve. There is mild thickening of the aortic valve. Aortic valve regurgitation is not visualized. Aortic valve sclerosis is present, with no evidence of aortic valve stenosis. Aortic valve mean gradient measures 3.0 mmHg. Aortic valve peak gradient measures 5.0 mmHg. Aortic valve area, by VTI measures 4.07 cm. Pulmonic Valve: The pulmonic valve was normal in structure. Pulmonic valve regurgitation is not visualized. No  evidence of pulmonic stenosis. Aorta: The aortic root is normal in size and structure. Venous: The inferior vena cava is normal in size with greater than 50% respiratory variability, suggesting right atrial pressure of 3 mmHg. IAS/Shunts: The interatrial septum was not well visualized.  LEFT VENTRICLE PLAX 2D LVIDd:         5.60 cm      Diastology LVIDs:         4.50 cm      LV e' medial:    11.10 cm/s LV PW:         1.10 cm      LV E/e' medial:  10.1 LV IVS:        1.10 cm      LV e' lateral:   9.03 cm/s LVOT diam:     2.40 cm      LV E/e' lateral: 12.4 LV SV:         70 LV SV Index:   38 LVOT Area:     4.52 cm  LV Volumes (MOD) LV vol d, MOD A2C: 182.0 ml LV vol d, MOD A4C: 141.0 ml LV vol s, MOD A2C: 125.0 ml LV vol s, MOD A4C: 92.1 ml LV SV MOD A2C:     57.0 ml LV SV MOD A4C:     141.0 ml LV SV MOD BP:      50.7 ml RIGHT VENTRICLE RV Basal diam:  4.30 cm RV Mid diam:    3.90 cm RV S prime:     11.30 cm/s TAPSE (M-mode): 1.6 cm LEFT ATRIUM             Index        RIGHT ATRIUM           Index LA diam:        3.40 cm 1.85 cm/m   RA Area:     17.10 cm LA Vol (A2C):   46.9 ml 25.50 ml/m  RA Volume:   47.50 ml  25.83 ml/m LA Vol (A4C):   49.8 ml 27.08 ml/m LA Biplane Vol: 49.9 ml 27.13 ml/m  AORTIC VALVE                    PULMONIC VALVE AV Area (Vmax):    4.60 cm     PV Vmax:       0.83 m/s AV Area (Vmean):   4.32 cm     PV Peak grad:  2.7 mmHg AV Area (VTI):     4.07 cm AV Vmax:           112.00 cm/s AV Vmean:          77.200 cm/s AV VTI:            0.171 m AV Peak Grad:      5.0 mmHg AV Mean Grad:      3.0 mmHg LVOT Vmax:         114.00 cm/s LVOT Vmean:        73.800 cm/s LVOT VTI:          0.154 m LVOT/AV VTI ratio: 0.90  AORTA Ao Root diam: 3.40 cm MITRAL VALVE MV Area (PHT): 6.17 cm     SHUNTS MV Decel Time: 123 msec     Systemic VTI:  0.15 m MR Peak grad: 27.6 mmHg     Systemic Diam: 2.40 cm MR Vmax:      262.50 cm/s MV E velocity: 112.00 cm/s MV A velocity: 42.70 cm/s MV E/A ratio:  2.62 Jenkins Rouge MD Electronically signed by Jenkins Rouge MD Signature Date/Time: 09/26/2022/9:31:17 AM    Final    US Abdomen Limited  Result Date: 09/25/2022 CLINICAL DATA:  Ascites. EXAM: ULTRASOUND ABDOMEN LIMITED COMPARISON:  CT abdomen pelvis 09/23/2022 FINDINGS: Sonographic survey of the abdomen was performed.  No free fluid. IMPRESSION: No ascites on sonographic survey of the abdomen. Electronically Signed   By: Ileana Roup M.D.   On: 09/25/2022 14:41   CT CHEST WO CONTRAST  Result Date: 09/25/2022 CLINICAL DATA:  Respiratory illness. New right suprahilar density on chest radiograph 09/24/2022. EXAM: CT CHEST WITHOUT CONTRAST TECHNIQUE: Multidetector CT imaging of the chest was performed following the standard protocol without IV contrast. RADIATION DOSE REDUCTION: This exam was performed according to the departmental dose-optimization program which includes automated exposure control, adjustment of the mA and/or kV according to patient size and/or use of iterative reconstruction technique. COMPARISON:  Chest radiograph 09/24/2022; CT abdomen pelvis 09/23/2022 FINDINGS: Patient respiratory motion degrades image quality. Cardiovascular:  Postoperative changes of coronary artery bypass graft. Aortic calcifications. Heart is normal in size. No pericardial effusion. Mediastinum/Nodes: Enlarged right paratracheal and right hilar nodes both measure 1.2 cm short axis (series 2, image 49, 64). No other mediastinal lymphadenopathy. No definite left hilar adenopathy, though evaluation is limited due to lack of intravenous contrast. No axillary lymphadenopathy. The trachea, thyroid gland, esophagus demonstrate no significant abnormalities. Lungs/Pleura: Moderate right and small left pleural effusions with adjacent compressive atelectasis in the lower lobes, new compared to 09/23/2022. Multifocal ground-glass consolidations in the upper lobes. No pneumothorax. Upper Abdomen: No acute abnormality. Cholecystectomy. Right  renal cyst as described on recent CT abdomen pelvis exam. No follow-up imaging is indicated. Musculoskeletal: Postoperative changes of median sternotomy. Bilateral gynecomastia. Old fractures of the posterior left eighth through tenth ribs. No acute or suspicious osseous finding. IMPRESSION: 1. Moderate right and small left pleural effusions with adjacent compressive atelectasis in the lower lobes, new compared to 09/23/2022. 2. Multifocal ground-glass consolidations in the upper lobes, favored to represent multifocal pneumonia. 3. Mildly enlarged right paratracheal and right hilar nodes, likely reactive. 4. Aortic atherosclerosis. Aortic Atherosclerosis (ICD10-I70.0). Electronically Signed   By: Ileana Roup M.D.   On: 09/25/2022 12:56   DG Chest Port 1 View  Result Date: 09/24/2022 CLINICAL DATA:  Wheezing EXAM: PORTABLE CHEST 1 VIEW COMPARISON:  11/16/2021 FINDINGS: The cardio pericardial silhouette is enlarged. There is pulmonary vascular congestion without overt pulmonary edema. New right suprahilar density identified which could be related to infection/inflammation although hilar lymphadenopathy. Central right lung lesion not excluded. No pleural effusion. The visualized bony structures of the thorax are unremarkable. IMPRESSION: New right suprahilar density which could be related to infection/inflammation although hilar lymphadenopathy not excluded. Central right lung lesion also a consideration. CT chest with contrast recommended to further evaluate. Electronically Signed   By: Misty Stanley M.D.   On: 09/24/2022 16:33   DG Abd Portable 1V  Result Date: 09/24/2022 CLINICAL DATA:  Abdominal distention EXAM: PORTABLE ABDOMEN - 1 VIEW COMPARISON:  None Available. FINDINGS: Mildly distended prominent loops of small and large bowel. Bibasilar atelectasis. Aorto bi-iliac stent. No acute osseous abnormality. IMPRESSION: Mildly distended loops of small and large bowel, may represent ileus. Electronically  Signed   By: Yetta Glassman M.D.   On: 09/24/2022 16:22       [name] M.D. [facility] 09/26/2022, 2:05 PM  Available via Epic secure chat 7am-7pm After 7 pm, please refer to night coverage provider listed on amion.

## 2022-09-26 NOTE — Progress Notes (Signed)
  Echocardiogram 2D Echocardiogram has been performed.  [name] 09/26/2022, 9:00 AM

## 2022-09-27 ENCOUNTER — Inpatient Hospital Stay (HOSPITAL_COMMUNITY): Payer: No Typology Code available for payment source

## 2022-09-27 DIAGNOSIS — N1831 Chronic kidney disease, stage 3a: Secondary | ICD-10-CM | POA: Diagnosis not present

## 2022-09-27 DIAGNOSIS — Z8673 Personal history of transient ischemic attack (TIA), and cerebral infarction without residual deficits: Secondary | ICD-10-CM | POA: Diagnosis not present

## 2022-09-27 DIAGNOSIS — E1169 Type 2 diabetes mellitus with other specified complication: Secondary | ICD-10-CM | POA: Diagnosis not present

## 2022-09-27 DIAGNOSIS — N179 Acute kidney failure, unspecified: Secondary | ICD-10-CM | POA: Diagnosis not present

## 2022-09-27 LAB — CBC WITH DIFFERENTIAL/PLATELET
Abs Immature Granulocytes: 0.15 10*3/uL — ABNORMAL HIGH (ref 0.00–0.07)
Basophils Absolute: 0.1 10*3/uL (ref 0.0–0.1)
Basophils Relative: 1 %
Eosinophils Absolute: 0.2 10*3/uL (ref 0.0–0.5)
Eosinophils Relative: 2 %
HCT: 32.1 % — ABNORMAL LOW (ref 39.0–52.0)
Hemoglobin: 10.1 g/dL — ABNORMAL LOW (ref 13.0–17.0)
Immature Granulocytes: 2 %
Lymphocytes Relative: 13 %
Lymphs Abs: 1 10*3/uL (ref 0.7–4.0)
MCH: 29.2 pg (ref 26.0–34.0)
MCHC: 31.5 g/dL (ref 30.0–36.0)
MCV: 92.8 fL (ref 80.0–100.0)
Monocytes Absolute: 0.9 10*3/uL (ref 0.1–1.0)
Monocytes Relative: 12 %
Neutro Abs: 5.5 10*3/uL (ref 1.7–7.7)
Neutrophils Relative %: 70 %
Platelets: 166 10*3/uL (ref 150–400)
RBC: 3.46 MIL/uL — ABNORMAL LOW (ref 4.22–5.81)
RDW: 15 % (ref 11.5–15.5)
WBC: 7.8 10*3/uL (ref 4.0–10.5)
nRBC: 0 % (ref 0.0–0.2)

## 2022-09-27 LAB — BASIC METABOLIC PANEL
Anion gap: 12 (ref 5–15)
BUN: 39 mg/dL — ABNORMAL HIGH (ref 8–23)
CO2: 17 mmol/L — ABNORMAL LOW (ref 22–32)
Calcium: 7.8 mg/dL — ABNORMAL LOW (ref 8.9–10.3)
Chloride: 113 mmol/L — ABNORMAL HIGH (ref 98–111)
Creatinine, Ser: 2.44 mg/dL — ABNORMAL HIGH (ref 0.61–1.24)
GFR, Estimated: 27 mL/min — ABNORMAL LOW (ref >60)
Glucose, Bld: 118 mg/dL — ABNORMAL HIGH (ref 70–99)
Potassium: 3.6 mmol/L (ref 3.5–5.1)
Sodium: 142 mmol/L (ref 135–145)

## 2022-09-27 LAB — PROCALCITONIN: Procalcitonin: 3.55 ng/mL

## 2022-09-27 MED ORDER — ATORVASTATIN CALCIUM 20 MG PO TABS
20.0000 mg | ORAL_TABLET | Freq: Every day | ORAL | Status: DC
  Administered 2022-09-27 – 2022-09-29 (×3): 20 mg via ORAL
  Filled 2022-09-27 (×3): qty 1

## 2022-09-27 MED ORDER — FUROSEMIDE 10 MG/ML IJ SOLN
20.0000 mg | Freq: Once | INTRAMUSCULAR | Status: AC
  Administered 2022-09-27: 20 mg via INTRAVENOUS
  Filled 2022-09-27: qty 2

## 2022-09-27 MED ORDER — CLOPIDOGREL BISULFATE 75 MG PO TABS
75.0000 mg | ORAL_TABLET | Freq: Every day | ORAL | Status: DC
  Administered 2022-09-27 – 2022-09-30 (×4): 75 mg via ORAL
  Filled 2022-09-27 (×4): qty 1

## 2022-09-27 NOTE — Progress Notes (Signed)
[facility]                                                                               [name], is a 76 y.o. male, DOB - 22-Sep-1946, DJS:[id number] Admit date - 09/23/2022    Outpatient Primary MD for the patient is [facility], [name]  LOS - 4  days    Brief summary   76 y.o. male with PMH of IBS with diarrhea, duodenal ulcer, CVA, tardive dyskinesia, seizure disorder, PTSD, prolonged QT, gait abnormality, DM-2, diastolic CHF, CAD/CABG, orthostatic hypotension, BPH and AAA s/p aorto bi-iliac stent graft presenting with 2 days of abdominal pain and diarrhea, and admitted for AKI and sigmoid colitis.  Started on IV fluid, IV ceftriaxone and IV Flagyl.  Hemoccult positive.  C. difficile and GIP negative.  Hemoccult positive.  Improving.  GI on board and recommended outpatient EGD/ colonoscopy.     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 Dehydration and diarrhea vs rhabdomyolysis.  CT A/P without obstruction.  Had to stop the IV fluids due to bilateral pleural effusion.  Continue to check  renal parameters.  Baseline creatinine around 1.6 to 2. He was admitted with a creatinine of 5.15 and creatinine improved to 2.83  to 2.42  to 2.4 today.  Sodium bicarbonate added for acidosis and bicarb improved to 18 today.    Sigmoid colitis:  - improving diarrhea and abdominal pain.  - advanced diet to soft and is able to tolerate without any nausea, vomiting or abdominal pain.  - GI consulted and recommended outpatient EGD/ colonoscopy in 6 to 8 weeks.    Normocytic anemia/ iron deficiency anemia: - positive hemoccult. ? From colitis.  - continue with PPI.  - hemoglobin stable around 9.  - transfuse to keep hemoglobin greater than 7.  - recommend iron supplementation on discharge .  - anemia panel shows adequate b12 and folate.    H/o CVA:  - therapy evaluation ordered and pending.    Seizure  disorder; None this admission.    Tardive dyskinesia;  None this admission.    CAD s/p CABG  No chest pain.  Was on plavix. Resume it.     Acute systolic heart failure.  BNP elevated , bilateral pleural effusions, and dyspnea on exertion.  He denies any chest pain and sob today.  CT chest without contrast showed  Moderate right and small left pleural effusions with adjacent compressive atelectasis in the lower lobes, new compared to 09/23/2022. Multifocal ground-glass consolidations in the upper lobes, favored to represent multifocal pneumonia. Mildly enlarged right paratracheal and right hilar nodes, likely reactive.  Echocardiogram ordered and showed Global hypokinesis worse in the inferior base and septum . Left ventricular ejection fraction, is 30 to 35%. The left ventricle has moderately decreased function. The left ventricle demonstrates global hypokinesis. The left ventricular internal cavity size was mildly dilated.  Started the patient on low dose lasix, check strict intake and output.  Pump Back oxygen to keep sats greater than 90%. Patient currently on RA.  Repeat CXR shows mild CHF, recommend to continue with IV lasix.  Multifocal Pneumonia: Continue with IV rocephin and add IV zithromax for the multifocal pneumonia.  Will request SLP eval for aspiration pneumonia.  Stacy oxygen to keep sats greater 90%.  Duonebs as needed.  Patient is on RA and stable.     Type 2 DM Not on meds.  Hemoglobin A1c ordered and pending.  Gabapentin for diabetic neuropathy.     AAA s/p aorto bi-iliac stent graft   BPH:  On finasteride.      RN Pressure Injury Documentation: Pressure Injury 09/23/22 Coccyx Mid Stage 1 -  Intact skin with non-blanchable redness of a localized area usually over a bony prominence. (Active)  09/23/22 1816  Location: Coccyx  Location Orientation: Mid  Staging: Stage 1 -  Intact skin with non-blanchable redness of a localized area usually over a bony  prominence.  Wound Description (Comments):   Present on Admission: Yes  Dressing Type Foam - Lift dressing to assess site every shift 09/26/22 2047   Foam dressing .      Estimated body mass index is 22.46 kg/m as calculated from the following:   Height as of this encounter: '5\' 9"'$  (1.753 m).   Weight as of this encounter: 69 kg.  Code Status: DNR DVT Prophylaxis:  SCDs Start: 09/23/22 1614   Level of Care: Level of care: Progressive Family Communication: Updated patient's Family at bedside  Disposition Plan:     Remains inpatient appropriate: IV antibiotics and IV lasix.   Procedures:  CT chest without contrast.  Echocardiogram.   Consultants:   None.   Antimicrobials:   Anti-infectives (From admission, onward)    Start     Dose/Rate Route Frequency Ordered Stop   09/25/22 1445  azithromycin (ZITHROMAX) 500 mg in sodium chloride 0.9 % 250 mL IVPB        500 mg 250 mL/hr over 60 Minutes Intravenous Every 24 hours 09/25/22 1357     09/23/22 1645  cefTRIAXone (ROCEPHIN) 2 g in sodium chloride 0.9 % 100 mL IVPB        2 g 200 mL/hr over 30 Minutes Intravenous Daily 09/23/22 1630 09/28/22 1359   09/23/22 1645  metroNIDAZOLE (FLAGYL) IVPB 500 mg        500 mg 100 mL/hr over 60 Minutes Intravenous 2 times daily 09/23/22 1630 09/28/22 2159        Medications  Scheduled Meds:  finasteride  5 mg Oral Daily   FLUoxetine  40 mg Oral Daily   furosemide  20 mg Intravenous Once   gabapentin  100 mg Oral QHS   mirtazapine  45 mg Oral QHS   pantoprazole (PROTONIX) IV  40 mg Intravenous Q12H   pneumococcal 20-valent conjugate vaccine  0.5 mL Intramuscular Tomorrow-1000   rivastigmine  9.5 mg Transdermal Daily   sodium bicarbonate  650 mg Oral TID   Continuous Infusions:  sodium chloride 10 mL/hr at 09/26/22 1400   azithromycin 250 mL/hr at 09/26/22 1400   cefTRIAXone (ROCEPHIN)  IV 2 g (09/27/22 1514)   metronidazole 500 mg (09/27/22 1032)   PRN Meds:.sodium chloride,  acetaminophen **OR** acetaminophen, ipratropium-albuterol    Subjective:   [name] was seen and examined today.  Breathing better. Still coughing while drinking coffee. SLP eval ordered.   Objective:   Vitals:   09/26/22 0820 09/26/22 1249 09/26/22 2028 09/27/22 0539  BP: (!) 148/78 (!) 148/79 133/72 (!) 150/95  Pulse: (!) 103 (!) 102 (!) 102 (!) 103  Resp: '18 17 20 19  '$ Temp:  99.2  F (37.3 C) 98.5 F (36.9 C) 97.9 F (36.6 C)  TempSrc:  Oral Oral Oral  SpO2: 99% 98% 99% 97%  Weight:      Height:        Intake/Output Summary (Last 24 hours) at 09/27/2022 1522 Last data filed at 09/27/2022 0900 Gross per 24 hour  Intake 240 ml  Output 1000 ml  Net -760 ml    Filed Weights   09/23/22 1023  Weight: 69 kg     Exam General exam: Appears calm and comfortable  Respiratory system: Clear to auscultation. Respiratory effort normal. Cardiovascular system: S1 & S2 heard, RRR. No JVD,  Gastrointestinal system: Abdomen is nondistended, soft and nontender. Central nervous system: Alert and oriented. No focal neurological deficits. Extremities: Symmetric 5 x 5 power. Skin: No rashes, lesions or ulcers Psychiatry:  Mood & affect appropriate.      Data Reviewed:  I have personally reviewed following labs and imaging studies   CBC Lab Results  Component Value Date   WBC 7.8 09/27/2022   RBC 3.46 (L) 09/27/2022   HGB 10.1 (L) 09/27/2022   HCT 32.1 (L) 09/27/2022   MCV 92.8 09/27/2022   MCH 29.2 09/27/2022   PLT 166 09/27/2022   MCHC 31.5 09/27/2022   RDW 15.0 09/27/2022   LYMPHSABS 1.0 09/27/2022   MONOABS 0.9 09/27/2022   EOSABS 0.2 09/27/2022   BASOSABS 0.1 01/74/9449     Last metabolic panel Lab Results  Component Value Date   NA 142 09/27/2022   K 3.6 09/27/2022   CL 113 (H) 09/27/2022   CO2 17 (L) 09/27/2022   BUN 39 (H) 09/27/2022   CREATININE 2.44 (H) 09/27/2022   GLUCOSE 118 (H) 09/27/2022   GFRNONAA 27 (L) 09/27/2022   GFRAA 58 (L)  04/30/2016   CALCIUM 7.8 (L) 09/27/2022   PHOS 3.8 09/25/2022   PROT 6.9 09/23/2022   ALBUMIN 3.0 (L) 09/25/2022   LABGLOB 2.0 04/30/2016   AGRATIO 2.1 04/30/2016   BILITOT 0.7 09/23/2022   ALKPHOS 61 09/23/2022   AST 29 09/23/2022   ALT 21 09/23/2022   ANIONGAP 12 09/27/2022    CBG (last 3)  No results for input(s): "GLUCAP" in the last 72 hours.    Coagulation Profile: No results for input(s): "INR", "PROTIME" in the last 168 hours.   Radiology Studies: DG CHEST PORT 1 VIEW  Result Date: 09/27/2022 CLINICAL DATA:  Follow-up of pneumonia EXAM: PORTABLE CHEST 1 VIEW COMPARISON:  Chest radiograph done on 09/24/2022, CT chest done on 09/25/2022 FINDINGS: Transverse diameter of heart is increased. There is previous coronary bypass surgery. Central pulmonary vessels are prominent. Small patchy infiltrate is seen in medial right upper lung field overlying the upper aspect of right hilum with no significant change. There are linear densities in both lower lung fields. There is blunting of lateral CP angles. There is no pneumothorax. IMPRESSION: Cardiomegaly. Central pulmonary vessels are more prominent suggesting possible mild CHF. There are no signs of alveolar pulmonary edema. Linear densities in the lower lung fields suggest subsegmental atelectasis. There is no focal pulmonary consolidation. Minimal bilateral pleural effusions. Electronically Signed   By: Elmer Picker M.D.   On: 09/27/2022 11:33   ECHOCARDIOGRAM COMPLETE  Result Date: 09/26/2022    ECHOCARDIOGRAM REPORT   Patient Name:   [name] Date of Exam: 09/26/2022 Medical Rec #:  [id number]       Height:       69.0 in Accession #:    [id number]  Weight:       152.1 lb Date of Birth:  May 16, 1946       BSA:          1.839 m Patient Age:    42 years        BP:           137/74 mmHg Patient Gender: M               HR:           104 bpm. Exam Location:  Inpatient Procedure: 2D Echo and Intracardiac Opacification Agent  Indications:    dyspnea  History:        Patient has prior history of Echocardiogram examinations, most                 recent 12/07/2011. CAD, Stroke; Risk Factors:Hypertension,                 Diabetes and Dyslipidemia.  Sonographer:    Harvie Junior Referring Phys: Theola Sequin  Sonographer Comments: Technically difficult study due to poor echo windows. IMPRESSIONS  1. Global hypokinesis worse in the inferior base and septum . Left ventricular ejection fraction, by estimation, is 30 to 35%. The left ventricle has moderately decreased function. The left ventricle demonstrates global hypokinesis. The left ventricular  internal cavity size was mildly dilated. Left ventricular diastolic parameters were normal.  2. Right ventricular systolic function is normal. The right ventricular size is normal.  3. The mitral valve is abnormal. Trivial mitral valve regurgitation. No evidence of mitral stenosis.  4. The aortic valve is tricuspid. There is mild calcification of the aortic valve. There is mild thickening of the aortic valve. Aortic valve regurgitation is not visualized. Aortic valve sclerosis is present, with no evidence of aortic valve stenosis.  5. The inferior vena cava is normal in size with greater than 50% respiratory variability, suggesting right atrial pressure of 3 mmHg. FINDINGS  Left Ventricle: Global hypokinesis worse in the inferior base and septum. Left ventricular ejection fraction, by estimation, is 30 to 35%. The left ventricle has moderately decreased function. The left ventricle demonstrates global hypokinesis. Definity  contrast agent was given IV to delineate the left ventricular endocardial borders. The left ventricular internal cavity size was mildly dilated. There is no left ventricular hypertrophy. Left ventricular diastolic parameters were normal. Right Ventricle: The right ventricular size is normal. No increase in right ventricular wall thickness. Right ventricular systolic function is  normal. Left Atrium: Left atrial size was normal in size. Right Atrium: Right atrial size was normal in size. Pericardium: There is no evidence of pericardial effusion. Mitral Valve: The mitral valve is abnormal. There is mild thickening of the mitral valve leaflet(s). There is mild calcification of the mitral valve leaflet(s). Mild mitral annular calcification. Trivial mitral valve regurgitation. No evidence of mitral valve stenosis. Tricuspid Valve: The tricuspid valve is normal in structure. Tricuspid valve regurgitation is mild . No evidence of tricuspid stenosis. Aortic Valve: The aortic valve is tricuspid. There is mild calcification of the aortic valve. There is mild thickening of the aortic valve. Aortic valve regurgitation is not visualized. Aortic valve sclerosis is present, with no evidence of aortic valve stenosis. Aortic valve mean gradient measures 3.0 mmHg. Aortic valve peak gradient measures 5.0 mmHg. Aortic valve area, by VTI measures 4.07 cm. Pulmonic Valve: The pulmonic valve was normal in structure. Pulmonic valve regurgitation is not visualized. No evidence of pulmonic stenosis. Aorta: The aortic root is normal  in size and structure. Venous: The inferior vena cava is normal in size with greater than 50% respiratory variability, suggesting right atrial pressure of 3 mmHg. IAS/Shunts: The interatrial septum was not well visualized.  LEFT VENTRICLE PLAX 2D LVIDd:         5.60 cm      Diastology LVIDs:         4.50 cm      LV e' medial:    11.10 cm/s LV PW:         1.10 cm      LV E/e' medial:  10.1 LV IVS:        1.10 cm      LV e' lateral:   9.03 cm/s LVOT diam:     2.40 cm      LV E/e' lateral: 12.4 LV SV:         70 LV SV Index:   38 LVOT Area:     4.52 cm  LV Volumes (MOD) LV vol d, MOD A2C: 182.0 ml LV vol d, MOD A4C: 141.0 ml LV vol s, MOD A2C: 125.0 ml LV vol s, MOD A4C: 92.1 ml LV SV MOD A2C:     57.0 ml LV SV MOD A4C:     141.0 ml LV SV MOD BP:      50.7 ml RIGHT VENTRICLE RV Basal diam:   4.30 cm RV Mid diam:    3.90 cm RV S prime:     11.30 cm/s TAPSE (M-mode): 1.6 cm LEFT ATRIUM             Index        RIGHT ATRIUM           Index LA diam:        3.40 cm 1.85 cm/m   RA Area:     17.10 cm LA Vol (A2C):   46.9 ml 25.50 ml/m  RA Volume:   47.50 ml  25.83 ml/m LA Vol (A4C):   49.8 ml 27.08 ml/m LA Biplane Vol: 49.9 ml 27.13 ml/m  AORTIC VALVE                    PULMONIC VALVE AV Area (Vmax):    4.60 cm     PV Vmax:       0.83 m/s AV Area (Vmean):   4.32 cm     PV Peak grad:  2.7 mmHg AV Area (VTI):     4.07 cm AV Vmax:           112.00 cm/s AV Vmean:          77.200 cm/s AV VTI:            0.171 m AV Peak Grad:      5.0 mmHg AV Mean Grad:      3.0 mmHg LVOT Vmax:         114.00 cm/s LVOT Vmean:        73.800 cm/s LVOT VTI:          0.154 m LVOT/AV VTI ratio: 0.90  AORTA Ao Root diam: 3.40 cm MITRAL VALVE MV Area (PHT): 6.17 cm     SHUNTS MV Decel Time: 123 msec     Systemic VTI:  0.15 m MR Peak grad: 27.6 mmHg     Systemic Diam: 2.40 cm MR Vmax:      262.50 cm/s MV E velocity: 112.00 cm/s MV A velocity: 42.70 cm/s MV E/A ratio:  2.62 Jenkins Rouge MD  Electronically signed by Jenkins Rouge MD Signature Date/Time: 09/26/2022/9:31:17 AM    Final        [name] M.D. [facility] 09/27/2022, 3:22 PM  Available via Epic secure chat 7am-7pm After 7 pm, please refer to night coverage provider listed on amion.

## 2022-09-27 NOTE — Plan of Care (Signed)
Patient lying in bed, able to use urinal effectively, turns and repositions self in bed,
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
## 2022-09-28 ENCOUNTER — Encounter (HOSPITAL_COMMUNITY): Payer: Self-pay | Admitting: Student

## 2022-09-28 DIAGNOSIS — Z8673 Personal history of transient ischemic attack (TIA), and cerebral infarction without residual deficits: Secondary | ICD-10-CM | POA: Diagnosis not present

## 2022-09-28 DIAGNOSIS — E1169 Type 2 diabetes mellitus with other specified complication: Secondary | ICD-10-CM | POA: Diagnosis not present

## 2022-09-28 DIAGNOSIS — N179 Acute kidney failure, unspecified: Secondary | ICD-10-CM | POA: Diagnosis not present

## 2022-09-28 DIAGNOSIS — N1831 Chronic kidney disease, stage 3a: Secondary | ICD-10-CM | POA: Diagnosis not present

## 2022-09-28 LAB — CBC WITH DIFFERENTIAL/PLATELET
Abs Immature Granulocytes: 0.28 10*3/uL — ABNORMAL HIGH (ref 0.00–0.07)
Basophils Absolute: 0 10*3/uL (ref 0.0–0.1)
Basophils Relative: 1 %
Eosinophils Absolute: 0.3 10*3/uL (ref 0.0–0.5)
Eosinophils Relative: 4 %
HCT: 31.2 % — ABNORMAL LOW (ref 39.0–52.0)
Hemoglobin: 10 g/dL — ABNORMAL LOW (ref 13.0–17.0)
Immature Granulocytes: 4 %
Lymphocytes Relative: 19 %
Lymphs Abs: 1.3 10*3/uL (ref 0.7–4.0)
MCH: 29.5 pg (ref 26.0–34.0)
MCHC: 32.1 g/dL (ref 30.0–36.0)
MCV: 92 fL (ref 80.0–100.0)
Monocytes Absolute: 0.8 10*3/uL (ref 0.1–1.0)
Monocytes Relative: 12 %
Neutro Abs: 4.1 10*3/uL (ref 1.7–7.7)
Neutrophils Relative %: 60 %
Platelets: 176 10*3/uL (ref 150–400)
RBC: 3.39 MIL/uL — ABNORMAL LOW (ref 4.22–5.81)
RDW: 15 % (ref 11.5–15.5)
WBC: 6.8 10*3/uL (ref 4.0–10.5)
nRBC: 0.3 % — ABNORMAL HIGH (ref 0.0–0.2)

## 2022-09-28 LAB — BASIC METABOLIC PANEL
Anion gap: 8 (ref 5–15)
BUN: 40 mg/dL — ABNORMAL HIGH (ref 8–23)
CO2: 22 mmol/L (ref 22–32)
Calcium: 7.5 mg/dL — ABNORMAL LOW (ref 8.9–10.3)
Chloride: 112 mmol/L — ABNORMAL HIGH (ref 98–111)
Creatinine, Ser: 2.29 mg/dL — ABNORMAL HIGH (ref 0.61–1.24)
GFR, Estimated: 29 mL/min — ABNORMAL LOW (ref >60)
Glucose, Bld: 145 mg/dL — ABNORMAL HIGH (ref 70–99)
Potassium: 3.2 mmol/L — ABNORMAL LOW (ref 3.5–5.1)
Sodium: 142 mmol/L (ref 135–145)

## 2022-09-28 LAB — HEMOGLOBIN A1C
Hgb A1c MFr Bld: 6.2 % — ABNORMAL HIGH (ref 4.8–5.6)
Mean Plasma Glucose: 131 mg/dL

## 2022-09-28 MED ORDER — AZITHROMYCIN 250 MG PO TABS
500.0000 mg | ORAL_TABLET | Freq: Every day | ORAL | Status: DC
  Administered 2022-09-28 – 2022-09-29 (×2): 500 mg via ORAL
  Filled 2022-09-28 (×2): qty 2

## 2022-09-28 MED ORDER — FUROSEMIDE 20 MG PO TABS: 20.0000 mg | ORAL_TABLET | Freq: Every day | ORAL | 1 refills | Status: DC

## 2022-09-28 MED ORDER — POTASSIUM CHLORIDE CRYS ER 20 MEQ PO TBCR
40.0000 meq | EXTENDED_RELEASE_TABLET | Freq: Once | ORAL | Status: AC
  Administered 2022-09-28: 40 meq via ORAL
  Filled 2022-09-28: qty 2

## 2022-09-28 MED ORDER — PANTOPRAZOLE SODIUM 40 MG PO TBEC: 40.0000 mg | DELAYED_RELEASE_TABLET | Freq: Every day | ORAL | 2 refills | Status: AC

## 2022-09-28 NOTE — Plan of Care (Signed)
  Problem: Education: Goal: Knowledge of General Education information will improve Description: Including pain rating scale, medication(s)/side effects and non-pharmacologic comfort measures Outcome: Progressing   Problem: Clinical Measurements: Goal: Ability to maintain clinical measurements within normal limits will improve Outcome: Progressing   Problem: Coping: Goal: Level of anxiety will decrease Outcome: Progressing   Problem: Elimination: Goal: Will not experience complications related to bowel motility Outcome: Progressing   Problem: Pain Managment: Goal: General experience of comfort will improve Outcome: Progressing   Problem: Safety: Goal: Ability to remain free from injury will improve Outcome: Progressing   Problem: Skin Integrity: Goal: Risk for impaired skin integrity will decrease Outcome: Progressing

## 2022-09-28 NOTE — Progress Notes (Signed)
  Transition of Care [facility]) Screening Note   Patient Details  Name: [name] Date of Birth: 23-Feb-1946   Transition of Care [facility]) CM/SW Contact:    Dessa Phi, RN Phone Number: 09/28/2022, 4:03 PM    Transition of Care Department [facility]) has reviewed patient and no TOC needs have been identified at this time. We will continue to monitor patient advancement through interdisciplinary progression rounds. If new patient transition needs arise, please place a TOC consult.

## 2022-09-28 NOTE — Progress Notes (Signed)
Mobility Specialist - Progress Note   09/28/22 1141  Mobility  Activity Ambulated with assistance in hallway  Level of Assistance Standby assist, set-up cues, supervision of patient - no hands on  Assistive Device Front wheel walker  Distance Ambulated (ft) 250 ft  Range of Motion/Exercises Active  Activity Response Tolerated well  Mobility Referral Yes  $Mobility charge 1 Mobility   Pt was found in bed and agreeable to ambulate. Had no complaints during session and at EOS returned to recliner chair. Was left with family and necessities in reach.  Ferd Hibbs Mobility Specialist

## 2022-09-28 NOTE — Progress Notes (Signed)
[facility]                                                                               [name], is a 76 y.o. male, DOB - 1946-06-12, HBZ:[id number] Admit date - 09/23/2022    Outpatient Primary MD for the patient is [facility]  LOS - 5  days    Brief summary   76 y.o. male with PMH of IBS with diarrhea, duodenal ulcer, CVA, tardive dyskinesia, seizure disorder, PTSD, prolonged QT, gait abnormality, DM-2, diastolic CHF, CAD/CABG, orthostatic hypotension, BPH and AAA s/p aorto bi-iliac stent graft presenting with 2 days of abdominal pain and diarrhea, and admitted for AKI and sigmoid colitis.  Started on IV fluid, IV ceftriaxone and IV Flagyl.  Hemoccult positive.  C. difficile and GIP negative.  Hemoccult positive.  Improving.  GI on board and recommended outpatient EGD/ colonoscopy.  Patient seen and examined. He was scheduled for discharge today, but he had a large bowel movement , possibly loose/ diarrheal. Wife at bedside is worried he might be dehydrated. 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 Dehydration and diarrhea vs rhabdomyolysis.  CT A/P without obstruction.  Had to stop the IV fluids due to bilateral pleural effusion.  Continue to check  renal parameters.  Baseline creatinine around 1.6 to 2. He was admitted with a creatinine of 5.15 and creatinine improved to 2.83  to 2.42  to 2.4 to 2.2  today.  Sodium bicarbonate added for acidosis and bicarb improved to 18 today.    Sigmoid colitis:  -  only oe episode of BM , no abdominal pain.  - advanced diet to soft and is able to tolerate without any nausea, vomiting or abdominal pain.  - GI consulted and recommended outpatient EGD/ colonoscopy in 6 to 8 weeks.    Normocytic anemia/ iron deficiency anemia: - positive hemoccult. ? From colitis.  - continue  with PPI.  - hemoglobin stable around 9.  - transfuse to keep hemoglobin greater than 7.  - recommend iron supplementation on discharge .  - anemia panel shows adequate b12 and folate.    H/o CVA:  - therapy evaluation ordered .   Seizure disorder; None this admission.    Tardive dyskinesia;  None this admission.    CAD s/p CABG  No chest pain.  Was on plavix. Resume it.     Acute systolic heart failure.  BNP elevated , bilateral pleural effusions, and dyspnea on exertion.  He denies any chest pain and sob today.  CT chest without contrast showed  Moderate right and small left pleural effusions with adjacent compressive atelectasis in the lower lobes, new compared to 09/23/2022. Multifocal ground-glass consolidations in the upper lobes, favored to represent multifocal pneumonia. Mildly enlarged right paratracheal and right hilar nodes, likely reactive.  Echocardiogram ordered and showed Global hypokinesis worse in the inferior base and septum . Left ventricular ejection fraction, is 30 to 35%. The left ventricle has moderately decreased function. The left ventricle demonstrates  global hypokinesis. The left ventricular internal cavity size was mildly dilated.  Started the patient on low dose lasix, appears to have diuresed well.  check strict intake and output.  [place] oxygen to keep sats greater than 90%. Patient currently on RA.    Multifocal Pneumonia: Complete the course of antibiotics.  Will request SLP eval for aspiration pneumonia.  [place] oxygen to keep sats greater 90%.  Duonebs as needed.  Patient is on RA and stable.     Type 2 DM Not on meds.  Hemoglobin A1c ordered and pending.  Gabapentin for diabetic neuropathy.     AAA s/p aorto bi-iliac stent graft   BPH:  On finasteride.      RN Pressure Injury Documentation: Pressure Injury 09/23/22 Coccyx Mid Stage 1 -  Intact skin with non-blanchable redness of a localized area usually over a bony prominence.  (Active)  09/23/22 1816  Location: Coccyx  Location Orientation: Mid  Staging: Stage 1 -  Intact skin with non-blanchable redness of a localized area usually over a bony prominence.  Wound Description (Comments):   Present on Admission: Yes  Dressing Type None 09/28/22 0800   Foam dressing .      Estimated body mass index is 22.46 kg/m as calculated from the following:   Height as of this encounter: '5\' 9"'$  (1.753 m).   Weight as of this encounter: 69 kg.  Code Status: DNR DVT Prophylaxis:  SCDs Start: 09/23/22 1614   Level of Care: Level of care: Progressive Family Communication: Updated patient's Family at bedside  Disposition Plan:     Remains inpatient appropriate: IV antibiotics and IV lasix.   Procedures:  CT chest without contrast.  Echocardiogram.   Consultants:   None.   Antimicrobials:   Anti-infectives (From admission, onward)    Start     Dose/Rate Route Frequency Ordered Stop   09/28/22 1000  azithromycin (ZITHROMAX) tablet 500 mg        500 mg Oral Daily 09/28/22 0728     09/25/22 1445  azithromycin (ZITHROMAX) 500 mg in sodium chloride 0.9 % 250 mL IVPB  Status:  Discontinued        500 mg 250 mL/hr over 60 Minutes Intravenous Every 24 hours 09/25/22 1357 09/28/22 0727   09/23/22 1645  cefTRIAXone (ROCEPHIN) 2 g in sodium chloride 0.9 % 100 mL IVPB        2 g 200 mL/hr over 30 Minutes Intravenous Daily 09/23/22 1630 09/27/22 1600   09/23/22 1645  metroNIDAZOLE (FLAGYL) IVPB 500 mg        500 mg 100 mL/hr over 60 Minutes Intravenous 2 times daily 09/23/22 1630 09/28/22 1139        Medications  Scheduled Meds:  atorvastatin  20 mg Oral QHS   azithromycin  500 mg Oral Daily   clopidogrel  75 mg Oral Daily   finasteride  5 mg Oral Daily   FLUoxetine  40 mg Oral Daily   gabapentin  100 mg Oral QHS   mirtazapine  45 mg Oral QHS   pantoprazole (PROTONIX) IV  40 mg Intravenous Q12H   pneumococcal 20-valent conjugate vaccine  0.5 mL Intramuscular  Tomorrow-1000   rivastigmine  9.5 mg Transdermal Daily   sodium bicarbonate  650 mg Oral TID   Continuous Infusions:  sodium chloride 10 mL/hr at 09/28/22 1743   PRN Meds:.sodium chloride, acetaminophen **OR** acetaminophen, ipratropium-albuterol    Subjective:   [name] was seen and examined today.  No new complaints.  Objective:   Vitals:   09/27/22 1549 09/27/22 2100 09/28/22 0507 09/28/22 1206  BP: 128/74 124/69 (!) 140/77 134/76  Pulse: 97 93 92 (!) 102  Resp: 18 (!) '24 20 17  '$ Temp: (!) 97.5 F (36.4 C) 97.8 F (36.6 C) 98 F (36.7 C) 98 F (36.7 C)  TempSrc: Oral Oral Oral Oral  SpO2: 100% 97% 97% 99%  Weight:      Height:        Intake/Output Summary (Last 24 hours) at 09/28/2022 1817 Last data filed at 09/28/2022 0801 Gross per 24 hour  Intake 972.01 ml  Output 725 ml  Net 247.01 ml    Filed Weights   09/23/22 1023  Weight: 69 kg     Exam General exam: Appears calm and comfortable  Respiratory system: Clear to auscultation. Respiratory effort normal. Cardiovascular system: S1 & S2 heard, RRR. No JVD, murmurs, rubs, gallops or clicks. No pedal edema. Gastrointestinal system: Abdomen is nondistended, soft and nontender. No organomegaly or masses felt. Normal bowel sounds heard. Central nervous system: Alert and oriented. No focal neurological deficits. Extremities: Symmetric 5 x 5 power. Skin: No rashes, lesions or ulcers Psychiatry: Judgement and insight appear normal. Mood & affect appropriate.       Data Reviewed:  I have personally reviewed following labs and imaging studies   CBC Lab Results  Component Value Date   WBC 6.8 09/28/2022   RBC 3.39 (L) 09/28/2022   HGB 10.0 (L) 09/28/2022   HCT 31.2 (L) 09/28/2022   MCV 92.0 09/28/2022   MCH 29.5 09/28/2022   PLT 176 09/28/2022   MCHC 32.1 09/28/2022   RDW 15.0 09/28/2022   LYMPHSABS 1.3 09/28/2022   MONOABS 0.8 09/28/2022   EOSABS 0.3 09/28/2022   BASOSABS 0.0 09/28/2022      Last metabolic panel Lab Results  Component Value Date   NA 142 09/28/2022   K 3.2 (L) 09/28/2022   CL 112 (H) 09/28/2022   CO2 22 09/28/2022   BUN 40 (H) 09/28/2022   CREATININE 2.29 (H) 09/28/2022   GLUCOSE 145 (H) 09/28/2022   GFRNONAA 29 (L) 09/28/2022   GFRAA 58 (L) 04/30/2016   CALCIUM 7.5 (L) 09/28/2022   PHOS 3.8 09/25/2022   PROT 6.9 09/23/2022   ALBUMIN 3.0 (L) 09/25/2022   LABGLOB 2.0 04/30/2016   AGRATIO 2.1 04/30/2016   BILITOT 0.7 09/23/2022   ALKPHOS 61 09/23/2022   AST 29 09/23/2022   ALT 21 09/23/2022   ANIONGAP 8 09/28/2022    CBG (last 3)  No results for input(s): "GLUCAP" in the last 72 hours.    Coagulation Profile: No results for input(s): "INR", "PROTIME" in the last 168 hours.   Radiology Studies: DG CHEST PORT 1 VIEW  Result Date: 09/27/2022 CLINICAL DATA:  Follow-up of pneumonia EXAM: PORTABLE CHEST 1 VIEW COMPARISON:  Chest radiograph done on 09/24/2022, CT chest done on 09/25/2022 FINDINGS: Transverse diameter of heart is increased. There is previous coronary bypass surgery. Central pulmonary vessels are prominent. Small patchy infiltrate is seen in medial right upper lung field overlying the upper aspect of right hilum with no significant change. There are linear densities in both lower lung fields. There is blunting of lateral CP angles. There is no pneumothorax. IMPRESSION: Cardiomegaly. Central pulmonary vessels are more prominent suggesting possible mild CHF. There are no signs of alveolar pulmonary edema. Linear densities in the lower lung fields suggest subsegmental atelectasis. There is no focal pulmonary consolidation. Minimal bilateral pleural effusions. Electronically Signed  By: Elmer Picker M.D.   On: 09/27/2022 11:33       [name] M.D. [facility] 09/28/2022, 6:17 PM  Available via Epic secure chat 7am-7pm After 7 pm, please refer to night coverage provider listed on amion.

## 2022-09-28 NOTE — Progress Notes (Signed)
Occupational Therapy Treatment Patient Details Name: [name] MRN: [id number] DOB: 1946-09-13 Today's Date: 09/28/2022   History of present illness 76 y.o. male with PMH of IBS with diarrhea, duodenal ulcer, CVA, tardive dyskinesia, seizure disorder, PTSD, prolonged QT, gait abnormality, DM-2, diastolic CHF, CAD/CABG, orthostatic hypotension, BPH and AAA s/p aorto bi-iliac stent graft presenting with 2 days of abdominal pain and diarrhea.   OT comments  The session emphasized functional transfer training and functional strengthening needed to maximize his independence with self-care tasks. He required close supervision to occasional min guard assist for transfers from the bedside chair & ambulation using a RW. He had difficulty with sock management, however this is typical of his baseline, as he normal wears slide in shoes at home. He is making gradual functional progress towards his OT goals and will likely only require 1-2 additional OT visits during his hospital stay.    Recommendations for follow up therapy are one component of a multi-disciplinary discharge planning process, led by the attending physician.  Recommendations may be updated based on patient status, additional functional criteria and insurance authorization.    Follow Up Recommendations  No OT follow up     Assistance Recommended at Discharge Set up Supervision/Assistance  Patient can return home with the following  Assistance with cooking/housework;Help with stairs or ramp for entrance;Assist for transportation   Equipment Recommendations  None recommended by OT       Precautions / Restrictions Precautions Precautions: Fall Restrictions Weight Bearing Restrictions: No       Mobility Bed Mobility               General bed mobility comments: Pt was received seated in the bedside chair    Transfers Overall transfer level: Needs assistance Equipment used: Rolling walker (2 wheels) Transfers: Sit  to/from Stand Sit to Stand: Supervision                     ADL either performed or assessed with clinical judgement   ADL Overall ADL's : Needs assistance/impaired Eating/Feeding: Independent Eating/Feeding Details (indicate cue type and reason): based on clinical judgement Grooming: Set up;Min guard Grooming Details (indicate cue type and reason): Set-up seated or min guard standing         Upper Body Dressing : Set up;Sitting   Lower Body Dressing: Minimal assistance               Cognition Arousal/Alertness: Awake/alert Behavior During Therapy: WFL for tasks assessed/performed   Area of Impairment: Memory, Safety/judgement                         Safety/Judgement: Decreased awareness of safety                         Pertinent Vitals/ Pain       Pain Assessment Pain Assessment: No/denies pain         Frequency  Min 2X/week        Progress Toward Goals  OT Goals(current goals can now be found in the care plan section)  Progress towards OT goals: Progressing toward goals  Acute Rehab OT Goals Patient Stated Goal: to get better and return home OT Goal Formulation: With patient Time For Goal Achievement: 10/09/22 Potential to Achieve Goals: Good  Plan Discharge plan remains appropriate       AM-PAC OT "6 Clicks" Daily Activity     Outcome Measure  Help from another person eating meals?: None Help from another person taking care of personal grooming?: None Help from another person toileting, which includes using toliet, bedpan, or urinal?: A Little Help from another person bathing (including washing, rinsing, drying)?: A Little Help from another person to put on and taking off regular upper body clothing?: None Help from another person to put on and taking off regular lower body clothing?: A Little 6 Click Score: 21    End of Session Equipment Utilized During Treatment: Rolling walker (2 wheels)  OT Visit Diagnosis:  Unsteadiness on feet (R26.81);Muscle weakness (generalized) (M62.81)   Activity Tolerance Patient tolerated treatment well   Patient Left in chair;with call bell/phone within reach;with family/visitor present   Nurse Communication Mobility status        Time: 9937-1696 OT Time Calculation (min): 10 min  Charges: OT General Charges $OT Visit: 1 Visit OT Treatments $Therapeutic Activity: 8-22 mins     Leota Sauers, OTR/L 09/28/2022, 1:36 PM

## 2022-09-28 NOTE — Progress Notes (Signed)
PHARMACIST - PHYSICIAN COMMUNICATION DR:   [name] CONCERNING: Antibiotic IV to Oral Route Change Policy  RECOMMENDATION: This patient is receiving azithromycin by the intravenous route.  Based on criteria approved by the Pharmacy and Therapeutics Committee, the antibiotic(s) is/are being converted to the equivalent oral dose form(s).   DESCRIPTION: These criteria include: Patient being treated for a respiratory tract infection, urinary tract infection, cellulitis or clostridium difficile associated diarrhea if on metronidazole The patient is not neutropenic and does not exhibit a GI malabsorption state The patient is eating (either orally or via tube) and/or has been taking other orally administered medications for a least 24 hours The patient is improving clinically and has a Tmax < 100.5  If you have questions about this conversion, please contact the Pharmacy Department  '[]'$   [phone] )  [name] '[]'$   [phone] )  [name]  '[]'$   [phone] )  [facility] '[x]'$   [phone] )  [facility]    Thank you for allowing pharmacy to be a part of this patient's care.  [name], PharmD, BCPS Clinical Pharmacist [name] Please utilize Amion for appropriate phone number to reach the unit pharmacist ([place]) 09/28/2022 7:29 AM

## 2022-09-28 NOTE — Discharge Summary (Deleted)
Physician Discharge Summary   Patient: [name] MRN: [id number] DOB: [birth year]  Admit date:     09/23/2022  Discharge date: 09/28/22  Discharge Physician: [name]   PCP: [facility]   Recommendations at discharge:  Pleas follow up with PCP in one week.  Please follow up with cardiology for the new onset  Discharge Diagnoses: Principal Problem:   AKI (acute kidney injury) ([place]) Active Problems:   PTSD (post-traumatic stress disorder)   GAIT IMBALANCE   CAD (coronary artery disease)   Seizure disorder ([place])   Tardive dyskinesia   History of CVA (cerebrovascular accident)   IBS (irritable bowel syndrome) with episodic urgent diarrhea   Diabetes mellitus ([place])   AAA (abdominal aortic aneurysm) without rupture (HCC)   Prolonged QT interval   BPH (benign prostatic hyperplasia)   Chronic diastolic CHF (congestive heart failure) (HCC)   Chronic kidney disease, stage 3a [facility])    Hospital Course: 76 y.o. male with PMH of IBS with diarrhea, duodenal ulcer, CVA, tardive dyskinesia, seizure disorder, PTSD, prolonged QT, gait abnormality, DM-2, diastolic CHF, CAD/CABG, orthostatic hypotension, BPH and AAA s/p aorto bi-iliac stent graft presenting with 2 days of abdominal pain and diarrhea, and admitted for AKI and sigmoid colitis.  Started on IV fluid, IV ceftriaxone and IV Flagyl.  Hemoccult positive.  C. difficile and GIP negative.  Hemoccult positive.  Improving.  GI on board and recommended outpatient EGD/ colonoscopy.  Patient seen and examined. He was scheduled for discharge today, but he had a large bowel movement , possibly loose/ diarrheal. Wife at bedside is worried he might be dehydrated. We will watch him overnight for more episodes of loose bowel movements     Assessment and Plan:   Acute on stage 3b CKD with metabolic acidosis ? Dehydration and diarrhea vs rhabdomyolysis.  CT A/P without obstruction.  Had to stop the IV fluids due to bilateral  pleural effusion.  Continue to check  renal parameters.  Baseline creatinine around 1.6 to 2. He was admitted with a creatinine of 5.15 and creatinine improved to 2.83  to 2.42  to 2.4 to 2.2  today.  Sodium bicarbonate added for acidosis and bicarb improved to 18 today.      Sigmoid colitis:  -  only oe episode of BM , no abdominal pain.  - advanced diet to soft and is able to tolerate without any nausea, vomiting or abdominal pain.  - GI consulted and recommended outpatient EGD/ colonoscopy in 6 to 8 weeks.      Normocytic anemia/ iron deficiency anemia: - positive hemoccult. ? From colitis.  - continue with PPI.  - hemoglobin stable around 9.  - transfuse to keep hemoglobin greater than 7.  - recommend iron supplementation on discharge .  - anemia panel shows adequate b12 and folate.      H/o CVA:  - therapy evaluation ordered .     Seizure disorder; None this admission.      Tardive dyskinesia;  None this admission.      CAD s/p CABG  No chest pain.  Was on plavix. Resume it.        Acute systolic heart failure.  BNP elevated , bilateral pleural effusions, and dyspnea on exertion.  He denies any chest pain and sob today.  CT chest without contrast showed  Moderate right and small left pleural effusions with adjacent compressive atelectasis in the lower lobes, new compared to 09/23/2022. Multifocal ground-glass consolidations in the upper lobes,  favored to represent multifocal pneumonia. Mildly enlarged right paratracheal and right hilar nodes, likely reactive.   Echocardiogram ordered and showed Global hypokinesis worse in the inferior base and septum . Left ventricular ejection fraction, is 30 to 35%. The left ventricle has moderately decreased function. The left ventricle demonstrates global hypokinesis. The left ventricular internal cavity size was mildly dilated.   Started the patient on low dose lasix, appears to have diuresed well.  check strict intake and  output.  [place] oxygen to keep sats greater than 90%. Patient currently on RA.      Multifocal Pneumonia: Complete the course of antibiotics.  Will request SLP eval for aspiration pneumonia.  [place] oxygen to keep sats greater 90%.  Duonebs as needed.  Patient is on RA and stable.        Type 2 DM Not on meds.  Hemoglobin A1c ordered and pending.  Gabapentin for diabetic neuropathy.       AAA s/p aorto bi-iliac stent graft     BPH:  On finasteride.        RN Pressure Injury Documentation:     Pressure Injury 09/23/22 Coccyx Mid Stage 1 -  Intact skin with non-blanchable redness of a localized area usually over a bony prominence. (Active)  09/23/22 1816  Location: Coccyx  Location Orientation: Mid  Staging: Stage 1 -  Intact skin with non-blanchable redness of a localized area usually over a bony prominence.  Wound Description (Comments):   Present on Admission: Yes  Dressing Type None 09/28/22 0800   Foam dressing .      Estimated body mass index is 22.46 kg/m as calculated from the following:   Height as of this encounter: '5\' 9"'$  (1.753 m).   Weight as of this encounter: 69 kg.          Diet recommendation:  Discharge Diet Orders (From admission, onward)     Start     Ordered   09/28/22 0000  Diet - low sodium heart healthy        09/28/22 1208           Cardiac diet DISCHARGE MEDICATION: Allergies as of 09/28/2022       Reactions   Aspirin Other (See Comments)   GI REACTION   Donepezil Nausea And Vomiting   Galantamine Other (See Comments)   Feeling agitated   Memantine Other (See Comments)   Feeling agitated   Omeprazole Other (See Comments)   Indigestion   Terazosin Other (See Comments)   Low blood pressure        Medication List     STOP taking these medications    amLODipine 10 MG tablet Commonly known as: NORVASC   lisinopril 20 MG tablet Commonly known as: ZESTRIL   METAMUCIL PO       TAKE these medications     atorvastatin 40 MG tablet Commonly known as: LIPITOR Take 20 mg by mouth at bedtime.   cloNIDine 0.1 MG tablet Commonly known as: CATAPRES Take 1 tablet (0.1 mg total) by mouth every 8 (eight) hours. Hold of hr < 60/min or sbp <120 What changed:  when to take this additional instructions   clopidogrel 75 MG tablet Commonly known as: PLAVIX Take 1 tablet (75 mg total) by mouth daily.   cyanocobalamin 1000 MCG/ML injection Commonly known as: VITAMIN B12 Inject 1,000 mcg into the skin every 30 (thirty) days.   doxylamine (Sleep) 25 MG tablet Commonly known as: UNISOM Take 25 mg by mouth at  bedtime as needed for sleep.   ferrous gluconate 324 MG tablet Commonly known as: FERGON Take 324 mg by mouth every Monday, Wednesday, and Friday.   finasteride 5 MG tablet Commonly known as: PROSCAR Take 5 mg by mouth daily.   FLUoxetine 20 MG capsule Commonly known as: PROZAC Take 40 mg by mouth daily.   furosemide 20 MG tablet Commonly known as: Lasix Take 1 tablet (20 mg total) by mouth daily.   gabapentin 300 MG capsule Commonly known as: NEURONTIN Take 300 mg by mouth in the morning and at bedtime.   hydrocerin Crea Apply 1 application topically 3 (three) times daily as needed (dry legs/dry skin).   loperamide 2 MG tablet Commonly known as: IMODIUM A-D Take 2 mg by mouth 4 (four) times daily as needed for diarrhea or loose stools.   mirtazapine 15 MG tablet Commonly known as: REMERON Take 45 mg by mouth at bedtime.   pantoprazole 40 MG tablet Commonly known as: PROTONIX Take 1 tablet (40 mg total) by mouth daily before breakfast.   rivastigmine 9.5 mg/24hr Commonly known as: EXELON APPLY 1 PATCH EXTERNALLY TO THE SKIN EVERY DAY AS DIRECTED What changed:  how much to take how to take this when to take this additional instructions        Discharge Exam: Filed Weights   09/23/22 1023  Weight: 69 kg   General exam: Appears calm and comfortable   Respiratory system: Clear to auscultation. Respiratory effort normal. Cardiovascular system: S1 & S2 heard, RRR. No JVD, murmurs, rubs, gallops or clicks. No pedal edema. Gastrointestinal system: Abdomen is nondistended, soft and nontender. No organomegaly or masses felt. Normal bowel sounds heard. Central nervous system: Alert and oriented. No focal neurological deficits. Extremities: Symmetric 5 x 5 power. Skin: No rashes, lesions or ulcers Psychiatry: Judgement and insight appear normal. Mood & affect appropriate.    Condition at discharge: fair  The results of significant diagnostics from this hospitalization (including imaging, microbiology, ancillary and laboratory) are listed below for reference.   Imaging Studies: DG CHEST PORT 1 VIEW  Result Date: 09/27/2022 CLINICAL DATA:  Follow-up of pneumonia EXAM: PORTABLE CHEST 1 VIEW COMPARISON:  Chest radiograph done on 09/24/2022, CT chest done on 09/25/2022 FINDINGS: Transverse diameter of heart is increased. There is previous coronary bypass surgery. Central pulmonary vessels are prominent. Small patchy infiltrate is seen in medial right upper lung field overlying the upper aspect of right hilum with no significant change. There are linear densities in both lower lung fields. There is blunting of lateral CP angles. There is no pneumothorax. IMPRESSION: Cardiomegaly. Central pulmonary vessels are more prominent suggesting possible mild CHF. There are no signs of alveolar pulmonary edema. Linear densities in the lower lung fields suggest subsegmental atelectasis. There is no focal pulmonary consolidation. Minimal bilateral pleural effusions. Electronically Signed   By: Elmer Picker M.D.   On: 09/27/2022 11:33   ECHOCARDIOGRAM COMPLETE  Result Date: 09/26/2022    ECHOCARDIOGRAM REPORT   Patient Name:   STETSON PELAEZ Date of Exam: 09/26/2022 Medical Rec #:  [id number]       Height:       69.0 in Accession #:    [id number]      Weight:        152.1 lb Date of Birth:  02/27/1946       BSA:          1.839 m Patient Age:    13 years  BP:           137/74 mmHg Patient Gender: M               HR:           104 bpm. Exam Location:  Inpatient Procedure: 2D Echo and Intracardiac Opacification Agent Indications:    dyspnea  History:        Patient has prior history of Echocardiogram examinations, most                 recent 12/07/2011. CAD, Stroke; Risk Factors:Hypertension,                 Diabetes and Dyslipidemia.  Sonographer:    Harvie Junior Referring Phys: Theola Sequin  Sonographer Comments: Technically difficult study due to poor echo windows. IMPRESSIONS  1. Global hypokinesis worse in the inferior base and septum . Left ventricular ejection fraction, by estimation, is 30 to 35%. The left ventricle has moderately decreased function. The left ventricle demonstrates global hypokinesis. The left ventricular  internal cavity size was mildly dilated. Left ventricular diastolic parameters were normal.  2. Right ventricular systolic function is normal. The right ventricular size is normal.  3. The mitral valve is abnormal. Trivial mitral valve regurgitation. No evidence of mitral stenosis.  4. The aortic valve is tricuspid. There is mild calcification of the aortic valve. There is mild thickening of the aortic valve. Aortic valve regurgitation is not visualized. Aortic valve sclerosis is present, with no evidence of aortic valve stenosis.  5. The inferior vena cava is normal in size with greater than 50% respiratory variability, suggesting right atrial pressure of 3 mmHg. FINDINGS  Left Ventricle: Global hypokinesis worse in the inferior base and septum. Left ventricular ejection fraction, by estimation, is 30 to 35%. The left ventricle has moderately decreased function. The left ventricle demonstrates global hypokinesis. Definity  contrast agent was given IV to delineate the left ventricular endocardial borders. The left ventricular internal  cavity size was mildly dilated. There is no left ventricular hypertrophy. Left ventricular diastolic parameters were normal. Right Ventricle: The right ventricular size is normal. No increase in right ventricular wall thickness. Right ventricular systolic function is normal. Left Atrium: Left atrial size was normal in size. Right Atrium: Right atrial size was normal in size. Pericardium: There is no evidence of pericardial effusion. Mitral Valve: The mitral valve is abnormal. There is mild thickening of the mitral valve leaflet(s). There is mild calcification of the mitral valve leaflet(s). Mild mitral annular calcification. Trivial mitral valve regurgitation. No evidence of mitral valve stenosis. Tricuspid Valve: The tricuspid valve is normal in structure. Tricuspid valve regurgitation is mild . No evidence of tricuspid stenosis. Aortic Valve: The aortic valve is tricuspid. There is mild calcification of the aortic valve. There is mild thickening of the aortic valve. Aortic valve regurgitation is not visualized. Aortic valve sclerosis is present, with no evidence of aortic valve stenosis. Aortic valve mean gradient measures 3.0 mmHg. Aortic valve peak gradient measures 5.0 mmHg. Aortic valve area, by VTI measures 4.07 cm. Pulmonic Valve: The pulmonic valve was normal in structure. Pulmonic valve regurgitation is not visualized. No evidence of pulmonic stenosis. Aorta: The aortic root is normal in size and structure. Venous: The inferior vena cava is normal in size with greater than 50% respiratory variability, suggesting right atrial pressure of 3 mmHg. IAS/Shunts: The interatrial septum was not well visualized.  LEFT VENTRICLE PLAX 2D LVIDd:  5.60 cm      Diastology LVIDs:         4.50 cm      LV e' medial:    11.10 cm/s LV PW:         1.10 cm      LV E/e' medial:  10.1 LV IVS:        1.10 cm      LV e' lateral:   9.03 cm/s LVOT diam:     2.40 cm      LV E/e' lateral: 12.4 LV SV:         70 LV SV Index:    38 LVOT Area:     4.52 cm  LV Volumes (MOD) LV vol d, MOD A2C: 182.0 ml LV vol d, MOD A4C: 141.0 ml LV vol s, MOD A2C: 125.0 ml LV vol s, MOD A4C: 92.1 ml LV SV MOD A2C:     57.0 ml LV SV MOD A4C:     141.0 ml LV SV MOD BP:      50.7 ml RIGHT VENTRICLE RV Basal diam:  4.30 cm RV Mid diam:    3.90 cm RV S prime:     11.30 cm/s TAPSE (M-mode): 1.6 cm LEFT ATRIUM             Index        RIGHT ATRIUM           Index LA diam:        3.40 cm 1.85 cm/m   RA Area:     17.10 cm LA Vol (A2C):   46.9 ml 25.50 ml/m  RA Volume:   47.50 ml  25.83 ml/m LA Vol (A4C):   49.8 ml 27.08 ml/m LA Biplane Vol: 49.9 ml 27.13 ml/m  AORTIC VALVE                    PULMONIC VALVE AV Area (Vmax):    4.60 cm     PV Vmax:       0.83 m/s AV Area (Vmean):   4.32 cm     PV Peak grad:  2.7 mmHg AV Area (VTI):     4.07 cm AV Vmax:           112.00 cm/s AV Vmean:          77.200 cm/s AV VTI:            0.171 m AV Peak Grad:      5.0 mmHg AV Mean Grad:      3.0 mmHg LVOT Vmax:         114.00 cm/s LVOT Vmean:        73.800 cm/s LVOT VTI:          0.154 m LVOT/AV VTI ratio: 0.90  AORTA Ao Root diam: 3.40 cm MITRAL VALVE MV Area (PHT): 6.17 cm     SHUNTS MV Decel Time: 123 msec     Systemic VTI:  0.15 m MR Peak grad: 27.6 mmHg     Systemic Diam: 2.40 cm MR Vmax:      262.50 cm/s MV E velocity: 112.00 cm/s MV A velocity: 42.70 cm/s MV E/A ratio:  2.62 Jenkins Rouge MD Electronically signed by Jenkins Rouge MD Signature Date/Time: 09/26/2022/9:31:17 AM    Final    US Abdomen Limited  Result Date: 09/25/2022 CLINICAL DATA:  Ascites. EXAM: ULTRASOUND ABDOMEN LIMITED COMPARISON:  CT abdomen pelvis 09/23/2022 FINDINGS: Sonographic survey of the abdomen was performed.  No  free fluid. IMPRESSION: No ascites on sonographic survey of the abdomen. Electronically Signed   By: Ileana Roup M.D.   On: 09/25/2022 14:41   CT CHEST WO CONTRAST  Result Date: 09/25/2022 CLINICAL DATA:  Respiratory illness. New right suprahilar density on chest radiograph  09/24/2022. EXAM: CT CHEST WITHOUT CONTRAST TECHNIQUE: Multidetector CT imaging of the chest was performed following the standard protocol without IV contrast. RADIATION DOSE REDUCTION: This exam was performed according to the departmental dose-optimization program which includes automated exposure control, adjustment of the mA and/or kV according to patient size and/or use of iterative reconstruction technique. COMPARISON:  Chest radiograph 09/24/2022; CT abdomen pelvis 09/23/2022 FINDINGS: Patient respiratory motion degrades image quality. Cardiovascular: Postoperative changes of coronary artery bypass graft. Aortic calcifications. Heart is normal in size. No pericardial effusion. Mediastinum/Nodes: Enlarged right paratracheal and right hilar nodes both measure 1.2 cm short axis (series 2, image 49, 64). No other mediastinal lymphadenopathy. No definite left hilar adenopathy, though evaluation is limited due to lack of intravenous contrast. No axillary lymphadenopathy. The trachea, thyroid gland, esophagus demonstrate no significant abnormalities. Lungs/Pleura: Moderate right and small left pleural effusions with adjacent compressive atelectasis in the lower lobes, new compared to 09/23/2022. Multifocal ground-glass consolidations in the upper lobes. No pneumothorax. Upper Abdomen: No acute abnormality. Cholecystectomy. Right renal cyst as described on recent CT abdomen pelvis exam. No follow-up imaging is indicated. Musculoskeletal: Postoperative changes of median sternotomy. Bilateral gynecomastia. Old fractures of the posterior left eighth through tenth ribs. No acute or suspicious osseous finding. IMPRESSION: 1. Moderate right and small left pleural effusions with adjacent compressive atelectasis in the lower lobes, new compared to 09/23/2022. 2. Multifocal ground-glass consolidations in the upper lobes, favored to represent multifocal pneumonia. 3. Mildly enlarged right paratracheal and right hilar nodes,  likely reactive. 4. Aortic atherosclerosis. Aortic Atherosclerosis (ICD10-I70.0). Electronically Signed   By: Ileana Roup M.D.   On: 09/25/2022 12:56   DG Chest Port 1 View  Result Date: 09/24/2022 CLINICAL DATA:  Wheezing EXAM: PORTABLE CHEST 1 VIEW COMPARISON:  11/16/2021 FINDINGS: The cardio pericardial silhouette is enlarged. There is pulmonary vascular congestion without overt pulmonary edema. New right suprahilar density identified which could be related to infection/inflammation although hilar lymphadenopathy. Central right lung lesion not excluded. No pleural effusion. The visualized bony structures of the thorax are unremarkable. IMPRESSION: New right suprahilar density which could be related to infection/inflammation although hilar lymphadenopathy not excluded. Central right lung lesion also a consideration. CT chest with contrast recommended to further evaluate. Electronically Signed   By: Misty Stanley M.D.   On: 09/24/2022 16:33   DG Abd Portable 1V  Result Date: 09/24/2022 CLINICAL DATA:  Abdominal distention EXAM: PORTABLE ABDOMEN - 1 VIEW COMPARISON:  None Available. FINDINGS: Mildly distended prominent loops of small and large bowel. Bibasilar atelectasis. Aorto bi-iliac stent. No acute osseous abnormality. IMPRESSION: Mildly distended loops of small and large bowel, may represent ileus. Electronically Signed   By: Yetta Glassman M.D.   On: 09/24/2022 16:22   CT ABDOMEN PELVIS WO CONTRAST  Result Date: 09/23/2022 CLINICAL DATA:  Abdominal pain, diarrhea EXAM: CT ABDOMEN AND PELVIS WITHOUT CONTRAST TECHNIQUE: Multidetector CT imaging of the abdomen and pelvis was performed following the standard protocol without IV contrast. RADIATION DOSE REDUCTION: This exam was performed according to the departmental dose-optimization program which includes automated exposure control, adjustment of the mA and/or kV according to patient size and/or use of iterative reconstruction technique.  COMPARISON:  11/16/2021 FINDINGS: Lower chest: Heart is  enlarged in size. Coronary artery calcifications are seen. There is slight increase in interstitial markings in the periphery of lower lung fields, possibly minimal scarring or passive congestive change. Metallic sutures are seen in the sternum. Hepatobiliary: There is no dilation of bile ducts. Surgical clips are seen in gallbladder fossa. Pancreas: No focal abnormalities are seen. Spleen: Unremarkable. Adrenals/Urinary Tract: Adrenals are unremarkable. There is no hydronephrosis. There is 5.3 cm cyst in the upper pole of right kidney. There are foci of cortical thinning in the kidneys. There are scattered calcifications in the renal artery branches. There is 2 mm calcific density in the midportion of right kidney, possibly nonobstructing renal stone or calcification in a small renal artery branch. Ureters are not dilated. There is contrast in the lumen of the bladder suggesting recent contrast administration. Stomach/Bowel: Small hiatal hernia is seen. Stomach is not distended. Small bowel loops are not dilated. Appendix is not dilated. There is mild diffuse wall thickening in sigmoid colon. Scattered diverticula are seen in colon. There is no loculated pericolic fluid collection. Vascular/Lymphatic: There is infrarenal aortic aneurysm measuring 5.7 cm with no significant interval change. There is previous aorto bi-iliac stent graft. Extensive calcifications are seen in aorta and its major branches. Reproductive: There are coarse calcifications in prostate. Prostate is enlarged projecting into the base of the bladder. Other: There is no ascites or pneumoperitoneum. Gynecomastia is seen in both breasts. Musculoskeletal: Degenerative changes are noted in lumbar spine. IMPRESSION: There is no evidence of intestinal obstruction or pneumoperitoneum. Appendix is not dilated. There is no hydronephrosis. There is diffuse wall thickening in sigmoid colon suggesting  inflammatory or infectious colitis. Scattered diverticula are seen in colon without signs of focal acute diverticulitis. Small hiatal hernia. Right renal cysts. Aortic atherosclerosis. There is aneurism in infrarenal abdominal aorta with no significant interval change. There is previous aorto bi-iliac stent graft. There is subtle interval increase in interstitial markings in the periphery of both lower lung fields suggesting possible interstitial pneumonia or scarring. Coronary artery calcifications are seen. Other findings as described in the body of the report. Electronically Signed   By: Elmer Picker M.D.   On: 09/23/2022 15:12    Microbiology: Results for orders placed or performed during the hospital encounter of 09/23/22  C Difficile Quick Screen w PCR reflex     Status: None   Collection Time: 09/23/22  1:00 PM   Specimen: STOOL  Result Value Ref Range Status   C Diff antigen NEGATIVE NEGATIVE Final   C Diff toxin NEGATIVE NEGATIVE Final   C Diff interpretation No C. difficile detected.  Final    Comment: Performed at [facility], [place] [address], [place], [place] [address]  Gastrointestinal Panel by PCR , Stool     Status: None   Collection Time: 09/23/22  1:00 PM   Specimen: Stool  Result Value Ref Range Status   Campylobacter species NOT DETECTED NOT DETECTED Final   Plesimonas shigelloides NOT DETECTED NOT DETECTED Final   Salmonella species NOT DETECTED NOT DETECTED Final   Yersinia enterocolitica NOT DETECTED NOT DETECTED Final   Vibrio species NOT DETECTED NOT DETECTED Final   Vibrio cholerae NOT DETECTED NOT DETECTED Final   Enteroaggregative E coli (EAEC) NOT DETECTED NOT DETECTED Final   Enteropathogenic E coli (EPEC) NOT DETECTED NOT DETECTED Final   Enterotoxigenic E coli (ETEC) NOT DETECTED NOT DETECTED Final   Shiga like toxin producing E coli (STEC) NOT DETECTED NOT DETECTED Final   Shigella/Enteroinvasive E  coli (EIEC) NOT DETECTED NOT  DETECTED Final   Cryptosporidium NOT DETECTED NOT DETECTED Final   Cyclospora cayetanensis NOT DETECTED NOT DETECTED Final   Entamoeba histolytica NOT DETECTED NOT DETECTED Final   Giardia lamblia NOT DETECTED NOT DETECTED Final   Adenovirus F40/41 NOT DETECTED NOT DETECTED Final   Astrovirus NOT DETECTED NOT DETECTED Final   Norovirus GI/GII NOT DETECTED NOT DETECTED Final   Rotavirus A NOT DETECTED NOT DETECTED Final   Sapovirus (I, II, IV, and V) NOT DETECTED NOT DETECTED Final    Comment: Performed at [facility], [address], [place], [place] [address]  Urine Culture     Status: Abnormal   Collection Time: 09/23/22  2:00 PM   Specimen: Urine, Clean Catch  Result Value Ref Range Status   Specimen Description   Final    URINE, CLEAN CATCH Performed at [facility], [place] [address] [place], [place] [address]    Special Requests   Final    NONE Performed at [facility], [place] [address], [place], [place] [address]    Culture (A)  Final    <10,000 COLONIES/mL INSIGNIFICANT GROWTH Performed at [facility] [address] [place], [place] [address]    Report Status 09/24/2022 FINAL  Final    Labs: CBC: Recent Labs  Lab 09/23/22 1217 09/24/22 0518 09/25/22 0653 09/26/22 0517 09/27/22 0538 09/28/22 0501  WBC 17.2* 10.8* 11.7* 8.8 7.8 6.8  NEUTROABS 14.7*  --   --  7.0 5.5 4.1  HGB 10.3* 9.0* 9.6* 9.8* 10.1* 10.0*  HCT 32.0* 27.7* 29.8* 30.2* 32.1* 31.2*  MCV 93.0 92.3 92.3 92.6 92.8 92.0  PLT 189 138* 158 158 166 625   Basic Metabolic Panel: Recent Labs  Lab 09/24/22 0518 09/25/22 0653 09/26/22 0517 09/27/22 0538 09/28/22 0501  NA 134* 139 143 142 142  K 4.2 4.2 4.0 3.6 3.2*  CL 108 113* 116* 113* 112*  CO2 12* 16* 18* 17* 22  GLUCOSE 86 144* 121* 118* 145*  BUN 67* 49* 45* 39* 40*  CREATININE 3.91* 2.83* 2.42* 2.44* 2.29*  CALCIUM 7.3* 7.9* 8.0* 7.8* 7.5*  MG 2.0 1.9  --   --   --   PHOS 4.8* 3.8   --   --   --    Liver Function Tests: Recent Labs  Lab 09/23/22 1217 09/24/22 0518 09/25/22 0653  AST 29  --   --   ALT 21  --   --   ALKPHOS 61  --   --   BILITOT 0.7  --   --   PROT 6.9  --   --   ALBUMIN 3.7 2.7* 3.0*   CBG: No results for input(s): "GLUCAP" in the last 168 hours.  Discharge time spent: 41 minutes.   Signed: [name], MD [facility] 09/28/2022

## 2022-09-29 ENCOUNTER — Telehealth: Payer: Self-pay

## 2022-09-29 LAB — CBC WITH DIFFERENTIAL/PLATELET
Abs Immature Granulocytes: 0.31 10*3/uL — ABNORMAL HIGH (ref 0.00–0.07)
Basophils Absolute: 0.1 10*3/uL (ref 0.0–0.1)
Basophils Relative: 1 %
Eosinophils Absolute: 0.2 10*3/uL (ref 0.0–0.5)
Eosinophils Relative: 4 %
HCT: 31.8 % — ABNORMAL LOW (ref 39.0–52.0)
Hemoglobin: 10 g/dL — ABNORMAL LOW (ref 13.0–17.0)
Immature Granulocytes: 6 %
Lymphocytes Relative: 23 %
Lymphs Abs: 1.3 10*3/uL (ref 0.7–4.0)
MCH: 29.5 pg (ref 26.0–34.0)
MCHC: 31.4 g/dL (ref 30.0–36.0)
MCV: 93.8 fL (ref 80.0–100.0)
Monocytes Absolute: 0.6 10*3/uL (ref 0.1–1.0)
Monocytes Relative: 10 %
Neutro Abs: 3.2 10*3/uL (ref 1.7–7.7)
Neutrophils Relative %: 56 %
Platelets: 201 10*3/uL (ref 150–400)
RBC: 3.39 MIL/uL — ABNORMAL LOW (ref 4.22–5.81)
RDW: 14.9 % (ref 11.5–15.5)
WBC: 5.7 10*3/uL (ref 4.0–10.5)
nRBC: 0 % (ref 0.0–0.2)

## 2022-09-29 LAB — BASIC METABOLIC PANEL
Anion gap: 7 (ref 5–15)
BUN: 31 mg/dL — ABNORMAL HIGH (ref 8–23)
CO2: 22 mmol/L (ref 22–32)
Calcium: 7.4 mg/dL — ABNORMAL LOW (ref 8.9–10.3)
Chloride: 111 mmol/L (ref 98–111)
Creatinine, Ser: 1.99 mg/dL — ABNORMAL HIGH (ref 0.61–1.24)
GFR, Estimated: 34 mL/min — ABNORMAL LOW (ref >60)
Glucose, Bld: 129 mg/dL — ABNORMAL HIGH (ref 70–99)
Potassium: 3.5 mmol/L (ref 3.5–5.1)
Sodium: 140 mmol/L (ref 135–145)

## 2022-09-29 MED ORDER — BANATROL TF EN LIQD
60.0000 mL | Freq: Two times a day (BID) | ENTERAL | Status: DC
  Administered 2022-09-29 – 2022-09-30 (×2): 60 mL via ORAL
  Filled 2022-09-29 (×2): qty 60

## 2022-09-29 MED ORDER — ENSURE ENLIVE PO LIQD: 237.0000 mL | Freq: Two times a day (BID) | ORAL | Status: DC

## 2022-09-29 NOTE — Progress Notes (Signed)
Physical Therapy Treatment Patient Details Name: [name] MRN: [id number] DOB: 07/17/1946 Today's Date: 09/29/2022   History of Present Illness 76 y.o. male with PMH of IBS with diarrhea, duodenal ulcer, CVA, tardive dyskinesia, seizure disorder, PTSD, prolonged QT, gait abnormality, DM-2, diastolic CHF, CAD/CABG, orthostatic hypotension, BPH and AAA s/p aorto bi-iliac stent graft presenting with 2 days of abdominal pain and diarrhea.    PT Comments    General Comments: AxO x 3.  Improved cognition but needs encouragement. Assisted OOB to amb in hallway went well.  General Gait Details: assisted with amb with walker and without.  Pt has a HX Tardive Dyskinesia and admits to "furniture walking".  "House is too cluttered for a walker" and "I use a cane sometimes if I can find it".  Tolerated a great distance. Pt plans to return home.  No post Acute PT needs.   Recommendations for follow up therapy are one component of a multi-disciplinary discharge planning process, led by the attending physician.  Recommendations may be updated based on patient status, additional functional criteria and insurance authorization.  Follow Up Recommendations  No PT follow up     Assistance Recommended at Discharge Frequent or constant Supervision/Assistance  Patient can return home with the following Help with stairs or ramp for entrance;Assist for transportation;Assistance with cooking/housework   Equipment Recommendations  None recommended by PT    Recommendations for Other Services       Precautions / Restrictions Precautions Precautions: Fall Precaution Comments: Hx Tardive Dyskinesia with Gait Abnormality Restrictions Weight Bearing Restrictions: No     Mobility  Bed Mobility Overal bed mobility: Needs Assistance Bed Mobility: Supine to Sit     Supine to sit: Supervision     General bed mobility comments: self able with increased time    Transfers Overall transfer level: Needs  assistance   Transfers: Sit to/from Stand Sit to Stand: Supervision           General transfer comment: good use of hands to steady self    Ambulation/Gait Ambulation/Gait assistance: Supervision Gait Distance (Feet): 300 Feet Assistive device: Rolling walker (2 wheels), None Gait Pattern/deviations: Step-through pattern, Decreased stride length Gait velocity: decreased     General Gait Details: assisted with amb with walker and without.  Pt has a HX Tardive Dyskinesia and admits to "furniture walking".  "House is too cluttered for a walker" and "I use a cane sometimes if I can find it".  Tolerated a great distance.   Stairs             Wheelchair Mobility    Modified Rankin (Stroke Patients Only)       Balance                                            Cognition Arousal/Alertness: Awake/alert Behavior During Therapy: WFL for tasks assessed/performed                                   General Comments: AxO x 3.  Improved cognition but needs encouragement.        Exercises      General Comments        Pertinent Vitals/Pain Pain Assessment Pain Assessment: No/denies pain    Home Living  Prior Function            PT Goals (current goals can now be found in the care plan section) Progress towards PT goals: Progressing toward goals    Frequency           PT Plan Current plan remains appropriate    Co-evaluation              AM-PAC PT "6 Clicks" Mobility   Outcome Measure  Help needed turning from your back to your side while in a flat bed without using bedrails?: None Help needed moving from lying on your back to sitting on the side of a flat bed without using bedrails?: None Help needed moving to and from a bed to a chair (including a wheelchair)?: None Help needed standing up from a chair using your arms (e.g., wheelchair or bedside chair)?: None Help needed  to walk in hospital room?: A Little Help needed climbing 3-5 steps with a railing? : A Little 6 Click Score: 22    End of Session Equipment Utilized During Treatment: Gait belt Activity Tolerance: Patient tolerated treatment well Patient left: with call bell/phone within reach;in chair;with chair alarm set;with family/visitor present Nurse Communication: Mobility status PT Visit Diagnosis: Other abnormalities of gait and mobility (R26.89);Difficulty in walking, not elsewhere classified (R26.2)     Time: 2233-6122 PT Time Calculation (min) (ACUTE ONLY): 14 min  Charges:  $Gait Training: 8-22 mins                     Rica Koyanagi  PTA [place] [facility] M-F          [phone] Weekend pager [phone]

## 2022-09-29 NOTE — Progress Notes (Signed)
Initial Nutrition Assessment  DOCUMENTATION CODES:   Non-severe (moderate) malnutrition in context of chronic illness  INTERVENTION:  Encourage adequate PO Intake with emphasis on consistent protein consumption Ensure Enlive po BID, each supplement provides 350 kcal and 20 grams of protein. Banatrol BID-provides 45kcal, 5g soluble fiber and 2g protein per serving. Consider adding probiotic daily  NUTRITION DIAGNOSIS:   Moderate Malnutrition related to chronic illness (IBS, PTSD, CHF, CAD) as evidenced by moderate fat depletion, moderate muscle depletion, severe muscle depletion.   GOAL:   Patient will meet greater than or equal to 90% of their needs  MONITOR:   PO intake, Supplement acceptance, Labs, Weight trends  REASON FOR ASSESSMENT:   Consult Assessment of nutrition requirement/status  ASSESSMENT:   Pt admitted with diarrhea, found to have AKI. PMH significant for IBS with diarrhea, duodenal ulcer, CVA, tardive dyskinesia, seizure disorder, PTSD, prolonged QT, DM2, diastolic CHF, CAD/CABG, orthostatic hypotension, BPH, and AAA s/p aorto bi-iliac stent graft  Nutrition history provided with assistance of his wife at bedside. He typically eats a big meal for breakfast and throughout the rest of the day will snack on items that are readily available such as [employer] bars. The last meal prior to admission was a hamburger from [employer] which they suspect may have exacerbated his diarrhea. He had a cholecystectomy many years ago so he typically tries to avoid high fat foods. Pt denies significant changes to his typical daily intake. He is edentulous, does not wear dentures and denies difficulty with more firm/solid textured foods.   Meal completions:  12/23: 0% lunch 12/24: 35% breakfast, 50% lunch 12/25: 100% breakfast 12/26: 100% breakfast, 100% lunch, 100% dinner 12/27: 100% breakfast  Pt endorses chronic recurrence of diarrhea likely d/t IBS. Encouraged adequate intake  of fiber when not in a flare. Pt denies having a diagnosis of diabetes- he does not usually check his blood sugar or use insulin.   Pt is uncertain of recent weight loss but his wife believes that he has been experiencing weight loss. Pt feels that his clothes have been more "baggy." Reviewed weight history. Unfortunately, there is limited documentation of weights on file within the last year. His weight has been down trending over the last several years, however since 02/15, it appears to have remained stable at 69 kg.   Medications: remeron, protonix, sodium bicarbonate TID  Labs: BUN 31, Cr 1.99, GFR 34, HgbA1c 6.2%  NUTRITION - FOCUSED PHYSICAL EXAM:  Flowsheet Row Most Recent Value  Orbital Region Moderate depletion  Upper Arm Region Moderate depletion  Thoracic and Lumbar Region Mild depletion  Buccal Region Moderate depletion  Temple Region Moderate depletion  Clavicle Bone Region Severe depletion  Clavicle and Acromion Bone Region Severe depletion  Scapular Bone Region Severe depletion  Dorsal Hand Moderate depletion  Patellar Region Moderate depletion  Anterior Thigh Region Moderate depletion  Posterior Calf Region Mild depletion  Edema (RD Assessment) None  Hair Reviewed  Eyes Reviewed  Mouth Other (Comment)  [edentulous]  Skin Reviewed  Nails Reviewed       Diet Order:   Diet Order             Diet 2 gram sodium Room service appropriate? Yes; Fluid consistency: Thin  Diet effective now           Diet - low sodium heart healthy                   EDUCATION NEEDS:   Education needs have  been addressed  Skin:  Skin Assessment: Skin Integrity Issues: Skin Integrity Issues:: Stage I Stage I: coccyx  Last BM:  12/27 (type 6 x2)  Height:   Ht Readings from Last 1 Encounters:  09/23/22 '5\' 9"'$  (1.753 m)    Weight:   Wt Readings from Last 1 Encounters:  09/23/22 69 kg   BMI:  Body mass index is 22.46 kg/m.  Estimated Nutritional Needs:   Kcal:   1800-2000  Protein:  90-105g  Fluid:  >/=1.8L  Clayborne Dana, RDN, LDN Clinical Nutrition

## 2022-09-29 NOTE — Progress Notes (Signed)
Consultation Progress Note   Patient: [name] WSF:[id number] DOB: 05/16/1946 DOA: 09/23/2022 DOS: the patient was seen and examined on 09/29/2022 Primary service: Lavene Penagos, Manfred Shirts, MD  Brief hospital course: 76 y.o. male with PMH of IBS with diarrhea, duodenal ulcer, CVA, tardive dyskinesia, seizure disorder, PTSD, prolonged QT, gait abnormality, DM-2, diastolic CHF, CAD/CABG, orthostatic hypotension, BPH and AAA s/p aorto bi-iliac stent graft presenting with 2 days of abdominal pain and diarrhea, and admitted for AKI and sigmoid colitis.  Started on IV fluid, IV ceftriaxone and IV Flagyl.  Hemoccult positive.  C. difficile and GIP negative.  Hemoccult positive.  Improving.  GI on board and recommended outpatient EGD/ colonoscopy.  Patient seen and examined. He was scheduled for discharge today, but he had a large bowel movement , possibly loose/ diarrheal. Wife at bedside is worried he might be dehydrated. 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 Dehydration and diarrhea vs rhabdomyolysis.  CT A/P without obstruction.  Had to stop the IV fluids due to bilateral pleural effusion.  Continue to check  renal parameters.  Baseline creatinine around 1.6 to 2. He was admitted with a creatinine of 5.15 and creatinine improved to 2.83  to 2.42  to 2.4 to 2.2  today.  Sodium bicarbonate added for acidosis and bicarb improved to 18 today.      Sigmoid colitis:  -  only oe episode of BM , no abdominal pain.  - advanced diet to soft and is able to tolerate without any nausea, vomiting or abdominal pain.  - GI consulted and recommended outpatient EGD/ colonoscopy in 6 to 8 weeks.      Normocytic anemia/ iron deficiency anemia: - positive hemoccult. ? From colitis.  - continue with PPI.  - hemoglobin stable around 9.  - transfuse to keep hemoglobin greater than 7.  - recommend iron  supplementation on discharge .  - anemia panel shows adequate b12 and folate.      H/o CVA:  - therapy evaluation ordered .     Seizure disorder; None this admission.      Tardive dyskinesia;  None this admission.      CAD s/p CABG  No chest pain.  Was on plavix. Resume it.        Acute systolic heart failure.  BNP elevated , bilateral pleural effusions, and dyspnea on exertion.  He denies any chest pain and sob today.  CT chest without contrast showed  Moderate right and small left pleural effusions with adjacent compressive atelectasis in the lower lobes, new compared to 09/23/2022. Multifocal ground-glass consolidations in the upper lobes, favored to represent multifocal pneumonia. Mildly enlarged right paratracheal and right hilar nodes, likely reactive.   Echocardiogram ordered and showed Global hypokinesis worse in the inferior base and septum . Left ventricular ejection fraction, is 30 to 35%. The left ventricle has moderately decreased function. The left ventricle demonstrates global hypokinesis. The left ventricular internal cavity size was mildly dilated.   Started the patient on low dose lasix, appears to have diuresed well.  check strict intake and output.  [place] oxygen to keep sats greater than 90%. Patient currently on RA.      Multifocal Pneumonia: Complete the course of antibiotics.  Will request SLP eval for aspiration pneumonia.  [place] oxygen to keep sats greater 90%.  Duonebs as needed.  Patient is on RA and stable.  Type 2 DM Not on meds.  Hemoglobin A1c ordered and pending.  Gabapentin for diabetic neuropathy.       AAA s/p aorto bi-iliac stent graft     BPH:  On finasteride.       TRH will continue to follow the patient.  Subjective: No complaints this morning, eating breakfast. He reports having diarrhea yesterday.  Physical Exam: Vitals:   09/28/22 0507 09/28/22 1206 09/28/22 2040 09/29/22 0446  BP: (!) 140/77 134/76 (!)  140/74 129/67  Pulse: 92 (!) 102 (!) 101 97  Resp: '20 17 18 20  '$ Temp: 98 F (36.7 C) 98 F (36.7 C) 98.5 F (36.9 C) 98.6 F (37 C)  TempSrc: Oral Oral Oral Oral  SpO2: 97% 99% 98% 96%  Weight:      Height:      General exam: Appears calm and comfortable  Respiratory system: Clear to auscultation. Respiratory effort normal. Cardiovascular system: S1 & S2 heard, RRR. No JVD, murmurs, rubs, gallops or clicks. No pedal edema. Gastrointestinal system: Abdomen is nondistended, soft and nontender. No organomegaly or masses felt. Normal bowel sounds heard. Central nervous system: Alert and oriented. No focal neurological deficits. Extremities: Symmetric 5 x 5 power. Skin: No rashes, lesions or ulcers Psychiatry: Judgement and insight appear normal. Mood & affect appropriate.   Data Reviewed:  There are no new results to review at this time.  Family Communication:   Time spent: 15 minutes.  Author: Cristela Felt, MD 09/29/2022 10:03 AM  For on call review [email or web address].

## 2022-09-29 NOTE — Telephone Encounter (Signed)
Pt wife Marylyn Ishihara notified of Dr. Lyndel Safe recommendations:  Pt was scheduled for an office visit on 10/14/2022 at 1:30 with Vicie Mutters PA.  Marylyn Ishihara made aware: Address provided: Marylyn Ishihara verbalized understanding with all questions answered.
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
## 2022-09-29 NOTE — Telephone Encounter (Signed)
[facility] hospital appt Received: 5 days ago Jackquline Denmark, MD  Gillermina Hu, RN Needs outpt appt with Glendell Docker or APP in 2-3 weeks Thx  RG

## 2022-09-30 LAB — CBC WITH DIFFERENTIAL/PLATELET
Abs Immature Granulocytes: 0.3 10*3/uL — ABNORMAL HIGH (ref 0.00–0.07)
Basophils Absolute: 0 10*3/uL (ref 0.0–0.1)
Basophils Relative: 1 %
Eosinophils Absolute: 0.2 10*3/uL (ref 0.0–0.5)
Eosinophils Relative: 3 %
HCT: 29.2 % — ABNORMAL LOW (ref 39.0–52.0)
Hemoglobin: 9.1 g/dL — ABNORMAL LOW (ref 13.0–17.0)
Immature Granulocytes: 5 %
Lymphocytes Relative: 18 %
Lymphs Abs: 1.2 10*3/uL (ref 0.7–4.0)
MCH: 29.3 pg (ref 26.0–34.0)
MCHC: 31.2 g/dL (ref 30.0–36.0)
MCV: 93.9 fL (ref 80.0–100.0)
Monocytes Absolute: 0.7 10*3/uL (ref 0.1–1.0)
Monocytes Relative: 10 %
Neutro Abs: 4.2 10*3/uL (ref 1.7–7.7)
Neutrophils Relative %: 63 %
Platelets: 191 10*3/uL (ref 150–400)
RBC: 3.11 MIL/uL — ABNORMAL LOW (ref 4.22–5.81)
RDW: 15 % (ref 11.5–15.5)
WBC: 6.7 10*3/uL (ref 4.0–10.5)
nRBC: 0 % (ref 0.0–0.2)

## 2022-09-30 LAB — BASIC METABOLIC PANEL
Anion gap: 7 (ref 5–15)
BUN: 29 mg/dL — ABNORMAL HIGH (ref 8–23)
CO2: 22 mmol/L (ref 22–32)
Calcium: 7.3 mg/dL — ABNORMAL LOW (ref 8.9–10.3)
Chloride: 109 mmol/L (ref 98–111)
Creatinine, Ser: 1.99 mg/dL — ABNORMAL HIGH (ref 0.61–1.24)
GFR, Estimated: 34 mL/min — ABNORMAL LOW (ref >60)
Glucose, Bld: 130 mg/dL — ABNORMAL HIGH (ref 70–99)
Potassium: 3.2 mmol/L — ABNORMAL LOW (ref 3.5–5.1)
Sodium: 138 mmol/L (ref 135–145)

## 2022-09-30 MED ORDER — POTASSIUM CHLORIDE CRYS ER 20 MEQ PO TBCR
40.0000 meq | EXTENDED_RELEASE_TABLET | Freq: Two times a day (BID) | ORAL | Status: DC
  Administered 2022-09-30: 40 meq via ORAL
  Filled 2022-09-30: qty 2

## 2022-09-30 MED ORDER — LOPERAMIDE HCL 2 MG PO CAPS
4.0000 mg | ORAL_CAPSULE | ORAL | Status: DC | PRN
  Administered 2022-09-30: 4 mg via ORAL
  Filled 2022-09-30: qty 2

## 2022-09-30 NOTE — Progress Notes (Signed)
Reviewed discharge instructions including medications and follow up appointments with patient and spouse. Patient and spouse verbalized understanding.   Stitches to posterior neck area removed by M.Bullins,RN (per order) Devaeh Amadi, Laurel Dimmer, RN

## 2022-09-30 NOTE — Discharge Summary (Addendum)
Physician Discharge Summary   Patient: [name] MRN: [id number] DOB: 1945/10/24  Admit date:     09/23/2022  Discharge date: 09/30/22  Discharge Physician: [place]   PCP: [place]   Recommendations at discharge:    Follow up with GI for upper and lower endoscopy. Follow up with PCP  Discharge Diagnoses: Principal Problem:   AKI (acute kidney injury) ([place]) Active Problems:   PTSD (post-traumatic stress disorder)   GAIT IMBALANCE   CAD (coronary artery disease)   Seizure disorder ([place])   Tardive dyskinesia   History of CVA (cerebrovascular accident)   IBS (irritable bowel syndrome) with episodic urgent diarrhea   Diabetes mellitus ([place])   AAA (abdominal aortic aneurysm) without rupture (HCC)   Prolonged QT interval   BPH (benign prostatic hyperplasia)   Chronic diastolic CHF (congestive heart failure) (HCC)   Chronic kidney disease, stage 3a ([place])  Resolved Problems:   * No resolved hospital problems. *  Hospital Course: 76 y.o. male with PMH of IBS with diarrhea, duodenal ulcer, CVA, tardive dyskinesia, seizure disorder, PTSD, prolonged QT, gait abnormality, DM-2, diastolic CHF, CAD/CABG, orthostatic hypotension, BPH and AAA s/p aorto bi-iliac stent graft presenting with 2 days of abdominal pain and diarrhea, and admitted for AKI and sigmoid colitis.  Started on IV fluid, IV ceftriaxone and IV Flagyl.  Hemoccult positive.  C. difficile and GIP negative.  Hemoccult positive.  Improving.  GI on board and recommended outpatient EGD/ colonoscopy.    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 Dehydration and diarrhea vs rhabdomyolysis.  CT A/P without obstruction.  Had to stop the IV fluids due to bilateral pleural effusion.  Continue to check  renal parameters.  Baseline creatinine around 1.6 to 2. He was admitted with a creatinine of 5.15 and creatinine improved to 2.83  to 2.42  to 2.4 to 2.2  today.  Sodium  bicarbonate added for acidosis and bicarb improved to 18 today.      Sigmoid colitis:  -  only oe episode of BM , no abdominal pain.  - advanced diet to soft and is able to tolerate without any nausea, vomiting or abdominal pain.  - GI consulted and recommended outpatient EGD/ colonoscopy in 6 to 8 weeks.      Normocytic anemia/ iron deficiency anemia: - positive hemoccult. ? From colitis.  - continue with PPI.  - hemoglobin stable around 9.  - transfuse to keep hemoglobin greater than 7.  - recommend iron supplementation on discharge .  - anemia panel shows adequate b12 and folate.      H/o CVA:  - therapy evaluation ordered .     Seizure disorder; None this admission.      Tardive dyskinesia;  None this admission.      CAD s/p CABG  No chest pain.  Was on plavix. Resume it.        Acute systolic heart failure.  BNP elevated , bilateral pleural effusions, and dyspnea on exertion.  He denies any chest pain and sob today.  CT chest without contrast showed  Moderate right and small left pleural effusions with adjacent compressive atelectasis in the lower lobes, new compared to 09/23/2022. Multifocal ground-glass consolidations in the upper lobes, favored to represent multifocal pneumonia. Mildly enlarged right paratracheal and right hilar nodes, likely reactive.   Echocardiogram ordered and showed Global hypokinesis worse in the inferior base and septum . Left ventricular ejection fraction, is 30 to 35%.  The left ventricle has moderately decreased function. The left ventricle demonstrates global hypokinesis. The left ventricular internal cavity size was mildly dilated.   Started the patient on low dose lasix, appears to have diuresed well.  check strict intake and output.  [place] oxygen to keep sats greater than 90%. Patient currently on RA.      Multifocal Pneumonia: Completed the course of antibiotics.  Will request SLP eval for aspiration pneumonia.  [place] oxygen to keep  sats greater 90%.  Duonebs as needed.  Patient is on RA and stable.        Type 2 DM Not on meds.  Hemoglobin A1c ordered and pending.  Gabapentin for diabetic neuropathy.       AAA s/p aorto bi-iliac stent graft     BPH:  On finasteride.       Moderate Malnutrition related to chronic illness (IBS, PTSD, CHF, CAD) as evidenced by moderate fat depletion, moderate muscle depletion, severe muscle depletion.             Consultants: GI Procedures performed: None  Disposition: Home Diet recommendation:  Discharge Diet Orders (From admission, onward)     Start     Ordered   09/28/22 0000  Diet - low sodium heart healthy        09/28/22 1208           Carb modified diet DISCHARGE MEDICATION: Allergies as of 09/30/2022       Reactions   Aspirin Other (See Comments)   GI REACTION   Donepezil Nausea And Vomiting   Galantamine Other (See Comments)   Feeling agitated   Memantine Other (See Comments)   Feeling agitated   Omeprazole Other (See Comments)   Indigestion   Terazosin Other (See Comments)   Low blood pressure        Medication List     STOP taking these medications    amLODipine 10 MG tablet Commonly known as: NORVASC   METAMUCIL PO       TAKE these medications    atorvastatin 40 MG tablet Commonly known as: LIPITOR Take 20 mg by mouth at bedtime.   cloNIDine 0.1 MG tablet Commonly known as: CATAPRES Take 1 tablet (0.1 mg total) by mouth every 8 (eight) hours. Hold of hr < 60/min or sbp <120 What changed:  when to take this additional instructions   clopidogrel 75 MG tablet Commonly known as: PLAVIX Take 1 tablet (75 mg total) by mouth daily.   cyanocobalamin 1000 MCG/ML injection Commonly known as: VITAMIN B12 Inject 1,000 mcg into the skin every 30 (thirty) days.   doxylamine (Sleep) 25 MG tablet Commonly known as: UNISOM Take 25 mg by mouth at bedtime as needed for sleep.   ferrous gluconate 324 MG tablet Commonly  known as: FERGON Take 324 mg by mouth every Monday, Wednesday, and Friday.   finasteride 5 MG tablet Commonly known as: PROSCAR Take 5 mg by mouth daily.   FLUoxetine 20 MG capsule Commonly known as: PROZAC Take 40 mg by mouth daily.   furosemide 20 MG tablet Commonly known as: Lasix Take 1 tablet (20 mg total) by mouth daily.   gabapentin 300 MG capsule Commonly known as: NEURONTIN Take 300 mg by mouth in the morning and at bedtime.   hydrocerin Crea Apply 1 application topically 3 (three) times daily as needed (dry legs/dry skin).   lisinopril 20 MG tablet Commonly known as: ZESTRIL Take 20 mg by mouth daily.   loperamide 2 MG  tablet Commonly known as: IMODIUM A-D Take 2 mg by mouth 4 (four) times daily as needed for diarrhea or loose stools.   mirtazapine 15 MG tablet Commonly known as: REMERON Take 45 mg by mouth at bedtime.   pantoprazole 40 MG tablet Commonly known as: PROTONIX Take 1 tablet (40 mg total) by mouth daily before breakfast.   rivastigmine 9.5 mg/24hr Commonly known as: EXELON APPLY 1 PATCH EXTERNALLY TO THE SKIN EVERY DAY AS DIRECTED What changed:  how much to take how to take this when to take this additional instructions        Discharge Exam: Filed Weights   09/23/22 1023  Weight: 69 kg     Condition at discharge: good  The results of significant diagnostics from this hospitalization (including imaging, microbiology, ancillary and laboratory) are listed below for reference.   Imaging Studies: DG CHEST PORT 1 VIEW  Result Date: 09/27/2022 CLINICAL DATA:  Follow-up of pneumonia EXAM: PORTABLE CHEST 1 VIEW COMPARISON:  Chest radiograph done on 09/24/2022, CT chest done on 09/25/2022 FINDINGS: Transverse diameter of heart is increased. There is previous coronary bypass surgery. Central pulmonary vessels are prominent. Small patchy infiltrate is seen in medial right upper lung field overlying the upper aspect of right hilum with no  significant change. There are linear densities in both lower lung fields. There is blunting of lateral CP angles. There is no pneumothorax. IMPRESSION: Cardiomegaly. Central pulmonary vessels are more prominent suggesting possible mild CHF. There are no signs of alveolar pulmonary edema. Linear densities in the lower lung fields suggest subsegmental atelectasis. There is no focal pulmonary consolidation. Minimal bilateral pleural effusions. Electronically Signed   By: Elmer Picker M.D.   On: 09/27/2022 11:33   ECHOCARDIOGRAM COMPLETE  Result Date: 09/26/2022    ECHOCARDIOGRAM REPORT   Patient Name:   ISACC TURNEY Date of Exam: 09/26/2022 Medical Rec #:  [id number]       Height:       69.0 in Accession #:    [id number]      Weight:       152.1 lb Date of Birth:  Mar 14, 1946       BSA:          1.839 m Patient Age:    25 years        BP:           137/74 mmHg Patient Gender: M               HR:           104 bpm. Exam Location:  Inpatient Procedure: 2D Echo and Intracardiac Opacification Agent Indications:    dyspnea  History:        Patient has prior history of Echocardiogram examinations, most                 recent 12/07/2011. CAD, Stroke; Risk Factors:Hypertension,                 Diabetes and Dyslipidemia.  Sonographer:    Harvie Junior Referring Phys: Theola Sequin  Sonographer Comments: Technically difficult study due to poor echo windows. IMPRESSIONS  1. Global hypokinesis worse in the inferior base and septum . Left ventricular ejection fraction, by estimation, is 30 to 35%. The left ventricle has moderately decreased function. The left ventricle demonstrates global hypokinesis. The left ventricular  internal cavity size was mildly dilated. Left ventricular diastolic parameters were normal.  2. Right ventricular systolic function is normal. The right  ventricular size is normal.  3. The mitral valve is abnormal. Trivial mitral valve regurgitation. No evidence of mitral stenosis.  4. The aortic  valve is tricuspid. There is mild calcification of the aortic valve. There is mild thickening of the aortic valve. Aortic valve regurgitation is not visualized. Aortic valve sclerosis is present, with no evidence of aortic valve stenosis.  5. The inferior vena cava is normal in size with greater than 50% respiratory variability, suggesting right atrial pressure of 3 mmHg. FINDINGS  Left Ventricle: Global hypokinesis worse in the inferior base and septum. Left ventricular ejection fraction, by estimation, is 30 to 35%. The left ventricle has moderately decreased function. The left ventricle demonstrates global hypokinesis. Definity  contrast agent was given IV to delineate the left ventricular endocardial borders. The left ventricular internal cavity size was mildly dilated. There is no left ventricular hypertrophy. Left ventricular diastolic parameters were normal. Right Ventricle: The right ventricular size is normal. No increase in right ventricular wall thickness. Right ventricular systolic function is normal. Left Atrium: Left atrial size was normal in size. Right Atrium: Right atrial size was normal in size. Pericardium: There is no evidence of pericardial effusion. Mitral Valve: The mitral valve is abnormal. There is mild thickening of the mitral valve leaflet(s). There is mild calcification of the mitral valve leaflet(s). Mild mitral annular calcification. Trivial mitral valve regurgitation. No evidence of mitral valve stenosis. Tricuspid Valve: The tricuspid valve is normal in structure. Tricuspid valve regurgitation is mild . No evidence of tricuspid stenosis. Aortic Valve: The aortic valve is tricuspid. There is mild calcification of the aortic valve. There is mild thickening of the aortic valve. Aortic valve regurgitation is not visualized. Aortic valve sclerosis is present, with no evidence of aortic valve stenosis. Aortic valve mean gradient measures 3.0 mmHg. Aortic valve peak gradient measures 5.0  mmHg. Aortic valve area, by VTI measures 4.07 cm. Pulmonic Valve: The pulmonic valve was normal in structure. Pulmonic valve regurgitation is not visualized. No evidence of pulmonic stenosis. Aorta: The aortic root is normal in size and structure. Venous: The inferior vena cava is normal in size with greater than 50% respiratory variability, suggesting right atrial pressure of 3 mmHg. IAS/Shunts: The interatrial septum was not well visualized.  LEFT VENTRICLE PLAX 2D LVIDd:         5.60 cm      Diastology LVIDs:         4.50 cm      LV e' medial:    11.10 cm/s LV PW:         1.10 cm      LV E/e' medial:  10.1 LV IVS:        1.10 cm      LV e' lateral:   9.03 cm/s LVOT diam:     2.40 cm      LV E/e' lateral: 12.4 LV SV:         70 LV SV Index:   38 LVOT Area:     4.52 cm  LV Volumes (MOD) LV vol d, MOD A2C: 182.0 ml LV vol d, MOD A4C: 141.0 ml LV vol s, MOD A2C: 125.0 ml LV vol s, MOD A4C: 92.1 ml LV SV MOD A2C:     57.0 ml LV SV MOD A4C:     141.0 ml LV SV MOD BP:      50.7 ml RIGHT VENTRICLE RV Basal diam:  4.30 cm RV Mid diam:    3.90 cm RV S  prime:     11.30 cm/s TAPSE (M-mode): 1.6 cm LEFT ATRIUM             Index        RIGHT ATRIUM           Index LA diam:        3.40 cm 1.85 cm/m   RA Area:     17.10 cm LA Vol (A2C):   46.9 ml 25.50 ml/m  RA Volume:   47.50 ml  25.83 ml/m LA Vol (A4C):   49.8 ml 27.08 ml/m LA Biplane Vol: 49.9 ml 27.13 ml/m  AORTIC VALVE                    PULMONIC VALVE AV Area (Vmax):    4.60 cm     PV Vmax:       0.83 m/s AV Area (Vmean):   4.32 cm     PV Peak grad:  2.7 mmHg AV Area (VTI):     4.07 cm AV Vmax:           112.00 cm/s AV Vmean:          77.200 cm/s AV VTI:            0.171 m AV Peak Grad:      5.0 mmHg AV Mean Grad:      3.0 mmHg LVOT Vmax:         114.00 cm/s LVOT Vmean:        73.800 cm/s LVOT VTI:          0.154 m LVOT/AV VTI ratio: 0.90  AORTA Ao Root diam: 3.40 cm MITRAL VALVE MV Area (PHT): 6.17 cm     SHUNTS MV Decel Time: 123 msec     Systemic VTI:  0.15  m MR Peak grad: 27.6 mmHg     Systemic Diam: 2.40 cm MR Vmax:      262.50 cm/s MV E velocity: 112.00 cm/s MV A velocity: 42.70 cm/s MV E/A ratio:  2.62 Jenkins Rouge MD Electronically signed by Jenkins Rouge MD Signature Date/Time: 09/26/2022/9:31:17 AM    Final    US Abdomen Limited  Result Date: 09/25/2022 CLINICAL DATA:  Ascites. EXAM: ULTRASOUND ABDOMEN LIMITED COMPARISON:  CT abdomen pelvis 09/23/2022 FINDINGS: Sonographic survey of the abdomen was performed.  No free fluid. IMPRESSION: No ascites on sonographic survey of the abdomen. Electronically Signed   By: Ileana Roup M.D.   On: 09/25/2022 14:41   CT CHEST WO CONTRAST  Result Date: 09/25/2022 CLINICAL DATA:  Respiratory illness. New right suprahilar density on chest radiograph 09/24/2022. EXAM: CT CHEST WITHOUT CONTRAST TECHNIQUE: Multidetector CT imaging of the chest was performed following the standard protocol without IV contrast. RADIATION DOSE REDUCTION: This exam was performed according to the departmental dose-optimization program which includes automated exposure control, adjustment of the mA and/or kV according to patient size and/or use of iterative reconstruction technique. COMPARISON:  Chest radiograph 09/24/2022; CT abdomen pelvis 09/23/2022 FINDINGS: Patient respiratory motion degrades image quality. Cardiovascular: Postoperative changes of coronary artery bypass graft. Aortic calcifications. Heart is normal in size. No pericardial effusion. Mediastinum/Nodes: Enlarged right paratracheal and right hilar nodes both measure 1.2 cm short axis (series 2, image 49, 64). No other mediastinal lymphadenopathy. No definite left hilar adenopathy, though evaluation is limited due to lack of intravenous contrast. No axillary lymphadenopathy. The trachea, thyroid gland, esophagus demonstrate no significant abnormalities. Lungs/Pleura: Moderate right and small left pleural effusions with adjacent compressive atelectasis in  the lower lobes, new  compared to 09/23/2022. Multifocal ground-glass consolidations in the upper lobes. No pneumothorax. Upper Abdomen: No acute abnormality. Cholecystectomy. Right renal cyst as described on recent CT abdomen pelvis exam. No follow-up imaging is indicated. Musculoskeletal: Postoperative changes of median sternotomy. Bilateral gynecomastia. Old fractures of the posterior left eighth through tenth ribs. No acute or suspicious osseous finding. IMPRESSION: 1. Moderate right and small left pleural effusions with adjacent compressive atelectasis in the lower lobes, new compared to 09/23/2022. 2. Multifocal ground-glass consolidations in the upper lobes, favored to represent multifocal pneumonia. 3. Mildly enlarged right paratracheal and right hilar nodes, likely reactive. 4. Aortic atherosclerosis. Aortic Atherosclerosis (ICD10-I70.0). Electronically Signed   By: Ileana Roup M.D.   On: 09/25/2022 12:56   DG Chest Port 1 View  Result Date: 09/24/2022 CLINICAL DATA:  Wheezing EXAM: PORTABLE CHEST 1 VIEW COMPARISON:  11/16/2021 FINDINGS: The cardio pericardial silhouette is enlarged. There is pulmonary vascular congestion without overt pulmonary edema. New right suprahilar density identified which could be related to infection/inflammation although hilar lymphadenopathy. Central right lung lesion not excluded. No pleural effusion. The visualized bony structures of the thorax are unremarkable. IMPRESSION: New right suprahilar density which could be related to infection/inflammation although hilar lymphadenopathy not excluded. Central right lung lesion also a consideration. CT chest with contrast recommended to further evaluate. Electronically Signed   By: Misty Stanley M.D.   On: 09/24/2022 16:33   DG Abd Portable 1V  Result Date: 09/24/2022 CLINICAL DATA:  Abdominal distention EXAM: PORTABLE ABDOMEN - 1 VIEW COMPARISON:  None Available. FINDINGS: Mildly distended prominent loops of small and large bowel. Bibasilar  atelectasis. Aorto bi-iliac stent. No acute osseous abnormality. IMPRESSION: Mildly distended loops of small and large bowel, may represent ileus. Electronically Signed   By: Yetta Glassman M.D.   On: 09/24/2022 16:22   CT ABDOMEN PELVIS WO CONTRAST  Result Date: 09/23/2022 CLINICAL DATA:  Abdominal pain, diarrhea EXAM: CT ABDOMEN AND PELVIS WITHOUT CONTRAST TECHNIQUE: Multidetector CT imaging of the abdomen and pelvis was performed following the standard protocol without IV contrast. RADIATION DOSE REDUCTION: This exam was performed according to the departmental dose-optimization program which includes automated exposure control, adjustment of the mA and/or kV according to patient size and/or use of iterative reconstruction technique. COMPARISON:  11/16/2021 FINDINGS: Lower chest: Heart is enlarged in size. Coronary artery calcifications are seen. There is slight increase in interstitial markings in the periphery of lower lung fields, possibly minimal scarring or passive congestive change. Metallic sutures are seen in the sternum. Hepatobiliary: There is no dilation of bile ducts. Surgical clips are seen in gallbladder fossa. Pancreas: No focal abnormalities are seen. Spleen: Unremarkable. Adrenals/Urinary Tract: Adrenals are unremarkable. There is no hydronephrosis. There is 5.3 cm cyst in the upper pole of right kidney. There are foci of cortical thinning in the kidneys. There are scattered calcifications in the renal artery branches. There is 2 mm calcific density in the midportion of right kidney, possibly nonobstructing renal stone or calcification in a small renal artery branch. Ureters are not dilated. There is contrast in the lumen of the bladder suggesting recent contrast administration. Stomach/Bowel: Small hiatal hernia is seen. Stomach is not distended. Small bowel loops are not dilated. Appendix is not dilated. There is mild diffuse wall thickening in sigmoid colon. Scattered diverticula are  seen in colon. There is no loculated pericolic fluid collection. Vascular/Lymphatic: There is infrarenal aortic aneurysm measuring 5.7 cm with no significant interval change. There is previous aorto bi-iliac  stent graft. Extensive calcifications are seen in aorta and its major branches. Reproductive: There are coarse calcifications in prostate. Prostate is enlarged projecting into the base of the bladder. Other: There is no ascites or pneumoperitoneum. Gynecomastia is seen in both breasts. Musculoskeletal: Degenerative changes are noted in lumbar spine. IMPRESSION: There is no evidence of intestinal obstruction or pneumoperitoneum. Appendix is not dilated. There is no hydronephrosis. There is diffuse wall thickening in sigmoid colon suggesting inflammatory or infectious colitis. Scattered diverticula are seen in colon without signs of focal acute diverticulitis. Small hiatal hernia. Right renal cysts. Aortic atherosclerosis. There is aneurism in infrarenal abdominal aorta with no significant interval change. There is previous aorto bi-iliac stent graft. There is subtle interval increase in interstitial markings in the periphery of both lower lung fields suggesting possible interstitial pneumonia or scarring. Coronary artery calcifications are seen. Other findings as described in the body of the report. Electronically Signed   By: Elmer Picker M.D.   On: 09/23/2022 15:12    Microbiology: Results for orders placed or performed during the hospital encounter of 09/23/22  C Difficile Quick Screen w PCR reflex     Status: None   Collection Time: 09/23/22  1:00 PM   Specimen: STOOL  Result Value Ref Range Status   C Diff antigen NEGATIVE NEGATIVE Final   C Diff toxin NEGATIVE NEGATIVE Final   C Diff interpretation No C. difficile detected.  Final    Comment: Performed at [facility], [place] [address], [place], [place] [address]  Gastrointestinal Panel by PCR , Stool     Status: None    Collection Time: 09/23/22  1:00 PM   Specimen: Stool  Result Value Ref Range Status   Campylobacter species NOT DETECTED NOT DETECTED Final   Plesimonas shigelloides NOT DETECTED NOT DETECTED Final   Salmonella species NOT DETECTED NOT DETECTED Final   Yersinia enterocolitica NOT DETECTED NOT DETECTED Final   Vibrio species NOT DETECTED NOT DETECTED Final   Vibrio cholerae NOT DETECTED NOT DETECTED Final   Enteroaggregative E coli (EAEC) NOT DETECTED NOT DETECTED Final   Enteropathogenic E coli (EPEC) NOT DETECTED NOT DETECTED Final   Enterotoxigenic E coli (ETEC) NOT DETECTED NOT DETECTED Final   Shiga like toxin producing E coli (STEC) NOT DETECTED NOT DETECTED Final   Shigella/Enteroinvasive E coli (EIEC) NOT DETECTED NOT DETECTED Final   Cryptosporidium NOT DETECTED NOT DETECTED Final   Cyclospora cayetanensis NOT DETECTED NOT DETECTED Final   Entamoeba histolytica NOT DETECTED NOT DETECTED Final   Giardia lamblia NOT DETECTED NOT DETECTED Final   Adenovirus F40/41 NOT DETECTED NOT DETECTED Final   Astrovirus NOT DETECTED NOT DETECTED Final   Norovirus GI/GII NOT DETECTED NOT DETECTED Final   Rotavirus A NOT DETECTED NOT DETECTED Final   Sapovirus (I, II, IV, and V) NOT DETECTED NOT DETECTED Final    Comment: Performed at [facility], [address] [place], [place] [address]  Urine Culture     Status: Abnormal   Collection Time: 09/23/22  2:00 PM   Specimen: Urine, Clean Catch  Result Value Ref Range Status   Specimen Description   Final    URINE, CLEAN CATCH Performed at [facility], [place] [address] [place], [place] [address]    Special Requests   Final    NONE Performed at [facility], [place] [address], [place], [place] [address]    Culture (A)  Final    <10,000 COLONIES/mL INSIGNIFICANT GROWTH  Performed at [facility], [place] [address] [place], [place] [address]    Report Status 09/24/2022 FINAL  Final     Labs: CBC: Recent Labs  Lab 09/26/22 0517 09/27/22 0538 09/28/22 0501 09/29/22 0657 09/30/22 0601  WBC 8.8 7.8 6.8 5.7 6.7  NEUTROABS 7.0 5.5 4.1 3.2 4.2  HGB 9.8* 10.1* 10.0* 10.0* 9.1*  HCT 30.2* 32.1* 31.2* 31.8* 29.2*  MCV 92.6 92.8 92.0 93.8 93.9  PLT 158 166 176 201 237   Basic Metabolic Panel: Recent Labs  Lab 09/24/22 0518 09/25/22 0653 09/26/22 0517 09/27/22 0538 09/28/22 0501 09/29/22 1351 09/30/22 0601  NA 134* 139 143 142 142 140 138  K 4.2 4.2 4.0 3.6 3.2* 3.5 3.2*  CL 108 113* 116* 113* 112* 111 109  CO2 12* 16* 18* 17* '22 22 22  '$ GLUCOSE 86 144* 121* 118* 145* 129* 130*  BUN 67* 49* 45* 39* 40* 31* 29*  CREATININE 3.91* 2.83* 2.42* 2.44* 2.29* 1.99* 1.99*  CALCIUM 7.3* 7.9* 8.0* 7.8* 7.5* 7.4* 7.3*  MG 2.0 1.9  --   --   --   --   --   PHOS 4.8* 3.8  --   --   --   --   --    Liver Function Tests: Recent Labs  Lab 09/23/22 1217 09/24/22 0518 09/25/22 0653  AST 29  --   --   ALT 21  --   --   ALKPHOS 61  --   --   BILITOT 0.7  --   --   PROT 6.9  --   --   ALBUMIN 3.7 2.7* 3.0*   CBG: No results for input(s): "GLUCAP" in the last 168 hours.  Discharge time spent: greater than 30 minutes.  Signed: Cristela Felt, MD [facility] 09/30/2022

## 2022-09-30 NOTE — Progress Notes (Signed)
Consultation Progress Note   Patient: [name] UVO:[id number] DOB: 1946-03-13 DOA: 09/23/2022 DOS: the patient was seen and examined on 09/30/2022 Primary service: Aisha Greenberger, Manfred Shirts, MD  Brief hospital course: 76 y.o. male with PMH of IBS with diarrhea, duodenal ulcer, CVA, tardive dyskinesia, seizure disorder, PTSD, prolonged QT, gait abnormality, DM-2, diastolic CHF, CAD/CABG, orthostatic hypotension, BPH and AAA s/p aorto bi-iliac stent graft presenting with 2 days of abdominal pain and diarrhea, and admitted for AKI and sigmoid colitis.  Started on IV fluid, IV ceftriaxone and IV Flagyl.  Hemoccult positive.  C. difficile and GIP negative.  Hemoccult positive.  Improving.  GI on board and recommended outpatient EGD/ colonoscopy.   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 Dehydration and diarrhea vs rhabdomyolysis.  CT A/P without obstruction.  Had to stop the IV fluids due to bilateral pleural effusion.  Continue to check  renal parameters.  Baseline creatinine around 1.6 to 2. He was admitted with a creatinine of 5.15 and creatinine improved to 2.83  to 2.42  to 2.4 to 2.2  today.  Sodium bicarbonate added for acidosis and bicarb improved to 18 today.      Sigmoid colitis:  -  only oe episode of BM , no abdominal pain.  - advanced diet to soft and is able to tolerate without any nausea, vomiting or abdominal pain.  - GI consulted and recommended outpatient EGD/ colonoscopy in 6 to 8 weeks.      Normocytic anemia/ iron deficiency anemia: - positive hemoccult. ? From colitis.  - continue with PPI.  - hemoglobin stable around 9.  - transfuse to keep hemoglobin greater than 7.  - recommend iron supplementation on discharge .  - anemia panel shows adequate b12 and folate.      H/o CVA:  - therapy evaluation ordered .     Seizure disorder; None this admission.      Tardive dyskinesia;  None this admission.      CAD s/p CABG  No chest  pain.  Was on plavix. Resume it.        Acute systolic heart failure.  BNP elevated , bilateral pleural effusions, and dyspnea on exertion.  He denies any chest pain and sob today.  CT chest without contrast showed  Moderate right and small left pleural effusions with adjacent compressive atelectasis in the lower lobes, new compared to 09/23/2022. Multifocal ground-glass consolidations in the upper lobes, favored to represent multifocal pneumonia. Mildly enlarged right paratracheal and right hilar nodes, likely reactive.   Echocardiogram ordered and showed Global hypokinesis worse in the inferior base and septum . Left ventricular ejection fraction, is 30 to 35%. The left ventricle has moderately decreased function. The left ventricle demonstrates global hypokinesis. The left ventricular internal cavity size was mildly dilated.   Started the patient on low dose lasix, appears to have diuresed well.  check strict intake and output.  [place] oxygen to keep sats greater than 90%. Patient currently on RA.      Multifocal Pneumonia: Complete the course of antibiotics.  Will request SLP eval for aspiration pneumonia.  [place] oxygen to keep sats greater 90%.  Duonebs as needed.  Patient is on RA and stable.        Type 2 DM Not on meds.  Hemoglobin A1c ordered and pending.  Gabapentin for diabetic neuropathy.       AAA s/p aorto bi-iliac stent graft     BPH:  On finasteride.  [facility] will continue to follow the patient.  Subjective: Patient has a mushy bowel movement this morning, family wants to recheck for C.diff but discussed how low yield it would be. He has low potassium levels today. Patient has no complaints.  Physical Exam: Vitals:   09/29/22 0446 09/29/22 1500 09/29/22 2046 09/30/22 0436  BP: 129/67 130/72 (!) 117/59 130/71  Pulse: 97 93 91 100  Resp: '20 14 20 20  '$ Temp: 98.6 F (37 C) 99.8 F (37.7 C) 99.6 F (37.6 C) 98.9 F (37.2 C)  TempSrc: Oral  Oral Oral   SpO2: 96% 99% 96% 97%  Weight:      Height:       General exam: Appears calm and comfortable  Respiratory system: Clear to auscultation. Respiratory effort normal. Cardiovascular system: S1 & S2 heard, RRR. No JVD, murmurs, rubs, gallops or clicks. No pedal edema. Gastrointestinal system: Abdomen is nondistended, soft and nontender. No organomegaly or masses felt. Normal bowel sounds heard. Central nervous system: Alert and oriented. No focal neurological deficits. Extremities: Symmetric 5 x 5 power. Skin: No rashes, lesions or ulcers Psychiatry: Judgement and insight appear normal. Mood & affect appropriate.    Data Reviewed:  There are no new results to review at this time.   Time spent: 15 minutes.  Author: Cristela Felt, MD 09/30/2022 10:22 AM  For on call review [email or web address].

## 2022-10-05 ENCOUNTER — Emergency Department (HOSPITAL_COMMUNITY)
Admission: EM | Admit: 2022-10-05 | Discharge: 2022-10-06 | Discharge status: Against Medical Advice | Payer: No Typology Code available for payment source | Attending: Emergency Medicine | Admitting: Emergency Medicine

## 2022-10-05 DIAGNOSIS — R109 Unspecified abdominal pain: Secondary | ICD-10-CM | POA: Insufficient documentation

## 2022-10-05 DIAGNOSIS — W06XXXA Fall from bed, initial encounter: Secondary | ICD-10-CM | POA: Insufficient documentation

## 2022-10-05 DIAGNOSIS — Z5321 Procedure and treatment not carried out due to patient leaving prior to being seen by health care provider: Secondary | ICD-10-CM | POA: Insufficient documentation

## 2022-10-05 DIAGNOSIS — Z7902 Long term (current) use of antithrombotics/antiplatelets: Secondary | ICD-10-CM | POA: Insufficient documentation

## 2022-10-05 DIAGNOSIS — M545 Low back pain, unspecified: Secondary | ICD-10-CM | POA: Diagnosis not present

## 2022-10-05 LAB — CBC WITH DIFFERENTIAL/PLATELET
Abs Immature Granulocytes: 0.04 10*3/uL (ref 0.00–0.07)
Basophils Absolute: 0 10*3/uL (ref 0.0–0.1)
Basophils Relative: 1 %
Eosinophils Absolute: 0.2 10*3/uL (ref 0.0–0.5)
Eosinophils Relative: 3 %
HCT: 27.6 % — ABNORMAL LOW (ref 39.0–52.0)
Hemoglobin: 8.2 g/dL — ABNORMAL LOW (ref 13.0–17.0)
Immature Granulocytes: 1 %
Lymphocytes Relative: 19 %
Lymphs Abs: 1.1 10*3/uL (ref 0.7–4.0)
MCH: 29.3 pg (ref 26.0–34.0)
MCHC: 29.7 g/dL — ABNORMAL LOW (ref 30.0–36.0)
MCV: 98.6 fL (ref 80.0–100.0)
Monocytes Absolute: 0.5 10*3/uL (ref 0.1–1.0)
Monocytes Relative: 8 %
Neutro Abs: 4.1 10*3/uL (ref 1.7–7.7)
Neutrophils Relative %: 68 %
Platelets: 195 10*3/uL (ref 150–400)
RBC: 2.8 MIL/uL — ABNORMAL LOW (ref 4.22–5.81)
RDW: 14.6 % (ref 11.5–15.5)
WBC: 5.9 10*3/uL (ref 4.0–10.5)
nRBC: 0 % (ref 0.0–0.2)

## 2022-10-05 LAB — BASIC METABOLIC PANEL
Anion gap: 8 (ref 5–15)
BUN: 23 mg/dL (ref 8–23)
CO2: 20 mmol/L — ABNORMAL LOW (ref 22–32)
Calcium: 7.3 mg/dL — ABNORMAL LOW (ref 8.9–10.3)
Chloride: 107 mmol/L (ref 98–111)
Creatinine, Ser: 2.38 mg/dL — ABNORMAL HIGH (ref 0.61–1.24)
GFR, Estimated: 27 mL/min — ABNORMAL LOW (ref >60)
Glucose, Bld: 99 mg/dL (ref 70–99)
Potassium: 4.3 mmol/L (ref 3.5–5.1)
Sodium: 135 mmol/L (ref 135–145)

## 2022-10-05 MED ORDER — ACETAMINOPHEN 325 MG PO TABS
325.0000 mg | ORAL_TABLET | Freq: Once | ORAL | Status: AC
  Administered 2022-10-05: 325 mg via ORAL
  Filled 2022-10-05: qty 1

## 2022-10-05 NOTE — ED Notes (Signed)
Pt IV took out, pt left AMA

## 2022-10-05 NOTE — ED Provider Triage Note (Signed)
Emergency Medicine Provider Triage Evaluation Note  [name] , a 77 y.o. male  was evaluated in triage.  Pt complains of fall.  [place] this morning while getting out of bed.  Landed on his buttock.  Now has bilateral flank pain and lower back pain.  Normally can ambulate without assistance.  Has not tried since EMS was called and delivered him here.  Denies head injury or loss of consciousness.  On Plavix.  Review of Systems  Positive: See above Negative: See above  Physical Exam  BP (!) 125/53 (BP Location: Right Arm)   Pulse 72   Temp 99.3 F (37.4 C) (Oral)   Resp 16   SpO2 99%  Gen:   Awake, no distress   Resp:  Normal effort  MSK:   Moves extremities without difficulty  Other:    Medical Decision Making  Medically screening exam initiated at 11:36 AM.  Appropriate orders placed.  [name] was informed that the remainder of the evaluation will be completed by another provider, this initial triage assessment does not replace that evaluation, and the importance of remaining in the ED until their evaluation is complete.  Work up started   Harriet Pho, PA-C 10/05/22 1140

## 2022-10-05 NOTE — ED Triage Notes (Signed)
Patient BIB [facility] from home for evaluation of flank pain, back pain, dysuria, and intermittent confusion. Discharged from [facility] on 12/27 after admission for heart failure and kidney disease. Patient has had at least seven falls over the last few days. Denies head injury, is on plavix. Patient is alert, currently oriented x4, and is in no apparent distress but family at home told EMS patient is sometimes disoriented.

## 2022-10-14 ENCOUNTER — Ambulatory Visit: Payer: No Typology Code available for payment source | Admitting: Physician Assistant

## 2022-11-24 ENCOUNTER — Encounter (HOSPITAL_BASED_OUTPATIENT_CLINIC_OR_DEPARTMENT_OTHER): Payer: Self-pay | Admitting: Emergency Medicine

## 2022-11-24 ENCOUNTER — Other Ambulatory Visit: Payer: Self-pay

## 2022-11-24 ENCOUNTER — Emergency Department (HOSPITAL_BASED_OUTPATIENT_CLINIC_OR_DEPARTMENT_OTHER)
Admission: EM | Admit: 2022-11-24 | Discharge: 2022-11-24 | Disposition: A | Discharge status: Home / Self Care | Payer: Non-veteran care | Attending: Emergency Medicine | Admitting: Emergency Medicine

## 2022-11-24 DIAGNOSIS — S91114A Laceration without foreign body of right lesser toe(s) without damage to nail, initial encounter: Secondary | ICD-10-CM | POA: Diagnosis not present

## 2022-11-24 DIAGNOSIS — S91115A Laceration without foreign body of left lesser toe(s) without damage to nail, initial encounter: Secondary | ICD-10-CM | POA: Insufficient documentation

## 2022-11-24 DIAGNOSIS — W311XXA Contact with metalworking machines, initial encounter: Secondary | ICD-10-CM | POA: Insufficient documentation

## 2022-11-24 DIAGNOSIS — S91119A Laceration without foreign body of unspecified toe without damage to nail, initial encounter: Secondary | ICD-10-CM

## 2022-11-24 DIAGNOSIS — S99922A Unspecified injury of left foot, initial encounter: Secondary | ICD-10-CM | POA: Diagnosis present

## 2022-11-24 NOTE — ED Notes (Addendum)
Wound seal applied. Bleeding controlled at this time. Wrapped in non adherant and gauze. Advised not to remove for a day.

## 2022-11-24 NOTE — ED Triage Notes (Signed)
Pt reports trimming his toenails yesterday and "cutting them too short." Pt has bleeding to multiple toes. Pt recently started on plavix.

## 2022-11-24 NOTE — ED Provider Notes (Signed)
[facility] Provider Note   CSN: [id number] Arrival date & time: 11/24/22  [id number]     History  Chief Complaint  Patient presents with   Toe Injury    [name] is a 77 y.o. male.  HPI 77 year old male presents with bilateral toe injuries.  He was cutting his toenails which he states were very long yesterday afternoon and ended up cutting them too short.  He was using an [employer] that his wife states was made for cutting metal materials. He has multiple areas that were bleeding that he states were bleeding a lot.  Were bleeding again this morning so he presents here for evaluation.  He is on Plavix. No systemic symptoms such as dizziness.  Home Medications Prior to Admission medications   Medication Sig Start Date End Date Taking? Authorizing Provider  atorvastatin (LIPITOR) 40 MG tablet Take 20 mg by mouth at bedtime.    [provider]  cloNIDine (CATAPRES) 0.1 MG tablet Take 1 tablet (0.1 mg total) by mouth every 8 (eight) hours. Hold of hr < 60/min or sbp <120 Patient taking differently: Take 0.1 mg by mouth See admin instructions. Take 0.1 mg by mouth every eight hours and hold if hr < 60/min or sbp <120 11/20/21   [name], MD  clopidogrel (PLAVIX) 75 MG tablet Take 1 tablet (75 mg total) by mouth daily. 06/28/13   [name], MD  cyanocobalamin (,VITAMIN B-12,) 1000 MCG/ML injection Inject 1,000 mcg into the skin every 30 (thirty) days. 05/15/13   [provider]  doxylamine, Sleep, (UNISOM) 25 MG tablet Take 25 mg by mouth at bedtime as needed for sleep.    [provider]  ferrous gluconate (FERGON) 324 MG tablet Take 324 mg by mouth every Monday, Wednesday, and Friday.    [provider]  finasteride (PROSCAR) 5 MG tablet Take 5 mg by mouth daily.  03/13/13   [provider]  FLUoxetine (PROZAC) 20 MG capsule Take 40 mg by mouth daily. 08/24/15   [provider]   FLUoxetine (PROZAC) 40 MG capsule Take 40 mg by mouth daily. 10/15/22   [provider]  furosemide (LASIX) 20 MG tablet Take 1 tablet (20 mg total) by mouth daily. 09/28/22 11/27/22  [name], MD  gabapentin (NEURONTIN) 300 MG capsule Take 300 mg by mouth in the morning and at bedtime.    [provider]  hydrocerin (EUCERIN) CREA Apply 1 application topically 3 (three) times daily as needed (dry legs/dry skin).    [provider]  lactobacilus acidophilus & bulgar (FLORANEX) TABS chewable tablet Take 1 tablet by mouth daily. 10/15/22   [provider]  lisinopril (ZESTRIL) 20 MG tablet Take 20 mg by mouth daily.    [provider]  loperamide (IMODIUM A-D) 2 MG tablet Take 2 mg by mouth 4 (four) times daily as needed for diarrhea or loose stools.    [provider]  mirtazapine (REMERON) 15 MG tablet Take 45 mg by mouth at bedtime.    [provider]  mirtazapine (REMERON) 45 MG tablet Take 45 mg by mouth at bedtime. 10/15/22   [provider]  pantoprazole (PROTONIX) 40 MG tablet Take 1 tablet (40 mg total) by mouth daily before breakfast. 09/28/22   [name], MD  REGULOID 51.7 % POWD Take by mouth. 10/15/22   [provider]  rivastigmine (EXELON) 9.5 mg/24hr APPLY 1 PATCH EXTERNALLY TO THE SKIN EVERY DAY  AS DIRECTED Patient taking differently: Place 9.5 mg onto the skin daily. 10/27/15   Ward Givens, NP  metoprolol (LOPRESSOR) 50 MG tablet Take 0.5 tablets (25 mg total) by mouth 2 (two) times daily. 03/05/11 12/09/11  [name], MD      Allergies    Aspirin, Donepezil, Galantamine, Memantine, Omeprazole, and Terazosin    Review of Systems   Review of Systems  Musculoskeletal:  Negative for arthralgias.  Skin:  Positive for wound.  Neurological:  Negative for weakness and numbness.    Physical Exam Updated Vital Signs BP (!) 181/59 (BP Location: Right Arm)   Pulse (!) 53   Temp 98.5 F (36.9  C) (Oral)   Resp 16   SpO2 99%  Physical Exam Vitals and nursing note reviewed.  Constitutional:      Appearance: He is well-developed.  HENT:     Head: Normocephalic and atraumatic.  Cardiovascular:     Rate and Rhythm: Normal rate and regular rhythm.     Pulses:          Dorsalis pedis pulses are 2+ on the right side and 2+ on the left side.  Pulmonary:     Effort: Pulmonary effort is normal.  Musculoskeletal:     Comments: There are multiple superficial wounds/tip of toe injuries on both feet (primarily 3rd and 5th on left and 3rd on right). There are less severe superficial wounds on 2nd and 4th digits of left toe. On 3rd digit of left toe there is minimal active bleeding/oozing. Multiple nails were cut quite short. No repairable laceration.  Skin:    General: Skin is warm and dry.  Neurological:     Mental Status: He is alert.    ED Results / Procedures / Treatments   Labs (all labs ordered are listed, but only abnormal results are displayed) Labs Reviewed - No data to display  EKG None  Radiology No results found.  Procedures Procedures    Medications Ordered in ED Medications - No data to display  ED Course/ Medical Decision Making/ A&P                             Medical Decision Making  Wounds were cleaned by the nursing staff.  Some topical hemostatic images were put on the third toe given some continued mild oozing.  Otherwise there does not appear to be an obvious laceration that needs repair.  I have encouraged him to follow-up with podiatry if he is having that much difficulty cutting his nails.  Otherwise, he appears systemically well and has no systemic symptoms to suggest significant blood loss.  He is hypertensive though did not take his meds this morning.  Will otherwise discharge with return precautions.        Final Clinical Impression(s) / ED Diagnoses Final diagnoses:  Laceration of toe without foreign body present, nail damage status  unspecified, unspecified laterality, unspecified toe, initial encounter    Rx / DC Orders ED Discharge Orders     None         Sherwood Gambler, MD 11/24/22 1058

## 2022-11-24 NOTE — Discharge Instructions (Addendum)
Clean your toes with soap and water. You can also apply over the counter antibiotic ointment to the wounds.  You can follow-up with podiatry as needed or for further toe maintenance.

## 2022-12-03 ENCOUNTER — Emergency Department (HOSPITAL_COMMUNITY): Payer: No Typology Code available for payment source

## 2022-12-03 ENCOUNTER — Emergency Department (HOSPITAL_COMMUNITY)
Admission: EM | Admit: 2022-12-03 | Discharge: 2022-12-03 | Disposition: A | Discharge status: Home / Self Care | Payer: No Typology Code available for payment source | Attending: Emergency Medicine | Admitting: Emergency Medicine

## 2022-12-03 DIAGNOSIS — S59912A Unspecified injury of left forearm, initial encounter: Secondary | ICD-10-CM | POA: Diagnosis present

## 2022-12-03 DIAGNOSIS — W19XXXA Unspecified fall, initial encounter: Secondary | ICD-10-CM | POA: Diagnosis not present

## 2022-12-03 DIAGNOSIS — S51812A Laceration without foreign body of left forearm, initial encounter: Secondary | ICD-10-CM | POA: Insufficient documentation

## 2022-12-03 DIAGNOSIS — R296 Repeated falls: Secondary | ICD-10-CM | POA: Diagnosis not present

## 2022-12-03 DIAGNOSIS — I10 Essential (primary) hypertension: Secondary | ICD-10-CM | POA: Diagnosis not present

## 2022-12-03 DIAGNOSIS — J811 Chronic pulmonary edema: Secondary | ICD-10-CM | POA: Diagnosis not present

## 2022-12-03 DIAGNOSIS — R0989 Other specified symptoms and signs involving the circulatory and respiratory systems: Secondary | ICD-10-CM

## 2022-12-03 DIAGNOSIS — J449 Chronic obstructive pulmonary disease, unspecified: Secondary | ICD-10-CM | POA: Insufficient documentation

## 2022-12-03 DIAGNOSIS — Z7902 Long term (current) use of antithrombotics/antiplatelets: Secondary | ICD-10-CM | POA: Diagnosis not present

## 2022-12-03 DIAGNOSIS — Y92019 Unspecified place in single-family (private) house as the place of occurrence of the external cause: Secondary | ICD-10-CM | POA: Insufficient documentation

## 2022-12-03 DIAGNOSIS — Z79899 Other long term (current) drug therapy: Secondary | ICD-10-CM | POA: Diagnosis not present

## 2022-12-03 LAB — CBC WITH DIFFERENTIAL/PLATELET
Abs Immature Granulocytes: 0.03 10*3/uL (ref 0.00–0.07)
Basophils Absolute: 0 10*3/uL (ref 0.0–0.1)
Basophils Relative: 1 %
Eosinophils Absolute: 0.3 10*3/uL (ref 0.0–0.5)
Eosinophils Relative: 6 %
HCT: 30 % — ABNORMAL LOW (ref 39.0–52.0)
Hemoglobin: 9.4 g/dL — ABNORMAL LOW (ref 13.0–17.0)
Immature Granulocytes: 1 %
Lymphocytes Relative: 29 %
Lymphs Abs: 1.7 10*3/uL (ref 0.7–4.0)
MCH: 30.7 pg (ref 26.0–34.0)
MCHC: 31.3 g/dL (ref 30.0–36.0)
MCV: 98 fL (ref 80.0–100.0)
Monocytes Absolute: 0.5 10*3/uL (ref 0.1–1.0)
Monocytes Relative: 8 %
Neutro Abs: 3.3 10*3/uL (ref 1.7–7.7)
Neutrophils Relative %: 55 %
Platelets: 155 10*3/uL (ref 150–400)
RBC: 3.06 MIL/uL — ABNORMAL LOW (ref 4.22–5.81)
RDW: 14.4 % (ref 11.5–15.5)
WBC: 5.9 10*3/uL (ref 4.0–10.5)
nRBC: 0 % (ref 0.0–0.2)

## 2022-12-03 LAB — COMPREHENSIVE METABOLIC PANEL
ALT: 13 U/L (ref 0–44)
AST: 15 U/L (ref 15–41)
Albumin: 3.5 g/dL (ref 3.5–5.0)
Alkaline Phosphatase: 75 U/L (ref 38–126)
Anion gap: 7 (ref 5–15)
BUN: 33 mg/dL — ABNORMAL HIGH (ref 8–23)
CO2: 23 mmol/L (ref 22–32)
Calcium: 8.7 mg/dL — ABNORMAL LOW (ref 8.9–10.3)
Chloride: 105 mmol/L (ref 98–111)
Creatinine, Ser: 2.24 mg/dL — ABNORMAL HIGH (ref 0.61–1.24)
GFR, Estimated: 29 mL/min — ABNORMAL LOW (ref >60)
Glucose, Bld: 125 mg/dL — ABNORMAL HIGH (ref 70–99)
Potassium: 5.1 mmol/L (ref 3.5–5.1)
Sodium: 135 mmol/L (ref 135–145)
Total Bilirubin: 0.4 mg/dL (ref 0.3–1.2)
Total Protein: 6.5 g/dL (ref 6.5–8.1)

## 2022-12-03 LAB — URINALYSIS, ROUTINE W REFLEX MICROSCOPIC
Bilirubin Urine: NEGATIVE
Glucose, UA: NEGATIVE mg/dL
Hgb urine dipstick: NEGATIVE
Ketones, ur: NEGATIVE mg/dL
Leukocytes,Ua: NEGATIVE
Nitrite: NEGATIVE
Protein, ur: NEGATIVE mg/dL
Specific Gravity, Urine: 1.008 (ref 1.005–1.030)
pH: 6 (ref 5.0–8.0)

## 2022-12-03 LAB — TROPONIN I (HIGH SENSITIVITY): Troponin I (High Sensitivity): 20 ng/L — ABNORMAL HIGH (ref <18)

## 2022-12-03 LAB — BLOOD GAS, VENOUS
Acid-base deficit: 1.6 mmol/L (ref 0.0–2.0)
Bicarbonate: 24.7 mmol/L (ref 20.0–28.0)
O2 Saturation: 36.5 %
Patient temperature: 37
pCO2, Ven: 48 mmHg (ref 44–60)
pH, Ven: 7.32 (ref 7.25–7.43)
pO2, Ven: <31 mmHg — CL (ref 32–45)

## 2022-12-03 LAB — BRAIN NATRIURETIC PEPTIDE: B Natriuretic Peptide: 719.5 pg/mL — ABNORMAL HIGH (ref 0.0–100.0)
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
## 2022-12-03 NOTE — ED Triage Notes (Signed)
Patient is from home he got up to use the bathroom lost him balance and fell. Patient is on blood thinner. C collar on. Wife is concerned patient is AMS or has dementia. There is no official diagnoses. 172/80-60-97% RA CBG: 197

## 2022-12-03 NOTE — Discharge Instructions (Signed)
We recommend that you double your lasix for the next 2-3 days, then resume normal dosing. Follow-up with cardiology on Tuesday as scheduled. Can also follow-up with your primary care doctor. Return here for new concerns.

## 2022-12-21 ENCOUNTER — Other Ambulatory Visit: Payer: Self-pay

## 2022-12-21 ENCOUNTER — Inpatient Hospital Stay (HOSPITAL_COMMUNITY)
Admission: EM | Admit: 2022-12-21 | Discharge: 2022-12-29 | DRG: 286 | Disposition: A | Discharge status: Home / Self Care | Payer: No Typology Code available for payment source | Attending: Internal Medicine | Admitting: Internal Medicine

## 2022-12-21 ENCOUNTER — Emergency Department (HOSPITAL_COMMUNITY): Payer: No Typology Code available for payment source

## 2022-12-21 ENCOUNTER — Encounter (HOSPITAL_COMMUNITY): Payer: Self-pay | Admitting: Emergency Medicine

## 2022-12-21 DIAGNOSIS — R0603 Acute respiratory distress: Secondary | ICD-10-CM | POA: Diagnosis not present

## 2022-12-21 DIAGNOSIS — Z7982 Long term (current) use of aspirin: Secondary | ICD-10-CM

## 2022-12-21 DIAGNOSIS — Z7902 Long term (current) use of antithrombotics/antiplatelets: Secondary | ICD-10-CM

## 2022-12-21 DIAGNOSIS — D631 Anemia in chronic kidney disease: Secondary | ICD-10-CM | POA: Diagnosis present

## 2022-12-21 DIAGNOSIS — N184 Chronic kidney disease, stage 4 (severe): Secondary | ICD-10-CM | POA: Diagnosis present

## 2022-12-21 DIAGNOSIS — Z8701 Personal history of pneumonia (recurrent): Secondary | ICD-10-CM

## 2022-12-21 DIAGNOSIS — K760 Fatty (change of) liver, not elsewhere classified: Secondary | ICD-10-CM | POA: Diagnosis present

## 2022-12-21 DIAGNOSIS — Z79899 Other long term (current) drug therapy: Secondary | ICD-10-CM

## 2022-12-21 DIAGNOSIS — N261 Atrophy of kidney (terminal): Secondary | ICD-10-CM | POA: Diagnosis present

## 2022-12-21 DIAGNOSIS — I452 Bifascicular block: Secondary | ICD-10-CM | POA: Diagnosis present

## 2022-12-21 DIAGNOSIS — Z8711 Personal history of peptic ulcer disease: Secondary | ICD-10-CM

## 2022-12-21 DIAGNOSIS — Z9049 Acquired absence of other specified parts of digestive tract: Secondary | ICD-10-CM

## 2022-12-21 DIAGNOSIS — Z8249 Family history of ischemic heart disease and other diseases of the circulatory system: Secondary | ICD-10-CM

## 2022-12-21 DIAGNOSIS — Z1152 Encounter for screening for COVID-19: Secondary | ICD-10-CM

## 2022-12-21 DIAGNOSIS — E538 Deficiency of other specified B group vitamins: Secondary | ICD-10-CM | POA: Diagnosis present

## 2022-12-21 DIAGNOSIS — J9601 Acute respiratory failure with hypoxia: Secondary | ICD-10-CM | POA: Diagnosis present

## 2022-12-21 DIAGNOSIS — Z8719 Personal history of other diseases of the digestive system: Secondary | ICD-10-CM

## 2022-12-21 DIAGNOSIS — I25119 Atherosclerotic heart disease of native coronary artery with unspecified angina pectoris: Secondary | ICD-10-CM | POA: Diagnosis present

## 2022-12-21 DIAGNOSIS — N179 Acute kidney failure, unspecified: Secondary | ICD-10-CM | POA: Diagnosis present

## 2022-12-21 DIAGNOSIS — K219 Gastro-esophageal reflux disease without esophagitis: Secondary | ICD-10-CM | POA: Diagnosis present

## 2022-12-21 DIAGNOSIS — G2401 Drug induced subacute dyskinesia: Secondary | ICD-10-CM | POA: Diagnosis present

## 2022-12-21 DIAGNOSIS — Z66 Do not resuscitate: Secondary | ICD-10-CM | POA: Diagnosis present

## 2022-12-21 DIAGNOSIS — I10 Essential (primary) hypertension: Secondary | ICD-10-CM | POA: Diagnosis present

## 2022-12-21 DIAGNOSIS — Z886 Allergy status to analgesic agent status: Secondary | ICD-10-CM

## 2022-12-21 DIAGNOSIS — I130 Hypertensive heart and chronic kidney disease with heart failure and stage 1 through stage 4 chronic kidney disease, or unspecified chronic kidney disease: Secondary | ICD-10-CM | POA: Diagnosis not present

## 2022-12-21 DIAGNOSIS — J810 Acute pulmonary edema: Secondary | ICD-10-CM

## 2022-12-21 DIAGNOSIS — Z808 Family history of malignant neoplasm of other organs or systems: Secondary | ICD-10-CM

## 2022-12-21 DIAGNOSIS — I5023 Acute on chronic systolic (congestive) heart failure: Secondary | ICD-10-CM | POA: Diagnosis present

## 2022-12-21 DIAGNOSIS — N400 Enlarged prostate without lower urinary tract symptoms: Secondary | ICD-10-CM | POA: Diagnosis present

## 2022-12-21 DIAGNOSIS — F4310 Post-traumatic stress disorder, unspecified: Secondary | ICD-10-CM | POA: Diagnosis present

## 2022-12-21 DIAGNOSIS — Z888 Allergy status to other drugs, medicaments and biological substances status: Secondary | ICD-10-CM

## 2022-12-21 DIAGNOSIS — E785 Hyperlipidemia, unspecified: Secondary | ICD-10-CM | POA: Diagnosis present

## 2022-12-21 DIAGNOSIS — I6522 Occlusion and stenosis of left carotid artery: Secondary | ICD-10-CM | POA: Diagnosis present

## 2022-12-21 DIAGNOSIS — E872 Acidosis, unspecified: Secondary | ICD-10-CM | POA: Diagnosis present

## 2022-12-21 DIAGNOSIS — Z825 Family history of asthma and other chronic lower respiratory diseases: Secondary | ICD-10-CM

## 2022-12-21 DIAGNOSIS — E1122 Type 2 diabetes mellitus with diabetic chronic kidney disease: Secondary | ICD-10-CM | POA: Diagnosis present

## 2022-12-21 DIAGNOSIS — J449 Chronic obstructive pulmonary disease, unspecified: Secondary | ICD-10-CM | POA: Diagnosis present

## 2022-12-21 DIAGNOSIS — I714 Abdominal aortic aneurysm, without rupture, unspecified: Secondary | ICD-10-CM | POA: Diagnosis present

## 2022-12-21 DIAGNOSIS — Z9181 History of falling: Secondary | ICD-10-CM

## 2022-12-21 DIAGNOSIS — I2489 Other forms of acute ischemic heart disease: Secondary | ICD-10-CM | POA: Diagnosis present

## 2022-12-21 DIAGNOSIS — I160 Hypertensive urgency: Secondary | ICD-10-CM | POA: Diagnosis present

## 2022-12-21 DIAGNOSIS — R001 Bradycardia, unspecified: Secondary | ICD-10-CM | POA: Diagnosis not present

## 2022-12-21 DIAGNOSIS — Z85828 Personal history of other malignant neoplasm of skin: Secondary | ICD-10-CM

## 2022-12-21 DIAGNOSIS — Z85820 Personal history of malignant melanoma of skin: Secondary | ICD-10-CM

## 2022-12-21 DIAGNOSIS — E119 Type 2 diabetes mellitus without complications: Secondary | ICD-10-CM

## 2022-12-21 DIAGNOSIS — Z87891 Personal history of nicotine dependence: Secondary | ICD-10-CM

## 2022-12-21 DIAGNOSIS — E1169 Type 2 diabetes mellitus with other specified complication: Secondary | ICD-10-CM | POA: Insufficient documentation

## 2022-12-21 DIAGNOSIS — Z8673 Personal history of transient ischemic attack (TIA), and cerebral infarction without residual deficits: Secondary | ICD-10-CM

## 2022-12-21 DIAGNOSIS — I2510 Atherosclerotic heart disease of native coronary artery without angina pectoris: Secondary | ICD-10-CM | POA: Diagnosis present

## 2022-12-21 DIAGNOSIS — E861 Hypovolemia: Secondary | ICD-10-CM | POA: Diagnosis not present

## 2022-12-21 DIAGNOSIS — F0390 Unspecified dementia without behavioral disturbance: Secondary | ICD-10-CM | POA: Diagnosis present

## 2022-12-21 DIAGNOSIS — Z95820 Peripheral vascular angioplasty status with implants and grafts: Secondary | ICD-10-CM

## 2022-12-21 DIAGNOSIS — Z951 Presence of aortocoronary bypass graft: Secondary | ICD-10-CM

## 2022-12-21 MED ORDER — FUROSEMIDE 10 MG/ML IJ SOLN
40.0000 mg | Freq: Once | INTRAMUSCULAR | Status: AC
  Administered 2022-12-21: 40 mg via INTRAVENOUS
  Filled 2022-12-21: qty 4

## 2022-12-21 NOTE — ED Triage Notes (Signed)
Pt BIB EMS from home with c/o SHOB. Pt was 47% on RA, placed on cpap, O2 increased 95%. Has hx of CHF and dementia.  2 nitro SL

## 2022-12-21 NOTE — ED Provider Notes (Signed)
[facility] Provider Note   CSN: [id number] Arrival date & time: 12/21/22  2344     History {Add pertinent medical, surgical, social history, OB history to HPI:1} Chief Complaint  Patient presents with   Respiratory Distress    [name] is a 77 y.o. male.  HPI     Home Medications Prior to Admission medications   Medication Sig Start Date End Date Taking? Authorizing Provider  atorvastatin (LIPITOR) 40 MG tablet Take 20 mg by mouth at bedtime.    [provider]  cloNIDine (CATAPRES) 0.1 MG tablet Take 1 tablet (0.1 mg total) by mouth every 8 (eight) hours. Hold of hr < 60/min or sbp <120 Patient taking differently: Take 0.1 mg by mouth See admin instructions. Take 0.1 mg by mouth every eight hours and hold if hr < 60/min or sbp <120 11/20/21   [name], MD  clopidogrel (PLAVIX) 75 MG tablet Take 1 tablet (75 mg total) by mouth daily. 06/28/13   [name], MD  cyanocobalamin (,VITAMIN B-12,) 1000 MCG/ML injection Inject 1,000 mcg into the skin every 30 (thirty) days. 05/15/13   [provider]  doxylamine, Sleep, (UNISOM) 25 MG tablet Take 25 mg by mouth at bedtime as needed for sleep.    [provider]  ferrous gluconate (FERGON) 324 MG tablet Take 324 mg by mouth every Monday, Wednesday, and Friday.    [provider]  finasteride (PROSCAR) 5 MG tablet Take 5 mg by mouth daily.  03/13/13   [provider]  FLUoxetine (PROZAC) 20 MG capsule Take 40 mg by mouth daily. 08/24/15   [provider]  FLUoxetine (PROZAC) 40 MG capsule Take 40 mg by mouth daily. 10/15/22   [provider]  furosemide (LASIX) 20 MG tablet Take 1 tablet (20 mg total) by mouth daily. 09/28/22 11/27/22  [name], MD  gabapentin (NEURONTIN) 300 MG capsule Take 300 mg by mouth in the morning and at bedtime.    [provider]  hydrocerin (EUCERIN) CREA Apply 1 application  topically 3 (three) times daily as needed (dry legs/dry skin).    [provider]  lactobacilus acidophilus & bulgar (FLORANEX) TABS chewable tablet Take 1 tablet by mouth daily. 10/15/22   [provider]  lisinopril (ZESTRIL) 20 MG tablet Take 20 mg by mouth daily.    [provider]  loperamide (IMODIUM A-D) 2 MG tablet Take 2 mg by mouth 4 (four) times daily as needed for diarrhea or loose stools.    [provider]  mirtazapine (REMERON) 15 MG tablet Take 45 mg by mouth at bedtime.    [provider]  mirtazapine (REMERON) 45 MG tablet Take 45 mg by mouth at bedtime. 10/15/22   [provider]  pantoprazole (PROTONIX) 40 MG tablet Take 1 tablet (40 mg total) by mouth daily before breakfast. 09/28/22   [name], MD  REGULOID 51.7 % POWD Take by mouth. 10/15/22   [provider]  rivastigmine (EXELON) 9.5 mg/24hr APPLY 1 PATCH EXTERNALLY TO THE SKIN EVERY DAY AS DIRECTED Patient taking differently: Place 9.5 mg onto the skin daily. 10/27/15   Ward Givens, NP  metoprolol (LOPRESSOR) 50 MG tablet Take 0.5 tablets (25 mg total) by mouth 2 (two) times daily. 03/05/11 12/09/11  [name], MD      Allergies    Aspirin, Donepezil, Galantamine, Memantine, Omeprazole, and Terazosin    Review of Systems   Review of Systems  Physical Exam Updated Vital Signs Ht 5\' 9"  (1.753 m)   Wt 69 kg   BMI 22.46 kg/m  Physical Exam  ED Results / Procedures / Treatments   Labs (all labs ordered are listed, but only abnormal results are displayed) Labs Reviewed - No data to display  EKG None  Radiology No results found.  Procedures Procedures  {Document cardiac monitor, telemetry assessment procedure when appropriate:1}  Medications Ordered in ED Medications  furosemide (LASIX) injection 40 mg (has no administration in time range)    ED Course/ Medical Decision Making/ A&P   {   Click here for ABCD2, HEART and other  calculatorsREFRESH Note before signing :1}                          Medical Decision Making Amount and/or Complexity of Data Reviewed Radiology: ordered.  Risk Prescription drug management.   ***  {Document critical care time when appropriate:1} {Document review of labs and clinical decision tools ie heart score, Chads2Vasc2 etc:1}  {Document your independent review of radiology images, and any outside records:1} {Document your discussion with family members, caretakers, and with consultants:1} {Document social determinants of health affecting pt's care:1} {Document your decision making why or why not admission, treatments were needed:1} Final Clinical Impression(s) / ED Diagnoses Final diagnoses:  None    Rx / DC Orders ED Discharge Orders     None

## 2022-12-22 ENCOUNTER — Other Ambulatory Visit (HOSPITAL_COMMUNITY): Payer: No Typology Code available for payment source

## 2022-12-22 ENCOUNTER — Encounter (HOSPITAL_COMMUNITY): Payer: Self-pay | Admitting: Family Medicine

## 2022-12-22 DIAGNOSIS — Z1152 Encounter for screening for COVID-19: Secondary | ICD-10-CM | POA: Diagnosis not present

## 2022-12-22 DIAGNOSIS — D631 Anemia in chronic kidney disease: Secondary | ICD-10-CM | POA: Diagnosis present

## 2022-12-22 DIAGNOSIS — J449 Chronic obstructive pulmonary disease, unspecified: Secondary | ICD-10-CM | POA: Diagnosis present

## 2022-12-22 DIAGNOSIS — Z8673 Personal history of transient ischemic attack (TIA), and cerebral infarction without residual deficits: Secondary | ICD-10-CM

## 2022-12-22 DIAGNOSIS — E785 Hyperlipidemia, unspecified: Secondary | ICD-10-CM | POA: Diagnosis present

## 2022-12-22 DIAGNOSIS — I2510 Atherosclerotic heart disease of native coronary artery without angina pectoris: Secondary | ICD-10-CM

## 2022-12-22 DIAGNOSIS — J9601 Acute respiratory failure with hypoxia: Secondary | ICD-10-CM | POA: Insufficient documentation

## 2022-12-22 DIAGNOSIS — E1169 Type 2 diabetes mellitus with other specified complication: Secondary | ICD-10-CM | POA: Diagnosis present

## 2022-12-22 DIAGNOSIS — I130 Hypertensive heart and chronic kidney disease with heart failure and stage 1 through stage 4 chronic kidney disease, or unspecified chronic kidney disease: Secondary | ICD-10-CM | POA: Diagnosis present

## 2022-12-22 DIAGNOSIS — Z66 Do not resuscitate: Secondary | ICD-10-CM | POA: Diagnosis present

## 2022-12-22 DIAGNOSIS — E1122 Type 2 diabetes mellitus with diabetic chronic kidney disease: Secondary | ICD-10-CM | POA: Diagnosis present

## 2022-12-22 DIAGNOSIS — N261 Atrophy of kidney (terminal): Secondary | ICD-10-CM | POA: Diagnosis not present

## 2022-12-22 DIAGNOSIS — I10 Essential (primary) hypertension: Secondary | ICD-10-CM

## 2022-12-22 DIAGNOSIS — I6522 Occlusion and stenosis of left carotid artery: Secondary | ICD-10-CM | POA: Diagnosis present

## 2022-12-22 DIAGNOSIS — N400 Enlarged prostate without lower urinary tract symptoms: Secondary | ICD-10-CM | POA: Diagnosis present

## 2022-12-22 DIAGNOSIS — Z95820 Peripheral vascular angioplasty status with implants and grafts: Secondary | ICD-10-CM | POA: Diagnosis not present

## 2022-12-22 DIAGNOSIS — F0390 Unspecified dementia without behavioral disturbance: Secondary | ICD-10-CM | POA: Diagnosis present

## 2022-12-22 DIAGNOSIS — R0603 Acute respiratory distress: Secondary | ICD-10-CM | POA: Diagnosis present

## 2022-12-22 DIAGNOSIS — I160 Hypertensive urgency: Secondary | ICD-10-CM | POA: Diagnosis present

## 2022-12-22 DIAGNOSIS — I452 Bifascicular block: Secondary | ICD-10-CM | POA: Diagnosis present

## 2022-12-22 DIAGNOSIS — E872 Acidosis, unspecified: Secondary | ICD-10-CM | POA: Diagnosis present

## 2022-12-22 DIAGNOSIS — E861 Hypovolemia: Secondary | ICD-10-CM | POA: Diagnosis not present

## 2022-12-22 DIAGNOSIS — N184 Chronic kidney disease, stage 4 (severe): Secondary | ICD-10-CM | POA: Diagnosis present

## 2022-12-22 DIAGNOSIS — I5023 Acute on chronic systolic (congestive) heart failure: Secondary | ICD-10-CM | POA: Diagnosis present

## 2022-12-22 DIAGNOSIS — K760 Fatty (change of) liver, not elsewhere classified: Secondary | ICD-10-CM | POA: Diagnosis present

## 2022-12-22 DIAGNOSIS — N179 Acute kidney failure, unspecified: Secondary | ICD-10-CM | POA: Diagnosis present

## 2022-12-22 DIAGNOSIS — R7989 Other specified abnormal findings of blood chemistry: Secondary | ICD-10-CM

## 2022-12-22 DIAGNOSIS — K219 Gastro-esophageal reflux disease without esophagitis: Secondary | ICD-10-CM | POA: Diagnosis present

## 2022-12-22 DIAGNOSIS — R0609 Other forms of dyspnea: Secondary | ICD-10-CM | POA: Diagnosis not present

## 2022-12-22 DIAGNOSIS — I2489 Other forms of acute ischemic heart disease: Secondary | ICD-10-CM | POA: Diagnosis present

## 2022-12-22 DIAGNOSIS — Z79899 Other long term (current) drug therapy: Secondary | ICD-10-CM | POA: Diagnosis not present

## 2022-12-22 LAB — BRAIN NATRIURETIC PEPTIDE: B Natriuretic Peptide: 748.9 pg/mL — ABNORMAL HIGH (ref 0.0–100.0)

## 2022-12-22 LAB — COMPREHENSIVE METABOLIC PANEL
Albumin: 3.4 g/dL — ABNORMAL LOW (ref 3.5–5.0)
Anion gap: 14 (ref 5–15)
BUN: 40 mg/dL — ABNORMAL HIGH (ref 8–23)
CO2: 15 mmol/L — ABNORMAL LOW (ref 22–32)
Calcium: 8.2 mg/dL — ABNORMAL LOW (ref 8.9–10.3)
Chloride: 108 mmol/L (ref 98–111)
Creatinine, Ser: 2.36 mg/dL — ABNORMAL HIGH (ref 0.61–1.24)
GFR, Estimated: 27 mL/min — ABNORMAL LOW (ref >60)
Glucose, Bld: 208 mg/dL — ABNORMAL HIGH (ref 70–99)
Sodium: 137 mmol/L (ref 135–145)

## 2022-12-22 LAB — CBC WITH DIFFERENTIAL/PLATELET
Abs Immature Granulocytes: 0.12 10*3/uL — ABNORMAL HIGH (ref 0.00–0.07)
Basophils Absolute: 0.1 10*3/uL (ref 0.0–0.1)
Basophils Relative: 0 %
Eosinophils Absolute: 0.3 10*3/uL (ref 0.0–0.5)
Eosinophils Relative: 2 %
HCT: 33.8 % — ABNORMAL LOW (ref 39.0–52.0)
Hemoglobin: 10.9 g/dL — ABNORMAL LOW (ref 13.0–17.0)
Immature Granulocytes: 1 %
Lymphocytes Relative: 11 %
Lymphs Abs: 1.6 10*3/uL (ref 0.7–4.0)
MCH: 31.9 pg (ref 26.0–34.0)
MCHC: 32.2 g/dL (ref 30.0–36.0)
MCV: 98.8 fL (ref 80.0–100.0)
Monocytes Absolute: 0.7 10*3/uL (ref 0.1–1.0)
Monocytes Relative: 5 %
Neutro Abs: 11.9 10*3/uL — ABNORMAL HIGH (ref 1.7–7.7)
Neutrophils Relative %: 81 %
Platelets: 211 10*3/uL (ref 150–400)
RBC: 3.42 MIL/uL — ABNORMAL LOW (ref 4.22–5.81)
RDW: 14.5 % (ref 11.5–15.5)
WBC: 14.7 10*3/uL — ABNORMAL HIGH (ref 4.0–10.5)
nRBC: 0 % (ref 0.0–0.2)

## 2022-12-22 LAB — TROPONIN I (HIGH SENSITIVITY)
Troponin I (High Sensitivity): 87 ng/L — ABNORMAL HIGH (ref <18)
Troponin I (High Sensitivity): 474 ng/L (ref <18)

## 2022-12-22 LAB — RESP PANEL BY RT-PCR (RSV, FLU A&B, COVID)  RVPGX2
Influenza A by PCR: NEGATIVE
Influenza B by PCR: NEGATIVE
Resp Syncytial Virus by PCR: NEGATIVE
SARS Coronavirus 2 by RT PCR: NEGATIVE

## 2022-12-22 LAB — BASIC METABOLIC PANEL
Anion gap: 11 (ref 5–15)
BUN: 42 mg/dL — ABNORMAL HIGH (ref 8–23)
CO2: 22 mmol/L (ref 22–32)
Calcium: 8.7 mg/dL — ABNORMAL LOW (ref 8.9–10.3)
Chloride: 107 mmol/L (ref 98–111)
Creatinine, Ser: 2.39 mg/dL — ABNORMAL HIGH (ref 0.61–1.24)
GFR, Estimated: 27 mL/min — ABNORMAL LOW (ref >60)
Glucose, Bld: 126 mg/dL — ABNORMAL HIGH (ref 70–99)
Potassium: 4.9 mmol/L (ref 3.5–5.1)
Sodium: 140 mmol/L (ref 135–145)

## 2022-12-22 LAB — CBC
HCT: 33 % — ABNORMAL LOW (ref 39.0–52.0)
Hemoglobin: 10.8 g/dL — ABNORMAL LOW (ref 13.0–17.0)
MCH: 31.1 pg (ref 26.0–34.0)
MCHC: 32.7 g/dL (ref 30.0–36.0)
MCV: 95.1 fL (ref 80.0–100.0)
Platelets: 179 10*3/uL (ref 150–400)
RBC: 3.47 MIL/uL — ABNORMAL LOW (ref 4.22–5.81)
RDW: 14.1 % (ref 11.5–15.5)
WBC: 10.8 10*3/uL — ABNORMAL HIGH (ref 4.0–10.5)
nRBC: 0 % (ref 0.0–0.2)

## 2022-12-22 LAB — LACTIC ACID, PLASMA
Lactic Acid, Venous: 3.2 mmol/L (ref 0.5–1.9)
Lactic Acid, Venous: 1 mmol/L (ref 0.5–1.9)

## 2022-12-22 LAB — GLUCOSE, CAPILLARY
Glucose-Capillary: 107 mg/dL — ABNORMAL HIGH (ref 70–99)
Glucose-Capillary: 99 mg/dL (ref 70–99)
Glucose-Capillary: 123 mg/dL — ABNORMAL HIGH (ref 70–99)
Glucose-Capillary: 68 mg/dL — ABNORMAL LOW (ref 70–99)
Glucose-Capillary: 77 mg/dL (ref 70–99)
Glucose-Capillary: 149 mg/dL — ABNORMAL HIGH (ref 70–99)

## 2022-12-22 LAB — MAGNESIUM: Magnesium: 2 mg/dL (ref 1.7–2.4)

## 2022-12-22 MED ORDER — HEPARIN SODIUM (PORCINE) 5000 UNIT/ML IJ SOLN
5000.0000 [IU] | Freq: Three times a day (TID) | INTRAMUSCULAR | Status: DC
  Administered 2022-12-22 – 2022-12-24 (×8): 5000 [IU] via SUBCUTANEOUS
  Filled 2022-12-22 (×8): qty 1

## 2022-12-22 MED ORDER — MIRTAZAPINE 15 MG PO TABS
45.0000 mg | ORAL_TABLET | Freq: Every day | ORAL | Status: DC
  Administered 2022-12-22 – 2022-12-28 (×7): 45 mg via ORAL
  Filled 2022-12-22 (×7): qty 3

## 2022-12-22 MED ORDER — RIVASTIGMINE 9.5 MG/24HR TD PT24
9.5000 mg | MEDICATED_PATCH | Freq: Every day | TRANSDERMAL | Status: DC
  Administered 2022-12-22 – 2022-12-29 (×8): 9.5 mg via TRANSDERMAL
  Filled 2022-12-22 (×8): qty 1

## 2022-12-22 MED ORDER — FLUOXETINE HCL 20 MG PO CAPS
40.0000 mg | ORAL_CAPSULE | Freq: Every day | ORAL | Status: DC
  Administered 2022-12-22 – 2022-12-29 (×8): 40 mg via ORAL
  Filled 2022-12-22 (×8): qty 2

## 2022-12-22 MED ORDER — SODIUM CHLORIDE 0.9 % IV SOLN: 100.0000 mg | Freq: Once | INTRAVENOUS | Status: DC

## 2022-12-22 MED ORDER — FINASTERIDE 5 MG PO TABS
5.0000 mg | ORAL_TABLET | Freq: Every day | ORAL | Status: DC
  Administered 2022-12-22 – 2022-12-29 (×8): 5 mg via ORAL
  Filled 2022-12-22 (×8): qty 1

## 2022-12-22 MED ORDER — ACETAMINOPHEN 650 MG RE SUPP: 650.0000 mg | Freq: Four times a day (QID) | RECTAL | Status: DC | PRN

## 2022-12-22 MED ORDER — CLOPIDOGREL BISULFATE 75 MG PO TABS
75.0000 mg | ORAL_TABLET | Freq: Every day | ORAL | Status: DC
  Administered 2022-12-22 – 2022-12-29 (×8): 75 mg via ORAL
  Filled 2022-12-22 (×8): qty 1

## 2022-12-22 MED ORDER — DOXYCYCLINE HYCLATE 100 MG PO TABS
100.0000 mg | ORAL_TABLET | Freq: Once | ORAL | Status: AC
  Administered 2022-12-22: 100 mg via ORAL
  Filled 2022-12-22: qty 1

## 2022-12-22 MED ORDER — CARVEDILOL 6.25 MG PO TABS
6.2500 mg | ORAL_TABLET | Freq: Two times a day (BID) | ORAL | Status: DC
  Administered 2022-12-22 – 2022-12-24 (×4): 6.25 mg via ORAL
  Filled 2022-12-22 (×4): qty 1

## 2022-12-22 MED ORDER — ACETAMINOPHEN 325 MG PO TABS
650.0000 mg | ORAL_TABLET | Freq: Four times a day (QID) | ORAL | Status: DC | PRN
  Administered 2022-12-22: 650 mg via ORAL
  Filled 2022-12-22: qty 2

## 2022-12-22 MED ORDER — LISINOPRIL 20 MG PO TABS
20.0000 mg | ORAL_TABLET | Freq: Every day | ORAL | Status: DC
  Administered 2022-12-22: 20 mg via ORAL
  Filled 2022-12-22: qty 1

## 2022-12-22 MED ORDER — PANTOPRAZOLE SODIUM 40 MG PO TBEC
40.0000 mg | DELAYED_RELEASE_TABLET | Freq: Every day | ORAL | Status: DC
  Administered 2022-12-22 – 2022-12-29 (×8): 40 mg via ORAL
  Filled 2022-12-22 (×8): qty 1

## 2022-12-22 MED ORDER — FAMOTIDINE 20 MG PO TABS
20.0000 mg | ORAL_TABLET | ORAL | Status: DC
  Administered 2022-12-22 – 2022-12-28 (×4): 20 mg via ORAL
  Filled 2022-12-22 (×6): qty 1

## 2022-12-22 MED ORDER — INSULIN ASPART 100 UNIT/ML IJ SOLN
0.0000 [IU] | INTRAMUSCULAR | Status: DC
  Administered 2022-12-24: 2 [IU] via SUBCUTANEOUS

## 2022-12-22 MED ORDER — SODIUM CHLORIDE 0.9% FLUSH
3.0000 mL | Freq: Two times a day (BID) | INTRAVENOUS | Status: DC
  Administered 2022-12-22 – 2022-12-26 (×7): 3 mL via INTRAVENOUS

## 2022-12-22 MED ORDER — ATORVASTATIN CALCIUM 10 MG PO TABS
20.0000 mg | ORAL_TABLET | Freq: Every day | ORAL | Status: DC
  Administered 2022-12-22 – 2022-12-28 (×7): 20 mg via ORAL
  Filled 2022-12-22 (×7): qty 2

## 2022-12-22 MED ORDER — FUROSEMIDE 10 MG/ML IJ SOLN
40.0000 mg | Freq: Two times a day (BID) | INTRAMUSCULAR | Status: DC
  Administered 2022-12-22 – 2022-12-23 (×3): 40 mg via INTRAVENOUS
  Filled 2022-12-22 (×3): qty 4

## 2022-12-22 MED ORDER — SODIUM CHLORIDE 0.9 % IV SOLN
1.0000 g | Freq: Once | INTRAVENOUS | Status: DC
  Filled 2022-12-22: qty 10

## 2022-12-22 MED ORDER — LOSARTAN POTASSIUM 25 MG PO TABS
25.0000 mg | ORAL_TABLET | Freq: Every day | ORAL | Status: DC
  Administered 2022-12-23: 25 mg via ORAL
  Filled 2022-12-22 (×2): qty 1

## 2022-12-22 MED ORDER — GABAPENTIN 300 MG PO CAPS
300.0000 mg | ORAL_CAPSULE | Freq: Two times a day (BID) | ORAL | Status: DC
  Administered 2022-12-22 – 2022-12-29 (×15): 300 mg via ORAL
  Filled 2022-12-22 (×15): qty 1

## 2022-12-22 MED ORDER — SODIUM CHLORIDE 0.9 % IV SOLN
1.0000 g | Freq: Once | INTRAVENOUS | Status: AC
  Administered 2022-12-22: 1 g via INTRAVENOUS

## 2022-12-22 MED ORDER — ASPIRIN 81 MG PO CHEW
81.0000 mg | CHEWABLE_TABLET | Freq: Every day | ORAL | Status: DC
  Administered 2022-12-22 – 2022-12-29 (×8): 81 mg via ORAL
  Filled 2022-12-22 (×8): qty 1

## 2022-12-22 MED ORDER — FUROSEMIDE 10 MG/ML IJ SOLN: 40.0000 mg | Freq: Two times a day (BID) | INTRAMUSCULAR | Status: DC

## 2022-12-22 NOTE — Assessment & Plan Note (Addendum)
Sp CABG, with ischemic heart disease.  Patient with chest pain at home along with dyspnea. No active chest pain.  Echocardiogram with new wall motion abnormalities anterior wall akinetic and hypokinetic.   Plan to continue medical therapy with aspirin, clopidogrel and atorvastatin. Follow up with inpatient nuclear stress test.

## 2022-12-22 NOTE — Progress Notes (Signed)
Progress Note   Patient: [name] [id number] DOB: 02/17/1946 DOA: 12/21/2022     0 DOS: the patient was seen and examined on 12/22/2022   Brief hospital course: Mr. Steffensmeier was admitted to the hospital with the working diagnosis of heart failure exacerbation.   77 yo male with the past medical history of hypertension, T2DM, coronary artery disease, hx of CVA, BPH and carotid stenosis with presented with dyspnea. Reported rapid worsening of dyspnea. Last night he had PND and was note able to cath his breath. EMS was called and he was found hypertensive with systolic in the [id number] and hypoxemic with 02 saturation 47% on room air. He received nitroglycerin SL, placed on Cpap and transported to the ED. In the emergency room he was placed on Bipap, for respiratory distress, blood pressure 132/74, HR 68, RR 19 and 02 saturation 100%, lungs with rales but no wheezing, heart with S1 and S2 present and rhythmic, abdomen with no distention, and no lower extremity edema.   Na 135, K 5.1 CL 105 bicarbonate 23, glucose 125 bun 33 cr 2,24  BNP 719 High sensitive troponin 20, 87 and 474  Lactic acid 3,2  Wbc 14.7 hgb 10,9 plt 211  Sars covid 19 negative   Chest radiograph with mild cardiomegaly with bilateral hilar vascular congestion and cephalization of the vasculature.  Sternotomy wires in place.   EKG 92 bpm, left axis deviation, interventricular conduction delay, not  qtc prolongation, sinus rhythm with q wave V1 and V2 with no significant ST segment or T wave changes.    Assessment and Plan: * Acute on chronic systolic CHF (congestive heart failure) ([facility]) 09/2022 echocardiogram with reduced LV systolic function 30 to [id number], global hypokinesis. RV systolic function preserved. No significant valvular disease.   No documented urine output.  Systolic blood pressure is 151 to 103 mmHg.  Plan to continue diuresis with IV furosemide  Heart failure management with carvedilol, change lisinopril  to ARB. Holding on spironolactone and SGLT 2 inh due to low GFR.  CAD (coronary artery disease) Sp CABG, with ischemic heart disease.  Patient with chest pain at home along with dyspnea. No clear anginal symptoms.   Plan to continue medical therapy with aspirin, clopidogrel and atorvastatin. Check limited echocardiogram to assess wall motion abnormalities.  Elevated troponin possible due to demand ischemia and not acute coronary syndrome.   Essential hypertension Continue blood pressure control with carvedilol, will transition to ARB from ace inh and will add spironolactone.   CKD (chronic kidney disease) stage 4, GFR 15-29 ml/min (HCC) Renal function with serum cr at 2,39 with K at 4,9 and serum bicarbonate 22.  Plan to continue diuresis and follow up renal function in am.  Type 2 diabetes mellitus (HCC) Fasting glucose is 126 mg/dl. Plan to continue glucose cover and monitoring.   History of CVA (cerebrovascular accident) Continue blood pressure control, statin and dual antiplatelet therapy.         Subjective: Patient is feeling better, dyspnea has improved and Bipap has been removed,. No chest pain, no nausea or abdominal pain. Patient fell at home few weeks ago.   Physical Exam: Vitals:   12/22/22 0500 12/22/22 0743 12/22/22 0850 12/22/22 1113  BP:  (!) 151/69  (!) 103/58  Pulse:  74  (!) 54  Resp:  11 11 13   Temp:  97.8 F (36.6 C)  98.8 F (37.1 C)  TempSrc:  Oral  Oral  SpO2:  96%  96%  Weight: 77.2  kg     Height:       Neurology awake and alert ENT with mild pallor Cardiovascular with S1 and S2 present and rhythmic with no gallops, rubs or murmurs No JVD No lower extremity edema Respiratory with distant breath sounds and prolonged expiratory phase with scattered rales with no wheezing Abdomen with no distention  Data Reviewed:    Family Communication: no family at the bedside   Disposition: Status is: Inpatient Remains inpatient appropriate  because: heart failure   Planned Discharge Destination: Home    Author: Tawni Millers, MD 12/22/2022 12:51 PM  For on call review [email or web address].

## 2022-12-22 NOTE — Plan of Care (Signed)
  Problem: Cardiac: Goal: Ability to achieve and maintain adequate cardiopulmonary perfusion will improve Outcome: Progressing   Problem: Fluid Volume: Goal: Ability to maintain a balanced intake and output will improve Outcome: Progressing   Problem: Pain Managment: Goal: General experience of comfort will improve Outcome: Progressing   Problem: Safety: Goal: Ability to remain free from injury will improve Outcome: Progressing

## 2022-12-22 NOTE — Hospital Course (Addendum)
[name] was admitted to the hospital with the working diagnosis of heart failure exacerbation.   77 yo male with the past medical history of hypertension, T2DM, coronary artery disease, hx of CVA, BPH and carotid stenosis with presented with dyspnea. Reported rapid worsening of dyspnea. Last night he had PND and was note able to cath his breath. EMS was called and he was found hypertensive with systolic in the 123456 and hypoxemic with 02 saturation 47% on room air. He received nitroglycerin SL, placed on Cpap and transported to the ED. In the emergency room he was placed on Bipap, for respiratory distress, blood pressure 132/74, HR 68, RR 19 and 02 saturation 100%, lungs with rales but no wheezing, heart with S1 and S2 present and rhythmic, abdomen with no distention, and no lower extremity edema.   Na 135, K 5.1 CL 105 bicarbonate 23, glucose 125 bun 33 cr 2,24  BNP 719 High sensitive troponin 20, 87 and 474  Lactic acid 3,2  Wbc 14.7 hgb 10,9 plt 211  Sars covid 19 negative   Chest radiograph with mild cardiomegaly with bilateral hilar vascular congestion and cephalization of the vasculature.  Sternotomy wires in place.   EKG 92 bpm, left axis deviation, interventricular conduction delay, not  qtc prolongation, sinus rhythm with q wave V1 and V2 with no significant ST segment or T wave changes.   03/22 echocardiogram with wall motion abnormalities.  Volume status has improved.  Consulted cardiology.  03/23 cardiac catheterization with no elevation in filling pressures.  Plan for cardiac nuclear stress test inpatient.  03/24 clinically stable, pending stress test possible tomorrow.  03/25 stress test and renal artery doppler US part of the workup.  03/26 stress test with intermediate risk.

## 2022-12-22 NOTE — TOC Initial Note (Addendum)
Transition of Care [facility]) - Initial/Assessment Note    Patient Details  Name: [name] MRN: [id number] Date of Birth: Dec 19, 1945  Transition of [facility]) CM/SW Contact:    Zenon Mayo, RN Phone Number: 12/22/2022, 4:42 PM  Clinical Narrative:                 NCM spoke with patient at the bedside, daughter was there, she states wife does not want any HH services coming into the house, he is already set up with the [facility] for Neuro Physical therapy and the wife will be driving him there. He just finished his last 20 th day at [facility] and so if he goes back to a SNF he will be in copay days.  So they will cont to do the Neuro PT at the [place] .  He has a rollator at home.  Wife will transport him home at dc.  NCM notified Malachy Mood with [facility] of this information as well.  Expected Discharge Plan: Home/Self Care (neuro pt at [facility]) Barriers to Discharge: Continued Medical Work up   Patient Goals and CMS Choice Patient states their goals for this hospitalization and ongoing recovery are:: return home with wife   Choice offered to / list presented to : NA      Expected Discharge Plan and Services   Discharge Planning Services: CM Consult Post Acute Care Choice: NA Living arrangements for the past 2 months: Single Family Home                 DME Arranged: N/A DME Agency: NA       HH Arranged: NA          Prior Living Arrangements/Services Living arrangements for the past 2 months: Single Family Home Lives with:: Spouse Patient language and need for interpreter reviewed:: Yes Do you feel safe going back to the place where you live?: Yes      Need for Family Participation in Patient Care: Yes (Comment) Care giver support system in place?: Yes (comment) Current home services: DME (exercise bike, rollator) Criminal Activity/Legal Involvement Pertinent to Current Situation/Hospitalization: No - Comment as needed  Activities of Daily Living Home Assistive  Devices/Equipment: Walker (specify type), Blood pressure cuff, Scales ADL Screening (condition at time of admission) Patient's cognitive ability adequate to safely complete daily activities?: Yes Is the patient deaf or have difficulty hearing?: Yes Does the patient have difficulty seeing, even when wearing glasses/contacts?: No Does the patient have difficulty concentrating, remembering, or making decisions?: No Patient able to express need for assistance with ADLs?: Yes Does the patient have difficulty dressing or bathing?: Yes Independently performs ADLs?: No Communication: Independent Dressing (OT): Needs assistance Is this a change from baseline?: Pre-admission baseline Grooming: Independent Feeding: Independent Bathing: Independent Toileting: Independent In/Out Bed: Independent Walks in Home: Independent with device (comment) Does the patient have difficulty walking or climbing stairs?: Yes Weakness of Legs: Both Weakness of Arms/Hands: Both  Permission Sought/Granted                  Emotional Assessment Appearance:: Appears stated age Attitude/Demeanor/Rapport: Engaged Affect (typically observed): Appropriate Orientation: : Oriented to Self, Oriented to Place, Oriented to  Time, Oriented to Situation Alcohol / Substance Use: Not Applicable Psych Involvement: No (comment)  Admission diagnosis:  Flash pulmonary edema (HCC) [J81.0] Acute on chronic systolic congestive heart failure (HCC) [I50.23] Acute on chronic systolic CHF (congestive heart failure) ([place]) [I50.23] Patient Active Problem List   Diagnosis Date Noted  Acute on chronic systolic CHF (congestive heart failure) ([place]) 12/22/2022   Acute respiratory failure with hypoxia (HCC) 12/22/2022   Lactic acidosis 12/22/2022   Elevated troponin 12/22/2022   AKI (acute kidney injury) ([place]) 09/23/2022   CKD (chronic kidney disease) stage 4, GFR 15-29 ml/min (HCC) 09/23/2022   Physical deconditioning 11/20/2021    Malnutrition of moderate degree 11/19/2021   Autonomic orthostatic hypotension 11/18/2021   Restless leg 11/18/2021   Chronic diastolic CHF (congestive heart failure) (HCC) 123456   Metabolic acidosis 123456   Anemia due to chronic kidney disease 11/17/2021   Elevated CPK 11/16/2021   Syncope 11/16/2021   Nausea & vomiting 11/16/2021   Leukocytosis 11/16/2021   Prolonged QT interval 11/16/2021   BPH (benign prostatic hyperplasia)    AAA (abdominal aortic aneurysm) without rupture ([place]) 07/31/2014   Diabetes mellitus ([place]) 06/06/2012   CTS (carpal tunnel syndrome) 06/02/2012   Encephalopathy acute 05/24/2012   Hemorrhoids, internal, with bleeding 02/15/2012   IBS (irritable bowel syndrome) with episodic urgent diarrhea 02/15/2012   Tardive dyskinesia 12/09/2011   History of CVA (cerebrovascular accident) 12/09/2011   Seizure disorder ([place]) 12/08/2011   CAD (coronary artery disease) 01/27/2011   Carotid stenosis    Type 2 diabetes mellitus ([place]) 05/17/2008   PTSD (post-traumatic stress disorder) 02/24/2008   FATIGUE 02/24/2008   GAIT IMBALANCE 02/24/2008   COLONIC POLYPS, HX OF 02/24/2008   HLD (hyperlipidemia) 06/22/2007   THROMBOCYTOPENIA NOS 06/22/2007   Essential hypertension 06/22/2007   ANEURYSM, ABDOMINAL AORTIC 06/22/2007   GERD 06/22/2007   FATTY LIVER DISEASE 06/22/2007   PSA, INCREASED 06/22/2007   INJURY NOS, HEAD 06/22/2007   Personal history of unspecified circulatory disease 06/22/2007   HX, URINARY INFECTION 06/22/2007   PCP:  [place], [place]:   [facility] [place], [place] [address] AT [facility] [address] [place] [address] Phone: [phone] Fax: [place], [place] [address] [place] [address] Phone: [phone] Fax: [phone]     Social Determinants of Health ([place]) Social History: [place]: No Food Insecurity (09/23/2022)  Housing: Low Risk  (09/23/2022)  Transportation Needs: No Transportation Needs (09/23/2022)  Utilities: Not At Risk (09/23/2022)  Tobacco Use: Medium Risk (12/22/2022)   SDOH Interventions:     Readmission Risk Interventions    12/22/2022    4:38 PM 09/24/2022    3:44 PM  Readmission Risk Prevention Plan  Transportation Screening Complete Complete  PCP or Specialist Appt within 5-7 Days  Complete  PCP or Specialist Appt within 3-5 Days Complete   Home Care Screening  Complete  Medication Review (RN CM)  Complete  HRI or Home Care Consult Complete   Palliative Care Screening Not Applicable   Medication Review (RN Care Manager) Complete

## 2022-12-22 NOTE — Assessment & Plan Note (Addendum)
His glucose remained stable during his hospitalization.  Continue with statin therapy.
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
## 2022-12-22 NOTE — Assessment & Plan Note (Addendum)
AKI,   A the time of his discharge his renal function has been stable for the last 48 hrs with serum cr at 2,22 with K at 4,1 and serum bicarbonate at 19. Na 139 and BUN 53   Continue blood pressure control.  As needed furosemide for diuresis.  Follow up renal function as outpatient.   Anemia of chronic renal disease with iron deficiency.  Continue oral iron supplementation.  Discharge hgb is 9.8
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
## 2022-12-22 NOTE — Assessment & Plan Note (Addendum)
Follow up echocardiogram with reduced LV systolic function with EF 35 to 40%, entire inferior wall, basal infero lateral segment, and apical septal segment are akinetic. The entire anterior wall, antero lateral wall and mid distal lateral wall, anterior septum mid inferoseptal, basal infero septal segment and apex are akinetic. No significant valvular disease.   03/22 cardiac catheterization  PA 22/11 (11). PCWP 2 Cardiac output/ index+ 4.5 and 2.4  PVR 2.0 wu   Patient received furosemide for diuresis, negative fluid balance was achieved, - 4,965, with significant improvement in his symptoms.   Started on hydralazine and isosorbide for after load reduction.  Carvedilol for B blockade.  Resume loop diuretic at the time of discharge.

## 2022-12-22 NOTE — Assessment & Plan Note (Signed)
Continue blood pressure control, statin and dual antiplatelet therapy.

## 2022-12-22 NOTE — Evaluation (Addendum)
Occupational Therapy Evaluation Patient Details Name: [name] MRN: [id number] DOB: 11-22-1945 Today's Date: 12/22/2022   History of Present Illness Pt is a 77 y/o M presenting to ED on 3/19 from home with SOB (SpO2 47% on RA) and hypertensive, admitted for acute on chronic systolic CHF. PMH includes HTN, DM2, CAD, COPD, CVA, BPH, carotid artery stenosis, Vitamin B12 deficiency, AAA.   Clinical Impression   Pt reports having assist at baseline from spouse or IADLs and transportation, ind with ADLs, uses rollator for mobility and lives with spouse who is present 24/7. Pt currently needing min guard-max A for ADLs, min A for bed mobility, and min-mod A for transfers with use of RW. Pt with posterior lean, increased time to weight shift forward to balance in standing. VSS on RA. Pt presenting with impairments listed below, will follow acutely. Recommend HHOT at d/c pending progression if spouse able to provide level of assist needed at d/c, if unable recommend OP OT as daughter/pt are agreeable.       Recommendations for follow up therapy are one component of a multi-disciplinary discharge planning process, led by the attending physician.  Recommendations may be updated based on patient status, additional functional criteria and insurance authorization.   Follow Up Recommendations  Home health OT (pending progression, if spouse/family able to provide necessary level of assist at d/c.)     Assistance Recommended at Discharge Intermittent Supervision/Assistance  Patient can return home with the following A little help with walking and/or transfers;A lot of help with bathing/dressing/bathroom;Assistance with cooking/housework;Direct supervision/assist for medications management;Direct supervision/assist for financial management;Assist for transportation;Help with stairs or ramp for entrance    Functional Status Assessment  Patient has had a recent decline in their functional status and  demonstrates the ability to make significant improvements in function in a reasonable and predictable amount of time.  Equipment Recommendations  BSC/3in1    Recommendations for Other Services PT consult     Precautions / Restrictions Precautions Precautions: Fall Restrictions Weight Bearing Restrictions: No      Mobility Bed Mobility Overal bed mobility: Needs Assistance Bed Mobility: Supine to Sit     Supine to sit: Min assist     General bed mobility comments: cues to scoot to EOB    Transfers Overall transfer level: Needs assistance Equipment used: Rolling walker (2 wheels) Transfers: Sit to/from Stand, Bed to chair/wheelchair/BSC Sit to Stand: Min assist, Mod assist     Step pivot transfers: Min assist, Mod assist     General transfer comment: mod posterior lean initially, pt needing cues to weight shift forward, able to step in place, transferred to chair on pt's R side      Balance Overall balance assessment: Needs assistance Sitting-balance support: Feet supported Sitting balance-Leahy Scale: Fair Sitting balance - Comments: reaches minimally outside BOS without LOB   Standing balance support: During functional activity Standing balance-Leahy Scale: Poor Standing balance comment: reliant on external support                           ADL either performed or assessed with clinical judgement   ADL Overall ADL's : Needs assistance/impaired Eating/Feeding: Set up;Sitting   Grooming: Min guard;Sitting   Upper Body Bathing: Minimal assistance;Sitting   Lower Body Bathing: Maximal assistance;Bed level   Upper Body Dressing : Minimal assistance;Sitting   Lower Body Dressing: Maximal assistance;Sitting/lateral leans   Toilet Transfer: Minimal assistance;Moderate assistance;Stand-pivot;Rolling walker (2 wheels);BSC/3in1 Toilet Transfer Details (indicate  cue type and reason): simulated to chair Toileting- Clothing Manipulation and Hygiene:  Minimal assistance       Functional mobility during ADLs: Minimal assistance;Moderate assistance;Rolling walker (2 wheels)       Vision   Vision Assessment?: No apparent visual deficits     Perception Perception Perception Tested?: No   Praxis Praxis Praxis tested?: Not tested    Pertinent Vitals/Pain Pain Assessment Pain Assessment: No/denies pain     Hand Dominance Left   Extremity/Trunk Assessment Upper Extremity Assessment Upper Extremity Assessment: Generalized weakness   Lower Extremity Assessment Lower Extremity Assessment: Defer to PT evaluation   Cervical / Trunk Assessment Cervical / Trunk Assessment: Normal   Communication Communication Communication: No difficulties   Cognition Arousal/Alertness: Awake/alert Behavior During Therapy: WFL for tasks assessed/performed Overall Cognitive Status: No family/caregiver present to determine baseline cognitive functioning                                 General Comments: states he is here due to a fall, but able to recall with cues he was SOB; per chart review from previous admission, pt with intermittent confusion     General Comments  VSS On RA    Exercises     Shoulder Instructions      Home Living Family/patient expects to be discharged to:: Private residence Living Arrangements: Spouse/significant other Available Help at Discharge: Family Type of Home: House Home Access: Stairs to enter Technical brewer of Steps: 3 Entrance Stairs-Rails: Right Home Layout: One level     Bathroom Shower/Tub: Occupational psychologist: Standard Bathroom Accessibility: Yes   Home Equipment: Conservation officer, nature (2 wheels);Rollator (4 wheels);Cane - single point   Additional Comments: per granddaughter pt's spouse is minimally able to assist, is there 24/7 to supervise. Declined HH therapy at her home and per daughter, pt is out of SNF days from recent stay at [facility].      Prior  Functioning/Environment Prior Level of Function : Independent/Modified Independent             Mobility Comments: reports use of rollator at all times ADLs Comments: wife drives and assists with IADL tasks, per pt, he also assists spouse(?)        OT Problem List: Decreased strength;Decreased range of motion;Decreased activity tolerance;Impaired balance (sitting and/or standing);Decreased cognition;Decreased safety awareness      OT Treatment/Interventions: Self-care/ADL training;Therapeutic exercise;Energy conservation;DME and/or AE instruction;Therapeutic activities;Balance training;Patient/family education;Cognitive remediation/compensation    OT Goals(Current goals can be found in the care plan section) Acute Rehab OT Goals Patient Stated Goal: none stated OT Goal Formulation: With patient Time For Goal Achievement: 01/05/23 Potential to Achieve Goals: Good ADL Goals Pt Will Perform Upper Body Dressing: with supervision;sitting Pt Will Perform Lower Body Dressing: with supervision;sitting/lateral leans;sit to/from stand Pt Will Transfer to Toilet: with supervision;ambulating;regular height toilet Pt Will Perform Tub/Shower Transfer: Tub transfer;Shower transfer;with supervision;ambulating Additional ADL Goal #1: pt will follow 3 step command in prep for ADLs  OT Frequency: Min 2X/week    Co-evaluation              AM-PAC OT "6 Clicks" Daily Activity     Outcome Measure Help from another person eating meals?: None Help from another person taking care of personal grooming?: A Little Help from another person toileting, which includes using toliet, bedpan, or urinal?: A Lot Help from another person bathing (including washing, rinsing, drying)?: A  Lot Help from another person to put on and taking off regular upper body clothing?: A Little Help from another person to put on and taking off regular lower body clothing?: A Lot 6 Click Score: 16   End of Session Equipment  Utilized During Treatment: Rolling walker (2 wheels);Gait belt Nurse Communication: Mobility status  Activity Tolerance: Patient tolerated treatment well Patient left: in chair;with call bell/phone within reach;with chair alarm set  OT Visit Diagnosis: Unsteadiness on feet (R26.81);Other abnormalities of gait and mobility (R26.89);Muscle weakness (generalized) (M62.81);History of falling (Z91.81)                Time: [id number] OT Time Calculation (min): 22 min Charges:  OT General Charges $OT Visit: 1 Visit OT Evaluation $OT Eval Moderate Complexity: 1 Mod  Amer Alcindor K, OTD, OTR/L SecureChat Preferred Acute Rehab [phone]  Renaye Rakers Koonce 12/22/2022, 5:12 PM

## 2022-12-22 NOTE — Assessment & Plan Note (Addendum)
Continue blood pressure control with carvedilol. Now that renal function is improving, possible resumption of ARB, patient has been on lisinopril at home, 20 mg daily.

## 2022-12-23 ENCOUNTER — Inpatient Hospital Stay (HOSPITAL_COMMUNITY): Payer: No Typology Code available for payment source

## 2022-12-23 DIAGNOSIS — I2510 Atherosclerotic heart disease of native coronary artery without angina pectoris: Secondary | ICD-10-CM | POA: Diagnosis not present

## 2022-12-23 DIAGNOSIS — E1122 Type 2 diabetes mellitus with diabetic chronic kidney disease: Secondary | ICD-10-CM | POA: Diagnosis not present

## 2022-12-23 DIAGNOSIS — R0609 Other forms of dyspnea: Secondary | ICD-10-CM | POA: Diagnosis not present

## 2022-12-23 DIAGNOSIS — N184 Chronic kidney disease, stage 4 (severe): Secondary | ICD-10-CM | POA: Diagnosis not present

## 2022-12-23 DIAGNOSIS — I5023 Acute on chronic systolic (congestive) heart failure: Secondary | ICD-10-CM | POA: Diagnosis not present

## 2022-12-23 LAB — BASIC METABOLIC PANEL
Anion gap: 9 (ref 5–15)
BUN: 48 mg/dL — ABNORMAL HIGH (ref 8–23)
CO2: 23 mmol/L (ref 22–32)
Calcium: 8.6 mg/dL — ABNORMAL LOW (ref 8.9–10.3)
Chloride: 104 mmol/L (ref 98–111)
Creatinine, Ser: 2.74 mg/dL — ABNORMAL HIGH (ref 0.61–1.24)
GFR, Estimated: 23 mL/min — ABNORMAL LOW (ref >60)
Glucose, Bld: 94 mg/dL (ref 70–99)
Potassium: 4.1 mmol/L (ref 3.5–5.1)
Sodium: 136 mmol/L (ref 135–145)

## 2022-12-23 LAB — GLUCOSE, CAPILLARY
Glucose-Capillary: 141 mg/dL — ABNORMAL HIGH (ref 70–99)
Glucose-Capillary: 78 mg/dL (ref 70–99)
Glucose-Capillary: 79 mg/dL (ref 70–99)
Glucose-Capillary: 86 mg/dL (ref 70–99)
Glucose-Capillary: 91 mg/dL (ref 70–99)
Glucose-Capillary: 97 mg/dL (ref 70–99)

## 2022-12-23 LAB — CBC
HCT: 31.4 % — ABNORMAL LOW (ref 39.0–52.0)
Hemoglobin: 10.2 g/dL — ABNORMAL LOW (ref 13.0–17.0)
MCH: 30.5 pg (ref 26.0–34.0)
MCHC: 32.5 g/dL (ref 30.0–36.0)
MCV: 94 fL (ref 80.0–100.0)
Platelets: 152 10*3/uL (ref 150–400)
RBC: 3.34 MIL/uL — ABNORMAL LOW (ref 4.22–5.81)
RDW: 14.2 % (ref 11.5–15.5)
WBC: 7 10*3/uL (ref 4.0–10.5)
nRBC: 0 % (ref 0.0–0.2)

## 2022-12-23 LAB — ECHOCARDIOGRAM LIMITED
Height: 69 in
Weight: 2645.52 oz

## 2022-12-23 LAB — MAGNESIUM: Magnesium: 2 mg/dL (ref 1.7–2.4)

## 2022-12-23 MED ORDER — PERFLUTREN LIPID MICROSPHERE
1.0000 mL | INTRAVENOUS | Status: AC | PRN
  Administered 2022-12-23: 2 mL via INTRAVENOUS

## 2022-12-23 NOTE — Progress Notes (Signed)
  Echocardiogram 2D Echocardiogram has been performed.  [name] 12/23/2022, 12:11 PM

## 2022-12-23 NOTE — Plan of Care (Signed)

## 2022-12-23 NOTE — Progress Notes (Signed)
Progress Note   Patient: [name] DOB: 05/09/1946 DOA: 12/21/2022     1 DOS: the patient was seen and examined on 12/23/2022   Brief hospital course: [name] was admitted to the hospital with the working diagnosis of heart failure exacerbation.   77 yo male with the past medical history of hypertension, T2DM, coronary artery disease, hx of CVA, BPH and carotid stenosis with presented with dyspnea. Reported rapid worsening of dyspnea. Last night he had PND and was note able to cath his breath. EMS was called and he was found hypertensive with systolic in the [id number] and hypoxemic with 02 saturation 47% on room air. He received nitroglycerin SL, placed on Cpap and transported to the ED. In the emergency room he was placed on Bipap, for respiratory distress, blood pressure 132/74, HR 68, RR 19 and 02 saturation 100%, lungs with rales but no wheezing, heart with S1 and S2 present and rhythmic, abdomen with no distention, and no lower extremity edema.   Na 135, K 5.1 CL 105 bicarbonate 23, glucose 125 bun 33 cr 2,24  BNP 719 High sensitive troponin 20, 87 and 474  Lactic acid 3,2  Wbc 14.7 hgb 10,9 plt 211  Sars covid 19 negative   Chest radiograph with mild cardiomegaly with bilateral hilar vascular congestion and cephalization of the vasculature.  Sternotomy wires in place.   EKG 92 bpm, left axis deviation, interventricular conduction delay, not  qtc prolongation, sinus rhythm with q wave V1 and V2 with no significant ST segment or T wave changes.    Assessment and Plan: * Acute on chronic systolic CHF (congestive heart failure) (HCC) Follow up echocardiogram with reduced LV systolic function with EF 35 to 40%, entire inferior wall, basal infero lateral segment, and apical septal segment are akinetic. The entire anterior wall, antero lateral wall and mid distal lateral wall, anterior septum mid inferoseptal, basal infero septal segment and apex are akinetic.   Volume  status has improved.  Urine output is 1600 ml  Continue with carvedilol,and losartan.  Holding on spironolactone and SGLT 2 inh due to low GFR.  CAD (coronary artery disease) Sp CABG, with ischemic heart disease.  Patient with chest pain at home along with dyspnea. No active chest pain.  Echocardiogram with new wall motion abnormalities anterior wall akinetic and hypokinetic.   Plan to continue medical therapy with aspirin, clopidogrel and atorvastatin. Probably not good candidate for coronary angiography due to low GFR. Will consult cardiology for recommendations.   Essential hypertension Continue blood pressure control with carvedilol and losartan.   CKD (chronic kidney disease) stage 4, GFR 15-29 ml/min (HCC) AKI,   Improved volume status.  Renal function with serum cr at 2,74 with K at 4,1 and serum bicarbonate at 23.  Na 136.   Plan to hold on furosemide for today and follow up renal function in am.  Continue losartan for now.   Type 2 diabetes mellitus (HCC) Fasting glucose is 94 mg/dl. Plan to continue glucose cover and monitoring.   History of CVA (cerebrovascular accident) Continue blood pressure control, statin and dual antiplatelet therapy.    Subjective: Today is feeling better, no chest pain, no dyspnea.   Physical Exam: Vitals:   12/23/22 0355 12/23/22 0712 12/23/22 1121 12/23/22 1525  BP: 131/66 (!) 132/55 (!) 103/57 (!) 148/61  Pulse: 67 67 60 67  Resp: 12 16 17 18   Temp: 98.3 F (36.8 C) 98.4 F (36.9 C) 98.4 F (36.9 C) 97.7 F (  36.5 C)  TempSrc: Oral Oral Oral Oral  SpO2: 90% 97% 91% 100%  Weight:      Height:       Neurology awake and alert ENT with mild pallor Cardiovascular with S1 and S2 present and rhythmic with no gallops, or rubs, positive murmur at the apex, systolic.  No JVD No lower extremity edema Respiratory with no rales or wheezing, no rhonchi Abdomen with no distention  Data Reviewed:    Family Communication: no  family at the bedside   Disposition: Status is: Inpatient Remains inpatient appropriate because: heart failure   Planned Discharge Destination: Home      Author: Tawni Millers, MD 12/23/2022 3:54 PM  For on call review [email or web address].

## 2022-12-23 NOTE — Evaluation (Signed)
Physical Therapy Evaluation Patient Details Name: [name] MRN: [id number] DOB: [birth year] Today's Date: 12/23/2022  History of Present Illness  Pt is a 77 y/o M presenting to ED on 3/19 from home with SOB (SpO2 47% on RA) and hypertensive, admitted for acute on chronic systolic CHF. PMH includes HTN, DM2, CAD, COPD, CVA, BPH, carotid artery stenosis, Vitamin B12 deficiency, AAA.  Clinical Impression  Pt presents with admitting diagnosis above. Pt tolerated treatment well today. Pt was able to ambulate in hallway with rollator and navigate 2 stairs. Pt moves fairly well with rollator and likely close to baseline however pt initially tried to ascend stairs with rollator and states that is how he does it at home. Pt educated on safety and proper stair technique. Carryover questionable and pt likely will need 1 additional visit for safety reinforcement. Do not anticipate that pt will need post acute PT.     Recommendations for follow up therapy are one component of a multi-disciplinary discharge planning process, led by the attending physician.  Recommendations may be updated based on patient status, additional functional criteria and insurance authorization.  Follow Up Recommendations No PT follow up      Assistance Recommended at Discharge Set up Supervision/Assistance  Patient can return home with the following  Help with stairs or ramp for entrance;Assist for transportation;Assistance with cooking/housework;A little help with walking and/or transfers;A little help with bathing/dressing/bathroom    Equipment Recommendations None recommended by PT (Has needed DME)  Recommendations for Other Services       Functional Status Assessment Patient has had a recent decline in their functional status and demonstrates the ability to make significant improvements in function in a reasonable and predictable amount of time.     Precautions / Restrictions Precautions Precautions:  Fall Restrictions Weight Bearing Restrictions: No      Mobility  Bed Mobility               General bed mobility comments: Pt received up in chair.    Transfers Overall transfer level: Needs assistance Equipment used: Rollator (4 wheels) Transfers: Sit to/from Stand Sit to Stand: Min guard           General transfer comment: Cues for hand placement and locking rollator brakes.    Ambulation/Gait Ambulation/Gait assistance: Min guard Gait Distance (Feet): 100 Feet Assistive device: Rollator (4 wheels) Gait Pattern/deviations: Step-through pattern, Decreased stride length, Decreased step length - left, Decreased stance time - left, Trunk flexed Gait velocity: variable     General Gait Details: No LOB noted.  Stairs Stairs: Yes Stairs assistance: Min guard Stair Management: One rail Right, Forwards, Step to pattern Number of Stairs: 2 General stair comments: Pt initially tried to ascending stairs with rollator and states that is how he does it at home. Pt educated on safety and proper stair technique. Carryover questionable and pt likely will need 1 additional visit for safety reinforcement.  Wheelchair Mobility    Modified Rankin (Stroke Patients Only)       Balance Overall balance assessment: Needs assistance Sitting-balance support: Feet supported Sitting balance-Leahy Scale: Good     Standing balance support: During functional activity, Reliant on assistive device for balance, Bilateral upper extremity supported Standing balance-Leahy Scale: Good Standing balance comment: Pt able to briefly ambulate without AD at end of session transfering to chair.  Pertinent Vitals/Pain Pain Assessment Pain Assessment: No/denies pain    Home Living Family/patient expects to be discharged to:: Private residence Living Arrangements: Spouse/significant other Available Help at Discharge: Family Type of Home: House Home  Access: Stairs to enter Entrance Stairs-Rails: Right Entrance Stairs-Number of Steps: 3 (3 in the front, 2 on the side and back with no rails. Pt prefers to go in through side entrance.)   Home Layout: One level Home Equipment: Conservation officer, nature (2 wheels);Rollator (4 wheels);Cane - single point;Grab bars - tub/shower Additional Comments: Pt states his wife is there 24/7 but minimally able to assist.    Prior Function Prior Level of Function : Independent/Modified Independent             Mobility Comments: reports use of rollator at all times ADLs Comments: wife drives and assists with IADL tasks, per pt, he also assists spouse(?)     Hand Dominance   Dominant Hand: Left    Extremity/Trunk Assessment   Upper Extremity Assessment Upper Extremity Assessment: Overall WFL for tasks assessed    Lower Extremity Assessment Lower Extremity Assessment: Overall WFL for tasks assessed    Cervical / Trunk Assessment Cervical / Trunk Assessment: Normal  Communication   Communication: No difficulties  Cognition Arousal/Alertness: Awake/alert Behavior During Therapy: WFL for tasks assessed/performed Overall Cognitive Status: No family/caregiver present to determine baseline cognitive functioning                                 General Comments: Pt states that he is here because of fall however chart states he is here for SOB.        General Comments General comments (skin integrity, edema, etc.): VSS on RA    Exercises     Assessment/Plan    PT Assessment Patient needs continued PT services  PT Problem List Decreased strength;Decreased activity tolerance;Decreased balance;Decreased mobility;Decreased cognition;Decreased knowledge of use of DME       PT Treatment Interventions DME instruction;Stair training;Gait training;Functional mobility training;Therapeutic activities;Balance training;Neuromuscular re-education;Patient/family education    PT Goals (Current  goals can be found in the Care Plan section)  Acute Rehab PT Goals Patient Stated Goal: To go home PT Goal Formulation: With patient Time For Goal Achievement: 01/06/23 Potential to Achieve Goals: Good    Frequency Min 1X/week     Co-evaluation               AM-PAC PT "6 Clicks" Mobility  Outcome Measure Help needed turning from your back to your side while in a flat bed without using bedrails?: None Help needed moving from lying on your back to sitting on the side of a flat bed without using bedrails?: None Help needed moving to and from a bed to a chair (including a wheelchair)?: A Little Help needed standing up from a chair using your arms (e.g., wheelchair or bedside chair)?: A Little Help needed to walk in hospital room?: A Little Help needed climbing 3-5 steps with a railing? : A Little 6 Click Score: 20    End of Session Equipment Utilized During Treatment: Gait belt Activity Tolerance: Patient tolerated treatment well Patient left: in chair;with call bell/phone within reach Nurse Communication: Mobility status PT Visit Diagnosis: Other abnormalities of gait and mobility (R26.89)    Time: [id number] PT Time Calculation (min) (ACUTE ONLY): 26 min   Charges:   PT Evaluation $PT Eval Moderate Complexity: 1 Mod PT Treatments $Gait Training: 8-22  mins        Nickolas Madrid, DPT Acute Rehab Services [id number]   Viann Shove 12/23/2022, 12:44 PM
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
## 2022-12-24 ENCOUNTER — Encounter (HOSPITAL_COMMUNITY)
Admission: EM | Disposition: A | Discharge status: Home / Self Care | Payer: Self-pay | Source: Home / Self Care | Attending: Internal Medicine

## 2022-12-24 ENCOUNTER — Inpatient Hospital Stay (HOSPITAL_COMMUNITY): Payer: No Typology Code available for payment source

## 2022-12-24 DIAGNOSIS — I5023 Acute on chronic systolic (congestive) heart failure: Secondary | ICD-10-CM | POA: Diagnosis not present

## 2022-12-24 DIAGNOSIS — I10 Essential (primary) hypertension: Secondary | ICD-10-CM | POA: Diagnosis not present

## 2022-12-24 DIAGNOSIS — I2510 Atherosclerotic heart disease of native coronary artery without angina pectoris: Secondary | ICD-10-CM | POA: Diagnosis not present

## 2022-12-24 DIAGNOSIS — N184 Chronic kidney disease, stage 4 (severe): Secondary | ICD-10-CM | POA: Diagnosis not present

## 2022-12-24 HISTORY — PX: RIGHT HEART CATH: CATH118263

## 2022-12-24 LAB — POCT I-STAT EG7
Acid-base deficit: 6 mmol/L — ABNORMAL HIGH (ref 0.0–2.0)
Acid-base deficit: 6 mmol/L — ABNORMAL HIGH (ref 0.0–2.0)
Bicarbonate: 18.6 mmol/L — ABNORMAL LOW (ref 20.0–28.0)
Bicarbonate: 19.5 mmol/L — ABNORMAL LOW (ref 20.0–28.0)
Calcium, Ion: 1.03 mmol/L — ABNORMAL LOW (ref 1.15–1.40)
Calcium, Ion: 1.18 mmol/L (ref 1.15–1.40)
HCT: 27 % — ABNORMAL LOW (ref 39.0–52.0)
HCT: 29 % — ABNORMAL LOW (ref 39.0–52.0)
Hemoglobin: 9.2 g/dL — ABNORMAL LOW (ref 13.0–17.0)
Hemoglobin: 9.9 g/dL — ABNORMAL LOW (ref 13.0–17.0)
O2 Saturation: 57 %
O2 Saturation: 60 %
Potassium: 4 mmol/L (ref 3.5–5.1)
Potassium: 4.3 mmol/L (ref 3.5–5.1)
Sodium: 141 mmol/L (ref 135–145)
Sodium: 144 mmol/L (ref 135–145)
TCO2: 20 mmol/L — ABNORMAL LOW (ref 22–32)
TCO2: 21 mmol/L — ABNORMAL LOW (ref 22–32)
pCO2, Ven: 34.2 mmHg — ABNORMAL LOW (ref 44–60)
pCO2, Ven: 35.5 mmHg — ABNORMAL LOW (ref 44–60)
pH, Ven: 7.343 (ref 7.25–7.43)
pH, Ven: 7.348 (ref 7.25–7.43)
pO2, Ven: 31 mmHg — CL (ref 32–45)
pO2, Ven: 32 mmHg (ref 32–45)

## 2022-12-24 LAB — BASIC METABOLIC PANEL
Anion gap: 11 (ref 5–15)
BUN: 61 mg/dL — ABNORMAL HIGH (ref 8–23)
CO2: 19 mmol/L — ABNORMAL LOW (ref 22–32)
Calcium: 8.5 mg/dL — ABNORMAL LOW (ref 8.9–10.3)
Chloride: 109 mmol/L (ref 98–111)
Creatinine, Ser: 3.29 mg/dL — ABNORMAL HIGH (ref 0.61–1.24)
GFR, Estimated: 18 mL/min — ABNORMAL LOW (ref >60)
Glucose, Bld: 120 mg/dL — ABNORMAL HIGH (ref 70–99)
Potassium: 4 mmol/L (ref 3.5–5.1)
Sodium: 139 mmol/L (ref 135–145)

## 2022-12-24 LAB — ECHOCARDIOGRAM LIMITED
AR max vel: 2.71 cm2
AV Area VTI: 2.18 cm2
AV Area mean vel: 1.96 cm2
AV Mean grad: 4 mmHg
AV Peak grad: 4.2 mmHg
Ao pk vel: 1.02 m/s
Calc EF: 35.9 %
Height: 69 in
S' Lateral: 4.4 cm
Single Plane A2C EF: 46.9 %
Single Plane A4C EF: 25.2 %
Weight: 2577.6 oz

## 2022-12-24 LAB — CBC
HCT: 31.5 % — ABNORMAL LOW (ref 39.0–52.0)
Hemoglobin: 9.8 g/dL — ABNORMAL LOW (ref 13.0–17.0)
MCH: 30.5 pg (ref 26.0–34.0)
MCHC: 31.1 g/dL (ref 30.0–36.0)
MCV: 98.1 fL (ref 80.0–100.0)
Platelets: 164 10*3/uL (ref 150–400)
RBC: 3.21 MIL/uL — ABNORMAL LOW (ref 4.22–5.81)
RDW: 14.3 % (ref 11.5–15.5)
WBC: 6.5 10*3/uL (ref 4.0–10.5)
nRBC: 0 % (ref 0.0–0.2)

## 2022-12-24 LAB — GLUCOSE, CAPILLARY
Glucose-Capillary: 120 mg/dL — ABNORMAL HIGH (ref 70–99)
Glucose-Capillary: 115 mg/dL — ABNORMAL HIGH (ref 70–99)
Glucose-Capillary: 116 mg/dL — ABNORMAL HIGH (ref 70–99)
Glucose-Capillary: 211 mg/dL — ABNORMAL HIGH (ref 70–99)
Glucose-Capillary: 135 mg/dL — ABNORMAL HIGH (ref 70–99)

## 2022-12-24 LAB — LACTIC ACID, PLASMA: Lactic Acid, Venous: 1.3 mmol/L (ref 0.5–1.9)

## 2022-12-24 LAB — CREATININE, SERUM
Creatinine, Ser: 3 mg/dL — ABNORMAL HIGH (ref 0.61–1.24)
GFR, Estimated: 21 mL/min — ABNORMAL LOW (ref >60)

## 2022-12-24 LAB — MAGNESIUM: Magnesium: 2 mg/dL (ref 1.7–2.4)

## 2022-12-24 LAB — TROPONIN I (HIGH SENSITIVITY): Troponin I (High Sensitivity): 365 ng/L (ref <18)

## 2022-12-24 SURGERY — RIGHT HEART CATH
Anesthesia: LOCAL

## 2022-12-24 MED ORDER — SODIUM CHLORIDE 0.9% FLUSH: 3.0000 mL | INTRAVENOUS | Status: DC | PRN

## 2022-12-24 MED ORDER — SODIUM CHLORIDE 0.9% FLUSH
3.0000 mL | Freq: Two times a day (BID) | INTRAVENOUS | Status: DC
  Administered 2022-12-25 – 2022-12-29 (×6): 3 mL via INTRAVENOUS

## 2022-12-24 MED ORDER — ASPIRIN 81 MG PO CHEW: 81.0000 mg | CHEWABLE_TABLET | ORAL | Status: DC

## 2022-12-24 MED ORDER — HEPARIN (PORCINE) IN NACL 1000-0.9 UT/500ML-% IV SOLN
INTRAVENOUS | Status: DC | PRN
  Administered 2022-12-24 (×2): 500 mL

## 2022-12-24 MED ORDER — SODIUM CHLORIDE 0.9 % IV SOLN: INTRAVENOUS | Status: DC

## 2022-12-24 MED ORDER — REGADENOSON 0.4 MG/5ML IV SOLN
0.4000 mg | Freq: Once | INTRAVENOUS | Status: AC
  Filled 2022-12-24: qty 5

## 2022-12-24 MED ORDER — SODIUM CHLORIDE 0.9 % IV SOLN: 250.0000 mL | INTRAVENOUS | Status: DC | PRN

## 2022-12-24 MED ORDER — SODIUM CHLORIDE 0.9 % IV SOLN
INTRAVENOUS | Status: AC | PRN
  Administered 2022-12-24: 320 mL via INTRAVENOUS

## 2022-12-24 MED ORDER — PERFLUTREN LIPID MICROSPHERE
1.0000 mL | INTRAVENOUS | Status: AC | PRN
  Administered 2022-12-24: 5 mL via INTRAVENOUS

## 2022-12-24 MED ORDER — ENOXAPARIN SODIUM 40 MG/0.4ML IJ SOSY: 40.0000 mg | PREFILLED_SYRINGE | INTRAMUSCULAR | Status: DC

## 2022-12-24 MED ORDER — LIDOCAINE HCL (PF) 1 % IJ SOLN
INTRAMUSCULAR | Status: AC
  Filled 2022-12-24: qty 30

## 2022-12-24 MED ORDER — SODIUM CHLORIDE 0.9% FLUSH
3.0000 mL | Freq: Two times a day (BID) | INTRAVENOUS | Status: DC
  Administered 2022-12-24 – 2022-12-29 (×6): 3 mL via INTRAVENOUS

## 2022-12-24 MED ORDER — ACETAMINOPHEN 325 MG PO TABS: 650.0000 mg | ORAL_TABLET | ORAL | Status: DC | PRN

## 2022-12-24 MED ORDER — CARVEDILOL 3.125 MG PO TABS
3.1250 mg | ORAL_TABLET | Freq: Two times a day (BID) | ORAL | Status: DC
  Administered 2022-12-24 – 2022-12-29 (×8): 3.125 mg via ORAL
  Filled 2022-12-24 (×8): qty 1

## 2022-12-24 MED ORDER — SODIUM CHLORIDE 0.9 % IV SOLN: INTRAVENOUS | Status: AC

## 2022-12-24 MED ORDER — LIDOCAINE HCL (PF) 1 % IJ SOLN
INTRAMUSCULAR | Status: DC | PRN
  Administered 2022-12-24 (×2): 2 mL

## 2022-12-24 MED ORDER — HYDRALAZINE HCL 20 MG/ML IJ SOLN: 10.0000 mg | INTRAMUSCULAR | Status: AC | PRN

## 2022-12-24 SURGICAL SUPPLY — 11 items
CATH BALLN WEDGE 5F 110CM (CATHETERS) IMPLANT
CATH SWAN GANZ 7F STRAIGHT (CATHETERS) IMPLANT
GLIDESHEATH SLENDER 7FR .021G (SHEATH) IMPLANT
GUIDEWIRE .025 260CM (WIRE) IMPLANT
PACK CARDIAC CATHETERIZATION (CUSTOM PROCEDURE TRAY) ×1 IMPLANT
SHEATH PINNACLE 7F 10CM (SHEATH) IMPLANT
SHEATH PROBE COVER 6X72 (BAG) IMPLANT
TRANSDUCER W/STOPCOCK (MISCELLANEOUS) ×1 IMPLANT
TUBING ART PRESS 72  MALE/FEM (TUBING) ×1
TUBING ART PRESS 72 MALE/FEM (TUBING) IMPLANT
WIRE MICRO SET SILHO 5FR 7 (SHEATH) IMPLANT

## 2022-12-24 NOTE — Progress Notes (Signed)
OT Cancellation Note  Patient Details Name: [name] MRN: [id number] DOB: 1946-08-08   Cancelled Treatment:    Reason Eval/Treat Not Completed: Patient at procedure or test/ unavailable (Pt at cath lab. Will follow up another time.)  [facility] 12/24/2022, 1:39 PM Maurie Boettcher, OT/L   Acute OT Clinical Specialist Acute Rehabilitation Services Pager [phone] Office [phone]

## 2022-12-24 NOTE — Interval H&P Note (Signed)
History and Physical Interval Note:  12/24/2022 12:44 PM  [name]  has presented today for surgery, with the diagnosis of hf.  The various methods of treatment have been discussed with the patient and family. After consideration of risks, benefits and other options for treatment, the patient has consented to  Procedure(s): RIGHT HEART CATH (N/A) as a surgical intervention.  The patient's history has been reviewed, patient examined, no change in status, stable for surgery.  I have reviewed the patient's chart and labs.  Questions were answered to the patient's satisfaction.     [name]
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
## 2022-12-24 NOTE — Progress Notes (Signed)
Progress Note   Patient: [name] [id number] DOB: 10-19-1945 DOA: 12/21/2022     2 DOS: the patient was seen and examined on 12/24/2022   Brief hospital course: Mr. Adamczyk was admitted to the hospital with the working diagnosis of heart failure exacerbation.   77 yo male with the past medical history of hypertension, T2DM, coronary artery disease, hx of CVA, BPH and carotid stenosis with presented with dyspnea. Reported rapid worsening of dyspnea. Last night he had PND and was note able to cath his breath. EMS was called and he was found hypertensive with systolic in the [id number] and hypoxemic with 02 saturation 47% on room air. He received nitroglycerin SL, placed on Cpap and transported to the ED. In the emergency room he was placed on Bipap, for respiratory distress, blood pressure 132/74, HR 68, RR 19 and 02 saturation 100%, lungs with rales but no wheezing, heart with S1 and S2 present and rhythmic, abdomen with no distention, and no lower extremity edema.   Na 135, K 5.1 CL 105 bicarbonate 23, glucose 125 bun 33 cr 2,24  BNP 719 High sensitive troponin 20, 87 and 474  Lactic acid 3,2  Wbc 14.7 hgb 10,9 plt 211  Sars covid 19 negative   Chest radiograph with mild cardiomegaly with bilateral hilar vascular congestion and cephalization of the vasculature.  Sternotomy wires in place.   EKG 92 bpm, left axis deviation, interventricular conduction delay, not  qtc prolongation, sinus rhythm with q wave V1 and V2 with no significant ST segment or T wave changes.   03/22 echocardiogram with wall motion abnormalities.  Volume status has improved.  Consulted cardiology.   Assessment and Plan: * Acute on chronic systolic CHF (congestive heart failure) (HCC) Follow up echocardiogram with reduced LV systolic function with EF 35 to 40%, entire inferior wall, basal infero lateral segment, and apical septal segment are akinetic. The entire anterior wall, antero lateral wall and mid distal  lateral wall, anterior septum mid inferoseptal, basal infero septal segment and apex are akinetic. No significant valvular disease.   Volume status has improved.  Urine output is 500 ml Since admission patient has lost 4 kg.   Continue with carvedilol,and losartan.  Holding on spironolactone and SGLT 2 inh due to low GFR. Considering new all motion abnormalities consulted cardiology.   CAD (coronary artery disease) Sp CABG, with ischemic heart disease.  Patient with chest pain at home along with dyspnea. No active chest pain.  Echocardiogram with new wall motion abnormalities anterior wall akinetic and hypokinetic.   Plan to continue medical therapy with aspirin, clopidogrel and atorvastatin. Follow up with cardiology recommendations.   Essential hypertension Continue blood pressure control with carvedilol and losartan.   CKD (chronic kidney disease) stage 4, GFR 15-29 ml/min (HCC) AKI,   Improved volume status.  Renal function with serum cr at 3,29 with K at 4,0 and serum bicarbonate at 19.  Na is 139 Mg 2.0   Continue to hold diuresis.  Continue with losartan (at home patient on lisinopril). Follow up renal function in am.  Avoid hypotension.   Type 2 diabetes mellitus (HCC) Fasting glucose is 120 mg/dl. Plan to continue glucose cover and monitoring.   History of CVA (cerebrovascular accident) Continue blood pressure control, statin and dual antiplatelet therapy.     Subjective: Patient this am is out of bed to the chair, no dyspnea or chest pain, he was not able to sleep well last night due to interruptions, no PND or  orthopnea.   Physical Exam: Vitals:   12/23/22 1945 12/24/22 0040 12/24/22 0455 12/24/22 0740  BP: (!) 115/51 (!) 114/54 129/62 (!) 143/69  Pulse: 60 65 69   Resp: 14 19 17  (!) 24  Temp: 99 F (37.2 C) 99 F (37.2 C) 99 F (37.2 C) 98.4 F (36.9 C)  TempSrc: Oral Oral Oral Oral  SpO2: 98% 95% 97%   Weight:   73.1 kg   Height:        Neurology awake and alert ENT with mild pallor Cardiovascular with S1 and S2 present and regular with no gallops, rubs or murmurs No JVD No lower extremity edema Respiratory with no wheezing, no rhonchi, or rales Abdomen with no distention  Data Reviewed:    Family Communication: no family at the bedside   Disposition: Status is: Inpatient Remains inpatient appropriate because: further cardiac workup   Planned Discharge Destination: Home   Author: Tawni Millers, MD 12/24/2022 9:19 AM  For on call review [email or web address].
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
## 2022-12-24 NOTE — Progress Notes (Signed)
Heart Failure Navigator Progress Note  Assessed for Heart & Vascular TOC clinic readiness.  Patient does not meet criteria due to Advanced Heart Failure team consult. .   Navigator will sign off at this time.   Earnestine Leys, BSN, Clinical cytogeneticist Only

## 2022-12-24 NOTE — Progress Notes (Signed)
PT Cancellation Note  Patient Details Name: [name] MRN: [id number] DOB: 1945-12-30   Cancelled Treatment:    Reason Eval/Treat Not Completed: Patient at procedure or test/unavailable (Pt is off the floor at Cath Lab. Will try again later as time permits)   Viann Shove 12/24/2022, 1:25 PM

## 2022-12-25 ENCOUNTER — Inpatient Hospital Stay (HOSPITAL_COMMUNITY): Payer: No Typology Code available for payment source

## 2022-12-25 DIAGNOSIS — E1122 Type 2 diabetes mellitus with diabetic chronic kidney disease: Secondary | ICD-10-CM | POA: Diagnosis not present

## 2022-12-25 DIAGNOSIS — I2510 Atherosclerotic heart disease of native coronary artery without angina pectoris: Secondary | ICD-10-CM | POA: Diagnosis not present

## 2022-12-25 DIAGNOSIS — N184 Chronic kidney disease, stage 4 (severe): Secondary | ICD-10-CM | POA: Diagnosis not present

## 2022-12-25 DIAGNOSIS — I5023 Acute on chronic systolic (congestive) heart failure: Secondary | ICD-10-CM | POA: Diagnosis not present

## 2022-12-25 LAB — RENAL FUNCTION PANEL
Albumin: 3.2 g/dL — ABNORMAL LOW (ref 3.5–5.0)
Anion gap: 11 (ref 5–15)
BUN: 63 mg/dL — ABNORMAL HIGH (ref 8–23)
CO2: 18 mmol/L — ABNORMAL LOW (ref 22–32)
Calcium: 8.4 mg/dL — ABNORMAL LOW (ref 8.9–10.3)
Chloride: 110 mmol/L (ref 98–111)
Creatinine, Ser: 2.99 mg/dL — ABNORMAL HIGH (ref 0.61–1.24)
GFR, Estimated: 21 mL/min — ABNORMAL LOW (ref >60)
Glucose, Bld: 101 mg/dL — ABNORMAL HIGH (ref 70–99)
Phosphorus: 5.3 mg/dL — ABNORMAL HIGH (ref 2.5–4.6)
Potassium: 3.9 mmol/L (ref 3.5–5.1)
Sodium: 139 mmol/L (ref 135–145)

## 2022-12-25 LAB — GLUCOSE, CAPILLARY
Glucose-Capillary: 102 mg/dL — ABNORMAL HIGH (ref 70–99)
Glucose-Capillary: 106 mg/dL — ABNORMAL HIGH (ref 70–99)
Glucose-Capillary: 107 mg/dL — ABNORMAL HIGH (ref 70–99)
Glucose-Capillary: 110 mg/dL — ABNORMAL HIGH (ref 70–99)
Glucose-Capillary: 118 mg/dL — ABNORMAL HIGH (ref 70–99)
Glucose-Capillary: 135 mg/dL — ABNORMAL HIGH (ref 70–99)

## 2022-12-25 MED ORDER — ENOXAPARIN SODIUM 30 MG/0.3ML IJ SOSY
30.0000 mg | PREFILLED_SYRINGE | INTRAMUSCULAR | Status: DC
  Administered 2022-12-25 – 2022-12-29 (×5): 30 mg via SUBCUTANEOUS
  Filled 2022-12-25 (×5): qty 0.3

## 2022-12-25 NOTE — Progress Notes (Signed)
Physical Therapy Treatment Patient Details Name: [name] MRN: [id number] DOB: 1945/11/18 Today's Date: 12/25/2022   History of Present Illness Pt is a 77 y/o M presenting to ED on 3/19 from home with SOB (SpO2 47% on RA) and hypertensive, admitted for acute on chronic systolic CHF. PMH includes HTN, DM2, CAD, COPD, CVA, BPH, carotid artery stenosis, Vitamin B12 deficiency, AAA.    PT Comments    Pt supine in bed agreeable to working with therapy. Focus of session to review stair training. Pt supervision for bed mobility, transfers and ambulation with Rollator. Went into stairwell and patient able to demonstrate how he moves Rollator up and down the stairs into and out of his home. Overall, min guard for ascent/descent of 2 steps with use of rail on R. Pt reports he has been going up and down the stairs like that for 2 years without problems. Discharging pt from PT service as needs for maintaining level of mobility can be filled by Mobility Specialist. PT signing off.      Recommendations for follow up therapy are one component of a multi-disciplinary discharge planning process, led by the attending physician.  Recommendations may be updated based on patient status, additional functional criteria and insurance authorization.  Follow Up Recommendations  No PT follow up     Assistance Recommended at Discharge Set up Supervision/Assistance  Patient can return home with the following Help with stairs or ramp for entrance;Assist for transportation;Assistance with cooking/housework;A little help with walking and/or transfers;A little help with bathing/dressing/bathroom   Equipment Recommendations  None recommended by PT (Has needed DME)       Precautions / Restrictions Precautions Precautions: Fall Restrictions Weight Bearing Restrictions: No     Mobility  Bed Mobility Overal bed mobility: Needs Assistance Bed Mobility: Supine to Sit     Supine to sit: Supervision, HOB  elevated     General bed mobility comments: use of bed rail to pull to EoB    Transfers Overall transfer level: Needs assistance Equipment used: Rollator (4 wheels) Transfers: Sit to/from Stand Sit to Stand: Supervision           General transfer comment: gets up to rollator without assist, needs cuing to stop and wait for PT to disconnect tele box    Ambulation/Gait Ambulation/Gait assistance: Supervision Gait Distance (Feet): 300 Feet Assistive device: Rollator (4 wheels) Gait Pattern/deviations: Step-through pattern, Decreased stride length, Decreased step length - left, Decreased stance time - left, Trunk flexed Gait velocity: variable     General Gait Details: No LOB noted.   Stairs Stairs: Yes Stairs assistance: Min guard Stair Management: One rail Right, Forwards, Step to pattern   General stair comments: Pt talks his way through how he gets his Rollator up and down the 2 steps into his house, with use of rail for support, pt reports he's been doing it for 2 years and knows what he is doing       Balance Overall balance assessment: Needs assistance Sitting-balance support: Feet supported Sitting balance-Leahy Scale: Good     Standing balance support: During functional activity, Reliant on assistive device for balance, Bilateral upper extremity supported Standing balance-Leahy Scale: Good Standing balance comment: able to perform dynamic balance to adjust bed pad without UE support                            Cognition Arousal/Alertness: Awake/alert Behavior During Therapy: WFL for tasks assessed/performed Overall Cognitive  Status: No family/caregiver present to determine baseline cognitive functioning                                 General Comments: Pt states that he is here because of fall however chart states he is here for SOB.           General Comments General comments (skin integrity, edema, etc.): VSS on RA       Pertinent Vitals/Pain Pain Assessment Pain Assessment: No/denies pain     PT Goals (current goals can now be found in the care plan section) Acute Rehab PT Goals Patient Stated Goal: To go home PT Goal Formulation: With patient Time For Goal Achievement: 01/06/23 Potential to Achieve Goals: Good Progress towards PT goals: Progressing toward goals    Frequency    Min 1X/week      PT Plan Current plan remains appropriate       AM-PAC PT "6 Clicks" Mobility   Outcome Measure  Help needed turning from your back to your side while in a flat bed without using bedrails?: None Help needed moving from lying on your back to sitting on the side of a flat bed without using bedrails?: None Help needed moving to and from a bed to a chair (including a wheelchair)?: None Help needed standing up from a chair using your arms (e.g., wheelchair or bedside chair)?: None Help needed to walk in hospital room?: None Help needed climbing 3-5 steps with a railing? : A Little 6 Click Score: 23    End of Session Equipment Utilized During Treatment: Gait belt Activity Tolerance: Patient tolerated treatment well Patient left: with call bell/phone within reach;in bed Nurse Communication: Mobility status PT Visit Diagnosis: Other abnormalities of gait and mobility (R26.89)     Time: [id number] PT Time Calculation (min) (ACUTE ONLY): 16 min  Charges:  $Gait Training: 8-22 mins                     [name] PT, DPT Acute Rehabilitation Services Please use secure chat or  Call Office [phone]    [place] 12/25/2022, 4:33 PM

## 2022-12-25 NOTE — Progress Notes (Signed)
Progress Note   Patient: [name] [id number] DOB: 03-20-1946 DOA: 12/21/2022     3 DOS: the patient was seen and examined on 12/25/2022   Brief hospital course: Mr. Stonge was admitted to the hospital with the working diagnosis of heart failure exacerbation.   77 yo male with the past medical history of hypertension, T2DM, coronary artery disease, hx of CVA, BPH and carotid stenosis with presented with dyspnea. Reported rapid worsening of dyspnea. Last night he had PND and was note able to cath his breath. EMS was called and he was found hypertensive with systolic in the [id number] and hypoxemic with 02 saturation 47% on room air. He received nitroglycerin SL, placed on Cpap and transported to the ED. In the emergency room he was placed on Bipap, for respiratory distress, blood pressure 132/74, HR 68, RR 19 and 02 saturation 100%, lungs with rales but no wheezing, heart with S1 and S2 present and rhythmic, abdomen with no distention, and no lower extremity edema.   Na 135, K 5.1 CL 105 bicarbonate 23, glucose 125 bun 33 cr 2,24  BNP 719 High sensitive troponin 20, 87 and 474  Lactic acid 3,2  Wbc 14.7 hgb 10,9 plt 211  Sars covid 19 negative   Chest radiograph with mild cardiomegaly with bilateral hilar vascular congestion and cephalization of the vasculature.  Sternotomy wires in place.   EKG 92 bpm, left axis deviation, interventricular conduction delay, not  qtc prolongation, sinus rhythm with q wave V1 and V2 with no significant ST segment or T wave changes.   03/22 echocardiogram with wall motion abnormalities.  Volume status has improved.  Consulted cardiology.  03/23 cardiac catheterization with no elevation in filling pressures.  Plan for cardiac nuclear stress test inpatient.   Assessment and Plan: * Acute on chronic systolic CHF (congestive heart failure) (HCC) Follow up echocardiogram with reduced LV systolic function with EF 35 to 40%, entire inferior wall, basal  infero lateral segment, and apical septal segment are akinetic. The entire anterior wall, antero lateral wall and mid distal lateral wall, anterior septum mid inferoseptal, basal infero septal segment and apex are akinetic. No significant valvular disease.   03/22 cardiac catheterization  PA 22/11 (11). PCWP 2 Cardiac output/ index+ 4.5 and 2.4  PVR 2.0 wu   Volume status has improved.  Urine output is 1,600 ml  Continue with carvedilol. On hold losartan.  Holding on spironolactone and SGLT 2 inh due to low GFR. Holding furosemide.   CAD (coronary artery disease) Sp CABG, with ischemic heart disease.  Patient with chest pain at home along with dyspnea. No active chest pain.  Echocardiogram with new wall motion abnormalities anterior wall akinetic and hypokinetic.   Plan to continue medical therapy with aspirin, clopidogrel and atorvastatin. Follow up with inpatient nuclear stress test.   Essential hypertension Continue blood pressure control with carvedilol and losartan.   CKD (chronic kidney disease) stage 4, GFR 15-29 ml/min (HCC) AKI,   Renal function with serum cr at 2,9 with K at 3,9 and serum bicarbonate at 18. Na 139 and P 5,3   Clinically euvolemic.   Follow up renal function in am, avoid hypotension and nephrotoxic medications. Holding diuretic therapy.   Type 2 diabetes mellitus (HCC) Fasting glucose is 101 mg/dl. Plan to continue glucose cover and monitoring.   History of CVA (cerebrovascular accident) Continue blood pressure control, statin and dual antiplatelet therapy.         Subjective: Patient with no chest pain  or dyspnea, no edema, no PND or orthopnea .   Physical Exam: Vitals:   12/25/22 0033 12/25/22 0500 12/25/22 0805 12/25/22 1128  BP: (!) 158/45 (!) 114/54 (!) 145/55   Pulse: 64 63  (!) 56  Resp: 19 (!) 22 20   Temp: 98 F (36.7 C) 99.3 F (37.4 C) 98.3 F (36.8 C) 98.7 F (37.1 C)  TempSrc: Oral Oral Oral Oral  SpO2: 97% 98%  100% 100%  Weight: 73.1 kg 73.1 kg    Height:       Neurology awake and alert ENT with mild pallor Cardiovascular with S1 and S2 present and rhythmic with no gallops, rubs or murmurs Respiratory with no rales or wheezing Abdomen with no distention  No lower extremity edema   Data Reviewed:    Family Communication: I spoke with patient's wife at the bedside, we talked in detail about patient's condition, plan of care and prognosis and all questions were addressed.   Disposition: Status is: Inpatient Remains inpatient appropriate because: pending inpatient stress testing   Planned Discharge Destination: Home  Author: Tawni Millers, MD 12/25/2022 1:17 PM  For on call review [email or web address].

## 2022-12-25 NOTE — Progress Notes (Signed)
Advanced Heart Failure Rounding Note   Subjective:    Echo EF 35-40% (stable EF but question of new anterior WMA)   RHC 3/22 with low filling pressures (RA 0, PCWP 2) and low output. Output improved with volume.   Denies CP or SOB.   Scr slightly improved 2.39 -> 3.29 -> 2.99  Pending Lexiscan Myoview - deferred until Monday  Objective:   Weight Range:  Vital Signs:   Temp:  [98 F (36.7 C)-99.3 F (37.4 C)] 98.3 F (36.8 C) (03/23 0805) Pulse Rate:  [51-71] 63 (03/23 0500) Resp:  [13-22] 20 (03/23 0805) BP: (114-158)/(45-83) 145/55 (03/23 0805) SpO2:  [97 %-100 %] 100 % (03/23 0805) Weight:  [73.1 kg] 73.1 kg (03/23 0500) Last BM Date : 12/20/22  Weight change: Filed Weights   12/24/22 0455 12/25/22 0033 12/25/22 0500  Weight: 73.1 kg 73.1 kg 73.1 kg    Intake/Output:   Intake/Output Summary (Last 24 hours) at 12/25/2022 1007 Last data filed at 12/25/2022 0035 Gross per 24 hour  Intake 240 ml  Output 850 ml  Net -610 ml     Physical Exam: General:  Sitting up in bed  No resp difficulty HEENT: normal + tardive dyskinesia Neck: supple. no JVD. Carotids 2+ bilat; no bruits. No lymphadenopathy or thryomegaly appreciated. Cor: PMI nondisplaced. Regular rate & rhythm. No rubs, gallops or murmurs. Lungs: clear Abdomen: soft, nontender, nondistended. No hepatosplenomegaly. No bruits or masses. Good bowel sounds. Extremities: no cyanosis, clubbing, rash, edema Neuro: alert & orientedx3, cranial nerves grossly intact. moves all 4 extremities w/o difficulty. Affect pleasant  Telemetry: sinus 60s  Labs: Basic Metabolic Panel: Recent Labs  Lab 12/22/22 0010 12/22/22 0453 12/23/22 0017 12/24/22 0028 12/24/22 1339 12/24/22 1340 12/24/22 1807 12/25/22 0026  NA 137 140 136 139 144 141  --  139  K SPECIMEN HEMOLYZED. HEMOLYSIS MAY AFFECT INTEGRITY OF RESULTS. 4.9 4.1 4.0 4.0 4.3  --  3.9  CL 108 107 104 109  --   --   --  110  CO2 15* 22 23 19*  --   --    --  18*  GLUCOSE 208* 126* 94 120*  --   --   --  101*  BUN 40* 42* 48* 61*  --   --   --  63*  CREATININE 2.36* 2.39* 2.74* 3.29*  --   --  3.00* 2.99*  CALCIUM 8.2* 8.7* 8.6* 8.5*  --   --   --  8.4*  MG  --  2.0 2.0 2.0  --   --   --   --   PHOS  --   --   --   --   --   --   --  5.3*    Liver Function Tests: Recent Labs  Lab 12/22/22 0010 12/25/22 0026  AST SPECIMEN HEMOLYZED. HEMOLYSIS MAY AFFECT INTEGRITY OF RESULTS.  --   ALT SPECIMEN HEMOLYZED. HEMOLYSIS MAY AFFECT INTEGRITY OF RESULTS.  --   ALKPHOS SPECIMEN HEMOLYZED. HEMOLYSIS MAY AFFECT INTEGRITY OF RESULTS.  --   BILITOT SPECIMEN HEMOLYZED. HEMOLYSIS MAY AFFECT INTEGRITY OF RESULTS.  --   PROT SPECIMEN HEMOLYZED. HEMOLYSIS MAY AFFECT INTEGRITY OF RESULTS.  --   ALBUMIN 3.4* 3.2*   No results for input(s): "LIPASE", "AMYLASE" in the last 168 hours. No results for input(s): "AMMONIA" in the last 168 hours.  CBC: Recent Labs  Lab 12/22/22 0010 12/22/22 0453 12/23/22 0017 12/24/22 1339 12/24/22 1340 12/24/22 1807  WBC 14.7*  10.8* 7.0  --   --  6.5  NEUTROABS 11.9*  --   --   --   --   --   HGB 10.9* 10.8* 10.2* 9.2* 9.9* 9.8*  HCT 33.8* 33.0* 31.4* 27.0* 29.0* 31.5*  MCV 98.8 95.1 94.0  --   --  98.1  PLT 211 179 152  --   --  164    Cardiac Enzymes: No results for input(s): "CKTOTAL", "CKMB", "CKMBINDEX", "TROPONINI" in the last 168 hours.  BNP: BNP (last 3 results) Recent Labs    09/24/22 1553 12/03/22 0111 12/22/22 0010  BNP 2,778.8* 719.5* 748.9*    ProBNP (last 3 results) No results for input(s): "PROBNP" in the last 8760 hours.    Other results:  Imaging: CARDIAC CATHETERIZATION  Result Date: 12/24/2022 Findings: RA = 0 (-4 with inspiration) RV = 25/2 PA = 22/1 (11) PCW = 2 Fick cardiac output/index = 4.5/2.4 Thermo CO/CI = 3.5/1.8 PVR = 2.0 FA sat = 99% PA sat = 59%,57% After 350cc fluid challenge RA = 0 RV = 27/3 PA  = 27/3 (12) PCWP = 4 Thermo CO/CI 4.1/2.2 Assessment: 1. Marked volume  depletion with low cardiac output. Mildly improved with volume challenge Glori Bickers, MD 2:56 PM  ECHOCARDIOGRAM LIMITED  Result Date: 12/24/2022    ECHOCARDIOGRAM LIMITED REPORT   Patient Name:   [name] Date of Exam: 12/24/2022 Medical Rec #:  [id number]       Height:       69.0 in Accession #:    [id number]      Weight:       161.1 lb Date of Birth:  1946/05/12       BSA:          1.885 m Patient Age:    77 years        BP:           132/55 mmHg Patient Gender: M               HR:           67 bpm. Exam Location:  Inpatient Procedure: 2D Echo, Color Doppler and Intracardiac Opacification Agent Indications:    CHF  History:        Patient has prior history of Echocardiogram examinations, most                 recent 12/23/2022. Aortic Valve Disease; Risk                 Factors:Hypertension, Diabetes and Dyslipidemia.  Sonographer:    Melissa Morford RDCS (AE, PE) Referring Phys: [id number] Masha Orbach R Neriah Brott IMPRESSIONS  1. Limited echo for LV function and wall motion  2. Left ventricular ejection fraction, by estimation, is 35 to 40%. Left ventricular ejection fraction by 2D MOD biplane is 35.9 %. The left ventricle has moderately decreased function. The left ventricle demonstrates regional wall motion abnormalities (see scoring diagram/findings for description). There is moderate left ventricular hypertrophy. There is severe akinesis of the left ventricular, mid-apical anteroseptal wall, anterior wall and apical segment. There is severe akinesis of the left ventricular, basal-mid inferior wall.  3. The aortic valve is calcified. Aortic valve regurgitation is not visualized. Aortic valve sclerosis/calcification is present, without any evidence of aortic stenosis. Comparison(s): Changes from prior study are noted. 12/23/2022: LVEF 35-40%. FINDINGS  Left Ventricle: Left ventricular ejection fraction, by estimation, is 35 to 40%. Left ventricular ejection fraction by 2D MOD biplane is 35.9 %.  The left  ventricle has moderately decreased function. The left ventricle demonstrates regional wall motion abnormalities. Severe akinesis of the left ventricular, mid-apical anteroseptal wall, anterior wall and apical segment. Severe akinesis of the left ventricular, basal-mid inferior wall. Definity contrast agent was given IV to delineate the left ventricular endocardial borders. The left ventricular internal cavity size was normal in size. There is moderate left ventricular hypertrophy. Aortic Valve: The aortic valve is calcified. Aortic valve regurgitation is not visualized. Aortic valve sclerosis/calcification is present, without any evidence of aortic stenosis. Aortic valve mean gradient measures 4.0 mmHg. Aortic valve peak gradient measures 4.2 mmHg. Aortic valve area, by VTI measures 2.18 cm. LEFT VENTRICLE PLAX 2D                        Biplane EF (MOD) LVIDd:         5.10 cm         LV Biplane EF:   Left LVIDs:         4.40 cm                          ventricular LV PW:         1.40 cm                          ejection LV IVS:        1.40 cm                          fraction by LVOT diam:     2.10 cm                          2D MOD LV SV:         47                               biplane is LV SV Index:   25                               35.9 %. LVOT Area:     3.46 cm  LV Volumes (MOD) LV vol d, MOD    157.5 ml A2C: LV vol d, MOD    141.1 ml A4C: LV vol s, MOD    83.6 ml A2C: LV vol s, MOD    105.6 ml A4C: LV SV MOD A2C:   73.9 ml LV SV MOD A4C:   141.1 ml LV SV MOD BP:    54.9 ml LEFT ATRIUM         Index LA diam:    4.00 cm 2.12 cm/m  AORTIC VALVE AV Area (Vmax):    2.71 cm AV Area (Vmean):   1.96 cm AV Area (VTI):     2.18 cm AV Vmax:           102.10 cm/s AV Vmean:          93.700 cm/s AV VTI:            0.214 m AV Peak Grad:      4.2 mmHg AV Mean Grad:      4.0 mmHg LVOT Vmax:         79.90 cm/s LVOT Vmean:  52.900 cm/s LVOT VTI:          0.135 m LVOT/AV VTI ratio: 0.63  SHUNTS Systemic VTI:  0.14 m  Systemic Diam: 2.10 cm Lyman Bishop MD Electronically signed by Lyman Bishop MD Signature Date/Time: 12/24/2022/2:16:35 PM    Final    ECHOCARDIOGRAM LIMITED  Result Date: 12/23/2022    ECHOCARDIOGRAM LIMITED REPORT   Patient Name:   [name] Date of Exam: 12/23/2022 Medical Rec #:  [id number]       Height:       69.0 in Accession #:    [id number]      Weight:       165.3 lb Date of Birth:  [birth year]       BSA:          1.906 m Patient Age:    77 years        BP:           132/55 mmHg Patient Gender: M               HR:           55 bpm. Exam Location:  Inpatient Procedure: Intracardiac Opacification Agent, Cardiac Doppler, Color Doppler and            2D Echo Indications:    Dyspnea  History:        Patient has prior history of Echocardiogram examinations, most                 recent 09/26/2022. CHF, CAD, Signs/Symptoms:Fatigue; Risk                 Factors:Hypertension, Diabetes and Hyperlipidemia.  Sonographer:    Marella Chimes Referring Phys: [id number] [place]  1. Left ventricular ejection fraction, by estimation, is 35 to 40%. The left ventricle has moderately decreased function. The left ventricle demonstrates regional wall motion abnormalities (see scoring diagram/findings for description).  2. Right ventricular systolic function was not well visualized. The right ventricular size is not well visualized.  3. The mitral valve is normal in structure. No evidence of mitral valve regurgitation. No evidence of mitral stenosis.  4. The aortic valve is calcified. FINDINGS  Left Ventricle: Left ventricular ejection fraction, by estimation, is 35 to 40%. The left ventricle has moderately decreased function. The left ventricle demonstrates regional wall motion abnormalities.  LV Wall Scoring: The entire inferior wall, basal inferolateral segment, and apical septal segment are akinetic. The entire anterior wall, antero-lateral wall, mid and distal lateral wall, anterior septum, mid  inferoseptal segment, basal inferoseptal segment, and apex are hypokinetic. Right Ventricle: The right ventricular size is not well visualized. Right vetricular wall thickness was not assessed. Right ventricular systolic function was not well visualized. Left Atrium: Left atrial size was not well visualized. Right Atrium: Right atrial size was not well visualized. Pericardium: Presence of epicardial fat layer. Mitral Valve: The mitral valve is normal in structure. No evidence of mitral valve stenosis. Aortic Valve: The aortic valve is calcified. Aorta: Aortic root could not be assessed. IAS/Shunts: The interatrial septum was not assessed. Godfrey Pick Tobb DO Electronically signed by Berniece Salines DO Signature Date/Time: 12/23/2022/1:46:02 PM    Final      Medications:     Scheduled Medications:  aspirin  81 mg Oral Daily   atorvastatin  20 mg Oral QHS   carvedilol  3.125 mg Oral BID WC   clopidogrel  75 mg Oral Daily   enoxaparin (LOVENOX) injection  30  mg Subcutaneous Q24H   famotidine  20 mg Oral QODAY   finasteride  5 mg Oral Daily   FLUoxetine  40 mg Oral Daily   gabapentin  300 mg Oral BID   insulin aspart  0-6 Units Subcutaneous Q4H   mirtazapine  45 mg Oral QHS   pantoprazole  40 mg Oral QAC breakfast   regadenoson  0.4 mg Intravenous Once   rivastigmine  9.5 mg Transdermal Daily   sodium chloride flush  3 mL Intravenous Q12H   sodium chloride flush  3 mL Intravenous Q12H   sodium chloride flush  3 mL Intravenous Q12H    Infusions:  sodium chloride      PRN Medications: sodium chloride, acetaminophen, sodium chloride flush   Assessment/Plan:   1. Acute on Chronic Systolic Heart Failure  - Echo 2013 EF 55-60%, RV normal   -  Echo 12/23 EF 30-35% w/ normal RV -  Admitted w/ acute pulmonary edema, NYHA Class IIIb - Echo EF 35-40% (stable EF but question of new anterior WMA)  - RHC 3/22 with low filling pressures (RA 0, PCWP 2) and low output. Output improved with volume.  -  Continue to hold diuretics - Patient admitted with acute HF but RHC with very low filling pressures. ? NSTEMI or HTN crisis Hs trop 80 -> 474 -> 365  - Has h/o CAD w/ remote CABG but no recent ischemic CP.  No cath with A/CKD. Plan [place] today - GDMT limited by CKD  - Continue Coreg 6.25 mg bid  - Can use hydral/Imdur for afterload reduction    2. Hypertensive Urgency  - SBPs 200s on admit - now 140-150s - GDMT per above  - Will not push anti-HTN regimen too hard until kidneys improve - Consider evaluation for RAS   3. CAD w/ Elevated HS Trop  - s/p CABG in 2012 (LIMA-LAD, SVG-OM, SVG-PDA) - NST 06/2014   Low risk stress nuclear study with small inferobasal scar, EF 56%  - Hs trop 87>>474 - denies ischemic CP - w/ drop in EF and anterior wall AK, will plan NST tomorrow  - cont ASA, Plavix, ? blocker + statin    4. AKI on CKD IIIb - suspect cardiorenal/overdiuresis - B/l SCr ~2.4 - SCr up from 2.4 on admission>>2.74>>3.29 -> 2.99 today - Hold diuretics today - Follow BMET  - Will do renal u/s   5. Type 2DM - per IM     Length of Stay: 3   Glori Bickers MD 12/25/2022, 10:07 AM  Advanced Heart Failure Team Pager [phone] (M-F; 7a - 4p)  Please contact [facility] for night-coverage after hours (4p -7a ) and weekends on [email or web address]

## 2022-12-26 ENCOUNTER — Encounter (HOSPITAL_COMMUNITY): Payer: No Typology Code available for payment source

## 2022-12-26 DIAGNOSIS — N184 Chronic kidney disease, stage 4 (severe): Secondary | ICD-10-CM | POA: Diagnosis not present

## 2022-12-26 DIAGNOSIS — I2510 Atherosclerotic heart disease of native coronary artery without angina pectoris: Secondary | ICD-10-CM | POA: Diagnosis not present

## 2022-12-26 DIAGNOSIS — E785 Hyperlipidemia, unspecified: Secondary | ICD-10-CM

## 2022-12-26 DIAGNOSIS — E1169 Type 2 diabetes mellitus with other specified complication: Secondary | ICD-10-CM | POA: Diagnosis not present

## 2022-12-26 DIAGNOSIS — I5023 Acute on chronic systolic (congestive) heart failure: Secondary | ICD-10-CM | POA: Diagnosis not present

## 2022-12-26 LAB — BASIC METABOLIC PANEL
Anion gap: 8 (ref 5–15)
BUN: 56 mg/dL — ABNORMAL HIGH (ref 8–23)
CO2: 18 mmol/L — ABNORMAL LOW (ref 22–32)
Calcium: 8.6 mg/dL — ABNORMAL LOW (ref 8.9–10.3)
Chloride: 113 mmol/L — ABNORMAL HIGH (ref 98–111)
Creatinine, Ser: 2.4 mg/dL — ABNORMAL HIGH (ref 0.61–1.24)
GFR, Estimated: 27 mL/min — ABNORMAL LOW (ref >60)
Glucose, Bld: 101 mg/dL — ABNORMAL HIGH (ref 70–99)
Potassium: 3.9 mmol/L (ref 3.5–5.1)
Sodium: 139 mmol/L (ref 135–145)

## 2022-12-26 LAB — GLUCOSE, CAPILLARY
Glucose-Capillary: 105 mg/dL — ABNORMAL HIGH (ref 70–99)
Glucose-Capillary: 110 mg/dL — ABNORMAL HIGH (ref 70–99)
Glucose-Capillary: 123 mg/dL — ABNORMAL HIGH (ref 70–99)

## 2022-12-26 MED ORDER — HYDRALAZINE HCL 25 MG PO TABS
12.5000 mg | ORAL_TABLET | Freq: Three times a day (TID) | ORAL | Status: DC
  Administered 2022-12-26 – 2022-12-27 (×3): 12.5 mg via ORAL
  Filled 2022-12-26 (×3): qty 1

## 2022-12-26 MED ORDER — ISOSORBIDE MONONITRATE ER 30 MG PO TB24
15.0000 mg | ORAL_TABLET | Freq: Every day | ORAL | Status: DC
  Administered 2022-12-26 – 2022-12-27 (×2): 15 mg via ORAL
  Filled 2022-12-26 (×2): qty 1

## 2022-12-26 NOTE — Progress Notes (Signed)
Progress Note   Patient: [name] [id number] DOB: [birth year] DOA: 12/21/2022     4 DOS: the patient was seen and examined on 12/26/2022   Brief hospital course: Mr. Dias was admitted to the hospital with the working diagnosis of heart failure exacerbation.   77 yo male with the past medical history of hypertension, T2DM, coronary artery disease, hx of CVA, BPH and carotid stenosis with presented with dyspnea. Reported rapid worsening of dyspnea. Last night he had PND and was note able to cath his breath. EMS was called and he was found hypertensive with systolic in the [id number] and hypoxemic with 02 saturation 47% on room air. He received nitroglycerin SL, placed on Cpap and transported to the ED. In the emergency room he was placed on Bipap, for respiratory distress, blood pressure 132/74, HR 68, RR 19 and 02 saturation 100%, lungs with rales but no wheezing, heart with S1 and S2 present and rhythmic, abdomen with no distention, and no lower extremity edema.   Na 135, K 5.1 CL 105 bicarbonate 23, glucose 125 bun 33 cr 2,24  BNP 719 High sensitive troponin 20, 87 and 474  Lactic acid 3,2  Wbc 14.7 hgb 10,9 plt 211  Sars covid 19 negative   Chest radiograph with mild cardiomegaly with bilateral hilar vascular congestion and cephalization of the vasculature.  Sternotomy wires in place.   EKG 92 bpm, left axis deviation, interventricular conduction delay, not  qtc prolongation, sinus rhythm with q wave V1 and V2 with no significant ST segment or T wave changes.   03/22 echocardiogram with wall motion abnormalities.  Volume status has improved.  Consulted cardiology.  03/23 cardiac catheterization with no elevation in filling pressures.  Plan for cardiac nuclear stress test inpatient.  03/14 clinically stable, pending stress test possible tomorrow.   Assessment and Plan: * Acute on chronic systolic CHF (congestive heart failure) (HCC) Follow up echocardiogram with reduced LV  systolic function with EF 35 to 40%, entire inferior wall, basal infero lateral segment, and apical septal segment are akinetic. The entire anterior wall, antero lateral wall and mid distal lateral wall, anterior septum mid inferoseptal, basal infero septal segment and apex are akinetic. No significant valvular disease.   03/22 cardiac catheterization  PA 22/11 (11). PCWP 2 Cardiac output/ index+ 4.5 and 2.4  PVR 2.0 wu   Volume status has improved.  Urine output is 1,300 ml  Continue with carvedilol. To consider resuming losartan in the next 24 hrs if renal function stable.  Holding on spironolactone and SGLT 2 inh due to low GFR. Holding furosemide.   CAD (coronary artery disease) Sp CABG, with ischemic heart disease.  Patient with chest pain at home along with dyspnea. No active chest pain during his hospirtalization.  Echocardiogram with new wall motion abnormalities anterior wall akinetic and hypokinetic.   Plan to continue medical therapy with aspirin, clopidogrel and atorvastatin. Follow up with inpatient nuclear stress test.   Essential hypertension Continue blood pressure control with carvedilol. Now that renal function is improving, possible resumption of ARB, patient has been on lisinopril at home, 20 mg daily.   CKD (chronic kidney disease) stage 4, GFR 15-29 ml/min (HCC) AKI,   Clinically euvolemic.  Renal function with serum cr at 2,40 with K at 3,9 and serum bicarbonate at 18. Na 139 P 5,3   Follow up renal function and electrolytes in am.   Type 2 diabetes mellitus with hyperlipidemia (HCC) Glucose well controlled, plan to discontinue insulin and  routine capillary glucose monitoring.  Patient is tolerating po well.   Continue with statin therapy.   History of CVA (cerebrovascular accident) Continue blood pressure control, statin and dual antiplatelet therapy.         Subjective: Patient is feeling better, no chest pain, no dyspnea, no PND and no  orthopnea.   Physical Exam: Vitals:   12/26/22 0051 12/26/22 0400 12/26/22 0554 12/26/22 0828  BP:  (!) 147/63  (!) 161/63  Pulse:    60  Resp:  18  18  Temp: 97.6 F (36.4 C)  98.1 F (36.7 C) 98.3 F (36.8 C)  TempSrc: Oral  Oral Oral  SpO2:   97% 98%  Weight:      Height:       Neurology awake and alert ENT with mild pallor Cardiovascular with S1 and S2 present and rhythmic with no gallops, rubs or murmurs No JVD No lower extremity edema Respiratory with no rales or wheezing, mild prolonged expiratory phase.  Abdomen with no distention  Data Reviewed:    Family Communication: no family at the bedside   Disposition: Status is: Inpatient Remains inpatient appropriate because: heart failure, pending stress test.   Planned Discharge Destination: Home     Author: Tawni Millers, MD 12/26/2022 10:44 AM  For on call review [email or web address].

## 2022-12-26 NOTE — Progress Notes (Signed)
Advanced Heart Failure Rounding Note   Subjective:    Echo EF 35-40% (stable EF but question of new anterior WMA)   RHC 3/22 with low filling pressures (RA 0, PCWP 2) and low output. Output improved with volume.   Renal u/s shows atrophic R kidney/ BPs remain high.   Scr improving  2.39 -> 3.29 -> 2.99 -> 2.40  Pending Lexiscan Myoview - deferred until tomorrow   Denies CP or SOB.   Objective:   Weight Range:  Vital Signs:   Temp:  [97.6 F (36.4 C)-98.9 F (37.2 C)] 98.3 F (36.8 C) (03/24 0828) Pulse Rate:  [52-60] 60 (03/24 0828) Resp:  [12-19] 18 (03/24 0828) BP: (145-163)/(57-89) 161/63 (03/24 0828) SpO2:  [97 %-98 %] 98 % (03/24 0828) Last BM Date : 12/20/22  Weight change: Filed Weights   12/24/22 0455 12/25/22 0033 12/25/22 0500  Weight: 73.1 kg 73.1 kg 73.1 kg    Intake/Output:   Intake/Output Summary (Last 24 hours) at 12/26/2022 1150 Last data filed at 12/26/2022 0555 Gross per 24 hour  Intake --  Output 800 ml  Net -800 ml      Physical Exam: General:  Sitting up in bed  No resp difficulty HEENT: normal + tardive dyskinesia Neck: supple. no JVD. Carotids 2+ bilat; no bruits. No lymphadenopathy or thryomegaly appreciated. Cor: PMI nondisplaced. Regular rate & rhythm. No rubs, gallops or murmurs. Lungs: clear Abdomen: soft, nontender, nondistended. No hepatosplenomegaly. No bruits or masses. Good bowel sounds. Extremities: no cyanosis, clubbing, rash, edema Neuro: alert & orientedx3, cranial nerves grossly intact. moves all 4 extremities w/o difficulty. Affect pleasant   Telemetry: sinus 50-60s  Labs: Basic Metabolic Panel: Recent Labs  Lab 12/22/22 0453 12/23/22 0017 12/24/22 0028 12/24/22 1339 12/24/22 1340 12/24/22 1807 12/25/22 0026 12/26/22 0050  NA 140 136 139 144 141  --  139 139  K 4.9 4.1 4.0 4.0 4.3  --  3.9 3.9  CL 107 104 109  --   --   --  110 113*  CO2 22 23 19*  --   --   --  18* 18*  GLUCOSE 126* 94 120*  --    --   --  101* 101*  BUN 42* 48* 61*  --   --   --  63* 56*  CREATININE 2.39* 2.74* 3.29*  --   --  3.00* 2.99* 2.40*  CALCIUM 8.7* 8.6* 8.5*  --   --   --  8.4* 8.6*  MG 2.0 2.0 2.0  --   --   --   --   --   PHOS  --   --   --   --   --   --  5.3*  --      Liver Function Tests: Recent Labs  Lab 12/22/22 0010 12/25/22 0026  AST SPECIMEN HEMOLYZED. HEMOLYSIS MAY AFFECT INTEGRITY OF RESULTS.  --   ALT SPECIMEN HEMOLYZED. HEMOLYSIS MAY AFFECT INTEGRITY OF RESULTS.  --   ALKPHOS SPECIMEN HEMOLYZED. HEMOLYSIS MAY AFFECT INTEGRITY OF RESULTS.  --   BILITOT SPECIMEN HEMOLYZED. HEMOLYSIS MAY AFFECT INTEGRITY OF RESULTS.  --   PROT SPECIMEN HEMOLYZED. HEMOLYSIS MAY AFFECT INTEGRITY OF RESULTS.  --   ALBUMIN 3.4* 3.2*    No results for input(s): "LIPASE", "AMYLASE" in the last 168 hours. No results for input(s): "AMMONIA" in the last 168 hours.  CBC: Recent Labs  Lab 12/22/22 0010 12/22/22 0453 12/23/22 0017 12/24/22 1339 12/24/22 1340 12/24/22 1807  WBC 14.7* 10.8* 7.0  --   --  6.5  NEUTROABS 11.9*  --   --   --   --   --   HGB 10.9* 10.8* 10.2* 9.2* 9.9* 9.8*  HCT 33.8* 33.0* 31.4* 27.0* 29.0* 31.5*  MCV 98.8 95.1 94.0  --   --  98.1  PLT 211 179 152  --   --  164     Cardiac Enzymes: No results for input(s): "CKTOTAL", "CKMB", "CKMBINDEX", "TROPONINI" in the last 168 hours.  BNP: BNP (last 3 results) Recent Labs    09/24/22 1553 12/03/22 0111 12/22/22 0010  BNP 2,778.8* 719.5* 748.9*     ProBNP (last 3 results) No results for input(s): "PROBNP" in the last 8760 hours.    Other results:  Imaging: US RENAL  Result Date: 12/25/2022 CLINICAL DATA:  Acute kidney injury. EXAM: RENAL / URINARY TRACT ULTRASOUND COMPLETE COMPARISON:  CT scan of September 23, 2022. FINDINGS: Right Kidney: Renal measurements: 7.6 x 4.3 x 3.7 cm = volume: 63 mL. Echogenicity of renal parenchyma within normal limits. 5.4 cm simple cyst is seen in upper pole. No mass or hydronephrosis  visualized. Left Kidney: Renal measurements: 11.3 x 6.0 x 4.6 cm = volume: 135 mL. Echogenicity within normal limits. No mass or hydronephrosis visualized. Bladder: Appears normal for degree of bladder distention. Other: None. IMPRESSION: Severe right renal atrophy is noted. No hydronephrosis or renal obstruction is noted. Electronically Signed   By: Marijo Conception M.D.   On: 12/25/2022 14:56   CARDIAC CATHETERIZATION  Result Date: 12/24/2022 Findings: RA = 0 (-4 with inspiration) RV = 25/2 PA = 22/1 (11) PCW = 2 Fick cardiac output/index = 4.5/2.4 Thermo CO/CI = 3.5/1.8 PVR = 2.0 FA sat = 99% PA sat = 59%,57% After 350cc fluid challenge RA = 0 RV = 27/3 PA  = 27/3 (12) PCWP = 4 Thermo CO/CI 4.1/2.2 Assessment: 1. Marked volume depletion with low cardiac output. Mildly improved with volume challenge Glori Bickers, MD 2:56 PM    Medications:     Scheduled Medications:  aspirin  81 mg Oral Daily   atorvastatin  20 mg Oral QHS   carvedilol  3.125 mg Oral BID WC   clopidogrel  75 mg Oral Daily   enoxaparin (LOVENOX) injection  30 mg Subcutaneous Q24H   famotidine  20 mg Oral QODAY   finasteride  5 mg Oral Daily   FLUoxetine  40 mg Oral Daily   gabapentin  300 mg Oral BID   mirtazapine  45 mg Oral QHS   pantoprazole  40 mg Oral QAC breakfast   regadenoson  0.4 mg Intravenous Once   rivastigmine  9.5 mg Transdermal Daily   sodium chloride flush  3 mL Intravenous Q12H   sodium chloride flush  3 mL Intravenous Q12H   sodium chloride flush  3 mL Intravenous Q12H    Infusions:  sodium chloride      PRN Medications: sodium chloride, acetaminophen, sodium chloride flush   Assessment/Plan:   1. Acute on Chronic Systolic Heart Failure  - Echo 2013 EF 55-60%, RV normal   -  Echo 12/23 EF 30-35% w/ normal RV -  Admitted w/ acute pulmonary edema, NYHA Class IIIb - Echo EF 35-40% (stable EF but question of new anterior WMA)  - RHC 3/22 with low filling pressures (RA 0, PCWP 2) and low  output. Output improved with volume.  - Continue to hold diuretics - Patient admitted with acute HF but RHC  with very low filling pressures. Hs trop 80 -> 474 -> 365 - Suspect HTN crisis in setting or probable RAS - Has h/o CAD w/ remote CABG but no recent ischemic CP.  No cath with A/CKD. Plan Lexiscan Myoview today - GDMT limited by CKD  - HR low decreased carvedilol to 3.125 mg bid  - Can use hydral/Imdur for afterload reduction. With AK apn top of CKD IV would keep SBP over 130 for now    2. Hypertensive Urgency  - SBPs 200s on admit - now 140-150s - GDMT per above  - Will not push anti-HTN regimen too hard until kidneys improve - Renal u/s very suggestive of RAS given atrophic R kidney - Plan renovascular u/s and will likely need VVS to see (has h/o of AAA with stent with post-op leak and previous carotid stent)   3. CAD w/ Elevated HS Trop  - s/p CABG in 2012 (LIMA-LAD, SVG-OM, SVG-PDA) - NST 06/2014   Low risk stress nuclear study with small inferobasal scar, EF 56%  - Hs trop 87>>474 - denies ischemic CP - w/ drop in EF and anterior wall AK, will plan NST tomorrow  - cont ASA, Plavix, ? blocker + statin    4. AKI on CKD IIIb - suspect cardiorenal/overdiuresis - B/l SCr ~2.4 - SCr up from 2.4 on admission>>2.74>>3.29 -> 2.99 today - Hold diuretics today - Follow BMET  - W/u for RAS as above   5. Type 2DM - per IM   6. PAD - has h/o of AAA with stent with post-op leak and previous carotid stent at [facility]    Length of Stay: 4   Glori Bickers MD 12/26/2022, 11:50 AM  Advanced Heart Failure Team Pager [phone] (M-F; [place])  Please contact [facility] for night-coverage after hours (4p -7a ) and weekends on [email or web address]

## 2022-12-27 ENCOUNTER — Inpatient Hospital Stay (HOSPITAL_COMMUNITY): Payer: No Typology Code available for payment source

## 2022-12-27 ENCOUNTER — Encounter (HOSPITAL_COMMUNITY): Payer: Self-pay | Admitting: Internal Medicine

## 2022-12-27 DIAGNOSIS — E1169 Type 2 diabetes mellitus with other specified complication: Secondary | ICD-10-CM | POA: Diagnosis not present

## 2022-12-27 DIAGNOSIS — N184 Chronic kidney disease, stage 4 (severe): Secondary | ICD-10-CM | POA: Diagnosis not present

## 2022-12-27 DIAGNOSIS — I2510 Atherosclerotic heart disease of native coronary artery without angina pectoris: Secondary | ICD-10-CM | POA: Diagnosis not present

## 2022-12-27 DIAGNOSIS — I5023 Acute on chronic systolic (congestive) heart failure: Secondary | ICD-10-CM | POA: Diagnosis not present

## 2022-12-27 DIAGNOSIS — N261 Atrophy of kidney (terminal): Secondary | ICD-10-CM | POA: Diagnosis not present

## 2022-12-27 LAB — NM MYOCAR MULTI W/SPECT W/WALL MOTION / EF
Estimated workload: 1
Exercise duration (min): 0 min
Exercise duration (sec): 0 s
MPHR: 142 {beats}/min
Peak HR: 74 {beats}/min
Percent HR: 52 %
RPE: 0
Rest HR: 53 {beats}/min
ST Depression (mm): 0 mm

## 2022-12-27 LAB — BASIC METABOLIC PANEL
Anion gap: 10 (ref 5–15)
BUN: 51 mg/dL — ABNORMAL HIGH (ref 8–23)
CO2: 18 mmol/L — ABNORMAL LOW (ref 22–32)
Calcium: 8.8 mg/dL — ABNORMAL LOW (ref 8.9–10.3)
Chloride: 111 mmol/L (ref 98–111)
Creatinine, Ser: 2.19 mg/dL — ABNORMAL HIGH (ref 0.61–1.24)
GFR, Estimated: 30 mL/min — ABNORMAL LOW (ref >60)
Glucose, Bld: 104 mg/dL — ABNORMAL HIGH (ref 70–99)
Potassium: 4.1 mmol/L (ref 3.5–5.1)
Sodium: 139 mmol/L (ref 135–145)

## 2022-12-27 MED ORDER — TECHNETIUM TC 99M TETROFOSMIN IV KIT
10.7000 | PACK | Freq: Once | INTRAVENOUS | Status: AC | PRN
  Administered 2022-12-27: 10.7 via INTRAVENOUS

## 2022-12-27 MED ORDER — TECHNETIUM TC 99M TETROFOSMIN IV KIT
31.4000 | PACK | Freq: Once | INTRAVENOUS | Status: AC | PRN
  Administered 2022-12-27: 31.4 via INTRAVENOUS

## 2022-12-27 MED ORDER — ISOSORBIDE MONONITRATE ER 30 MG PO TB24
30.0000 mg | ORAL_TABLET | Freq: Every day | ORAL | Status: DC
  Administered 2022-12-28 – 2022-12-29 (×2): 30 mg via ORAL
  Filled 2022-12-27 (×2): qty 1

## 2022-12-27 MED ORDER — HYDRALAZINE HCL 25 MG PO TABS
25.0000 mg | ORAL_TABLET | Freq: Three times a day (TID) | ORAL | Status: DC
  Administered 2022-12-27 – 2022-12-29 (×5): 25 mg via ORAL
  Filled 2022-12-27 (×5): qty 1

## 2022-12-27 MED ORDER — REGADENOSON 0.4 MG/5ML IV SOLN
INTRAVENOUS | Status: AC
  Administered 2022-12-27: 0.4 mg via INTRAVENOUS
  Filled 2022-12-27: qty 5

## 2022-12-27 NOTE — Progress Notes (Signed)
Advanced Heart Failure Rounding Note   Subjective:    Echo EF 35-40% (stable EF but question of new anterior WMA)   RHC 3/22 with low filling pressures (RA 0, PCWP 2) and low output. Output improved with volume.   Renal u/s shows atrophic R kidney no hydronephrosis    No complaints.   Objective:   Weight Range:  Vital Signs:   Temp:  [97.8 F (36.6 C)-98.7 F (37.1 C)] 97.8 F (36.6 C) (03/25 0751) Pulse Rate:  [54-71] 71 (03/25 0751) Resp:  [14-20] 14 (03/25 0751) BP: (136-160)/(58-68) 160/65 (03/25 0751) SpO2:  [96 %-99 %] 99 % (03/25 0751) Weight:  [76.2 kg] 76.2 kg (03/25 0430) Last BM Date : 12/26/22  Weight change: Filed Weights   12/25/22 0033 12/25/22 0500 12/27/22 0430  Weight: 73.1 kg 73.1 kg 76.2 kg    Intake/Output:   Intake/Output Summary (Last 24 hours) at 12/27/2022 0829 Last data filed at 12/27/2022 0436 Gross per 24 hour  Intake 1640 ml  Output 1450 ml  Net 190 ml     Physical Exam: General:   No resp difficulty HEENT: normal Neck: supple. no JVD. Carotids 2+ bilat; no bruits. No lymphadenopathy or thryomegaly appreciated. Cor: PMI nondisplaced. Regular rate & rhythm. No rubs, gallops or murmurs. Lungs: clear Abdomen: soft, nontender, nondistended. No hepatosplenomegaly. No bruits or masses. Good bowel sounds. Extremities: no cyanosis, clubbing, rash, edema Neuro: alert & orientedx3, cranial nerves grossly intact. moves all 4 extremities w/o difficulty. Affect pleasant  Telemetry: SR-SB 50-60s   Labs: Basic Metabolic Panel: Recent Labs  Lab 12/22/22 0453 12/23/22 0017 12/24/22 0028 12/24/22 1339 12/24/22 1340 12/24/22 1807 12/25/22 0026 12/26/22 0050 12/27/22 0041  NA 140 136 139 144 141  --  139 139 139  K 4.9 4.1 4.0 4.0 4.3  --  3.9 3.9 4.1  CL 107 104 109  --   --   --  110 113* 111  CO2 22 23 19*  --   --   --  18* 18* 18*  GLUCOSE 126* 94 120*  --   --   --  101* 101* 104*  BUN 42* 48* 61*  --   --   --  63* 56* 51*   CREATININE 2.39* 2.74* 3.29*  --   --  3.00* 2.99* 2.40* 2.19*  CALCIUM 8.7* 8.6* 8.5*  --   --   --  8.4* 8.6* 8.8*  MG 2.0 2.0 2.0  --   --   --   --   --   --   PHOS  --   --   --   --   --   --  5.3*  --   --     Liver Function Tests: Recent Labs  Lab 12/22/22 0010 12/25/22 0026  AST SPECIMEN HEMOLYZED. HEMOLYSIS MAY AFFECT INTEGRITY OF RESULTS.  --   ALT SPECIMEN HEMOLYZED. HEMOLYSIS MAY AFFECT INTEGRITY OF RESULTS.  --   ALKPHOS SPECIMEN HEMOLYZED. HEMOLYSIS MAY AFFECT INTEGRITY OF RESULTS.  --   BILITOT SPECIMEN HEMOLYZED. HEMOLYSIS MAY AFFECT INTEGRITY OF RESULTS.  --   PROT SPECIMEN HEMOLYZED. HEMOLYSIS MAY AFFECT INTEGRITY OF RESULTS.  --   ALBUMIN 3.4* 3.2*   No results for input(s): "LIPASE", "AMYLASE" in the last 168 hours. No results for input(s): "AMMONIA" in the last 168 hours.  CBC: Recent Labs  Lab 12/22/22 0010 12/22/22 0453 12/23/22 0017 12/24/22 1339 12/24/22 1340 12/24/22 1807  WBC 14.7* 10.8* 7.0  --   --  6.5  NEUTROABS 11.9*  --   --   --   --   --   HGB 10.9* 10.8* 10.2* 9.2* 9.9* 9.8*  HCT 33.8* 33.0* 31.4* 27.0* 29.0* 31.5*  MCV 98.8 95.1 94.0  --   --  98.1  PLT 211 179 152  --   --  164    Cardiac Enzymes: No results for input(s): "CKTOTAL", "CKMB", "CKMBINDEX", "TROPONINI" in the last 168 hours.  BNP: BNP (last 3 results) Recent Labs    09/24/22 1553 12/03/22 0111 12/22/22 0010  BNP 2,778.8* 719.5* 748.9*    ProBNP (last 3 results) No results for input(s): "PROBNP" in the last 8760 hours.    Other results:  Imaging: US RENAL  Result Date: 12/25/2022 CLINICAL DATA:  Acute kidney injury. EXAM: RENAL / URINARY TRACT ULTRASOUND COMPLETE COMPARISON:  CT scan of September 23, 2022. FINDINGS: Right Kidney: Renal measurements: 7.6 x 4.3 x 3.7 cm = volume: 63 mL. Echogenicity of renal parenchyma within normal limits. 5.4 cm simple cyst is seen in upper pole. No mass or hydronephrosis visualized. Left Kidney: Renal measurements: 11.3  x 6.0 x 4.6 cm = volume: 135 mL. Echogenicity within normal limits. No mass or hydronephrosis visualized. Bladder: Appears normal for degree of bladder distention. Other: None. IMPRESSION: Severe right renal atrophy is noted. No hydronephrosis or renal obstruction is noted. Electronically Signed   By: Marijo Conception M.D.   On: 12/25/2022 14:56     Medications:     Scheduled Medications:  aspirin  81 mg Oral Daily   atorvastatin  20 mg Oral QHS   carvedilol  3.125 mg Oral BID WC   clopidogrel  75 mg Oral Daily   enoxaparin (LOVENOX) injection  30 mg Subcutaneous Q24H   famotidine  20 mg Oral QODAY   finasteride  5 mg Oral Daily   FLUoxetine  40 mg Oral Daily   gabapentin  300 mg Oral BID   hydrALAZINE  12.5 mg Oral Q8H   isosorbide mononitrate  15 mg Oral Daily   mirtazapine  45 mg Oral QHS   pantoprazole  40 mg Oral QAC breakfast   regadenoson  0.4 mg Intravenous Once   rivastigmine  9.5 mg Transdermal Daily   sodium chloride flush  3 mL Intravenous Q12H   sodium chloride flush  3 mL Intravenous Q12H   sodium chloride flush  3 mL Intravenous Q12H    Infusions:  sodium chloride      PRN Medications: sodium chloride, acetaminophen, sodium chloride flush   Assessment/Plan:   1. Acute on Chronic Systolic Heart Failure  - Echo 2013 EF 55-60%, RV normal   -  Echo 12/23 EF 30-35% w/ normal RV -  Admitted w/ acute pulmonary edema, NYHA Class IIIb  - RHC with very low filling pressures. Hs trop 80 -> 474 -> 365 - Echo EF 35-40% (stable EF but question of new anterior WMA)  -  Suspect HTN crisis in setting or probable RAS - Has h/o CAD w/ remote CABG but no recent ischemic CP.  No cath with A/CKD. Plan Lexiscan Myoview today - Volume status stable.  - GDMT limited by CKD  - carvedilol to 3.125 mg bid  - Could use hydral/Imdur for afterload reduction. With AKI on CKD IV would keep SBP over 130 for now    2. Hypertensive Urgency  - SBPs 200s on admit - now 140-150s - GDMT per  above  - Will not push anti-HTN regimen too hard  until kidneys improve - Renal u/s of RAS- severe R renal atrophy no hydronephrosis.    3. CAD w/ Elevated HS Trop  - s/p CABG in 2012 (LIMA-LAD, SVG-OM, SVG-PDA) - NST 06/2014   Low risk stress nuclear study with small inferobasal scar, EF 56%  - Hs trop 87>>474 - - w/ drop in EF and anterior wall AK, sent for nuclear stress test today.  - cont ASA, Plavix, ? blocker + statin    4. AKI on CKD IIIb - suspect cardiorenal/overdiuresis - B/l SCr ~2.4 - SCr up from 2.4 on admission>>2.74>>3.29 -> 2.99>2.2 today - Renal US-Severe R renal atrophy. No hydronephrosis.  - Vascular Renal Artery Duplex later this morning.    5. Type 2DM - per IM   6. PAD - has h/o of AAA with stent with post-op leak and previous carotid stent at [facility]    Length of Stay: [place] NP-C  12/27/2022, 8:29 AM  Advanced Heart Failure Team Pager [phone] (M-F; 7a - 4p)  Please contact [facility] for night-coverage after hours (4p -7a ) and weekends on [email or web address]

## 2022-12-27 NOTE — Progress Notes (Signed)
TR[facility] night cross cover note:   I was notified by RN of the patient's blood pressure of 113/48, down slightly from systolic pressures in the 130s to 140s earlier today.  Other vital signs appear stable, with heart rates in the 60s, afebrile, and oxygen saturation of 100% on room air.  Will hold his evening scheduled hydralazine, and continue to closely monitor ensuing blood pressure trend.   Babs Bertin, DO Hospitalist

## 2022-12-27 NOTE — Progress Notes (Signed)
  Pt had no complications with stress portion of study.  Results to follow.  Cecilie Kicks, FNP-C At [place]  [id number] or after 5pm and on weekends call [phone] 12/27/2022.now

## 2022-12-27 NOTE — Progress Notes (Signed)
Progress Note   Patient: LESLEE LEHR [id number] DOB: [birth year] DOA: 12/21/2022     5 DOS: the patient was seen and examined on 12/27/2022   Brief hospital course: Mr. Ketelsen was admitted to the hospital with the working diagnosis of heart failure exacerbation.   77 yo male with the past medical history of hypertension, T2DM, coronary artery disease, hx of CVA, BPH and carotid stenosis with presented with dyspnea. Reported rapid worsening of dyspnea. Last night he had PND and was note able to cath his breath. EMS was called and he was found hypertensive with systolic in the [id number] and hypoxemic with 02 saturation 47% on room air. He received nitroglycerin SL, placed on Cpap and transported to the ED. In the emergency room he was placed on Bipap, for respiratory distress, blood pressure 132/74, HR 68, RR 19 and 02 saturation 100%, lungs with rales but no wheezing, heart with S1 and S2 present and rhythmic, abdomen with no distention, and no lower extremity edema.   Na 135, K 5.1 CL 105 bicarbonate 23, glucose 125 bun 33 cr 2,24  BNP 719 High sensitive troponin 20, 87 and 474  Lactic acid 3,2  Wbc 14.7 hgb 10,9 plt 211  Sars covid 19 negative   Chest radiograph with mild cardiomegaly with bilateral hilar vascular congestion and cephalization of the vasculature.  Sternotomy wires in place.   EKG 92 bpm, left axis deviation, interventricular conduction delay, not  qtc prolongation, sinus rhythm with q wave V1 and V2 with no significant ST segment or T wave changes.   03/22 echocardiogram with wall motion abnormalities.  Volume status has improved.  Consulted cardiology.  03/23 cardiac catheterization with no elevation in filling pressures.  Plan for cardiac nuclear stress test inpatient.  03/24 clinically stable, pending stress test possible tomorrow.  03/25 stress test and renal artery doppler US part of the workup.   Assessment and Plan: * Acute on chronic systolic CHF (congestive  heart failure) (HCC) Follow up echocardiogram with reduced LV systolic function with EF 35 to 40%, entire inferior wall, basal infero lateral segment, and apical septal segment are akinetic. The entire anterior wall, antero lateral wall and mid distal lateral wall, anterior septum mid inferoseptal, basal infero septal segment and apex are akinetic. No significant valvular disease.   03/22 cardiac catheterization  PA 22/11 (11). PCWP 2 Cardiac output/ index+ 4.5 and 2.4  PVR 2.0 wu   Volume status has improved.  Urine output is 1,450 ml  Started on hydralazine and isosorbide for after load reduction.  Continue carvedilol. Holding diuretic therapy until renal function more stable.   CAD (coronary artery disease) Sp CABG, with ischemic heart disease.  Patient with chest pain at home along with dyspnea. No active chest pain during his hospirtalization.  Echocardiogram with new wall motion abnormalities anterior wall akinetic and hypokinetic.   Plan to continue medical therapy with aspirin, clopidogrel and atorvastatin. Follow up with result from stress test.   Essential hypertension Continue blood pressure control with carvedilol, hydralazine and isosorbide.   Renal artery doppler US: Right patent renal artery with no significant evidence of stenosis at the proximal, mid and distal levels.  Left patent renal artery with no evidence of significant stenosis at the proximal, mid and distal levels.   Consider low dose ARB trial. Patient has been on lisinopril at home.    CKD (chronic kidney disease) stage 4, GFR 15-29 ml/min (HCC) AKI,   Continue to improve renal function with serum cr  at 2,19 with K at 4,1 and serum bicarbonate at 18,  Na is 139 and BUN 51.   Continue to hold on diuretic therapy. Follow up renal function in am.   Type 2 diabetes mellitus with hyperlipidemia (HCC) Glucose well controlled. Off insulin therapy.  Patient is tolerating po well.   Continue with  statin therapy.   History of CVA (cerebrovascular accident) Continue blood pressure control, statin and dual antiplatelet therapy.         Subjective: Patient is feeling better, dyspnea has improved, no chest pain or edema, no PND or orthopnea.   Physical Exam: Vitals:   12/27/22 1414 12/27/22 1416 12/27/22 1418 12/27/22 1530  BP: (!) 158/59 (!) 158/61 (!) 145/58 (!) 149/78  Pulse:      Resp:      Temp:      TempSrc:      SpO2:      Weight:      Height:       Neurology awake and alert ENT with mild pallor Cardiovascular with S1 and S2 present and regular with no gallops, rubs or murmurs No JVD No lower extremity edema Respiratory with prolonged expiratory phase with no wheezing, rales or rhonchi Abdomen with no distention  Data Reviewed:    Family Communication: I spoke with patient's wife at the bedside, we talked in detail about patient's condition, plan of care and prognosis and all questions were addressed.   Disposition: Status is: Inpatient Remains inpatient appropriate because: pending to complete ischemic work up   Planned Discharge Destination: Home   Author: Tawni Millers, MD 12/27/2022 4:17 PM  For on call review [email or web address].

## 2022-12-28 DIAGNOSIS — I5023 Acute on chronic systolic (congestive) heart failure: Secondary | ICD-10-CM | POA: Diagnosis not present

## 2022-12-28 DIAGNOSIS — N184 Chronic kidney disease, stage 4 (severe): Secondary | ICD-10-CM | POA: Diagnosis not present

## 2022-12-28 DIAGNOSIS — E1169 Type 2 diabetes mellitus with other specified complication: Secondary | ICD-10-CM | POA: Diagnosis not present

## 2022-12-28 DIAGNOSIS — I2510 Atherosclerotic heart disease of native coronary artery without angina pectoris: Secondary | ICD-10-CM | POA: Diagnosis not present

## 2022-12-28 LAB — GLUCOSE, CAPILLARY
Glucose-Capillary: 103 mg/dL — ABNORMAL HIGH (ref 70–99)
Glucose-Capillary: 87 mg/dL (ref 70–99)
Glucose-Capillary: 107 mg/dL — ABNORMAL HIGH (ref 70–99)

## 2022-12-28 LAB — BASIC METABOLIC PANEL
Anion gap: 7 (ref 5–15)
BUN: 54 mg/dL — ABNORMAL HIGH (ref 8–23)
CO2: 19 mmol/L — ABNORMAL LOW (ref 22–32)
Calcium: 8.7 mg/dL — ABNORMAL LOW (ref 8.9–10.3)
Chloride: 112 mmol/L — ABNORMAL HIGH (ref 98–111)
Creatinine, Ser: 2.16 mg/dL — ABNORMAL HIGH (ref 0.61–1.24)
GFR, Estimated: 31 mL/min — ABNORMAL LOW (ref >60)
Glucose, Bld: 97 mg/dL (ref 70–99)
Potassium: 3.9 mmol/L (ref 3.5–5.1)
Sodium: 138 mmol/L (ref 135–145)

## 2022-12-28 MED ORDER — AMLODIPINE BESYLATE 5 MG PO TABS
5.0000 mg | ORAL_TABLET | Freq: Every day | ORAL | Status: DC
  Administered 2022-12-28 – 2022-12-29 (×2): 5 mg via ORAL
  Filled 2022-12-28 (×2): qty 1

## 2022-12-28 NOTE — Progress Notes (Signed)
Advanced Heart Failure Rounding Note  PCP-Cardiologist: None   Subjective:    Feels well. Sitting up on side of bed eating breakfast. No CP. No dyspnea.   SBP 140s this morning.   SCr improved, 2.40>>2.16.   Cardiac/ Vascular Studies  Echo EF 35-40% (stable EF but question of new anterior WMA)    RHC 3/22 with low filling pressures (RA 0, PCWP 2) and low output. Output improved with volume.    Renal u/s shows atrophic R kidney no hydronephrosis    Renal Artery Dopplers: patent Rt + Lt Renal arteries w/o stenosis   Nuclear Stress Test:  IMPRESSION: 1. Small in size, mild in severity reversible defect is noted involving the mid anterior and apical anterior segments. Moderate fixed defect noted involving the basal inferior and mid inferior segments.   2. Moderate left ventricular systolic dilatation with diff diffusely diminished left ventricular wall motion and paradoxical motion of the septum.   3. Left ventricular ejection fraction 39%   4. Non invasive risk stratification*: Intermediate  Objective:   Weight Range: 76.3 kg Body mass index is 24.84 kg/m.   Vital Signs:   Temp:  [97.6 F (36.4 C)-98 F (36.7 C)] 97.6 F (36.4 C) (03/26 0428) Pulse Rate:  [53-71] 59 (03/26 0428) Resp:  [14-20] 15 (03/26 0428) BP: (113-160)/(48-78) 148/50 (03/26 0428) SpO2:  [98 %-100 %] 98 % (03/26 0428) Weight:  [76.3 kg] 76.3 kg (03/26 0600) Last BM Date : 12/26/22  Weight change: Filed Weights   12/27/22 0430 12/28/22 0049 12/28/22 0600  Weight: 76.2 kg 76.3 kg 76.3 kg    Intake/Output:   Intake/Output Summary (Last 24 hours) at 12/28/2022 0714 Last data filed at 12/28/2022 0430 Gross per 24 hour  Intake 600 ml  Output 1300 ml  Net -700 ml      Physical Exam    General:  Well appearing. No resp difficulty HEENT: Normal Neck: Supple. JVP not elevated . Carotids 2+ bilat; no bruits. No lymphadenopathy or thyromegaly appreciated. Cor: PMI nondisplaced.  Regular rate & rhythm. No rubs, gallops or murmurs. Lungs: Clear Abdomen: Soft, nontender, nondistended. No hepatosplenomegaly. No bruits or masses. Good bowel sounds. Extremities: No cyanosis, clubbing, rash, edema Neuro: Alert & orientedx3, cranial nerves grossly intact. moves all 4 extremities w/o difficulty. Affect pleasant   Telemetry   NSR 60s   EKG    No new EKG to review   Labs    CBC No results for input(s): "WBC", "NEUTROABS", "HGB", "HCT", "MCV", "PLT" in the last 72 hours. Basic Metabolic Panel Recent Labs    12/27/22 0041 12/28/22 0035  NA 139 138  K 4.1 3.9  CL 111 112*  CO2 18* 19*  GLUCOSE 104* 97  BUN 51* 54*  CREATININE 2.19* 2.16*  CALCIUM 8.8* 8.7*   Liver Function Tests No results for input(s): "AST", "ALT", "ALKPHOS", "BILITOT", "PROT", "ALBUMIN" in the last 72 hours. No results for input(s): "LIPASE", "AMYLASE" in the last 72 hours. Cardiac Enzymes No results for input(s): "CKTOTAL", "CKMB", "CKMBINDEX", "TROPONINI" in the last 72 hours.  BNP: BNP (last 3 results) Recent Labs    09/24/22 1553 12/03/22 0111 12/22/22 0010  BNP 2,778.8* 719.5* 748.9*    ProBNP (last 3 results) No results for input(s): "PROBNP" in the last 8760 hours.   D-Dimer No results for input(s): "DDIMER" in the last 72 hours. Hemoglobin A1C No results for input(s): "HGBA1C" in the last 72 hours. Fasting Lipid Panel No results for input(s): "CHOL", "HDL", "LDLCALC", "  TRIG", "CHOLHDL", "LDLDIRECT" in the last 72 hours. Thyroid Function Tests No results for input(s): "TSH", "T4TOTAL", "T3FREE", "THYROIDAB" in the last 72 hours.  Invalid input(s): "FREET3"  Other results:   Imaging    NM Myocar Multi W/Spect W/Wall Motion / EF  Result Date: 12/27/2022 CLINICAL DATA:  Systolic heart failure. EXAM: MYOCARDIAL IMAGING WITH SPECT (REST AND PHARMACOLOGIC-STRESS) GATED LEFT VENTRICULAR WALL MOTION STUDY LEFT VENTRICULAR EJECTION FRACTION TECHNIQUE: Standard  myocardial SPECT imaging was performed after resting intravenous injection of 10.7 mCi Tc-63m tetrofosmin. Subsequently, intravenous infusion of Lexiscan was performed under the supervision of the Cardiology staff. At peak effect of the drug, 31.4 mCi Tc-67m tetrofosmin was injected intravenously and standard myocardial SPECT imaging was performed. Quantitative gated imaging was also performed to evaluate left ventricular wall motion, and estimate left ventricular ejection fraction. COMPARISON:  None Available. FINDINGS: Perfusion: This study is relatively count poor diminishing exam detail. Small in size, mild in severity reversible defect is identified involving the mid anterior and apical anterior segments. Moderate in size, mild in severity fixed defect is noted involving the basal inferior and mid inferior segments. Wall Motion: There is diffusely diminished left ventricular wall motion with paradoxical motion of the septum identified. Moderate left ventricular systolic dilatation. Left Ventricular Ejection Fraction: 39 % End diastolic volume [id number] ml End systolic volume 85 ml IMPRESSION: 1. Small in size, mild in severity reversible defect is noted involving the mid anterior and apical anterior segments. Moderate fixed defect noted involving the basal inferior and mid inferior segments. 2. Moderate left ventricular systolic dilatation with diff diffusely diminished left ventricular wall motion and paradoxical motion of the septum. 3. Left ventricular ejection fraction 39% 4. Non invasive risk stratification*: Intermediate *2012 Appropriate Use Criteria for Coronary Revascularization Focused Update: J Am Coll Cardiol. [id number]. http://content.[email or web address].aspx?articleid=[email or web address] Electronically Signed   By: Kerby Moors M.D.   On: 12/27/2022 16:35   VAS US RENAL ARTERY DUPLEX  Result Date: 12/27/2022 ABDOMINAL VISCERAL Patient Name:  [name]  Date of Exam:   12/27/2022 Medical Rec  #: [id number]        Accession #:    [id number] Date of Birth: [birth year]        Patient Gender: M Patient Age:   77 years Exam Location:  [facility] Procedure:      VAS US RENAL ARTERY DUPLEX Referring Phys: [id number] DANIEL R BENSIMHON -------------------------------------------------------------------------------- Indications: Severe right renal atrophy is noted on renal US 12/25/22. Right              kidney 7.6 cm, left 11.3 cm. High Risk Factors: Hypertension, coronary artery disease. Other Factors: CABG, history of CKD. Vascular Interventions: History of aorta endograft. Limitations: Air/bowel gas. Performing Technologist: Oda Cogan RDMS, RVT  Examination Guidelines: A complete evaluation includes B-mode imaging, spectral Doppler, color Doppler, and power Doppler as needed of all accessible portions of each vessel. Bilateral testing is considered an integral part of a complete examination. Limited examinations for reoccurring indications may be performed as noted.  Duplex Findings: +--------------------+--------+--------+------+----------------+ Mesenteric          PSV cm/sEDV cm/sPlaque    Comments     +--------------------+--------+--------+------+----------------+ Aorta Prox             72                 Patent endograft +--------------------+--------+--------+------+----------------+ Celiac Artery Origin  Not visualized  +--------------------+--------+--------+------+----------------+ SMA Proximal                               Not visualized  +--------------------+--------+--------+------+----------------+ SMA Mid               165                                  +--------------------+--------+--------+------+----------------+    +------------------+--------+--------+----------------------+ Right Renal ArteryPSV cm/sEDV cm/s       Comment         +------------------+--------+--------+----------------------+ Origin                             Not clearly visualized +------------------+--------+--------+----------------------+ Proximal             63      16                          +------------------+--------+--------+----------------------+ Mid                  68      19                          +------------------+--------+--------+----------------------+ Distal               66      16                          +------------------+--------+--------+----------------------+ +-----------------+--------+--------+----------------------+ Left Renal ArteryPSV cm/sEDV cm/s       Comment         +-----------------+--------+--------+----------------------+ Origin                           Not clearly visualized +-----------------+--------+--------+----------------------+ Proximal            99      22                          +-----------------+--------+--------+----------------------+ Mid                109      25                          +-----------------+--------+--------+----------------------+ Distal             100      31                          +-----------------+--------+--------+----------------------+  Technologist observations: Bilateral kidneys appear within normal limits. Right kidney - 5.6 x 6.0 x 5.0 mid to upper pole +------------+--------+--------+----+-----------+--------+--------+----+ Right KidneyPSV cm/sEDV cm/sRI  Left KidneyPSV cm/sEDV cm/sRI   +------------+--------+--------+----+-----------+--------+--------+----+ Upper Pole                      Upper Pole 135     25      0.82 +------------+--------+--------+----+-----------+--------+--------+----+ Mid         30      11      0.64Mid        28      8  0.71 +------------+--------+--------+----+-----------+--------+--------+----+ Lower Pole  27      13      0.53Lower Pole 48      12      0.73 +------------+--------+--------+----+-----------+--------+--------+----+ Hilar       38      8        0.79Hilar      40      12      0.69 +------------+--------+--------+----+-----------+--------+--------+----+ +------------------+-----+------------------+-----+ Right Kidney           Left Kidney             +------------------+-----+------------------+-----+ RAR                    RAR                     +------------------+-----+------------------+-----+ RAR (manual)      1.1  RAR (manual)      1.3   +------------------+-----+------------------+-----+ Cortex                 Cortex                  +------------------+-----+------------------+-----+ Cortex thickness       Corex thickness         +------------------+-----+------------------+-----+ Kidney length (cm)11.50Kidney length (cm)11.20 +------------------+-----+------------------+-----+  Summary: Renal:  Right: Patent right renal artery without evidence of significant        stenosis at the proximal, mid and distal levels. Origin of        the renal artery was not clearly visualized. Renal size        appears within normal limits on this exam. Renal cyst was        noted. Measurement listed above. Left:  Patent left renal artery without evidence of significant        stenosis at the proximal , mid and distal levels. Origin of        the renal artery was not clearly visualized. Abnormal left        resistive index. Renal size appears normal.  *See table(s) above for measurements and observations.  Diagnosing physician: Deitra Mayo MD  Electronically signed by Deitra Mayo MD on 12/27/2022 at 2:03:23 PM.    Final      Medications:     Scheduled Medications:  aspirin  81 mg Oral Daily   atorvastatin  20 mg Oral QHS   carvedilol  3.125 mg Oral BID WC   clopidogrel  75 mg Oral Daily   enoxaparin (LOVENOX) injection  30 mg Subcutaneous Q24H   famotidine  20 mg Oral QODAY   finasteride  5 mg Oral Daily   FLUoxetine  40 mg Oral Daily   gabapentin  300 mg Oral BID   hydrALAZINE  25 mg Oral Q8H    isosorbide mononitrate  30 mg Oral Daily   mirtazapine  45 mg Oral QHS   pantoprazole  40 mg Oral QAC breakfast   rivastigmine  9.5 mg Transdermal Daily   sodium chloride flush  3 mL Intravenous Q12H   sodium chloride flush  3 mL Intravenous Q12H   sodium chloride flush  3 mL Intravenous Q12H    Infusions:  sodium chloride      PRN Medications: sodium chloride, acetaminophen, sodium chloride flush    Patient Profile   77 y/o male w/ h/o chronic systolic heart failure, last echo 12/23 EF 30-35% w/ normal RV, CAD s/p CABG x 3 in 2012 (LIMA-LAD, SVG-OM, SVG-PDA), HTN,  HLD, Type 2DM, CVA, carotid stenosis s/p carotid artery stenting, AAA s/p aorto bi-iliac stent graft, CKD IIIa, COPD, PTSA and carpal tunnel syndrome, admitted w/ a/c systolic heart failure.   Assessment/Plan   1. Acute on Chronic Systolic Heart Failure  - Echo 2013 EF 55-60%, RV normal   -  Echo 12/23 EF 30-35% w/ normal RV -  Admitted w/ acute pulmonary edema, NYHA Class IIIb  - RHC with very low filling pressures. Hs trop 80 -> 474 -> 365 - Echo EF 35-40% (stable EF but question of new anterior WMA)  - Has h/o CAD w/ remote CABG but no recent ischemic CP. NST this admit showed small in size, mild in severity, reversible defect involving the mid anterior and apical anterior segments and moderate fixed defect involving the basal inferior and mid inferior segments. Given small territory, absence of CP and CKD, will plan to treat medically. - Volume status stable.  - GDMT limited by CKD  - continue carvedilol 3.125 mg bid. Bradycardia limits titration  - continue hydral/Imdur for afterload reduction. With AKI on CKD IV would keep SBP over 130 for now    2. Hypertensive Urgency  - SBPs 200s on admit - now 0000000 systolic  - GDMT per above  - Will not push anti-HTN regimen too given CKD  - Renal US = severe R renal atrophy no hydronephrosis. Renal doppler negative for RAS    3. CAD w/ Elevated HS Trop  - s/p CABG in  2012 (LIMA-LAD, SVG-OM, SVG-PDA) - NST 06/2014   Low risk stress nuclear study with small inferobasal scar, EF 56%  - Hs trop 87>>474 - NST this admit showed small in size, mild in severity, reversible defect involving the mid anterior and apical anterior segments and moderate fixed defect involving the basal inferior and mid inferior segments. Given small territory, absence of CP and CKD, will plan to treat medically. - cont ASA, Plavix, ? blocker + statin    4. AKI on CKD IIIb - suspect cardiorenal/overdiuresis - B/l SCr ~2.4 - SCr up from 2.4 on admission>>2.74>>3.29 -> 2.99>2.2 today - Renal US-Severe R renal atrophy. No hydronephrosis.  - Vascular Renal Artery Duplex negative for RAS    5. Type 2DM - per IM    6. PAD - has h/o of AAA with stent with post-op leak and previous carotid stent at [facility]     Length of Stay: [address], PA-C  12/28/2022, 7:14 AM  Advanced Heart Failure Team Pager [phone] (M-F; 7a - 5p)  Please contact [facility] for night-coverage after hours (5p -7a ) and weekends on [email or web address]

## 2022-12-28 NOTE — Plan of Care (Signed)
  Problem: Education: Goal: Ability to demonstrate management of disease process will improve Outcome: Progressing Goal: Ability to verbalize understanding of medication therapies will improve Outcome: Progressing Goal: Individualized Educational Video(s) Outcome: Progressing   Problem: Activity: Goal: Capacity to carry out activities will improve Outcome: Progressing   Problem: Cardiac: Goal: Ability to achieve and maintain adequate cardiopulmonary perfusion will improve Outcome: Progressing   Problem: Education: Goal: Ability to describe self-care measures that may prevent or decrease complications (Diabetes Survival Skills Education) will improve Outcome: Progressing Goal: Individualized Educational Video(s) Outcome: Progressing   Problem: Coping: Goal: Ability to adjust to condition or change in health will improve Outcome: Progressing   Problem: Fluid Volume: Goal: Ability to maintain a balanced intake and output will improve Outcome: Progressing   Problem: Health Behavior/Discharge Planning: Goal: Ability to identify and utilize available resources and services will improve Outcome: Progressing Goal: Ability to manage health-related needs will improve Outcome: Progressing   Problem: Metabolic: Goal: Ability to maintain appropriate glucose levels will improve Outcome: Progressing   Problem: Nutritional: Goal: Maintenance of adequate nutrition will improve Outcome: Progressing Goal: Progress toward achieving an optimal weight will improve Outcome: Progressing   Problem: Skin Integrity: Goal: Risk for impaired skin integrity will decrease Outcome: Progressing   Problem: Tissue Perfusion: Goal: Adequacy of tissue perfusion will improve Outcome: Progressing   Problem: Education: Goal: Knowledge of General Education information will improve Description: Including pain rating scale, medication(s)/side effects and non-pharmacologic comfort measures Outcome:  Progressing   Problem: Health Behavior/Discharge Planning: Goal: Ability to manage health-related needs will improve Outcome: Progressing   Problem: Clinical Measurements: Goal: Ability to maintain clinical measurements within normal limits will improve Outcome: Progressing Goal: Will remain free from infection Outcome: Progressing Goal: Diagnostic test results will improve Outcome: Progressing Goal: Respiratory complications will improve Outcome: Progressing Goal: Cardiovascular complication will be avoided Outcome: Progressing   Problem: Activity: Goal: Risk for activity intolerance will decrease Outcome: Progressing   Problem: Nutrition: Goal: Adequate nutrition will be maintained Outcome: Progressing   Problem: Coping: Goal: Level of anxiety will decrease Outcome: Progressing   Problem: Elimination: Goal: Will not experience complications related to bowel motility Outcome: Progressing Goal: Will not experience complications related to urinary retention Outcome: Progressing   Problem: Pain Managment: Goal: General experience of comfort will improve Outcome: Progressing   Problem: Safety: Goal: Ability to remain free from injury will improve Outcome: Progressing   Problem: Skin Integrity: Goal: Risk for impaired skin integrity will decrease Outcome: Progressing   Problem: Education: Goal: Understanding of CV disease, CV risk reduction, and recovery process will improve Outcome: Progressing Goal: Individualized Educational Video(s) Outcome: Progressing   Problem: Activity: Goal: Ability to return to baseline activity level will improve Outcome: Progressing   Problem: Cardiovascular: Goal: Ability to achieve and maintain adequate cardiovascular perfusion will improve Outcome: Progressing Goal: Vascular access site(s) Level 0-1 will be maintained Outcome: Progressing   Problem: Health Behavior/Discharge Planning: Goal: Ability to safely manage  health-related needs after discharge will improve Outcome: Progressing   

## 2022-12-28 NOTE — Progress Notes (Signed)
Progress Note   Patient: [name] [id number] DOB: 1946-04-28 DOA: 12/21/2022     6 DOS: the patient was seen and examined on 12/28/2022   Brief hospital course: [name] was admitted to the hospital with the working diagnosis of heart failure exacerbation.   77 yo male with the past medical history of hypertension, T2DM, coronary artery disease, hx of CVA, BPH and carotid stenosis with presented with dyspnea. Reported rapid worsening of dyspnea. Last night he had PND and was note able to cath his breath. EMS was called and he was found hypertensive with systolic in the [id number] and hypoxemic with 02 saturation 47% on room air. He received nitroglycerin SL, placed on Cpap and transported to the ED. In the emergency room he was placed on Bipap, for respiratory distress, blood pressure 132/74, HR 68, RR 19 and 02 saturation 100%, lungs with rales but no wheezing, heart with S1 and S2 present and rhythmic, abdomen with no distention, and no lower extremity edema.   Na 135, K 5.1 CL 105 bicarbonate 23, glucose 125 bun 33 cr 2,24  BNP 719 High sensitive troponin 20, 87 and 474  Lactic acid 3,2  Wbc 14.7 hgb 10,9 plt 211  Sars covid 19 negative   Chest radiograph with mild cardiomegaly with bilateral hilar vascular congestion and cephalization of the vasculature.  Sternotomy wires in place.   EKG 92 bpm, left axis deviation, interventricular conduction delay, not  qtc prolongation, sinus rhythm with q wave V1 and V2 with no significant ST segment or T wave changes.   03/22 echocardiogram with wall motion abnormalities.  Volume status has improved.  Consulted cardiology.  03/23 cardiac catheterization with no elevation in filling pressures.  Plan for cardiac nuclear stress test inpatient.  03/24 clinically stable, pending stress test possible tomorrow.  03/25 stress test and renal artery doppler US part of the workup.  03/26 stress test with intermediate risk.   Assessment and  Plan: * Acute on chronic systolic CHF (congestive heart failure) (HCC) Follow up echocardiogram with reduced LV systolic function with EF 35 to 40%, entire inferior wall, basal infero lateral segment, and apical septal segment are akinetic. The entire anterior wall, antero lateral wall and mid distal lateral wall, anterior septum mid inferoseptal, basal infero septal segment and apex are akinetic. No significant valvular disease.   03/22 cardiac catheterization  PA 22/11 (11). PCWP 2 Cardiac output/ index+ 4.5 and 2.4  PVR 2.0 wu   Volume status has improved.  Urine output is [id number] ml Systolic blood pressure 0000000 to 143 mmHg.   Started on hydralazine and isosorbide for after load reduction.  Continue carvedilol. Holding diuretic therapy until renal function more stable.   CAD (coronary artery disease) Sp CABG, with ischemic heart disease.  Patient with chest pain at home along with dyspnea. No active chest pain during his hospirtalization.  Echocardiogram with new wall motion abnormalities anterior wall akinetic and hypokinetic.   Plan to continue medical therapy with aspirin, clopidogrel and atorvastatin. Stress test with intermediate risk, no active angina.  Likely will continue medical therapy over invasive testing due to low GFR.   Essential hypertension Continue blood pressure control with carvedilol, hydralazine and isosorbide.   Renal artery doppler US: Right patent renal artery with no significant evidence of stenosis at the proximal, mid and distal levels.  Left patent renal artery with no evidence of significant stenosis at the proximal, mid and distal levels.   Consider low dose ARB trial. Patient has  been on lisinopril at home.    CKD (chronic kidney disease) stage 4, GFR 15-29 ml/min (HCC) AKI,   Renal function has been stable over last 48 hrs, today is 2,16 with K at 3,9 and serum bicarbonate at 19 Na is 138 and BUN 54   Continue to hold on diuretic  therapy. Follow up renal function in am.   Type 2 diabetes mellitus with hyperlipidemia (HCC) Glucose well controlled. Off insulin therapy.  Patient is tolerating po well.   Continue with statin therapy.   History of CVA (cerebrovascular accident) Continue blood pressure control, statin and dual antiplatelet therapy.         Subjective: Patient is feeling better, no chest pain or dyspnea, no PND or orthopnea, no lower extremity edema.   Physical Exam: Vitals:   12/28/22 0428 12/28/22 0600 12/28/22 0730 12/28/22 1144  BP: (!) 148/50  (!) 138/95 133/61  Pulse: (!) 59  68 60  Resp: 15  16 19   Temp: 97.6 F (36.4 C)  98 F (36.7 C) 97.8 F (36.6 C)  TempSrc: Oral  Oral Oral  SpO2: 98%  100% 100%  Weight:  76.3 kg    Height:       Neurology awake and alert ENT with mild pallor Cardiovascular with S1 and S2 present and rhythmic with no gallops, rubs or murmurs No JVD No lower extremity edema Respiratory with no rales or wheezing, no rhonchi, no rales Abdomen with no distention  Data Reviewed:    Family Communication: I spoke with patient's wife at the bedside, we talked in detail about patient's condition, plan of care and prognosis and all questions were addressed.   Disposition: Status is: Inpatient Remains inpatient appropriate because: blood pressure monitoring   Planned Discharge Destination: Home    Author: Tawni Millers, MD 12/28/2022 1:33 PM  For on call review [email or web address].

## 2022-12-28 NOTE — Evaluation (Addendum)
Occupational Therapy Treatment Patient Details Name: [name] MRN: [id number] DOB: 1946-08-18 Today's Date: 12/28/2022   History of present illness Pt is a 77 y/o M presenting to ED on 3/19 from home with SOB (SpO2 47% on RA) and hypertensive, admitted for acute on chronic systolic CHF. PMH includes HTN, DM2, CAD, COPD, CVA, BPH, carotid artery stenosis, Vitamin B12 deficiency, AAA.   OT comments  Pt progressing towards goals this session, needing supervision-min guard A for ADLs, supervision for bed mobility,and supervision for transfers with use of rollator. Pt VSS on RA throughout, reiterated CHF education, pt able to recall needing to weigh self and that he "can't have french fries". Pt presenting with impairments listed below, will follow acutely to maximize safety/independence with ADLs and functional mobility.   Recommendations for follow up therapy are one component of a multi-disciplinary discharge planning process, led by the attending physician.  Recommendations may be updated based on patient status, additional functional criteria and insurance authorization.    Assistance Recommended at Discharge Intermittent Supervision/Assistance  Patient can return home with the following  A little help with walking and/or transfers;A lot of help with bathing/dressing/bathroom;Assistance with cooking/housework;Direct supervision/assist for medications management;Direct supervision/assist for financial management;Assist for transportation;Help with stairs or ramp for entrance   Equipment Recommendations  BSC/3in1    Recommendations for Other Services PT consult    Precautions / Restrictions Precautions Precautions: Fall Restrictions Weight Bearing Restrictions: No       Mobility Bed Mobility Overal bed mobility: Needs Assistance Bed Mobility: Supine to Sit     Supine to sit: Supervision, HOB elevated          Transfers Overall transfer level: Needs assistance Equipment  used: Rollator (4 wheels) Transfers: Sit to/from Stand Sit to Stand: Supervision                 Balance Overall balance assessment: Needs assistance Sitting-balance support: Feet supported Sitting balance-Leahy Scale: Good Sitting balance - Comments: reaches minimally outside BOS without LOB   Standing balance support: During functional activity, Reliant on assistive device for balance, Bilateral upper extremity supported Standing balance-Leahy Scale: Good Standing balance comment: able to perform dynamic balance to adjust bed pad without UE support                           ADL either performed or assessed with clinical judgement   ADL Overall ADL's : Needs assistance/impaired     Grooming: Min guard;Standing                   Toilet Transfer: Engineer, manufacturing (4 wheels);Ambulation;Regular Museum/gallery exhibitions officer and Hygiene: Supervision/safety       Functional mobility during ADLs: Engineer, manufacturing (4 wheels)      Extremity/Trunk Assessment Upper Extremity Assessment Upper Extremity Assessment: Overall WFL for tasks assessed   Lower Extremity Assessment Lower Extremity Assessment: Overall WFL for tasks assessed        Vision   Vision Assessment?: No apparent visual deficits   Perception Perception Perception: Not tested   Praxis Praxis Praxis: Not tested    Cognition Arousal/Alertness: Awake/alert Behavior During Therapy: WFL for tasks assessed/performed Overall Cognitive Status: No family/caregiver present to determine baseline cognitive functioning                                 General Comments: able to recall CHF  mgmt strategies and that he "isn't supposed to have french fries anymore"        Exercises      Shoulder Instructions       General Comments VSS on RA    Pertinent Vitals/ Pain       Pain Assessment Pain Assessment: No/denies pain  Home Living                                           Prior Functioning/Environment              Frequency  Min 2X/week        Progress Toward Goals  OT Goals(current goals can now be found in the care plan section)  Progress towards OT goals: Progressing toward goals  Acute Rehab OT Goals Patient Stated Goal: none stated OT Goal Formulation: With patient Time For Goal Achievement: 01/05/23 Potential to Achieve Goals: Good ADL Goals Pt Will Perform Upper Body Dressing: with supervision;sitting Pt Will Perform Lower Body Dressing: with supervision;sitting/lateral leans;sit to/from stand Pt Will Transfer to Toilet: with supervision;ambulating;regular height toilet Pt Will Perform Tub/Shower Transfer: Tub transfer;Shower transfer;with supervision;ambulating Additional ADL Goal #1: pt will follow 3 step command in prep for ADLs  Plan Discharge plan remains appropriate;Frequency remains appropriate    Co-evaluation                 AM-PAC OT "6 Clicks" Daily Activity     Outcome Measure   Help from another person eating meals?: None Help from another person taking care of personal grooming?: A Little Help from another person toileting, which includes using toliet, bedpan, or urinal?: A Little Help from another person bathing (including washing, rinsing, drying)?: A Little Help from another person to put on and taking off regular upper body clothing?: A Little Help from another person to put on and taking off regular lower body clothing?: A Little 6 Click Score: 19    End of Session Equipment Utilized During Treatment: Rollator (4 wheels);Gait belt  OT Visit Diagnosis: Unsteadiness on feet (R26.81);Other abnormalities of gait and mobility (R26.89);Muscle weakness (generalized) (M62.81);History of falling (Z91.81)   Activity Tolerance Patient tolerated treatment well   Patient Left in chair;with call bell/phone within reach;with chair alarm set   Nurse Communication Mobility  status        Time: [id number] OT Time Calculation (min): 22 min  Charges: OT General Charges $OT Visit: 1 Visit OT Treatments $Self Care/Home Management : 8-22 mins  Renaye Rakers, OTD, OTR/L SecureChat Preferred Acute Rehab [phone] - [place] 12/28/2022, 4:34 PM

## 2022-12-29 ENCOUNTER — Other Ambulatory Visit (HOSPITAL_COMMUNITY): Payer: Self-pay

## 2022-12-29 DIAGNOSIS — I2510 Atherosclerotic heart disease of native coronary artery without angina pectoris: Secondary | ICD-10-CM | POA: Diagnosis not present

## 2022-12-29 DIAGNOSIS — I5023 Acute on chronic systolic (congestive) heart failure: Secondary | ICD-10-CM | POA: Diagnosis not present

## 2022-12-29 DIAGNOSIS — E1169 Type 2 diabetes mellitus with other specified complication: Secondary | ICD-10-CM | POA: Diagnosis not present

## 2022-12-29 DIAGNOSIS — Z8673 Personal history of transient ischemic attack (TIA), and cerebral infarction without residual deficits: Secondary | ICD-10-CM | POA: Diagnosis not present

## 2022-12-29 LAB — GLUCOSE, CAPILLARY
Glucose-Capillary: 118 mg/dL — ABNORMAL HIGH (ref 70–99)
Glucose-Capillary: 96 mg/dL (ref 70–99)

## 2022-12-29 LAB — BASIC METABOLIC PANEL
Anion gap: 9 (ref 5–15)
BUN: 53 mg/dL — ABNORMAL HIGH (ref 8–23)
CO2: 19 mmol/L — ABNORMAL LOW (ref 22–32)
Calcium: 9.1 mg/dL (ref 8.9–10.3)
Chloride: 111 mmol/L (ref 98–111)
Creatinine, Ser: 2.22 mg/dL — ABNORMAL HIGH (ref 0.61–1.24)
GFR, Estimated: 30 mL/min — ABNORMAL LOW (ref >60)
Glucose, Bld: 108 mg/dL — ABNORMAL HIGH (ref 70–99)
Potassium: 4.1 mmol/L (ref 3.5–5.1)
Sodium: 139 mmol/L (ref 135–145)

## 2022-12-29 MED ORDER — ISOSORBIDE MONONITRATE ER 30 MG PO TB24
30.0000 mg | ORAL_TABLET | Freq: Every day | ORAL | 0 refills | Status: DC
  Filled 2022-12-29: qty 30, 30d supply, fill #0

## 2022-12-29 MED ORDER — ISOSORBIDE MONONITRATE ER 30 MG PO TB24: 30.0000 mg | ORAL_TABLET | Freq: Every day | ORAL | 0 refills | Status: DC

## 2022-12-29 MED ORDER — HYDRALAZINE HCL 25 MG PO TABS: 25.0000 mg | ORAL_TABLET | Freq: Three times a day (TID) | ORAL | 0 refills | Status: DC

## 2022-12-29 MED ORDER — AMLODIPINE BESYLATE 10 MG PO TABS: 10.0000 mg | ORAL_TABLET | Freq: Every day | ORAL | Status: DC

## 2022-12-29 MED ORDER — CARVEDILOL 3.125 MG PO TABS
3.1250 mg | ORAL_TABLET | Freq: Two times a day (BID) | ORAL | 0 refills | Status: DC
  Filled 2022-12-29: qty 60, 30d supply, fill #0

## 2022-12-29 MED ORDER — FUROSEMIDE 20 MG PO TABS
20.0000 mg | ORAL_TABLET | Freq: Every day | ORAL | 1 refills | Status: DC | PRN
  Filled 2022-12-29: qty 30, 30d supply, fill #0

## 2022-12-29 MED ORDER — AMLODIPINE BESYLATE 5 MG PO TABS: 5.0000 mg | ORAL_TABLET | Freq: Every day | ORAL | 0 refills | Status: DC

## 2022-12-29 MED ORDER — HYDRALAZINE HCL 25 MG PO TABS
25.0000 mg | ORAL_TABLET | Freq: Three times a day (TID) | ORAL | 0 refills | Status: DC
  Filled 2022-12-29: qty 90, 30d supply, fill #0

## 2022-12-29 MED ORDER — CARVEDILOL 3.125 MG PO TABS: 3.1250 mg | ORAL_TABLET | Freq: Two times a day (BID) | ORAL | 0 refills | Status: AC

## 2022-12-29 MED ORDER — AMLODIPINE BESYLATE 5 MG PO TABS
5.0000 mg | ORAL_TABLET | Freq: Every day | ORAL | 0 refills | Status: DC
  Filled 2022-12-29: qty 30, 30d supply, fill #0

## 2022-12-29 MED ORDER — AMLODIPINE BESYLATE 5 MG PO TABS: 5.0000 mg | ORAL_TABLET | Freq: Every day | ORAL | Status: DC

## 2022-12-29 NOTE — Progress Notes (Addendum)
Advanced Heart Failure Rounding Note  PCP-Cardiologist: None   Subjective:    No complaints. Wants to go home.     SCr improved, 2.40>>2.16>>2.2 today    Cardiac/ Vascular Studies  Echo EF 35-40% (stable EF but question of new anterior WMA)    RHC 3/22 with low filling pressures (RA 0, PCWP 2) and low output. Output improved with volume.    Renal u/s shows atrophic R kidney no hydronephrosis    Renal Artery Dopplers: patent Rt + Lt Renal arteries w/o stenosis   Nuclear Stress Test: IMPRESSION: 1. Small in size, mild in severity reversible defect is noted involving the mid anterior and apical anterior segments. Moderate fixed defect noted involving the basal inferior and mid inferior segments.  2. Moderate left ventricular systolic dilatation with diff diffusely diminished left ventricular wall motion and paradoxical motion of the septum.  3. Left ventricular ejection fraction 39%  4. Non invasive risk stratification*: Intermediate  Objective:   Weight Range: 74.6 kg Body mass index is 24.29 kg/m.   Vital Signs:   Temp:  [97.5 F (36.4 C)-98.1 F (36.7 C)] 98 F (36.7 C) (03/27 0505) Pulse Rate:  [52-62] 62 (03/27 0732) Resp:  [11-19] 19 (03/27 0732) BP: (112-156)/(49-70) 147/60 (03/27 0732) SpO2:  [94 %-100 %] 99 % (03/27 0732) Weight:  [74.6 kg] 74.6 kg (03/27 0002) Last BM Date : 12/26/22  Weight change: Filed Weights   12/28/22 0049 12/28/22 0600 12/29/22 0002  Weight: 76.3 kg 76.3 kg 74.6 kg    Intake/Output:   Intake/Output Summary (Last 24 hours) at 12/29/2022 0801 Last data filed at 12/29/2022 0506 Gross per 24 hour  Intake 420 ml  Output 2000 ml  Net -1580 ml      Physical Exam    General: . No resp difficulty HEENT: normal Neck: supple. no JVD. Carotids 2+ bilat; no bruits. No lymphadenopathy or thryomegaly appreciated. Cor: PMI nondisplaced. Regular rate & rhythm. No rubs, gallops or murmurs. Lungs: clear Abdomen: soft,  nontender, nondistended. No hepatosplenomegaly. No bruits or masses. Good bowel sounds. Extremities: no cyanosis, clubbing, rash, edema Neuro: alert & orientedx3, cranial nerves grossly intact. moves all 4 extremities w/o difficulty. Affect pleasant   Telemetry   SR 60s   EKG    No new EKG to review   Labs    CBC No results for input(s): "WBC", "NEUTROABS", "HGB", "HCT", "MCV", "PLT" in the last 72 hours. Basic Metabolic Panel Recent Labs    12/28/22 0035 12/29/22 0033  NA 138 139  K 3.9 4.1  CL 112* 111  CO2 19* 19*  GLUCOSE 97 108*  BUN 54* 53*  CREATININE 2.16* 2.22*  CALCIUM 8.7* 9.1   Liver Function Tests No results for input(s): "AST", "ALT", "ALKPHOS", "BILITOT", "PROT", "ALBUMIN" in the last 72 hours. No results for input(s): "LIPASE", "AMYLASE" in the last 72 hours. Cardiac Enzymes No results for input(s): "CKTOTAL", "CKMB", "CKMBINDEX", "TROPONINI" in the last 72 hours.  BNP: BNP (last 3 results) Recent Labs    09/24/22 1553 12/03/22 0111 12/22/22 0010  BNP 2,778.8* 719.5* 748.9*    ProBNP (last 3 results) No results for input(s): "PROBNP" in the last 8760 hours.   D-Dimer No results for input(s): "DDIMER" in the last 72 hours. Hemoglobin A1C No results for input(s): "HGBA1C" in the last 72 hours. Fasting Lipid Panel No results for input(s): "CHOL", "HDL", "LDLCALC", "TRIG", "CHOLHDL", "LDLDIRECT" in the last 72 hours. Thyroid Function Tests No results for input(s): "TSH", "T4TOTAL", "T3FREE", "  THYROIDAB" in the last 72 hours.  Invalid input(s): "FREET3"  Other results:   Imaging    No results found.   Medications:     Scheduled Medications:  amLODipine  5 mg Oral Daily   aspirin  81 mg Oral Daily   atorvastatin  20 mg Oral QHS   carvedilol  3.125 mg Oral BID WC   clopidogrel  75 mg Oral Daily   enoxaparin (LOVENOX) injection  30 mg Subcutaneous Q24H   famotidine  20 mg Oral QODAY   finasteride  5 mg Oral Daily   FLUoxetine   40 mg Oral Daily   gabapentin  300 mg Oral BID   hydrALAZINE  25 mg Oral Q8H   isosorbide mononitrate  30 mg Oral Daily   mirtazapine  45 mg Oral QHS   pantoprazole  40 mg Oral QAC breakfast   rivastigmine  9.5 mg Transdermal Daily   sodium chloride flush  3 mL Intravenous Q12H   sodium chloride flush  3 mL Intravenous Q12H   sodium chloride flush  3 mL Intravenous Q12H    Infusions:  sodium chloride      PRN Medications: sodium chloride, acetaminophen, sodium chloride flush    Patient Profile   77 y/o male w/ h/o chronic systolic heart failure, last echo 12/23 EF 30-35% w/ normal RV, CAD s/p CABG x 3 in 2012 (LIMA-LAD, SVG-OM, SVG-PDA), HTN, HLD, Type 2DM, CVA, carotid stenosis s/p carotid artery stenting, AAA s/p aorto bi-iliac stent graft, CKD IIIa, COPD, PTSA and carpal tunnel syndrome, admitted w/ a/c systolic heart failure.   Assessment/Plan   1. Acute on Chronic Systolic Heart Failure  - Echo 2013 EF 55-60%, RV normal   -  Echo 12/23 EF 30-35% w/ normal RV -  Admitted w/ acute pulmonary edema, NYHA Class IIIb  - RHC with very low filling pressures. Hs trop 80 -> 474 -> 365 - Echo EF 35-40% (stable EF but question of new anterior WMA)  - Has h/o CAD w/ remote CABG but no recent ischemic CP. NST this admit showed small in size, mild in severity, reversible defect involving the mid anterior and apical anterior segments and moderate fixed defect involving the basal inferior and mid inferior segments. Given small territory, absence of CP and CKD, will plan to treat medically. - Appears euvolemic. Does not need diuretics.  - GDMT limited by CKD . Hold off on SGLT2i.  - continue carvedilol 3.125 mg bid. Bradycardia limits titration  - continue hydral/Imdur for afterload reduction. With AKI on CKD IV would keep SBP over 130 for now .    2. Hypertensive Urgency  - SBPs 200s on admit - resolved.   - GDMT per above  - Will not push anti-HTN regimen too given CKD  - Continue  amlodipine 5mg  daily.  - Renal US = severe R renal atrophy no hydronephrosis.  - Renal doppler negative for RAS  - He has BP cuff at home. Asked him to record BP and bring to his appointment.    3. CAD w/ Elevated HS Trop  - s/p CABG in 2012 (LIMA-LAD, SVG-OM, SVG-PDA) - NST 06/2014   Low risk stress nuclear study with small inferobasal scar, EF 56%  - Hs trop 87>>474 - NST this admit showed small in size, mild in severity, reversible defect involving the mid anterior and apical anterior segments and moderate fixed defect involving the basal inferior and mid inferior segments. Given small territory, absence of CP and CKD, will plan  to treat medically. - no chest pain.  - cont ASA, Plavix, ? blocker + statin    4. AKI on CKD IIIb - suspect cardiorenal/overdiuresis - B/l SCr ~2.4 - SCr up from 2.4 on admission>>peaked at 3.3>2.2 today.  - Renal US-Severe R renal atrophy. No hydronephrosis.  - Vascular Renal Artery Duplex negative for RAS  - Will need follow up with nephrology.    5. Type 2DM - per IM    6. PAD - has h/o of AAA with stent with post-op leak and previous carotid stent at [facility] for d/c . HF follow has been set up.  HF Meds for d/c  Amlodipine 5 mg daily  Aspirin 81 mg daily Atorvastatin 20 mg daily Coreg 3.125 mg twice a day.   Hydralazine 25 mg tid Imdur 30 mg daily    Length of Stay: 7  Nalini Alcaraz, NP  12/29/2022, 8:01 AM  Advanced Heart Failure Team Pager [phone] (M-F; 7a - 5p)  Please contact [facility] for night-coverage after hours (5p -7a ) and weekends on [email or web address]

## 2022-12-29 NOTE — TOC Transition Note (Addendum)
Transition of Care [facility]) - CM/SW Discharge Note   Patient Details  Name: [name] MRN: [id number] Date of Birth: 1946/05/03  [facility] CM/SW Contact:  Zenon Mayo, RN Phone Number: 12/29/2022, 12:13 PM   Clinical Narrative:    Patient is for dc today, wife is at bedside and will transport him home, the [place] notification referral ID is [id number] for this admit.  Patient states he does not want a BSC.  He goes to the [facility], PCP is Dr. Lou Miner.    Final next level of care: Home/Self Care Barriers to Discharge: Continued Medical Work up   Patient Goals and CMS Choice   Choice offered to / list presented to : NA  Discharge Placement                         Discharge Plan and Services Additional resources added to the After Visit Summary for     Discharge Planning Services: CM Consult Post Acute Care Choice: NA          DME Arranged: N/A DME Agency: NA       HH Arranged: NA          Social Determinants of Health (SDOH) Interventions SDOH Screenings   Food Insecurity: No Food Insecurity (12/28/2022)  Housing: Low Risk  (12/28/2022)  Transportation Needs: No Transportation Needs (12/28/2022)  Utilities: Not At Risk (12/28/2022)  Tobacco Use: Medium Risk (12/27/2022)     Readmission Risk Interventions    12/22/2022    4:38 PM 09/24/2022    3:44 PM  Readmission Risk Prevention Plan  Transportation Screening Complete Complete  PCP or Specialist Appt within 5-7 Days  Complete  PCP or Specialist Appt within 3-5 Days Complete   Home Care Screening  Complete  Medication Review (RN CM)  Complete  HRI or Home Care Consult Complete   Palliative Care Screening Not Applicable   Medication Review (RN Care Manager) Complete

## 2022-12-29 NOTE — Progress Notes (Signed)
Occupational Therapy Treatment/Discharge Patient Details Name: [name] MRN: [id number] DOB: 1945-10-24 Today's Date: 12/29/2022   History of present illness Pt is a 77 y/o M presenting to ED on 3/19 from home with SOB (SpO2 47% on RA) and hypertensive, admitted for acute on chronic systolic CHF. PMH includes HTN, DM2, CAD, COPD, CVA, BPH, carotid artery stenosis, Vitamin B12 deficiency, AAA.   OT comments  Pt making excellent progress towards OT goals and eager to DC home today. Focus on ADL assessment, long distance hallway mobility with RW and education re: energy conservation and CHF mgmt in daily routine. Handouts provided to maximize carryover of education. Anticipate no immediate OT needs at DC and no further acute OT needs. OT to sign off.  HR 60-70s   Recommendations for follow up therapy are one component of a multi-disciplinary discharge planning process, led by the attending physician.  Recommendations may be updated based on patient status, additional functional criteria and insurance authorization.    Assistance Recommended at Discharge Intermittent Supervision/Assistance  Patient can return home with the following  Assistance with cooking/housework;Direct supervision/assist for medications management;Direct supervision/assist for financial management;Assist for transportation;Help with stairs or ramp for entrance;A little help with bathing/dressing/bathroom   Equipment Recommendations  BSC/3in1    Recommendations for Other Services      Precautions / Restrictions Precautions Precautions: Fall Restrictions Weight Bearing Restrictions: No       Mobility Bed Mobility Overal bed mobility: Modified Independent Bed Mobility: Supine to Sit, Sit to Supine     Supine to sit: Modified independent (Device/Increase time) Sit to supine: Modified independent (Device/Increase time)        Transfers Overall transfer level: Needs assistance Equipment used: Rolling  walker (2 wheels) Transfers: Sit to/from Stand Sit to Stand: Modified independent (Device/Increase time)                 Balance Overall balance assessment: Needs assistance Sitting-balance support: Feet supported Sitting balance-Leahy Scale: Good     Standing balance support: During functional activity, Reliant on assistive device for balance, Bilateral upper extremity supported Standing balance-Leahy Scale: Good                             ADL either performed or assessed with clinical judgement   ADL Overall ADL's : Needs assistance/impaired                     Lower Body Dressing: Minimal assistance;Sitting/lateral leans;Sit to/from stand Lower Body Dressing Details (indicate cue type and reason): assist for socks at baseline             Functional mobility during ADLs: Supervision/safety;Rolling walker (2 wheels)      Extremity/Trunk Assessment Upper Extremity Assessment Upper Extremity Assessment: Overall WFL for tasks assessed   Lower Extremity Assessment Lower Extremity Assessment: Defer to PT evaluation        Vision   Vision Assessment?: No apparent visual deficits   Perception     Praxis      Cognition Arousal/Alertness: Awake/alert Behavior During Therapy: WFL for tasks assessed/performed Overall Cognitive Status: No family/caregiver present to determine baseline cognitive functioning                                 General Comments: able to recall CHF mgmt strategies and that he "isn't supposed to have french fries anymore"; easily distracted  Exercises      Shoulder Instructions       General Comments VSS on RA    Pertinent Vitals/ Pain       Pain Assessment Pain Assessment: No/denies pain  Home Living                                          Prior Functioning/Environment              Frequency  Min 2X/week        Progress Toward Goals  OT Goals(current  goals can now be found in the care plan section)  Progress towards OT goals: Progressing toward goals  Acute Rehab OT Goals Patient Stated Goal: home today OT Goal Formulation: With patient Time For Goal Achievement: 01/05/23 Potential to Achieve Goals: Good ADL Goals Pt Will Perform Upper Body Dressing: with supervision;sitting Pt Will Perform Lower Body Dressing: with supervision;sitting/lateral leans;sit to/from stand Pt Will Transfer to Toilet: with supervision;ambulating;regular height toilet Pt Will Perform Tub/Shower Transfer: Tub transfer;Shower transfer;with supervision;ambulating Additional ADL Goal #1: pt will follow 3 step command in prep for ADLs  Plan All goals met and education completed, patient discharged from OT services;Discharge plan needs to be updated    Co-evaluation                 AM-PAC OT "6 Clicks" Daily Activity     Outcome Measure   Help from another person eating meals?: None Help from another person taking care of personal grooming?: A Little Help from another person toileting, which includes using toliet, bedpan, or urinal?: A Little Help from another person bathing (including washing, rinsing, drying)?: A Little Help from another person to put on and taking off regular upper body clothing?: A Little Help from another person to put on and taking off regular lower body clothing?: A Little 6 Click Score: 19    End of Session Equipment Utilized During Treatment: Rolling walker (2 wheels);Gait belt  OT Visit Diagnosis: Unsteadiness on feet (R26.81);Other abnormalities of gait and mobility (R26.89);Muscle weakness (generalized) (M62.81);History of falling (Z91.81)   Activity Tolerance Patient tolerated treatment well   Patient Left in bed;with call bell/phone within reach;with bed alarm set   Nurse Communication          Time: [phone] OT Time Calculation (min): 19 min  Charges: OT General Charges $OT Visit: 1 Visit OT  Treatments $Therapeutic Activity: 8-22 mins  Malachy Chamber, OTR/L Acute Rehab Services Office: [phone]   Layla Maw 12/29/2022, 9:39 AM

## 2022-12-29 NOTE — Discharge Summary (Addendum)
Physician Discharge Summary   Patient: [name] MRN: [id number] DOB: 07-11-1946  Admit date:     12/21/2022  Discharge date: 12/29/22  Discharge Physician: Tawni Millers   PCP: [place]   Recommendations at discharge:    Added hydralazine, isosorbide for afterload reduction and amlodipine for blood pressure control.  Decreased carvedilol dose due to bradycardia.  Continue furosemide 20 mg as needed for signs of volume overload, 2 to 3 lbs weigh increase in 24 hrs or 5 lbs in 7 days. Follow up with [facility] center in 7 to 10 days. Follow up renal function and electrolytes in 7 days,  Follow up with Cardiology as scheduled.   Discharge Diagnoses: Principal Problem:   Acute on chronic systolic CHF (congestive heart failure) (HCC) Active Problems:   CAD (coronary artery disease)   Essential hypertension   CKD (chronic kidney disease) stage 4, GFR 15-29 ml/min (HCC)   Type 2 diabetes mellitus with hyperlipidemia (HCC)   History of CVA (cerebrovascular accident)  Resolved Problems:   * No resolved hospital problems. [facility] Course: [name] was admitted to the hospital with the working diagnosis of heart failure exacerbation.   77 yo male with the past medical history of hypertension, T2DM, coronary artery disease, hx of CVA, BPH and carotid stenosis with presented with dyspnea. Reported rapid worsening of dyspnea. Last night he had PND and was note able to catch his breath. EMS was called and he was found hypertensive with systolic in the [id number] and hypoxemic with 02 saturation 47% on room air. He received nitroglycerin SL, placed on Cpap and transported to the ED. In the emergency room he was placed on Bipap, for respiratory distress, blood pressure 132/74, HR 68, RR 19 and 02 saturation 100%, lungs with rales but no wheezing, heart with S1 and S2 present and rhythmic, abdomen with no distention, and no lower extremity edema.   Na 135, K  5.1 CL 105 bicarbonate 23, glucose 125 bun 33 cr 2,24  BNP 719 High sensitive troponin 20, 87 and 474  Lactic acid 3,2  Wbc 14.7 hgb 10,9 plt 211  Sars covid 19 negative   Chest radiograph with mild cardiomegaly with bilateral hilar vascular congestion and cephalization of the vasculature.  Sternotomy wires in place.   EKG 92 bpm, left axis deviation, interventricular conduction delay, not  qtc prolongation, sinus rhythm with q wave V1 and V2 with no significant ST segment or T wave changes.   03/22 echocardiogram with new wall motion abnormalities.  Volume status has improved.  Consulted cardiology.  03/23 cardiac catheterization with no elevation in filling pressures.  Plan for cardiac nuclear stress test inpatient.  03/24 clinically stable, pending stress test possible tomorrow.  03/25 stress test and renal artery doppler US part of the workup.  03/26 stress test with intermediate risk and no renal artery stenosis.  03/27 patient will be discharged home and follow up as outpatient.   Assessment and Plan: * Acute on chronic systolic CHF (congestive heart failure) (HCC) Follow up echocardiogram with reduced LV systolic function with EF 35 to 40%, entire inferior wall, basal infero lateral segment, and apical septal segment are akinetic. The entire anterior wall, antero lateral wall and mid distal lateral wall, anterior septum mid inferoseptal, basal infero septal segment and apex are akinetic. No significant valvular disease.   03/22 cardiac catheterization  PA 22/11 (11). PCWP 2 Cardiac output/ index+ 4.5 and 2.4  PVR 2.0 wu  Patient received furosemide for diuresis, negative fluid balance was achieved, - 4,965, with significant improvement in his symptoms.   Started on hydralazine and isosorbide for after load reduction.  Carvedilol for B blockade.  Resume loop diuretic at the time of discharge.    CAD (coronary artery disease) Sp CABG, with ischemic heart disease.   Patient with chest pain at home along with dyspnea. No active chest pain during his hospirtalization.  Echocardiogram with new wall motion abnormalities anterior wall akinetic and hypokinetic.   Plan to continue medical therapy with aspirin, clopidogrel and atorvastatin. Stress test with intermediate risk, no active angina.  Likely will continue medical therapy over invasive testing due to low GFR.   Essential hypertension Continue blood pressure control with carvedilol, hydralazine and isosorbide.   Renal artery doppler US: Right patent renal artery with no significant evidence of stenosis at the proximal, mid and distal levels.  Left patent renal artery with no evidence of significant stenosis at the proximal, mid and distal levels.   Consider low dose ARB as outpatient.   CKD (chronic kidney disease) stage 4, GFR 15-29 ml/min (HCC) AKI,   A the time of his discharge his renal function has been stable for the last 48 hrs with serum cr at 2,22 with K at 4,1 and serum bicarbonate at 19. Na 139 and BUN 53   Continue blood pressure control.  As needed furosemide for diuresis.  Follow up renal function as outpatient.   Anemia of chronic renal disease with iron deficiency.  Continue oral iron supplementation.  Discharge hgb is 9.8   Type 2 diabetes mellitus with hyperlipidemia ([place]) His glucose remained stable during his hospitalization.  Continue with statin therapy.   History of CVA (cerebrovascular accident) Continue blood pressure control, statin and dual antiplatelet therapy.          Consultants: cardiology  Procedures performed: cardiac catheterization   Disposition: Home Diet recommendation:  Cardiac and Carb modified diet DISCHARGE MEDICATION: Allergies as of 12/29/2022       Reactions   Aspirin Other (See Comments)   GI REACTION   Donepezil Nausea And Vomiting   Galantamine Other (See Comments)   Feeling agitated   Memantine Other (See Comments)    Feeling agitated   Omeprazole Other (See Comments)   Indigestion   Terazosin Other (See Comments)   Low blood pressure        Medication List     STOP taking these medications    lisinopril 20 MG tablet Commonly known as: ZESTRIL       TAKE these medications    acetaminophen 325 MG tablet Commonly known as: TYLENOL Take 650 mg by mouth every 8 (eight) hours as needed for moderate pain.   amLODipine 5 MG tablet Commonly known as: NORVASC Take 1 tablet (5 mg total) by mouth daily. Start taking on: December 30, 2022   aspirin 81 MG chewable tablet Chew 81 mg by mouth daily.   atorvastatin 40 MG tablet Commonly known as: LIPITOR Take 20 mg by mouth at bedtime.   carvedilol 3.125 MG tablet Commonly known as: COREG Take 1 tablet (3.125 mg total) by mouth 2 (two) times daily with a meal. What changed:  medication strength how much to take   clopidogrel 75 MG tablet Commonly known as: PLAVIX Take 1 tablet (75 mg total) by mouth daily.   cyanocobalamin 1000 MCG/ML injection Commonly known as: VITAMIN B12 Inject 1,000 mcg into the skin every 30 (thirty) days.   famotidine  20 MG tablet Commonly known as: PEPCID Take 20 mg by mouth every other day.   ferrous gluconate 324 MG tablet Commonly known as: FERGON Take 324 mg by mouth every Monday, Wednesday, and Friday.   finasteride 5 MG tablet Commonly known as: PROSCAR Take 5 mg by mouth daily.   FLUoxetine 40 MG capsule Commonly known as: PROZAC Take 40 mg by mouth daily.   furosemide 20 MG tablet Commonly known as: Lasix Take 1 tablet (20 mg total) by mouth daily as needed for edema or fluid. Take in case of weight gain 2 to 3 lbs in 24 hrs or 5 lbs in 7 days. What changed:  when to take this reasons to take this additional instructions   gabapentin 300 MG capsule Commonly known as: NEURONTIN Take 300 mg by mouth in the morning and at bedtime.   hydrALAZINE 25 MG tablet Commonly known as:  APRESOLINE Take 1 tablet (25 mg total) by mouth every 8 (eight) hours.   hydrocerin Crea Apply 1 application topically 3 (three) times daily as needed (dry legs/dry skin).   isosorbide mononitrate 30 MG 24 hr tablet Commonly known as: IMDUR Take 1 tablet (30 mg total) by mouth daily. Start taking on: December 30, 2022   lactobacilus acidophilus & bulgar Tabs chewable tablet Take 1 tablet by mouth daily.   loperamide 2 MG tablet Commonly known as: IMODIUM A-D Take 2 mg by mouth 4 (four) times daily as needed for diarrhea or loose stools.   mirtazapine 15 MG tablet Commonly known as: REMERON Take 45 mg by mouth at bedtime.   pantoprazole 40 MG tablet Commonly known as: PROTONIX Take 1 tablet (40 mg total) by mouth daily before breakfast.   rivastigmine 9.5 mg/24hr Commonly known as: EXELON APPLY 1 PATCH EXTERNALLY TO THE SKIN EVERY DAY AS DIRECTED What changed:  how much to take how to take this when to take this additional instructions        [facility]-up [facility] [place] [facility].   Specialty: Cardiology Why: 01/10/23 at 3:30 PM  [facility] at [facility], [place] will see Dr. Clayborne Dana APP Contact information: [address] [id number] [place] Follow up.   Specialty: General Practice Why: Please follow up in a week. Contact information: [address] [place] [address] [phone]                Discharge Exam: Danley Danker Weights   12/28/22 0049 12/28/22 0600 12/29/22 0002  Weight: 76.3 kg 76.3 kg 74.6 kg   BP 119/64 (BP Location: Left Arm)   Pulse (!) 55   Temp 97.9 F (36.6 C) (Oral)   Resp 18   Ht 5\' 9"  (1.753 m)   Wt 74.6 kg   SpO2 99%   BMI 24.29 kg/m   Patient is feeling well, no chest pain or dyspnea, no pnd or orthopnea  Neurology awake and alert ENT with mild  pallor Cardiovascular with S1 and S2 present and regular with no gallops, rubs or murmurs No JVD  No lower extremity edema Respiratory with no rales or wheezing Abdomen with no distention   Condition at discharge: stable  The results of significant diagnostics from this hospitalization (including imaging, microbiology, ancillary and laboratory) are listed below for reference.   Imaging Studies: NM Myocar Multi W/Spect W/Wall Motion /  EF  Result Date: 12/27/2022 CLINICAL DATA:  Systolic heart failure. EXAM: MYOCARDIAL IMAGING WITH SPECT (REST AND PHARMACOLOGIC-STRESS) GATED LEFT VENTRICULAR WALL MOTION STUDY LEFT VENTRICULAR EJECTION FRACTION TECHNIQUE: Standard myocardial SPECT imaging was performed after resting intravenous injection of 10.7 mCi Tc-7m tetrofosmin. Subsequently, intravenous infusion of Lexiscan was performed under the supervision of the Cardiology staff. At peak effect of the drug, 31.4 mCi Tc-50m tetrofosmin was injected intravenously and standard myocardial SPECT imaging was performed. Quantitative gated imaging was also performed to evaluate left ventricular wall motion, and estimate left ventricular ejection fraction. COMPARISON:  None Available. FINDINGS: Perfusion: This study is relatively count poor diminishing exam detail. Small in size, mild in severity reversible defect is identified involving the mid anterior and apical anterior segments. Moderate in size, mild in severity fixed defect is noted involving the basal inferior and mid inferior segments. Wall Motion: There is diffusely diminished left ventricular wall motion with paradoxical motion of the septum identified. Moderate left ventricular systolic dilatation. Left Ventricular Ejection Fraction: 39 % End diastolic volume [id number] ml End systolic volume 85 ml IMPRESSION: 1. Small in size, mild in severity reversible defect is noted involving the mid anterior and apical anterior segments. Moderate fixed defect noted involving  the basal inferior and mid inferior segments. 2. Moderate left ventricular systolic dilatation with diff diffusely diminished left ventricular wall motion and paradoxical motion of the septum. 3. Left ventricular ejection fraction 39% 4. Non invasive risk stratification*: Intermediate *2012 Appropriate Use Criteria for Coronary Revascularization Focused Update: J Am Coll Cardiol. [id number]. http://content.airportbarriers.com.aspx?articleid=1201161 Electronically Signed   By: Kerby Moors M.D.   On: 12/27/2022 16:35   VAS US RENAL ARTERY DUPLEX  Result Date: 12/27/2022 ABDOMINAL VISCERAL Patient Name:  [name]  Date of Exam:   12/27/2022 Medical Rec #: [id number]        Accession #:    [id number] Date of Birth: [birth year]        Patient Gender: M Patient Age:   77 years Exam Location:  [facility] Procedure:      VAS US RENAL ARTERY DUPLEX Referring Phys: [id number] DANIEL R BENSIMHON -------------------------------------------------------------------------------- Indications: Severe right renal atrophy is noted on renal US 12/25/22. Right              kidney 7.6 cm, left 11.3 cm. High Risk Factors: Hypertension, coronary artery disease. Other Factors: CABG, history of CKD. Vascular Interventions: History of aorta endograft. Limitations: Air/bowel gas. Performing Technologist: Oda Cogan RDMS, RVT  Examination Guidelines: A complete evaluation includes B-mode imaging, spectral Doppler, color Doppler, and power Doppler as needed of all accessible portions of each vessel. Bilateral testing is considered an integral part of a complete examination. Limited examinations for reoccurring indications may be performed as noted.  Duplex Findings: +--------------------+--------+--------+------+----------------+ Mesenteric          PSV cm/sEDV cm/sPlaque    Comments     +--------------------+--------+--------+------+----------------+ Aorta Prox             72                 Patent  endograft +--------------------+--------+--------+------+----------------+ Celiac Artery Origin                       Not visualized  +--------------------+--------+--------+------+----------------+ SMA Proximal                               Not visualized  +--------------------+--------+--------+------+----------------+ SMA Mid  165                                  +--------------------+--------+--------+------+----------------+    +------------------+--------+--------+----------------------+ Right Renal ArteryPSV cm/sEDV cm/s       Comment         +------------------+--------+--------+----------------------+ Origin                            Not clearly visualized +------------------+--------+--------+----------------------+ Proximal             63      16                          +------------------+--------+--------+----------------------+ Mid                  68      19                          +------------------+--------+--------+----------------------+ Distal               66      16                          +------------------+--------+--------+----------------------+ +-----------------+--------+--------+----------------------+ Left Renal ArteryPSV cm/sEDV cm/s       Comment         +-----------------+--------+--------+----------------------+ Origin                           Not clearly visualized +-----------------+--------+--------+----------------------+ Proximal            99      22                          +-----------------+--------+--------+----------------------+ Mid                109      25                          +-----------------+--------+--------+----------------------+ Distal             100      31                          +-----------------+--------+--------+----------------------+  Technologist observations: Bilateral kidneys appear within normal limits. Right kidney - 5.6 x 6.0 x 5.0 mid to upper pole  +------------+--------+--------+----+-----------+--------+--------+----+ Right KidneyPSV cm/sEDV cm/sRI  Left KidneyPSV cm/sEDV cm/sRI   +------------+--------+--------+----+-----------+--------+--------+----+ Upper Pole                      Upper Pole 135     25      0.82 +------------+--------+--------+----+-----------+--------+--------+----+ Mid         30      11      0.64Mid        28      8       0.71 +------------+--------+--------+----+-----------+--------+--------+----+ Lower Pole  27      13      0.53Lower Pole 48      12      0.73 +------------+--------+--------+----+-----------+--------+--------+----+ Hilar       38      8       0.79Hilar  40      12      0.69 +------------+--------+--------+----+-----------+--------+--------+----+ +------------------+-----+------------------+-----+ Right Kidney           Left Kidney             +------------------+-----+------------------+-----+ RAR                    RAR                     +------------------+-----+------------------+-----+ RAR (manual)      1.1  RAR (manual)      1.3   +------------------+-----+------------------+-----+ Cortex                 Cortex                  +------------------+-----+------------------+-----+ Cortex thickness       Corex thickness         +------------------+-----+------------------+-----+ Kidney length (cm)11.50Kidney length (cm)11.20 +------------------+-----+------------------+-----+  Summary: Renal:  Right: Patent right renal artery without evidence of significant        stenosis at the proximal, mid and distal levels. Origin of        the renal artery was not clearly visualized. Renal size        appears within normal limits on this exam. Renal cyst was        noted. Measurement listed above. Left:  Patent left renal artery without evidence of significant        stenosis at the proximal , mid and distal levels. Origin of        the renal  artery was not clearly visualized. Abnormal left        resistive index. Renal size appears normal.  *See table(s) above for measurements and observations.  Diagnosing physician: Deitra Mayo MD  Electronically signed by Deitra Mayo MD on 12/27/2022 at 2:03:23 PM.    Final    US RENAL  Result Date: 12/25/2022 CLINICAL DATA:  Acute kidney injury. EXAM: RENAL / URINARY TRACT ULTRASOUND COMPLETE COMPARISON:  CT scan of September 23, 2022. FINDINGS: Right Kidney: Renal measurements: 7.6 x 4.3 x 3.7 cm = volume: 63 mL. Echogenicity of renal parenchyma within normal limits. 5.4 cm simple cyst is seen in upper pole. No mass or hydronephrosis visualized. Left Kidney: Renal measurements: 11.3 x 6.0 x 4.6 cm = volume: 135 mL. Echogenicity within normal limits. No mass or hydronephrosis visualized. Bladder: Appears normal for degree of bladder distention. Other: None. IMPRESSION: Severe right renal atrophy is noted. No hydronephrosis or renal obstruction is noted. Electronically Signed   By: Marijo Conception M.D.   On: 12/25/2022 14:56   CARDIAC CATHETERIZATION  Result Date: 12/24/2022 Findings: RA = 0 (-4 with inspiration) RV = 25/2 PA = 22/1 (11) PCW = 2 Fick cardiac output/index = 4.5/2.4 Thermo CO/CI = 3.5/1.8 PVR = 2.0 FA sat = 99% PA sat = 59%,57% After 350cc fluid challenge RA = 0 RV = 27/3 PA  = 27/3 (12) PCWP = 4 Thermo CO/CI 4.1/2.2 Assessment: 1. Marked volume depletion with low cardiac output. Mildly improved with volume challenge Glori Bickers, MD 2:56 PM  ECHOCARDIOGRAM LIMITED  Result Date: 12/24/2022    ECHOCARDIOGRAM LIMITED REPORT   Patient Name:   [name] Date of Exam: 12/24/2022 Medical Rec #:  [id number]       Height:       69.0 in Accession #:    [id number]      Weight:  161.1 lb Date of Birth:  August 30, 1946       BSA:          1.885 m Patient Age:    56 years        BP:           132/55 mmHg Patient Gender: M               HR:           67 bpm. Exam Location:   Inpatient Procedure: 2D Echo, Color Doppler and Intracardiac Opacification Agent Indications:    CHF  History:        Patient has prior history of Echocardiogram examinations, most                 recent 12/23/2022. Aortic Valve Disease; Risk                 Factors:Hypertension, Diabetes and Dyslipidemia.  Sonographer:    Melissa Morford RDCS (AE, PE) Referring Phys: [id number] DANIEL R BENSIMHON IMPRESSIONS  1. Limited echo for LV function and wall motion  2. Left ventricular ejection fraction, by estimation, is 35 to 40%. Left ventricular ejection fraction by 2D MOD biplane is 35.9 %. The left ventricle has moderately decreased function. The left ventricle demonstrates regional wall motion abnormalities (see scoring diagram/findings for description). There is moderate left ventricular hypertrophy. There is severe akinesis of the left ventricular, mid-apical anteroseptal wall, anterior wall and apical segment. There is severe akinesis of the left ventricular, basal-mid inferior wall.  3. The aortic valve is calcified. Aortic valve regurgitation is not visualized. Aortic valve sclerosis/calcification is present, without any evidence of aortic stenosis. Comparison(s): Changes from prior study are noted. 12/23/2022: LVEF 35-40%. FINDINGS  Left Ventricle: Left ventricular ejection fraction, by estimation, is 35 to 40%. Left ventricular ejection fraction by 2D MOD biplane is 35.9 %. The left ventricle has moderately decreased function. The left ventricle demonstrates regional wall motion abnormalities. Severe akinesis of the left ventricular, mid-apical anteroseptal wall, anterior wall and apical segment. Severe akinesis of the left ventricular, basal-mid inferior wall. Definity contrast agent was given IV to delineate the left ventricular endocardial borders. The left ventricular internal cavity size was normal in size. There is moderate left ventricular hypertrophy. Aortic Valve: The aortic valve is calcified. Aortic valve  regurgitation is not visualized. Aortic valve sclerosis/calcification is present, without any evidence of aortic stenosis. Aortic valve mean gradient measures 4.0 mmHg. Aortic valve peak gradient measures 4.2 mmHg. Aortic valve area, by VTI measures 2.18 cm. LEFT VENTRICLE PLAX 2D                        Biplane EF (MOD) LVIDd:         5.10 cm         LV Biplane EF:   Left LVIDs:         4.40 cm                          ventricular LV PW:         1.40 cm                          ejection LV IVS:        1.40 cm  fraction by LVOT diam:     2.10 cm                          2D MOD LV SV:         47                               biplane is LV SV Index:   25                               35.9 %. LVOT Area:     3.46 cm  LV Volumes (MOD) LV vol d, MOD    157.5 ml A2C: LV vol d, MOD    141.1 ml A4C: LV vol s, MOD    83.6 ml A2C: LV vol s, MOD    105.6 ml A4C: LV SV MOD A2C:   73.9 ml LV SV MOD A4C:   141.1 ml LV SV MOD BP:    54.9 ml LEFT ATRIUM         Index LA diam:    4.00 cm 2.12 cm/m  AORTIC VALVE AV Area (Vmax):    2.71 cm AV Area (Vmean):   1.96 cm AV Area (VTI):     2.18 cm AV Vmax:           102.10 cm/s AV Vmean:          93.700 cm/s AV VTI:            0.214 m AV Peak Grad:      4.2 mmHg AV Mean Grad:      4.0 mmHg LVOT Vmax:         79.90 cm/s LVOT Vmean:        52.900 cm/s LVOT VTI:          0.135 m LVOT/AV VTI ratio: 0.63  SHUNTS Systemic VTI:  0.14 m Systemic Diam: 2.10 cm Lyman Bishop MD Electronically signed by Lyman Bishop MD Signature Date/Time: 12/24/2022/2:16:35 PM    Final    ECHOCARDIOGRAM LIMITED  Result Date: 12/23/2022    ECHOCARDIOGRAM LIMITED REPORT   Patient Name:   [name] Date of Exam: 12/23/2022 Medical Rec #:  [id number]       Height:       69.0 in Accession #:    [id number]      Weight:       165.3 lb Date of Birth:  [birth year]       BSA:          1.906 m Patient Age:    81 years        BP:           132/55 mmHg Patient Gender: M               HR:            55 bpm. Exam Location:  Inpatient Procedure: Intracardiac Opacification Agent, Cardiac Doppler, Color Doppler and            2D Echo Indications:    Dyspnea  History:        Patient has prior history of Echocardiogram examinations, most                 recent 09/26/2022. CHF, CAD, Signs/Symptoms:Fatigue; Risk  Factors:Hypertension, Diabetes and Hyperlipidemia.  Sonographer:    Marella Chimes Referring Phys: [id number] [place]  1. Left ventricular ejection fraction, by estimation, is 35 to 40%. The left ventricle has moderately decreased function. The left ventricle demonstrates regional wall motion abnormalities (see scoring diagram/findings for description).  2. Right ventricular systolic function was not well visualized. The right ventricular size is not well visualized.  3. The mitral valve is normal in structure. No evidence of mitral valve regurgitation. No evidence of mitral stenosis.  4. The aortic valve is calcified. FINDINGS  Left Ventricle: Left ventricular ejection fraction, by estimation, is 35 to 40%. The left ventricle has moderately decreased function. The left ventricle demonstrates regional wall motion abnormalities.  LV Wall Scoring: The entire inferior wall, basal inferolateral segment, and apical septal segment are akinetic. The entire anterior wall, antero-lateral wall, mid and distal lateral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

## 2023-01-10 ENCOUNTER — Ambulatory Visit (HOSPITAL_COMMUNITY)
Admit: 2023-01-10 | Discharge: 2023-01-10 | Disposition: A | Discharge status: Home / Self Care | Payer: No Typology Code available for payment source | Attending: Family Medicine | Admitting: Family Medicine

## 2023-01-10 ENCOUNTER — Encounter (HOSPITAL_COMMUNITY): Payer: Self-pay

## 2023-01-10 VITALS — BP 154/70 | HR 72 | Wt 169.8 lb

## 2023-01-10 DIAGNOSIS — J449 Chronic obstructive pulmonary disease, unspecified: Secondary | ICD-10-CM | POA: Diagnosis not present

## 2023-01-10 DIAGNOSIS — N179 Acute kidney failure, unspecified: Secondary | ICD-10-CM | POA: Insufficient documentation

## 2023-01-10 DIAGNOSIS — I130 Hypertensive heart and chronic kidney disease with heart failure and stage 1 through stage 4 chronic kidney disease, or unspecified chronic kidney disease: Secondary | ICD-10-CM | POA: Diagnosis not present

## 2023-01-10 DIAGNOSIS — Z951 Presence of aortocoronary bypass graft: Secondary | ICD-10-CM | POA: Diagnosis not present

## 2023-01-10 DIAGNOSIS — I2510 Atherosclerotic heart disease of native coronary artery without angina pectoris: Secondary | ICD-10-CM | POA: Insufficient documentation

## 2023-01-10 DIAGNOSIS — N261 Atrophy of kidney (terminal): Secondary | ICD-10-CM | POA: Insufficient documentation

## 2023-01-10 DIAGNOSIS — F4310 Post-traumatic stress disorder, unspecified: Secondary | ICD-10-CM | POA: Insufficient documentation

## 2023-01-10 DIAGNOSIS — I10 Essential (primary) hypertension: Secondary | ICD-10-CM | POA: Diagnosis not present

## 2023-01-10 DIAGNOSIS — I5032 Chronic diastolic (congestive) heart failure: Secondary | ICD-10-CM | POA: Insufficient documentation

## 2023-01-10 DIAGNOSIS — Z79899 Other long term (current) drug therapy: Secondary | ICD-10-CM | POA: Insufficient documentation

## 2023-01-10 DIAGNOSIS — Z7902 Long term (current) use of antithrombotics/antiplatelets: Secondary | ICD-10-CM | POA: Insufficient documentation

## 2023-01-10 DIAGNOSIS — N184 Chronic kidney disease, stage 4 (severe): Secondary | ICD-10-CM | POA: Insufficient documentation

## 2023-01-10 DIAGNOSIS — E1151 Type 2 diabetes mellitus with diabetic peripheral angiopathy without gangrene: Secondary | ICD-10-CM | POA: Diagnosis not present

## 2023-01-10 DIAGNOSIS — Z955 Presence of coronary angioplasty implant and graft: Secondary | ICD-10-CM | POA: Insufficient documentation

## 2023-01-10 DIAGNOSIS — Z7982 Long term (current) use of aspirin: Secondary | ICD-10-CM | POA: Diagnosis not present

## 2023-01-10 DIAGNOSIS — E1122 Type 2 diabetes mellitus with diabetic chronic kidney disease: Secondary | ICD-10-CM | POA: Diagnosis not present

## 2023-01-10 DIAGNOSIS — Z8673 Personal history of transient ischemic attack (TIA), and cerebral infarction without residual deficits: Secondary | ICD-10-CM | POA: Diagnosis not present

## 2023-01-10 DIAGNOSIS — I5022 Chronic systolic (congestive) heart failure: Secondary | ICD-10-CM | POA: Diagnosis not present

## 2023-01-10 LAB — BASIC METABOLIC PANEL
Anion gap: 10 (ref 5–15)
BUN: 37 mg/dL — ABNORMAL HIGH (ref 8–23)
CO2: 24 mmol/L (ref 22–32)
Calcium: 9.1 mg/dL (ref 8.9–10.3)
Chloride: 106 mmol/L (ref 98–111)
Creatinine, Ser: 1.79 mg/dL — ABNORMAL HIGH (ref 0.61–1.24)
GFR, Estimated: 38 mL/min — ABNORMAL LOW (ref >60)
Glucose, Bld: 123 mg/dL — ABNORMAL HIGH (ref 70–99)
Potassium: 4 mmol/L (ref 3.5–5.1)
Sodium: 140 mmol/L (ref 135–145)

## 2023-01-10 LAB — BRAIN NATRIURETIC PEPTIDE: B Natriuretic Peptide: 644.6 pg/mL — ABNORMAL HIGH (ref 0.0–100.0)

## 2023-01-10 MED ORDER — FUROSEMIDE 20 MG PO TABS: 20.0000 mg | ORAL_TABLET | ORAL | 11 refills | Status: DC

## 2023-01-10 NOTE — Patient Instructions (Addendum)
CHANGE Lasix to 20 mg twice a week on Monday and Friday -may take an additional tablets as needed for 3lb weight gain overnight or 5 lb weight gain in one week  CHANGE 3RD dose of Hydralazine to be taken at 9 pm, if morning blood pressure is greater than 150 consistently double the night dose to 50 mg at 9pm  Labs today We will only contact you if something comes back abnormal or we need to make some changes. Otherwise no news is good news!  Your physician recommends that you schedule a follow-up appointment in: 2 months  in the Advanced Practitioners (PA/NP) Clinic    Do the following things EVERYDAY: Weigh yourself in the morning before breakfast. Write it down and keep it in a log. Take your medicines as prescribed Eat low salt foods--Limit salt (sodium) to 2000 mg per day.  Stay as active as you can everyday Limit all fluids for the day to less than 2 liters  At the [facility], you and your health needs are our priority. As part of our continuing mission to provide you with exceptional heart care, we have created designated Provider Care Teams. These Care Teams include your primary Cardiologist (physician) and Advanced Practice Providers (APPs- Physician Assistants and Nurse Practitioners) who all work together to provide you with the care you need, when you need it.   You may see any of the following providers on your designated Care Team at your next follow up: Dr Arvilla Meres Dr Marca Ancona Dr. Marcos Eke, NP Robbie Lis, [place] [facility] [place], [place] Brynda Peon, NP Karle Plumber, PharmD   Please be sure to bring in all your medications bottles to every appointment.    Thank you for choosing [facility]
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
## 2023-01-17 ENCOUNTER — Encounter (HOSPITAL_COMMUNITY): Payer: No Typology Code available for payment source

## 2023-02-03 ENCOUNTER — Emergency Department (HOSPITAL_COMMUNITY): Payer: No Typology Code available for payment source

## 2023-02-03 ENCOUNTER — Encounter (HOSPITAL_COMMUNITY): Payer: Self-pay

## 2023-02-03 ENCOUNTER — Emergency Department (HOSPITAL_COMMUNITY)
Admission: EM | Admit: 2023-02-03 | Discharge: 2023-02-03 | Disposition: A | Discharge status: Home / Self Care | Payer: No Typology Code available for payment source | Attending: Emergency Medicine | Admitting: Emergency Medicine

## 2023-02-03 ENCOUNTER — Other Ambulatory Visit: Payer: Self-pay

## 2023-02-03 DIAGNOSIS — Z7982 Long term (current) use of aspirin: Secondary | ICD-10-CM | POA: Diagnosis not present

## 2023-02-03 DIAGNOSIS — I130 Hypertensive heart and chronic kidney disease with heart failure and stage 1 through stage 4 chronic kidney disease, or unspecified chronic kidney disease: Secondary | ICD-10-CM | POA: Diagnosis not present

## 2023-02-03 DIAGNOSIS — R0602 Shortness of breath: Secondary | ICD-10-CM | POA: Insufficient documentation

## 2023-02-03 DIAGNOSIS — Z79899 Other long term (current) drug therapy: Secondary | ICD-10-CM | POA: Diagnosis not present

## 2023-02-03 DIAGNOSIS — E1122 Type 2 diabetes mellitus with diabetic chronic kidney disease: Secondary | ICD-10-CM | POA: Diagnosis not present

## 2023-02-03 DIAGNOSIS — I5020 Unspecified systolic (congestive) heart failure: Secondary | ICD-10-CM | POA: Diagnosis not present

## 2023-02-03 DIAGNOSIS — Z951 Presence of aortocoronary bypass graft: Secondary | ICD-10-CM | POA: Insufficient documentation

## 2023-02-03 DIAGNOSIS — N189 Chronic kidney disease, unspecified: Secondary | ICD-10-CM | POA: Diagnosis not present

## 2023-02-03 DIAGNOSIS — I2510 Atherosclerotic heart disease of native coronary artery without angina pectoris: Secondary | ICD-10-CM | POA: Diagnosis not present

## 2023-02-03 DIAGNOSIS — J449 Chronic obstructive pulmonary disease, unspecified: Secondary | ICD-10-CM | POA: Diagnosis not present

## 2023-02-03 DIAGNOSIS — Z7984 Long term (current) use of oral hypoglycemic drugs: Secondary | ICD-10-CM | POA: Diagnosis not present

## 2023-02-03 DIAGNOSIS — R079 Chest pain, unspecified: Secondary | ICD-10-CM | POA: Insufficient documentation

## 2023-02-03 LAB — CBC WITH DIFFERENTIAL/PLATELET
Abs Immature Granulocytes: 0.03 10*3/uL (ref 0.00–0.07)
Basophils Absolute: 0 10*3/uL (ref 0.0–0.1)
Basophils Relative: 0 %
Eosinophils Absolute: 0.2 10*3/uL (ref 0.0–0.5)
Eosinophils Relative: 2 %
HCT: 31.4 % — ABNORMAL LOW (ref 39.0–52.0)
Hemoglobin: 9.9 g/dL — ABNORMAL LOW (ref 13.0–17.0)
Immature Granulocytes: 0 %
Lymphocytes Relative: 18 %
Lymphs Abs: 1.3 10*3/uL (ref 0.7–4.0)
MCH: 29.8 pg (ref 26.0–34.0)
MCHC: 31.5 g/dL (ref 30.0–36.0)
MCV: 94.6 fL (ref 80.0–100.0)
Monocytes Absolute: 0.6 10*3/uL (ref 0.1–1.0)
Monocytes Relative: 8 %
Neutro Abs: 5 10*3/uL (ref 1.7–7.7)
Neutrophils Relative %: 72 %
Platelets: 173 10*3/uL (ref 150–400)
RBC: 3.32 MIL/uL — ABNORMAL LOW (ref 4.22–5.81)
RDW: 13.4 % (ref 11.5–15.5)
WBC: 7.1 10*3/uL (ref 4.0–10.5)
nRBC: 0 % (ref 0.0–0.2)

## 2023-02-03 LAB — COMPREHENSIVE METABOLIC PANEL
ALT: 13 U/L (ref 0–44)
AST: 15 U/L (ref 15–41)
Albumin: 3.3 g/dL — ABNORMAL LOW (ref 3.5–5.0)
Alkaline Phosphatase: 82 U/L (ref 38–126)
Anion gap: 9 (ref 5–15)
BUN: 41 mg/dL — ABNORMAL HIGH (ref 8–23)
CO2: 22 mmol/L (ref 22–32)
Calcium: 8.9 mg/dL (ref 8.9–10.3)
Chloride: 109 mmol/L (ref 98–111)
Creatinine, Ser: 2.12 mg/dL — ABNORMAL HIGH (ref 0.61–1.24)
GFR, Estimated: 31 mL/min — ABNORMAL LOW (ref >60)
Glucose, Bld: 147 mg/dL — ABNORMAL HIGH (ref 70–99)
Potassium: 4.3 mmol/L (ref 3.5–5.1)
Sodium: 140 mmol/L (ref 135–145)
Total Bilirubin: 0.6 mg/dL (ref 0.3–1.2)
Total Protein: 6.2 g/dL — ABNORMAL LOW (ref 6.5–8.1)

## 2023-02-03 LAB — TROPONIN I (HIGH SENSITIVITY)
Troponin I (High Sensitivity): 64 ng/L — ABNORMAL HIGH (ref <18)
Troponin I (High Sensitivity): 64 ng/L — ABNORMAL HIGH (ref <18)

## 2023-02-03 LAB — BRAIN NATRIURETIC PEPTIDE: B Natriuretic Peptide: 1083.6 pg/mL — ABNORMAL HIGH (ref 0.0–100.0)

## 2023-02-03 LAB — LIPASE, BLOOD: Lipase: 31 U/L (ref 11–51)

## 2023-02-03 NOTE — ED Triage Notes (Signed)
C/o centralized cp radiating to right arm with sob, headache, dizziness after eating breakfast 1hr PTA.  Pain worse when laying down.  Denies swelling or increase in weight.  81MG  ASA this am.  Hx CHF and CABG x3 in 2012

## 2023-02-03 NOTE — ED Provider Notes (Signed)
[place] EMERGENCY DEPARTMENT AT [facility] Provider Note   CSN: [id number] Arrival date & time: 02/03/23  4098     History  Chief Complaint  Patient presents with   Chest Pain    [name] is a 77 y.o. male w/ hx of systolic HF, CAD s/p CABG x 3 in 2012, HTN, DM2, carotid stenosis s/p carotid stenting, AAA s/p biiliac stent grafting, CKD, atresia of right kidney, COPD, presenting to the ED with abdominal bloating and lower chest discomfort.  Pt's wife provides supplemental history.  They report patient had an unusual diet yesterday, eating for lunch/dinner TWO tuna sandwiches, and a PB&J sandwich, with crackers.  He says he felt "bloated" afterwards and this feeling persistent all night.  He could not sleep because he could not get comfortable, and reports lower left sided and epigastric discomfort all night.  This morning his wife made him [employer] breakfast and he says he was struggling to swallow the potatos because "I was choking on them and felt a lot of pressure."  Denies fevers, chills, SOB, lightheadedness, syncope, diaphoresis.  Reports regular BM today and passing gas.  Denies currently any abdominal pain, feels "bloating" has gone away, and reports very minimal chest discomfort.   Denies that his discomfort feels like anything experienced in the past.  His wife reports she gave him some of his BP meds this morning but not all.  HPI     Home Medications Prior to Admission medications   Medication Sig Start Date End Date Taking? Authorizing Provider  acetaminophen (TYLENOL) 325 MG tablet Take 650 mg by mouth every 8 (eight) hours as needed for moderate pain.    [provider]  amLODipine (NORVASC) 5 MG tablet Take 1 tablet (5 mg total) by mouth daily. 12/30/22 01/29/23  Arrien, York Ram, MD  aspirin 81 MG chewable tablet Chew 81 mg by mouth daily.    [provider]  atorvastatin (LIPITOR) 40 MG tablet Take 20 mg by mouth at  bedtime.    [provider]  carvedilol (COREG) 3.125 MG tablet Take 1 tablet (3.125 mg total) by mouth 2 (two) times daily with a meal. 12/29/22 01/28/23  Arrien, York Ram, MD  clopidogrel (PLAVIX) 75 MG tablet Take 1 tablet (75 mg total) by mouth daily. 06/28/13   Micki Riley, MD  cyanocobalamin (,VITAMIN B-12,) 1000 MCG/ML injection Inject 1,000 mcg into the skin every 30 (thirty) days. 05/15/13   [provider]  famotidine (PEPCID) 20 MG tablet Take 20 mg by mouth every other day.    [provider]  ferrous gluconate (FERGON) 324 MG tablet Take 324 mg by mouth every Monday, Wednesday, and Friday.    [provider]  finasteride (PROSCAR) 5 MG tablet Take 5 mg by mouth daily.  03/13/13   [provider]  FLUoxetine (PROZAC) 40 MG capsule Take 40 mg by mouth daily. 10/15/22   [provider]  furosemide (LASIX) 20 MG tablet Take 1 tablet (20 mg total) by mouth 2 (two) times a week. MONDAY AND FRIDAY-AS NEEDED FOR 3 LB WEIGHT GAIN OVERNIGHT OR 5 LB WEIGHT GAIN IN ONE WEEK 01/10/23   Bensimhon, Bevelyn Buckles, MD  gabapentin (NEURONTIN) 300 MG capsule Take 300 mg by mouth in the morning and at bedtime.    [provider]  hydrALAZINE (APRESOLINE) 25 MG tablet Take 1 tablet (25 mg total) by mouth every 8 (eight) hours. 12/29/22 01/28/23  Arrien, York Ram, MD  hydrocerin (EUCERIN) CREA Apply 1 application topically 3 (three) times daily as needed (dry legs/dry skin).    [provider]  isosorbide mononitrate (IMDUR) 30 MG 24 hr tablet Take 1 tablet (30 mg total) by mouth daily. 12/30/22 01/29/23  Arrien, York Ram, MD  lactobacilus acidophilus & bulgar [facility]) TABS chewable tablet Take 1 tablet by mouth daily. 10/15/22   [provider]  loperamide (IMODIUM A-D) 2 MG tablet Take 2 mg by mouth 4 (four) times daily as needed for diarrhea or loose stools.    [provider]  mirtazapine (REMERON) 15 MG  tablet Take 45 mg by mouth at bedtime.    [provider]  pantoprazole (PROTONIX) 40 MG tablet Take 1 tablet (40 mg total) by mouth daily before breakfast. 09/28/22   Kathlen Mody, MD  rivastigmine (EXELON) 9.5 mg/24hr APPLY 1 PATCH EXTERNALLY TO THE SKIN EVERY DAY AS DIRECTED Patient taking differently: Place 9.5 mg onto the skin daily. 10/27/15   Butch Penny, NP  metoprolol (LOPRESSOR) 50 MG tablet Take 0.5 tablets (25 mg total) by mouth 2 (two) times daily. 03/05/11 12/09/11  Rollene Rotunda, MD      Allergies    Aspirin, Donepezil, Galantamine, Memantine, Omeprazole, and Terazosin    Review of Systems   Review of Systems  Physical Exam Updated Vital Signs BP (!) 155/65   Pulse 60   Temp 98.9 F (37.2 C) (Oral)   Resp 16   Ht 5\' 9"  (1.753 m)   Wt 80.3 kg   SpO2 98%   BMI 26.14 kg/m  Physical Exam Constitutional:      General: He is not in acute distress. HENT:     Head: Normocephalic and atraumatic.  Eyes:     Conjunctiva/sclera: Conjunctivae normal.     Pupils: Pupils are equal, round, and reactive to light.  Cardiovascular:     Rate and Rhythm: Normal rate and regular rhythm.  Pulmonary:     Effort: Pulmonary effort is normal. No respiratory distress.  Abdominal:     General: There is no distension.     Tenderness: There is no abdominal tenderness.  Skin:    General: Skin is warm and dry.  Neurological:     General: No focal deficit present.     Mental Status: He is alert. Mental status is at baseline.  Psychiatric:        Mood and Affect: Mood normal.        Behavior: Behavior normal.     ED Results / Procedures / Treatments   Labs (all labs ordered are listed, but only abnormal results are displayed) Labs Reviewed  COMPREHENSIVE METABOLIC PANEL - Abnormal; Notable for the following components:      Result Value   Glucose, Bld 147 (*)    BUN 41 (*)    Creatinine, Ser 2.12 (*)    Total Protein 6.2 (*)    Albumin 3.3 (*)    GFR, Estimated 31  (*)    All other components within normal limits  CBC WITH DIFFERENTIAL/PLATELET - Abnormal; Notable for the following components:   RBC 3.32 (*)    Hemoglobin 9.9 (*)    HCT 31.4 (*)    All other components within normal limits  BRAIN NATRIURETIC PEPTIDE - Abnormal; Notable for the following components:   B Natriuretic Peptide 1,083.6 (*)    All other components within normal limits  TROPONIN I (HIGH SENSITIVITY) - Abnormal; Notable for the following components:   Troponin I (High Sensitivity)  64 (*)    All other components within normal limits  TROPONIN I (HIGH SENSITIVITY) - Abnormal; Notable for the following components:   Troponin I (High Sensitivity) 64 (*)    All other components within normal limits  LIPASE, BLOOD    EKG EKG Interpretation  Date/Time:  Thursday Feb 03 2023 08:15:19 EDT Ventricular Rate:  69 PR Interval:  221 QRS Duration: 123 QT Interval:  423 QTC Calculation: 454 R Axis:   -45 Text Interpretation: Sinus rhythm Prolonged PR interval Left bundle branch block No sig changes from prior tracing Confirmed by Alvester Chou [phone]) on 02/03/2023 8:31:58 AM  Radiology DG Chest 2 View  Result Date: 02/03/2023 CLINICAL DATA:  Central chest pain radiating into right arm with shortness of breath and dizziness. EXAM: CHEST - 2 VIEW COMPARISON:  12/21/2022 FINDINGS: Stable heart size dense post prior CABG. Lung volumes are low bilaterally. There may be a mild degree of underlying chronic pulmonary venous hypertension/interstitial edema. On the lateral view, there is suggestion a trace amount of posterior left pleural fluid. There is no evidence of overt pulmonary edema, consolidation, pneumothorax or mass. Visualized bony structures are unremarkable. IMPRESSION: Low lung volumes with possible mild degree of chronic pulmonary venous hypertension/interstitial edema. Suggestion of trace left pleural effusion. Electronically Signed   By: Irish Lack M.D.   On: 02/03/2023  09:34    Procedures Procedures    Medications Ordered in ED Medications - No data to display  ED Course/ Medical Decision Making/ A&P Clinical Course as of 02/03/23 1130  Thu Feb 03, 2023  1113 Delta troponins are flat.  Pt having no further chest discomfort.  I advised outpatient cardiology f/u, but I suspect this is most consistent with gastritis or dietary GI discomfort, and less concerned for ACS, PE [MT]    Clinical Course User Index [MT] Shawn Dannenberg, Kermit Balo, MD                             Medical Decision Making Amount and/or Complexity of Data Reviewed Labs: ordered. Radiology: ordered.   This patient presents to the ED with concern for epigastric lower chest discomfort, and abd bloating. This involves an extensive number of treatment options, and is a complaint that carries with it a high risk of complications and morbidity.  The differential diagnosis includes gastritis vs gerd vs gas pain vs atypical ACS vs other  Given his report of unusual dietary intake yesterday I suspect this may be reflux and gas related.  His abdominal bloating resolved.  +BM today and passing gas.  I have a lower suspicion for acute ileus, obstruction, or AAA complication, and do not see an emergent indication for CT imaging at this time.  Co-morbidities that complicate the patient evaluation: multiple CV risk factors, HTN, CAD, diabetes  Additional history obtained from patient's wife  External records from outside source obtained and reviewed including cardiology office evaluation Dr Teressa Lower 01/10/23 summarizing cardiac hx as noted above.  Recent hospitalizaiton 3/24 for acute pulm edema in setting of severe HTN with troponin elevations to 474, EF 35-40% with anterior wall AK.  Cr managed to 1.8 at most recent check on 01/10/23.  I ordered and personally interpreted labs.  The pertinent results include: Labs all June her baseline level, some mildly elevated but flat troponins and repeat.  BNP also  mildly elevated above baseline.  I ordered imaging studies including dg chest I independently visualized and interpreted imaging  which showed perhaps pulmonary edema pattern, no focal infiltrate I agree with the radiologist interpretation  The patient was maintained on a cardiac monitor.  I personally viewed and interpreted the cardiac monitored which showed an underlying rhythm of: NSR  Per my interpretation the patient's ECG shows chronic LBBB without acute ischemic findings  I have reviewed the patients home medicines and have made adjustments as needed  After the interventions noted above, I reevaluated the patient and found that they have: improved  Social Determinants of Health: discussed dietary modifications to avoid possible gastritis issues  Dispostion:  After consideration of the diagnostic results and the patients response to treatment, I feel that the patent would benefit from close outpatient follow-up.  At the time there is no indication emergently for hospitalization for congestive heart failure exacerbation, acute coronary syndrome, or concern for aortic dissection, pulmonary embolism, or other life-threatening causes of chest discomfort.  The patient is asymptomatic, stable on room air, and feeling well to go home.        Final Clinical Impression(s) / ED Diagnoses Final diagnoses:  Chest pain, unspecified type    Rx / DC Orders ED Discharge Orders     None         Terald Sleeper, MD 02/03/23 1131

## 2023-03-14 ENCOUNTER — Encounter (HOSPITAL_COMMUNITY): Payer: Self-pay

## 2023-03-14 ENCOUNTER — Ambulatory Visit (HOSPITAL_COMMUNITY)
Admission: RE | Admit: 2023-03-14 | Discharge: 2023-03-14 | Disposition: A | Discharge status: Home / Self Care | Payer: No Typology Code available for payment source | Source: Ambulatory Visit | Attending: Family Medicine | Admitting: Family Medicine

## 2023-03-14 VITALS — BP 148/72 | HR 63 | Wt 177.0 lb

## 2023-03-14 DIAGNOSIS — N183 Chronic kidney disease, stage 3 unspecified: Secondary | ICD-10-CM

## 2023-03-14 DIAGNOSIS — I130 Hypertensive heart and chronic kidney disease with heart failure and stage 1 through stage 4 chronic kidney disease, or unspecified chronic kidney disease: Secondary | ICD-10-CM | POA: Insufficient documentation

## 2023-03-14 DIAGNOSIS — I10 Essential (primary) hypertension: Secondary | ICD-10-CM

## 2023-03-14 DIAGNOSIS — E1122 Type 2 diabetes mellitus with diabetic chronic kidney disease: Secondary | ICD-10-CM | POA: Diagnosis not present

## 2023-03-14 DIAGNOSIS — J449 Chronic obstructive pulmonary disease, unspecified: Secondary | ICD-10-CM | POA: Diagnosis not present

## 2023-03-14 DIAGNOSIS — Z8673 Personal history of transient ischemic attack (TIA), and cerebral infarction without residual deficits: Secondary | ICD-10-CM | POA: Insufficient documentation

## 2023-03-14 DIAGNOSIS — Z7982 Long term (current) use of aspirin: Secondary | ICD-10-CM | POA: Diagnosis not present

## 2023-03-14 DIAGNOSIS — Z7984 Long term (current) use of oral hypoglycemic drugs: Secondary | ICD-10-CM | POA: Insufficient documentation

## 2023-03-14 DIAGNOSIS — Z7902 Long term (current) use of antithrombotics/antiplatelets: Secondary | ICD-10-CM | POA: Insufficient documentation

## 2023-03-14 DIAGNOSIS — Z79899 Other long term (current) drug therapy: Secondary | ICD-10-CM | POA: Diagnosis not present

## 2023-03-14 DIAGNOSIS — N1832 Chronic kidney disease, stage 3b: Secondary | ICD-10-CM

## 2023-03-14 DIAGNOSIS — Z951 Presence of aortocoronary bypass graft: Secondary | ICD-10-CM | POA: Insufficient documentation

## 2023-03-14 DIAGNOSIS — N184 Chronic kidney disease, stage 4 (severe): Secondary | ICD-10-CM | POA: Diagnosis not present

## 2023-03-14 DIAGNOSIS — Z955 Presence of coronary angioplasty implant and graft: Secondary | ICD-10-CM | POA: Diagnosis not present

## 2023-03-14 DIAGNOSIS — I5022 Chronic systolic (congestive) heart failure: Secondary | ICD-10-CM | POA: Diagnosis not present

## 2023-03-14 DIAGNOSIS — I2510 Atherosclerotic heart disease of native coronary artery without angina pectoris: Secondary | ICD-10-CM | POA: Diagnosis not present

## 2023-03-14 DIAGNOSIS — Z8774 Personal history of (corrected) congenital malformations of heart and circulatory system: Secondary | ICD-10-CM | POA: Insufficient documentation

## 2023-03-14 DIAGNOSIS — R0602 Shortness of breath: Secondary | ICD-10-CM | POA: Insufficient documentation

## 2023-03-14 DIAGNOSIS — F0394 Unspecified dementia, unspecified severity, with anxiety: Secondary | ICD-10-CM | POA: Insufficient documentation

## 2023-03-14 DIAGNOSIS — F0393 Unspecified dementia, unspecified severity, with mood disturbance: Secondary | ICD-10-CM | POA: Diagnosis not present

## 2023-03-14 DIAGNOSIS — Z8679 Personal history of other diseases of the circulatory system: Secondary | ICD-10-CM | POA: Insufficient documentation

## 2023-03-14 DIAGNOSIS — I739 Peripheral vascular disease, unspecified: Secondary | ICD-10-CM

## 2023-03-14 DIAGNOSIS — F4310 Post-traumatic stress disorder, unspecified: Secondary | ICD-10-CM | POA: Insufficient documentation

## 2023-03-14 DIAGNOSIS — I5032 Chronic diastolic (congestive) heart failure: Secondary | ICD-10-CM

## 2023-03-14 MED ORDER — FUROSEMIDE 20 MG PO TABS: 20.0000 mg | ORAL_TABLET | ORAL | 11 refills | Status: DC | PRN

## 2023-03-14 MED ORDER — ASPIRIN 81 MG PO CHEW: 81.0000 mg | CHEWABLE_TABLET | Freq: Every day | ORAL | 6 refills | Status: DC

## 2023-03-14 MED ORDER — EMPAGLIFLOZIN 25 MG PO TABS: 25.0000 mg | ORAL_TABLET | Freq: Every day | ORAL | 8 refills | Status: AC
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
## 2023-03-14 NOTE — Progress Notes (Signed)
ReDS Vest / Clip - 03/14/23 1500       ReDS Vest / Clip   Station Marker C    Ruler Value 28    ReDS Value Range Moderate volume overload    ReDS Actual Value 39

## 2023-03-14 NOTE — Progress Notes (Signed)
Medication Samples have been provided to the patient.  Drug name: Jardiance       Strength: 10 mg        Qty: 14  LOT: [id number]  Exp.Date: 12/2024  Dosing instructions: Patient takes 1 tablet by mouth daily.  The patient has been instructed regarding the correct time, dose, and frequency of taking this medication, including desired effects and most common side effects.   Novella Rob Deny Chevez 3:56 PM 03/14/2023

## 2023-03-14 NOTE — Patient Instructions (Addendum)
Thank you for coming in today  If you had labs drawn today, any labs that are abnormal the clinic will call you No news is good news  Medications: RESTART Aspirin 81 mg daily START Jardiance 12.5 mg daily You were given samples at your visit today Change lasix to 20 mg as needed for For weight gain of 3 lbs in 24 hours or 5 lbs in a week    Follow up appointments:  Your physician recommends that you schedule a follow-up appointment in:  2-3 months with Dr. Gala Romney    Do the following things EVERYDAY: Weigh yourself in the morning before breakfast. Write it down and keep it in a log. Take your medicines as prescribed Eat low salt foods--Limit salt (sodium) to 2000 mg per day.  Stay as active as you can everyday Limit all fluids for the day to less than 2 liters   At the [facility], you and your health needs are our priority. As part of our continuing mission to provide you with exceptional heart care, we have created designated Provider Care Teams. These Care Teams include your primary Cardiologist (physician) and Advanced Practice Providers (APPs- Physician Assistants and Nurse Practitioners) who all work together to provide you with the care you need, when you need it.   You may see any of the following providers on your designated Care Team at your next follow up: Dr Arvilla Meres Dr Marca Ancona Dr. Marcos Eke, NP Robbie Lis, [place] [facility] [place], [place] Brynda Peon, NP Karle Plumber, PharmD   Please be sure to bring in all your medications bottles to every appointment.    Thank you for choosing [facility]  If you have any questions or concerns before your next appointment please send [place] a message through [place] or call our office at [phone].    TO LEAVE A MESSAGE FOR THE NURSE SELECT OPTION 2, PLEASE LEAVE A MESSAGE INCLUDING: YOUR NAME DATE OF BIRTH CALL BACK  NUMBER REASON FOR CALL**this is important as we prioritize the call backs  YOU WILL RECEIVE A CALL BACK THE SAME DAY AS LONG AS YOU CALL BEFORE 4:00 PM

## 2023-03-16 ENCOUNTER — Encounter (HOSPITAL_COMMUNITY): Payer: Self-pay

## 2023-04-04 ENCOUNTER — Ambulatory Visit (HOSPITAL_COMMUNITY)
Admission: RE | Admit: 2023-04-04 | Discharge: 2023-04-04 | Disposition: A | Discharge status: Home / Self Care | Payer: No Typology Code available for payment source | Source: Ambulatory Visit | Attending: Cardiology | Admitting: Cardiology

## 2023-04-04 DIAGNOSIS — I5022 Chronic systolic (congestive) heart failure: Secondary | ICD-10-CM | POA: Diagnosis present

## 2023-04-04 LAB — BASIC METABOLIC PANEL
Anion gap: 7 (ref 5–15)
BUN: 32 mg/dL — ABNORMAL HIGH (ref 8–23)
CO2: 21 mmol/L — ABNORMAL LOW (ref 22–32)
Calcium: 8.4 mg/dL — ABNORMAL LOW (ref 8.9–10.3)
Chloride: 111 mmol/L (ref 98–111)
Creatinine, Ser: 2.31 mg/dL — ABNORMAL HIGH (ref 0.61–1.24)
GFR, Estimated: 28 mL/min — ABNORMAL LOW (ref >60)
Glucose, Bld: 162 mg/dL — ABNORMAL HIGH (ref 70–99)
Potassium: 4.1 mmol/L (ref 3.5–5.1)
Sodium: 139 mmol/L (ref 135–145)

## 2023-06-10 ENCOUNTER — Telehealth (HOSPITAL_COMMUNITY): Payer: Self-pay | Admitting: Cardiology

## 2023-06-10 NOTE — Telephone Encounter (Signed)
Community care referral updated and faxed

## 2023-07-25 ENCOUNTER — Telehealth (HOSPITAL_COMMUNITY): Payer: Self-pay | Admitting: Cardiology

## 2023-07-25 NOTE — Telephone Encounter (Signed)
Pts wife called to report 10 lb weight gain overnight last week.  Normal weight 171-weight today 181 SBP 200 Mild SOB Decreased UOP  Daughter is a PA and recommended increasing lasix to 40 mg daily- pt taking x 1 week  Wife reports she has loosened the reigns and has not kept up with fluid restrictions and Gus has eaten everything processed (ham, pepperoni)  Follow

## 2023-07-26 MED ORDER — POTASSIUM CHLORIDE CRYS ER 20 MEQ PO TBCR: 20.0000 meq | EXTENDED_RELEASE_TABLET | Freq: Every day | ORAL | 3 refills | Status: DC

## 2023-07-26 MED ORDER — FUROSEMIDE 20 MG PO TABS: 40.0000 mg | ORAL_TABLET | Freq: Every day | ORAL | 11 refills | Status: DC

## 2023-07-26 MED ORDER — HYDRALAZINE HCL 50 MG PO TABS: 50.0000 mg | ORAL_TABLET | Freq: Three times a day (TID) | ORAL | 3 refills | Status: DC

## 2023-07-26 NOTE — Telephone Encounter (Signed)
Pt aware via wife 

## 2023-08-02 ENCOUNTER — Ambulatory Visit (HOSPITAL_COMMUNITY)
Admission: RE | Admit: 2023-08-02 | Discharge: 2023-08-02 | Disposition: A | Discharge status: Home / Self Care | Payer: Non-veteran care | Source: Ambulatory Visit | Attending: Internal Medicine | Admitting: Internal Medicine

## 2023-08-02 ENCOUNTER — Encounter (HOSPITAL_COMMUNITY): Payer: Self-pay | Admitting: Internal Medicine

## 2023-08-02 VITALS — BP 132/62 | HR 52 | Wt 181.8 lb

## 2023-08-02 DIAGNOSIS — I10 Essential (primary) hypertension: Secondary | ICD-10-CM

## 2023-08-02 DIAGNOSIS — N1832 Chronic kidney disease, stage 3b: Secondary | ICD-10-CM | POA: Insufficient documentation

## 2023-08-02 DIAGNOSIS — N184 Chronic kidney disease, stage 4 (severe): Secondary | ICD-10-CM

## 2023-08-02 DIAGNOSIS — E1122 Type 2 diabetes mellitus with diabetic chronic kidney disease: Secondary | ICD-10-CM | POA: Insufficient documentation

## 2023-08-02 DIAGNOSIS — Z87891 Personal history of nicotine dependence: Secondary | ICD-10-CM | POA: Insufficient documentation

## 2023-08-02 DIAGNOSIS — I130 Hypertensive heart and chronic kidney disease with heart failure and stage 1 through stage 4 chronic kidney disease, or unspecified chronic kidney disease: Secondary | ICD-10-CM | POA: Insufficient documentation

## 2023-08-02 DIAGNOSIS — Z951 Presence of aortocoronary bypass graft: Secondary | ICD-10-CM | POA: Insufficient documentation

## 2023-08-02 DIAGNOSIS — F4310 Post-traumatic stress disorder, unspecified: Secondary | ICD-10-CM | POA: Insufficient documentation

## 2023-08-02 DIAGNOSIS — J449 Chronic obstructive pulmonary disease, unspecified: Secondary | ICD-10-CM | POA: Insufficient documentation

## 2023-08-02 DIAGNOSIS — F0390 Unspecified dementia without behavioral disturbance: Secondary | ICD-10-CM | POA: Insufficient documentation

## 2023-08-02 DIAGNOSIS — I2510 Atherosclerotic heart disease of native coronary artery without angina pectoris: Secondary | ICD-10-CM | POA: Insufficient documentation

## 2023-08-02 DIAGNOSIS — Z8673 Personal history of transient ischemic attack (TIA), and cerebral infarction without residual deficits: Secondary | ICD-10-CM | POA: Insufficient documentation

## 2023-08-02 DIAGNOSIS — E1151 Type 2 diabetes mellitus with diabetic peripheral angiopathy without gangrene: Secondary | ICD-10-CM | POA: Insufficient documentation

## 2023-08-02 DIAGNOSIS — I5022 Chronic systolic (congestive) heart failure: Secondary | ICD-10-CM | POA: Insufficient documentation

## 2023-08-02 LAB — BRAIN NATRIURETIC PEPTIDE: B Natriuretic Peptide: 489.7 pg/mL — ABNORMAL HIGH (ref 0.0–100.0)

## 2023-08-02 LAB — BASIC METABOLIC PANEL
Anion gap: 11 (ref 5–15)
BUN: 30 mg/dL — ABNORMAL HIGH (ref 8–23)
CO2: 25 mmol/L (ref 22–32)
Calcium: 8.5 mg/dL — ABNORMAL LOW (ref 8.9–10.3)
Chloride: 104 mmol/L (ref 98–111)
Creatinine, Ser: 2.62 mg/dL — ABNORMAL HIGH (ref 0.61–1.24)
GFR, Estimated: 24 mL/min — ABNORMAL LOW (ref >60)
Glucose, Bld: 108 mg/dL — ABNORMAL HIGH (ref 70–99)
Potassium: 4 mmol/L (ref 3.5–5.1)
Sodium: 140 mmol/L (ref 135–145)

## 2023-08-02 MED ORDER — POTASSIUM CHLORIDE CRYS ER 20 MEQ PO TBCR: 20.0000 meq | EXTENDED_RELEASE_TABLET | Freq: Every day | ORAL | 3 refills | Status: AC

## 2023-08-02 NOTE — Patient Instructions (Signed)
There has been no changes to your medications.  Labs done today, your results will be available in MyChart, we will contact you for abnormal readings.  Your physician recommends that you schedule a follow-up appointment in: 6 months.  If you have any questions or concerns before your next appointment please send [place] a message through [place] or call our office at [phone].    TO LEAVE A MESSAGE FOR THE NURSE SELECT OPTION 2, PLEASE LEAVE A MESSAGE INCLUDING: YOUR NAME DATE OF BIRTH CALL BACK NUMBER REASON FOR CALL**this is important as we prioritize the call backs  YOU WILL RECEIVE A CALL BACK THE SAME DAY AS LONG AS YOU CALL BEFORE 4:00 PM  At the [facility], you and your health needs are our priority. As part of our continuing mission to provide you with exceptional heart care, we have created designated Provider Care Teams. These Care Teams include your primary Cardiologist (physician) and Advanced Practice Providers (APPs- Physician Assistants and Nurse Practitioners) who all work together to provide you with the care you need, when you need it.   You may see any of the following providers on your designated Care Team at your next follow up: Dr Arvilla Meres Dr Marca Ancona Dr. Dorthula Nettles Dr. Clearnce Hasten Amy Filbert Schilder, NP Robbie Lis, [place] [facility] [place], [place] Brynda Peon, NP [place] Lee, NP Karle Plumber, PharmD   Please be sure to bring in all your medications bottles to every appointment.    Thank you for choosing [facility]
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
## 2023-08-02 NOTE — Addendum Note (Signed)
Encounter addended by: Linda Hedges, RN on: 08/02/2023 10:29 AM  Actions taken: Order list changed, Diagnosis association updated, Clinical Note Signed, Charge Capture section accepted

## 2023-08-08 ENCOUNTER — Other Ambulatory Visit (HOSPITAL_COMMUNITY): Payer: Self-pay | Admitting: Cardiology

## 2023-08-08 MED ORDER — HYDRALAZINE HCL 50 MG PO TABS: 50.0000 mg | ORAL_TABLET | Freq: Three times a day (TID) | ORAL | 3 refills | Status: AC

## 2024-01-30 ENCOUNTER — Telehealth (HOSPITAL_COMMUNITY): Payer: Self-pay
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
## 2024-01-30 NOTE — Telephone Encounter (Signed)
 Called to confirm/remind patient of their appointment at the [facility] on 01/31/24.   Appointment:   [] Confirmed  [x] Left mess   [] No answer/No voice mail  [] VM Full/unable to leave message  [] Phone not in service  And to bring in all medications and/or complete list.

## 2024-01-31 ENCOUNTER — Encounter (HOSPITAL_COMMUNITY): Payer: Self-pay

## 2024-01-31 ENCOUNTER — Ambulatory Visit (HOSPITAL_COMMUNITY)
Admission: RE | Admit: 2024-01-31 | Discharge: 2024-01-31 | Disposition: A | Discharge status: Home / Self Care | Payer: Non-veteran care | Source: Ambulatory Visit | Attending: Cardiology | Admitting: Cardiology

## 2024-01-31 VITALS — BP 152/62 | HR 55 | Wt 178.6 lb

## 2024-01-31 DIAGNOSIS — F0390 Unspecified dementia without behavioral disturbance: Secondary | ICD-10-CM | POA: Insufficient documentation

## 2024-01-31 DIAGNOSIS — Z7902 Long term (current) use of antithrombotics/antiplatelets: Secondary | ICD-10-CM | POA: Insufficient documentation

## 2024-01-31 DIAGNOSIS — Z8774 Personal history of (corrected) congenital malformations of heart and circulatory system: Secondary | ICD-10-CM | POA: Insufficient documentation

## 2024-01-31 DIAGNOSIS — J449 Chronic obstructive pulmonary disease, unspecified: Secondary | ICD-10-CM | POA: Insufficient documentation

## 2024-01-31 DIAGNOSIS — Z87891 Personal history of nicotine dependence: Secondary | ICD-10-CM | POA: Insufficient documentation

## 2024-01-31 DIAGNOSIS — I130 Hypertensive heart and chronic kidney disease with heart failure and stage 1 through stage 4 chronic kidney disease, or unspecified chronic kidney disease: Secondary | ICD-10-CM | POA: Insufficient documentation

## 2024-01-31 DIAGNOSIS — Z8673 Personal history of transient ischemic attack (TIA), and cerebral infarction without residual deficits: Secondary | ICD-10-CM | POA: Insufficient documentation

## 2024-01-31 DIAGNOSIS — E1122 Type 2 diabetes mellitus with diabetic chronic kidney disease: Secondary | ICD-10-CM | POA: Insufficient documentation

## 2024-01-31 DIAGNOSIS — N184 Chronic kidney disease, stage 4 (severe): Secondary | ICD-10-CM

## 2024-01-31 DIAGNOSIS — I2510 Atherosclerotic heart disease of native coronary artery without angina pectoris: Secondary | ICD-10-CM | POA: Insufficient documentation

## 2024-01-31 DIAGNOSIS — I10 Essential (primary) hypertension: Secondary | ICD-10-CM

## 2024-01-31 DIAGNOSIS — F4310 Post-traumatic stress disorder, unspecified: Secondary | ICD-10-CM | POA: Insufficient documentation

## 2024-01-31 DIAGNOSIS — N1832 Chronic kidney disease, stage 3b: Secondary | ICD-10-CM

## 2024-01-31 DIAGNOSIS — Z79899 Other long term (current) drug therapy: Secondary | ICD-10-CM | POA: Insufficient documentation

## 2024-01-31 DIAGNOSIS — Z8679 Personal history of other diseases of the circulatory system: Secondary | ICD-10-CM | POA: Insufficient documentation

## 2024-01-31 DIAGNOSIS — I5022 Chronic systolic (congestive) heart failure: Secondary | ICD-10-CM | POA: Insufficient documentation

## 2024-01-31 DIAGNOSIS — N1831 Chronic kidney disease, stage 3a: Secondary | ICD-10-CM | POA: Insufficient documentation

## 2024-01-31 DIAGNOSIS — Z86711 Personal history of pulmonary embolism: Secondary | ICD-10-CM | POA: Insufficient documentation

## 2024-01-31 DIAGNOSIS — Z7984 Long term (current) use of oral hypoglycemic drugs: Secondary | ICD-10-CM | POA: Insufficient documentation

## 2024-01-31 DIAGNOSIS — Z955 Presence of coronary angioplasty implant and graft: Secondary | ICD-10-CM | POA: Insufficient documentation

## 2024-01-31 DIAGNOSIS — Z951 Presence of aortocoronary bypass graft: Secondary | ICD-10-CM | POA: Insufficient documentation

## 2024-01-31 LAB — BASIC METABOLIC PANEL WITH GFR
Anion gap: 9 (ref 5–15)
BUN: 36 mg/dL — ABNORMAL HIGH (ref 8–23)
CO2: 24 mmol/L (ref 22–32)
Calcium: 8.5 mg/dL — ABNORMAL LOW (ref 8.9–10.3)
Chloride: 106 mmol/L (ref 98–111)
Creatinine, Ser: 2.47 mg/dL — ABNORMAL HIGH (ref 0.61–1.24)
GFR, Estimated: 26 mL/min — ABNORMAL LOW (ref >60)
Glucose, Bld: 95 mg/dL (ref 70–99)
Potassium: 4.1 mmol/L (ref 3.5–5.1)
Sodium: 139 mmol/L (ref 135–145)

## 2024-01-31 LAB — BRAIN NATRIURETIC PEPTIDE: B Natriuretic Peptide: 519.6 pg/mL — ABNORMAL HIGH (ref 0.0–100.0)

## 2024-01-31 MED ORDER — FUROSEMIDE 20 MG PO TABS: 40.0000 mg | ORAL_TABLET | Freq: Every day | ORAL | 11 refills | Status: AC

## 2024-01-31 MED ORDER — ISOSORBIDE MONONITRATE ER 60 MG PO TB24: 60.0000 mg | ORAL_TABLET | Freq: Every day | ORAL | 5 refills | Status: AC

## 2024-01-31 NOTE — Progress Notes (Signed)
 ReDS Vest / Clip - 01/31/24 1000       ReDS Vest / Clip   Station Marker C    Ruler Value 29    ReDS Value Range Moderate volume overload    ReDS Actual Value 37

## 2024-01-31 NOTE — Patient Instructions (Signed)
 Medication Changes:  STOP AMLODIPINE    INCREASE ISOSORBIDE  TO 60MG  ONCE DAILY   RESTART LASIX  (FUROSEMIDE ) AT 40MG  ONCE DAILY   Lab Work:  Labs done today, your results will be available in MyChart, we will contact you for abnormal readings   Follow-Up in: 3 MONTHS WITH DR. Julane Ny WITH ECHO ON THE SAME DAY PLEASE CALL OUR OFFICE AROUND LATE MAY TO GET SCHEDULED FOR YOUR APPOINTMENT. PHONE NUMBER IS [phone] OPTION 2   At the [facility], you and your health needs are our priority. We have a designated team specialized in the treatment of Heart Failure. This Care Team includes your primary Heart Failure Specialized Cardiologist (physician), Advanced Practice Providers (APPs- Physician Assistants and Nurse Practitioners), and Pharmacist who all work together to provide you with the care you need, when you need it.   You may see any of the following providers on your designated Care Team at your next follow up:  Dr. Jules Oar Dr. Peder Bourdon Dr. Alwin Baars Dr. Judyth Nunnery Nieves Bars, NP Ruddy Corral, [place] [facility] [place], [place] Dennise Fitz, NP [place] Lee, NP Luster Salters, PharmD   Please be sure to bring in all your medications bottles to every appointment.   Need to Contact Us :  If you have any questions or concerns before your next appointment please send us  a message through [place] or call our office at [phone].    TO LEAVE A MESSAGE FOR THE NURSE SELECT OPTION 2, PLEASE LEAVE A MESSAGE INCLUDING: YOUR NAME DATE OF BIRTH CALL BACK NUMBER REASON FOR CALL**this is important as we prioritize the call backs  YOU WILL RECEIVE A CALL BACK THE SAME DAY AS LONG AS YOU CALL BEFORE 4:00 PM

## 2024-02-10 ENCOUNTER — Ambulatory Visit (HOSPITAL_COMMUNITY)
Admission: RE | Admit: 2024-02-10 | Discharge: 2024-02-10 | Disposition: A | Discharge status: Home / Self Care | Source: Ambulatory Visit | Attending: Internal Medicine | Admitting: Internal Medicine

## 2024-02-10 DIAGNOSIS — I5022 Chronic systolic (congestive) heart failure: Secondary | ICD-10-CM | POA: Insufficient documentation

## 2024-02-10 LAB — BASIC METABOLIC PANEL WITH GFR
Anion gap: 9 (ref 5–15)
BUN: 23 mg/dL (ref 8–23)
CO2: 26 mmol/L (ref 22–32)
Calcium: 9 mg/dL (ref 8.9–10.3)
Chloride: 105 mmol/L (ref 98–111)
Creatinine, Ser: 2.31 mg/dL — ABNORMAL HIGH (ref 0.61–1.24)
GFR, Estimated: 28 mL/min — ABNORMAL LOW (ref >60)
Glucose, Bld: 98 mg/dL (ref 70–99)
Potassium: 4.3 mmol/L (ref 3.5–5.1)
Sodium: 140 mmol/L (ref 135–145)

## 2024-02-10 LAB — BRAIN NATRIURETIC PEPTIDE: B Natriuretic Peptide: 535.2 pg/mL — ABNORMAL HIGH (ref 0.0–100.0)

## 2024-03-26 ENCOUNTER — Other Ambulatory Visit: Payer: Self-pay

## 2024-03-26 ENCOUNTER — Emergency Department (HOSPITAL_BASED_OUTPATIENT_CLINIC_OR_DEPARTMENT_OTHER)

## 2024-03-26 ENCOUNTER — Encounter (HOSPITAL_BASED_OUTPATIENT_CLINIC_OR_DEPARTMENT_OTHER): Payer: Self-pay

## 2024-03-26 ENCOUNTER — Emergency Department (HOSPITAL_BASED_OUTPATIENT_CLINIC_OR_DEPARTMENT_OTHER)
Admission: EM | Admit: 2024-03-26 | Discharge: 2024-03-26 | Disposition: A | Discharge status: Home / Self Care | Attending: Emergency Medicine | Admitting: Emergency Medicine

## 2024-03-26 DIAGNOSIS — R519 Headache, unspecified: Secondary | ICD-10-CM | POA: Insufficient documentation

## 2024-03-26 DIAGNOSIS — Z23 Encounter for immunization: Secondary | ICD-10-CM | POA: Insufficient documentation

## 2024-03-26 DIAGNOSIS — S20222A Contusion of left back wall of thorax, initial encounter: Secondary | ICD-10-CM | POA: Diagnosis not present

## 2024-03-26 DIAGNOSIS — Y92002 Bathroom of unspecified non-institutional (private) residence single-family (private) house as the place of occurrence of the external cause: Secondary | ICD-10-CM | POA: Insufficient documentation

## 2024-03-26 DIAGNOSIS — R109 Unspecified abdominal pain: Secondary | ICD-10-CM | POA: Diagnosis present

## 2024-03-26 DIAGNOSIS — J449 Chronic obstructive pulmonary disease, unspecified: Secondary | ICD-10-CM | POA: Diagnosis not present

## 2024-03-26 DIAGNOSIS — I5023 Acute on chronic systolic (congestive) heart failure: Secondary | ICD-10-CM | POA: Insufficient documentation

## 2024-03-26 DIAGNOSIS — W010XXA Fall on same level from slipping, tripping and stumbling without subsequent striking against object, initial encounter: Secondary | ICD-10-CM | POA: Insufficient documentation

## 2024-03-26 DIAGNOSIS — I2510 Atherosclerotic heart disease of native coronary artery without angina pectoris: Secondary | ICD-10-CM | POA: Diagnosis not present

## 2024-03-26 DIAGNOSIS — Z7984 Long term (current) use of oral hypoglycemic drugs: Secondary | ICD-10-CM | POA: Diagnosis not present

## 2024-03-26 DIAGNOSIS — Z87891 Personal history of nicotine dependence: Secondary | ICD-10-CM | POA: Insufficient documentation

## 2024-03-26 DIAGNOSIS — N184 Chronic kidney disease, stage 4 (severe): Secondary | ICD-10-CM | POA: Insufficient documentation

## 2024-03-26 LAB — COMPREHENSIVE METABOLIC PANEL WITH GFR
ALT: 9 U/L (ref 0–44)
AST: 14 U/L — ABNORMAL LOW (ref 15–41)
Albumin: 3.8 g/dL (ref 3.5–5.0)
Alkaline Phosphatase: 110 U/L (ref 38–126)
Anion gap: 12 (ref 5–15)
BUN: 29 mg/dL — ABNORMAL HIGH (ref 8–23)
CO2: 22 mmol/L (ref 22–32)
Calcium: 8.8 mg/dL — ABNORMAL LOW (ref 8.9–10.3)
Chloride: 105 mmol/L (ref 98–111)
Creatinine, Ser: 2.51 mg/dL — ABNORMAL HIGH (ref 0.61–1.24)
GFR, Estimated: 25 mL/min — ABNORMAL LOW (ref >60)
Glucose, Bld: 114 mg/dL — ABNORMAL HIGH (ref 70–99)
Potassium: 3.6 mmol/L (ref 3.5–5.1)
Sodium: 140 mmol/L (ref 135–145)
Total Bilirubin: 0.3 mg/dL (ref 0.0–1.2)
Total Protein: 6.2 g/dL — ABNORMAL LOW (ref 6.5–8.1)

## 2024-03-26 LAB — CBC WITH DIFFERENTIAL/PLATELET
Abs Immature Granulocytes: 0.01 10*3/uL (ref 0.00–0.07)
Basophils Absolute: 0 10*3/uL (ref 0.0–0.1)
Basophils Relative: 1 %
Eosinophils Absolute: 0.2 10*3/uL (ref 0.0–0.5)
Eosinophils Relative: 3 %
HCT: 33.4 % — ABNORMAL LOW (ref 39.0–52.0)
Hemoglobin: 10.8 g/dL — ABNORMAL LOW (ref 13.0–17.0)
Immature Granulocytes: 0 %
Lymphocytes Relative: 25 %
Lymphs Abs: 1.2 10*3/uL (ref 0.7–4.0)
MCH: 28.6 pg (ref 26.0–34.0)
MCHC: 32.3 g/dL (ref 30.0–36.0)
MCV: 88.6 fL (ref 80.0–100.0)
Monocytes Absolute: 0.4 10*3/uL (ref 0.1–1.0)
Monocytes Relative: 10 %
Neutro Abs: 2.9 10*3/uL (ref 1.7–7.7)
Neutrophils Relative %: 61 %
Platelets: 193 10*3/uL (ref 150–400)
RBC: 3.77 MIL/uL — ABNORMAL LOW (ref 4.22–5.81)
RDW: 13.6 % (ref 11.5–15.5)
WBC: 4.6 10*3/uL (ref 4.0–10.5)
nRBC: 0 % (ref 0.0–0.2)

## 2024-03-26 MED ORDER — TETANUS-DIPHTH-ACELL PERTUSSIS 5-2.5-18.5 LF-MCG/0.5 IM SUSY
0.5000 mL | PREFILLED_SYRINGE | Freq: Once | INTRAMUSCULAR | Status: AC
  Administered 2024-03-26: 0.5 mL via INTRAMUSCULAR
  Filled 2024-03-26: qty 0.5

## 2024-03-26 MED ORDER — OXYCODONE-ACETAMINOPHEN 5-325 MG PO TABS
1.0000 | ORAL_TABLET | Freq: Once | ORAL | Status: AC
  Administered 2024-03-26: 1 via ORAL
  Filled 2024-03-26: qty 1

## 2024-03-26 NOTE — ED Triage Notes (Signed)
 In for eval of slip and fall this morning. Skin tears to left lower leg and back pain. Takes Plavix . Denies hitting head.
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
## 2024-03-26 NOTE — Discharge Instructions (Signed)
 While you were in the emergency room, you had scans done of your head, abdomen pelvis and back, and x-ray of your shin.  These test were all normal.  Your blood work done that was at your baseline.  You do not have any fractures.  You will likely be sore over the next couple of days.  You can take 1000 mg of Tylenol  every 8 hours.  Follow-up with your primary care doctor.

## 2024-06-11 ENCOUNTER — Telehealth (HOSPITAL_COMMUNITY): Payer: Self-pay | Admitting: Vascular Surgery

## 2024-06-11 NOTE — Telephone Encounter (Signed)
 4th attempt to move 9/18 appt w/ DB W/ ECHO

## 2024-06-21 ENCOUNTER — Encounter (HOSPITAL_COMMUNITY): Admitting: Internal Medicine

## 2024-06-21 ENCOUNTER — Ambulatory Visit (HOSPITAL_COMMUNITY): Admission: RE | Admit: 2024-06-21 | Source: Ambulatory Visit

## 2024-08-09 ENCOUNTER — Telehealth (HOSPITAL_COMMUNITY): Payer: Self-pay | Admitting: Internal Medicine

## 2024-08-09 NOTE — Telephone Encounter (Signed)
 Called to confirm/remind patient of their appointment at the [facility] on 08/09/24.   Appointment:   [] Confirmed  [x] Left mess   [] No answer/No voice mail  [] VM Full/unable to leave message  [] Phone not in service  Patient reminded to bring all medications and/or complete list.  Confirmed patient has transportation. Gave directions, instructed to utilize valet parking.

## 2024-08-10 ENCOUNTER — Ambulatory Visit (HOSPITAL_BASED_OUTPATIENT_CLINIC_OR_DEPARTMENT_OTHER)
Admission: RE | Admit: 2024-08-10 | Discharge: 2024-08-10 | Disposition: A | Discharge status: Home / Self Care | Payer: Self-pay | Source: Ambulatory Visit | Attending: Internal Medicine | Admitting: Internal Medicine

## 2024-08-10 ENCOUNTER — Encounter (HOSPITAL_COMMUNITY): Payer: Self-pay | Admitting: Internal Medicine

## 2024-08-10 ENCOUNTER — Ambulatory Visit (HOSPITAL_COMMUNITY)
Admission: RE | Admit: 2024-08-10 | Discharge: 2024-08-10 | Disposition: A | Discharge status: Home / Self Care | Source: Ambulatory Visit | Attending: Cardiology | Admitting: Cardiology

## 2024-08-10 VITALS — BP 120/68 | HR 89 | Ht 69.0 in | Wt 179.6 lb

## 2024-08-10 DIAGNOSIS — Z955 Presence of coronary angioplasty implant and graft: Secondary | ICD-10-CM | POA: Diagnosis not present

## 2024-08-10 DIAGNOSIS — Z79899 Other long term (current) drug therapy: Secondary | ICD-10-CM | POA: Diagnosis not present

## 2024-08-10 DIAGNOSIS — N184 Chronic kidney disease, stage 4 (severe): Secondary | ICD-10-CM | POA: Insufficient documentation

## 2024-08-10 DIAGNOSIS — Z86711 Personal history of pulmonary embolism: Secondary | ICD-10-CM | POA: Diagnosis not present

## 2024-08-10 DIAGNOSIS — E1122 Type 2 diabetes mellitus with diabetic chronic kidney disease: Secondary | ICD-10-CM | POA: Diagnosis not present

## 2024-08-10 DIAGNOSIS — I130 Hypertensive heart and chronic kidney disease with heart failure and stage 1 through stage 4 chronic kidney disease, or unspecified chronic kidney disease: Secondary | ICD-10-CM | POA: Diagnosis not present

## 2024-08-10 DIAGNOSIS — J449 Chronic obstructive pulmonary disease, unspecified: Secondary | ICD-10-CM | POA: Diagnosis not present

## 2024-08-10 DIAGNOSIS — I2510 Atherosclerotic heart disease of native coronary artery without angina pectoris: Secondary | ICD-10-CM | POA: Insufficient documentation

## 2024-08-10 DIAGNOSIS — Z7902 Long term (current) use of antithrombotics/antiplatelets: Secondary | ICD-10-CM | POA: Insufficient documentation

## 2024-08-10 DIAGNOSIS — R7989 Other specified abnormal findings of blood chemistry: Secondary | ICD-10-CM | POA: Insufficient documentation

## 2024-08-10 DIAGNOSIS — Z7984 Long term (current) use of oral hypoglycemic drugs: Secondary | ICD-10-CM | POA: Insufficient documentation

## 2024-08-10 DIAGNOSIS — Z8673 Personal history of transient ischemic attack (TIA), and cerebral infarction without residual deficits: Secondary | ICD-10-CM | POA: Insufficient documentation

## 2024-08-10 DIAGNOSIS — I5022 Chronic systolic (congestive) heart failure: Secondary | ICD-10-CM

## 2024-08-10 DIAGNOSIS — F0390 Unspecified dementia without behavioral disturbance: Secondary | ICD-10-CM | POA: Insufficient documentation

## 2024-08-10 DIAGNOSIS — I10 Essential (primary) hypertension: Secondary | ICD-10-CM | POA: Diagnosis not present

## 2024-08-10 DIAGNOSIS — Z8679 Personal history of other diseases of the circulatory system: Secondary | ICD-10-CM | POA: Insufficient documentation

## 2024-08-10 DIAGNOSIS — Z951 Presence of aortocoronary bypass graft: Secondary | ICD-10-CM | POA: Insufficient documentation

## 2024-08-10 DIAGNOSIS — Z8774 Personal history of (corrected) congenital malformations of heart and circulatory system: Secondary | ICD-10-CM | POA: Diagnosis not present

## 2024-08-10 DIAGNOSIS — F4310 Post-traumatic stress disorder, unspecified: Secondary | ICD-10-CM | POA: Insufficient documentation

## 2024-08-10 LAB — ECHOCARDIOGRAM COMPLETE
AR max vel: 2.8 cm2
AV Area VTI: 2.69 cm2
AV Area mean vel: 2.82 cm2
AV Mean grad: 5.5 mmHg
AV Peak grad: 10.2 mmHg
Ao pk vel: 1.6 m/s
Area-P 1/2: 3.08 cm2
Calc EF: 49.6 %
S' Lateral: 4.07 cm
Single Plane A2C EF: 44.9 %
Single Plane A4C EF: 52.7 %

## 2024-08-10 MED ORDER — FUROSEMIDE 40 MG PO TABS: 40.0000 mg | ORAL_TABLET | Freq: Every day | ORAL | Status: DC

## 2024-08-10 NOTE — Patient Instructions (Signed)
 There has been no changes to your medications.  Your physician recommends that you schedule a follow-up appointment in: 6 months.  If you have any questions or concerns before your next appointment please send us  a message through [facility] or call our office at [phone].    TO LEAVE A MESSAGE FOR THE NURSE SELECT OPTION 2, PLEASE LEAVE A MESSAGE INCLUDING: YOUR NAME DATE OF BIRTH CALL BACK NUMBER REASON FOR CALL**this is important as we prioritize the call backs  YOU WILL RECEIVE A CALL BACK THE SAME DAY AS LONG AS YOU CALL BEFORE 4:00 PM  At the [facility], you and your health needs are our priority. As part of our continuing mission to provide you with exceptional heart care, we have created designated Provider Care Teams. These Care Teams include your primary Cardiologist (physician) and Advanced Practice Providers (APPs- Physician Assistants and Nurse Practitioners) who all work together to provide you with the care you need, when you need it.   You may see any of the following providers on your designated Care Team at your next follow up: Dr Toribio Fuel Dr Ezra Shuck Dr. Morene Brownie Greig Mosses, NP Caffie Shed, [place] [facility] [place], [place] Beckey Coe, NP Jordan Lee, NP Ellouise Class, NP Tinnie Redman, PharmD Jaun Bash, PharmD   Please be sure to bring in all your medications bottles to every appointment.    Thank you for choosing [facility]
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
## 2024-08-10 NOTE — Progress Notes (Signed)
  Echocardiogram 2D Echocardiogram has been performed.  [name] 08/10/2024, 2:51 PM

## 2024-08-13 ENCOUNTER — Ambulatory Visit (HOSPITAL_COMMUNITY): Payer: Self-pay | Admitting: Cardiology

## 2024-08-17 ENCOUNTER — Encounter (HOSPITAL_COMMUNITY): Payer: Self-pay | Admitting: Internal Medicine

## 2024-08-17 ENCOUNTER — Other Ambulatory Visit (HOSPITAL_COMMUNITY)

## 2024-09-07 ENCOUNTER — Telehealth (HOSPITAL_COMMUNITY): Payer: Self-pay

## 2024-09-07 NOTE — Telephone Encounter (Signed)
 Received a fax requesting medical records from [facility], [facility].  Records were successfully faxed to: [phone] ,which was the number provided.. Medical request form will be scanned into patients chart.   Phone number: [phone]

## 2024-09-17 ENCOUNTER — Telehealth (HOSPITAL_COMMUNITY): Payer: Self-pay

## 2024-09-17 NOTE — Telephone Encounter (Signed)
 Received a fax requesting medical records from [facility]. Records were successfully faxed to: [phone] ,which was the number provided.. Medical request form will be scanned into patients chart.   Phone Number: [phone]

## 2025-02-07 ENCOUNTER — Ambulatory Visit (HOSPITAL_COMMUNITY)
